# Patient Record
Sex: Male | Born: 1975 | Hispanic: Yes | Marital: Single | Smoking: Never smoker
Health system: Southern US, Community
[De-identification: ages and names within clinical notes are randomized; demographics above are authoritative.]

## PROBLEM LIST (undated history)

## (undated) DIAGNOSIS — K219 Gastro-esophageal reflux disease without esophagitis: Secondary | ICD-10-CM

## (undated) DIAGNOSIS — Z5189 Encounter for other specified aftercare: Secondary | ICD-10-CM

## (undated) DIAGNOSIS — C801 Malignant (primary) neoplasm, unspecified: Secondary | ICD-10-CM

## (undated) HISTORY — DX: Encounter for other specified aftercare: Z51.89

## (undated) HISTORY — DX: Malignant (primary) neoplasm, unspecified: C80.1

## (undated) NOTE — *Deleted (*Deleted)
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 Mid to lower esophageal mass along an 8 cm segment, large endoluminal component, strongly favoring esophageal malignancy. Borderline enlarged AP window lymph node. No findings of distant metastatic disease. 2. Trace bilateral pleural effusions. 3. Diffuse hepatic steatosis. 4. Cholelithiasis. 5. Fatty spermatic cords likely due to small bilateral indirect inguinal hernias.   08/11/2019 Procedure   EGD by Dr Leone Payor  IMPRESSION - Partially obstructing, likely malignant esophageal tumor was found in the upper third of the esophagus and in the middle third of the esophagus. Biopsied. Very large and long - difficult but able to advance scope by this lesion - await CT for better length estimate - Normal stomach. - Normal examined duodenum.   08/11/2019 Initial Biopsy   FINAL MICROSCOPIC DIAGNOSIS:   A. ESOPHAGUS, UPPER, BIOPSY:  - Squamous cell carcinoma.    08/18/2019 Cancer Staging   Staging form: Esophagus - Squamous Cell Carcinoma, AJCC 8th Edition - Clinical: Stage Unknown (cTX, cN1, cM0) - Signed by Malachy Mood, MD on 08/18/2019   08/29/2019 -   Chemotherapy   Concurrent chemoradiation with weekly CT for 6 weeks starting 08/29/19    08/29/2019 -  Radiation Therapy   Concurrent chemoradiation with Dr Mitzi Hansen starting 08/29/19      CURRENT THERAPY:  PENDING EGD on 11/28/19  INTERVAL HISTORY: *** Jimmy Hawkins is here for a follow up. He presents to the clinic alone.    REVIEW OF SYSTEMS:  *** Constitutional: Denies fevers, chills or abnormal weight loss Eyes: Denies blurriness of vision Ears, nose, mouth, throat, and face: Denies mucositis or sore throat Respiratory: Denies cough, dyspnea or wheezes Cardiovascular: Denies palpitation, chest discomfort or lower extremity swelling Gastrointestinal:  Denies nausea, heartburn or change in bowel habits Skin: Denies abnormal skin rashes Lymphatics: Denies new lymphadenopathy or easy bruising Neurological:Denies numbness, tingling or new weaknesses Behavioral/Psych: Mood is stable, no new changes  All other systems were reviewed with the patient and are negative.  MEDICAL HISTORY:  Past Medical History:  Diagnosis Date  . GERD (gastroesophageal reflux disease)   . Hematemesis with nausea 08/10/2019    SURGICAL HISTORY: Past Surgical History:  Procedure Laterality Date  . BIOPSY  08/11/2019   Procedure: BIOPSY;  Surgeon: Iva Boop, MD;  Location: [facility] ENDOSCOPY;  Service: Endoscopy;;  . ESOPHAGOGASTRODUODENOSCOPY  08/11/2019  . ESOPHAGOGASTRODUODENOSCOPY (EGD) WITH PROPOFOL N/A 08/11/2019   Procedure: ESOPHAGOGASTRODUODENOSCOPY (EGD) WITH PROPOFOL;  Surgeon: Iva Boop, MD;  Location: [facility] ENDOSCOPY;  Service: Endoscopy;  Laterality: N/A;    I have reviewed the social history and family history with the patient and they are unchanged from previous note.  ALLERGIES:  has No Known Allergies.  MEDICATIONS:  Current Outpatient Medications  Medication Sig Dispense Refill  . Ferrous Sulfate (IRON) 325 (65 Fe) MG TABS Take 1 tablet (325 mg total) by mouth 2  (two) times daily. 30 tablet 1  . HYDROcodone-acetaminophen (HYCET) 7.5-325 mg/15 ml solution Take 15 mLs by mouth every 6 (six) hours as needed for moderate pain. 473 mL 0  . omeprazole (PRILOSEC) 40 MG capsule Take 1 capsule (40 mg total) by mouth daily. 30 capsule 2  . ondansetron (ZOFRAN) 8 MG tablet Take 1 tablet (8 mg total) by mouth 2 (two) times daily as needed for refractory nausea / vomiting. Start on day 3 after chemo. 30 tablet 1  . pantoprazole (PROTONIX) 40 MG tablet Take 1 tablet (40 mg total) by mouth 2 (two) times daily. 60 tablet 1  . prochlorperazine (COMPAZINE) 10 MG tablet TAKE 1 TABLET BY MOUTH EVERY 6 HOURS AS NEEDED FOR NAUSEA AND/OR VOMITING 30 tablet 0  . sucralfate (CARAFATE) 1 g tablet Take 1 tablet (1 g total) by mouth 4 (four) times daily. Dissolve each tablet in 15 cc water before use. 120 tablet 2   No current facility-administered medications for this visit.    PHYSICAL EXAMINATION: ECOG PERFORMANCE STATUS: {CHL ONC ECOG PS:[id number]}  There were no vitals filed for this visit. There were no vitals filed for this visit. *** GENERAL:alert, no distress and comfortable SKIN: skin color, texture, turgor are normal, no rashes or significant lesions EYES: normal, Conjunctiva are pink and non-injected, sclera clear {OROPHARYNX:no exudate, no erythema and lips, buccal mucosa, and tongue normal}  NECK: supple, thyroid normal size, non-tender, without nodularity LYMPH:  no palpable lymphadenopathy in the cervical, axillary {or inguinal} LUNGS: clear to auscultation and percussion with normal breathing effort HEART: regular rate & rhythm and no murmurs and no lower extremity edema ABDOMEN:abdomen soft, non-tender and normal bowel sounds Musculoskeletal:no cyanosis of digits and no clubbing  NEURO: alert & oriented x 3 with fluent speech, no focal motor/sensory deficits  LABORATORY DATA:  I have reviewed the data as listed CBC Latest Ref Rng & Units 10/17/2019  10/04/2019 09/26/2019  WBC 4.0 - 10.5 K/uL 2.3(L) 2.4(L) 2.0(L)  Hemoglobin 13.0 - 17.0 g/dL 10.6(L) 10.6(L) 10.9(L)  Hematocrit 39 - 52 % 31.5(L) 31.4(L) 32.9(L)  Platelets 150 - 400 K/uL 187 183 152     CMP Latest Ref Rng & Units 10/17/2019 10/04/2019 09/26/2019  Glucose 70 - 99 mg/dL 161(W) 960(A) 540(J)  BUN 6 - 20 mg/dL <8(J) 10 5(L)  Creatinine 0.61 - 1.24 mg/dL 1.91 4.78 2.95  Sodium 135 - 145 mmol/L 135 134(L) 135  Potassium 3.5 - 5.1 mmol/L 3.9 3.6 3.7  Chloride 98 - 111 mmol/L 104 102 102  CO2 22 - 32 mmol/L 25 25 25   Calcium 8.9 - 10.3 mg/dL 8.9 9.1 9.7  Total Protein 6.5 - 8.1 g/dL 6.5 6.7 6.7  Total Bilirubin 0.3 - 1.2 mg/dL 0.4 0.6 0.5  Alkaline Phos 38 - 126 U/L 74 55 56  AST 15 - 41 U/L 18 22 12(L)  ALT 0 - 44 U/L 11 12 8       RADIOGRAPHIC STUDIES: I have personally reviewed the radiological images as listed and agreed with the findings in the report. NM PET Image Initial (PI) Skull Base To Thigh  Result Date: 11/11/2019 CLINICAL DATA:  Initial treatment strategy for esophageal cancer. EXAM: NUCLEAR MEDICINE PET SKULL BASE TO THIGH TECHNIQUE: 8.03 mCi F-18 FDG was injected intravenously. Full-ring PET imaging was performed from the skull base to thigh after the radiotracer. CT  data was obtained and used for attenuation correction and anatomic localization. Fasting blood glucose: 113 mg/dl COMPARISON:  CT CAP 16/10/9602 FINDINGS: Mediastinal blood pool activity: SUV max 2.23 Liver activity: SUV max NA NECK: No hypermetabolic lymph nodes in the neck. Incidental CT findings: none CHEST: No hypermetabolic mediastinal or hilar nodes. No suspicious pulmonary nodules on the CT scan. There is diffuse abnormal FDG uptake is identified corresponding to circumferential wall thickening of the distal half of the thoracic esophagus. This extends from the carina to just above the GE junction. SUV max is equal to 5.9. Incidental CT findings: none ABDOMEN/PELVIS: No abnormal tracer uptake  identified within the liver, pancreas, or spleen. Normal appearance of the adrenal glands. No FDG avid lymph nodes identified. Incidental CT findings: Gallstone. SKELETON: No focal hypermetabolic activity to suggest skeletal metastasis. Decreased radiotracer uptake is identified within the mid and lower thoracic spine likely reflecting marrow changes secondary to external beam radiation to the esophagus. Incidental CT findings: none IMPRESSION: 1. Again seen is a long segment of FDG avid wall thickening of the distal half of the thoracic esophagus compatible with known esophageal neoplasm. 2. No signs of FDG avid nodal or distant metastatic disease. Electronically Signed   By: Signa Kell M.D.   On: 11/11/2019 10:56     ASSESSMENT & PLAN:  Jimmy Hawkins is a 16 y.o. male with     1.[name], cTxN1M0 -Hewas diagnosed in 07/2019 withpresented withSquamous Cell Carcinoma as seen on EGD.08/11/19 CT CAP found him to havemidesophageal mass and enlarged AP window node, no distant metastasis.  -Ipreviouslydiscussed treatment options for locally advanced esophageal cancer, including neoadjuvant concurrent chemoradiation, followed by esophagectomy, and adjuvant immunotherapy.  -He completed 6 weeks of concurrent chemoRT withweekly Carboplatin and Taxol 08/29/19-10/06/19 *** -We discussed his PET from 11/11/19 which showed ***     2.Moderate anemia secondary to GI bleeding and probable iron deficiency -He has hadrecurrentNausea and vomiting withhematemesissince 01/2019.  -He required blood transfusion on 08/15/19 and IV Feraheme on 7/16/21and 08/22/19. -Continue oral iron. -anemia overall improved   3.Heavy alcohol user -He has stopped alcohol drinkingsince cancer diagnosis. Per his sister, he still has craving for alcohol, I will refer him to Child psychotherapist.  4. Dysphagia, Nutrition, Weight Loss, Abdominal pain, Chest discomfort  from RT -He reports progressive dysphagia with solid food for the past months, he is currently on soft diet.  -He is still tolerating oral soft dietbut with more dysphagia and odynophagia, he has lost 5 lbs since last week -Hecontinues to f/u with Dietician. -He had progressive chest discomfortweeks 3-5 of ccRT,worsens when supine. 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 -He has met withfinancial advocate Shauna and Tressia Miners he has grant assistance with medications.   Ipreviouslydiscussed for grant use he needs to use [facility].    PLAN: ***   No problem-specific Assessment & Plan notes found for this encounter.   No orders of the defined types were placed in this encounter.  All questions were answered. The patient knows to call the clinic with any problems, questions or concerns. No barriers to learning was detected. The total time spent in the appointment was {CHL ONC TIME VISIT - VWUJW:[id number]}.     Delphina Cahill 11/11/2019   Rogelia Rohrer, am acting as scribe for Malachy Mood, MD.   {Add scribe attestation statement}

---

## 2009-07-13 ENCOUNTER — Inpatient Hospital Stay: Payer: Self-pay | Admitting: Internal Medicine

## 2019-01-28 DIAGNOSIS — R252 Cramp and spasm: Secondary | ICD-10-CM

## 2019-01-28 HISTORY — DX: Cramp and spasm: R25.2

## 2019-02-17 ENCOUNTER — Encounter: Payer: Self-pay | Admitting: Emergency Medicine

## 2019-02-17 ENCOUNTER — Telehealth: Payer: Self-pay | Admitting: Emergency Medicine

## 2019-02-17 ENCOUNTER — Other Ambulatory Visit: Payer: Self-pay

## 2019-02-17 ENCOUNTER — Ambulatory Visit
Admission: EM | Admit: 2019-02-17 | Discharge: 2019-02-17 | Disposition: A | Discharge status: Home / Self Care | Payer: Self-pay

## 2019-02-17 DIAGNOSIS — F1010 Alcohol abuse, uncomplicated: Secondary | ICD-10-CM

## 2019-02-17 DIAGNOSIS — Z711 Person with feared health complaint in whom no diagnosis is made: Secondary | ICD-10-CM

## 2019-02-17 NOTE — ED Notes (Signed)
Patient able to ambulate independently  

## 2019-02-17 NOTE — ED Triage Notes (Signed)
Pt presents to [facility] for assessment after having beer with shrimp on Saturday.  States he felt suddenly nauseous, sweaty, and he vomited.  States it was full of blood.  States he threw up again after that and it was dark/coffe-ground.  Patient c/o abdominal pain x 3 days after, but resolved Tuesday evening.  Denies abdominal pain at this time.  Denies vomiting since episode on Saturday.  Patient states he drinks alcohol every day.

## 2019-02-17 NOTE — ED Provider Notes (Signed)
[facility]    CSN: [id number] Arrival date & time: 02/17/19  1125      History   Chief Complaint Chief Complaint  Patient presents with  . Abdominal Pain    HPI Jimmy Hawkins is a 44 y.o. male without significant medical history presenting for now resolved episode of bloody emesis on Sunday.  Translation provided via video [place].  States that he ate shrimp Saturday.  Had single episode of emesis without nausea with "a little bit of blood ".  States he had a few bowel movements with melena: Last bowel movement with melena was Monday afternoon; patient has been having "normal "bowel movement since.  Last bowel movement this morning without blood, blood in stool/on toilet paper, pain.  Patient did have generalized abdominal pain around time of emesis and melena, though has not had any since.  Patient states "everything has been normal since Monday ".  Of note patient does endorse alcohol use: 6 beers daily.  Denies history of seizures when maintaining abstinence.  Patient states last drink was yesterday: "I only had one ".  Patient denies headache, chest pain, tremors, difficulty breathing, abdominal pain.  No nausea or vomiting today.  Patient has been without fever since symptom onset.  Of note, patient does not have PCP.    History reviewed. No pertinent past medical history.  There are no problems to display for this patient.   History reviewed. No pertinent surgical history.     Home Medications    Prior to Admission medications   Not on File    Family History Family History  Problem Relation Age of Onset  . Stomach cancer Mother   . Healthy Father     Social History Social History   Tobacco Use  . Smoking status: Never Smoker  . Smokeless tobacco: Never Used  Substance Use Topics  . Alcohol use: Yes    Alcohol/week: 6.0 standard drinks    Types: 6 Cans of beer per week  . Drug use: Never     Allergies   Patient has no known  allergies.   Review of Systems As per HPI   Physical Exam Triage Vital Signs ED Triage Vitals  Enc Vitals Group     BP 02/17/19 1135 (!) 145/89     Pulse Rate 02/17/19 1135 99     Resp 02/17/19 1135 18     Temp 02/17/19 1135 98 F (36.7 C)     Temp Source 02/17/19 1135 Temporal     SpO2 02/17/19 1135 99 %     Weight --      Height --      Head Circumference --      Peak Flow --      Pain Score 02/17/19 1137 0     Pain Loc --      Pain Edu? --      Excl. in [place]? --    No data found.  Updated Vital Signs BP (!) 145/89 (BP Location: Left Arm)   Pulse 99   Temp 98 F (36.7 C) (Temporal)   Resp 18   SpO2 99%   Visual Acuity Right Eye Distance:   Left Eye Distance:   Bilateral Distance:    Right Eye Near:   Left Eye Near:    Bilateral Near:     Physical Exam Constitutional:      General: He is not in acute distress.    Appearance: He is well-developed. He is not ill-appearing.  HENT:     Head: Normocephalic and atraumatic.     Mouth/Throat:     Mouth: Mucous membranes are moist.     Pharynx: Oropharynx is clear. No pharyngeal swelling or oropharyngeal exudate.     Comments: Trachea midline, negative JVD Eyes:     General: No scleral icterus.    Pupils: Pupils are equal, round, and reactive to light.  Cardiovascular:     Rate and Rhythm: Normal rate and regular rhythm.     Heart sounds: No murmur. No gallop.   Pulmonary:     Effort: Pulmonary effort is normal. No respiratory distress.     Breath sounds: No wheezing or rales.  Abdominal:     General: Abdomen is flat. Bowel sounds are normal. There is no distension or abdominal bruit.     Palpations: Abdomen is soft. There is no splenomegaly.     Tenderness: There is no abdominal tenderness. There is no right CVA tenderness, left CVA tenderness, guarding or rebound. Negative signs include Murphy's sign, Rovsing's sign and McBurney's sign.  Skin:    Capillary Refill: Capillary refill takes less than 2  seconds.     Coloration: Skin is not cyanotic, jaundiced, mottled or pale.     Findings: No rash.  Neurological:     General: No focal deficit present.     Mental Status: He is alert and oriented to person, place, and time.      UC Treatments / Results  Labs (all labs ordered are listed, but only abnormal results are displayed) Labs Reviewed - No data to display  EKG   Radiology No results found.  Procedures Procedures (including critical care time)  Medications Ordered in UC Medications - No data to display  Initial Impression / Assessment and Plan / UC Course  I have reviewed the triage vital signs and the nursing notes.  Pertinent labs & imaging results that were available during my care of the patient were reviewed by me and considered in my medical decision making (see chart for details).    I have reviewed the triage vital signs and the nursing notes.  All pertinent labs & imaging results that were available during my care of the patient were reviewed by me and considered in my medical decision making (see chart for details).  Patient afebrile, nontoxic and without symptoms currently.  Melena appears to have resolved.  Discussed that single episode of blood in emesis given history of excessive alcohol use could be caused by esophageal varices and requires further evaluation and what can be provided in UC setting: Not emergent at this time.  Given primary care contact information to call and schedule appointment.  Return precautions discussed, patient verbalized understanding and is agreeable to plan. Final Clinical Impressions(s) / UC Diagnoses   Final diagnoses:  Worried well  Alcohol abuse     Discharge Instructions     Important to reduce alcohol use as this could be contributing to symptoms.    ED Prescriptions    None     PDMP not reviewed this encounter.   Hall[place], [place], [place] 02/17/19 1211

## 2019-02-17 NOTE — Discharge Instructions (Addendum)
Important to reduce alcohol use as this could be contributing to symptoms.

## 2019-02-17 NOTE — Telephone Encounter (Signed)
Patient's family member came to lobby requesting information about patient's visit.  This RN explained that we would need patient's verbal permission, as well as the APP who saw him to be involved, as this RN was not involved in his discharge.  Went to get aPP, and when we returned family was not in the lobby.  We walked to parking lot to look for patient/family, and could not find anyone.

## 2019-04-28 DIAGNOSIS — C159 Malignant neoplasm of esophagus, unspecified: Secondary | ICD-10-CM

## 2019-04-28 HISTORY — DX: Malignant neoplasm of esophagus, unspecified: C15.9

## 2019-08-10 ENCOUNTER — Other Ambulatory Visit: Payer: Self-pay

## 2019-08-10 ENCOUNTER — Inpatient Hospital Stay (HOSPITAL_COMMUNITY)
Admission: EM | Admit: 2019-08-10 | Discharge: 2019-08-12 | DRG: 375 | Disposition: A | Discharge status: Home / Self Care | Payer: Medicaid Other | Attending: Family Medicine | Admitting: Family Medicine

## 2019-08-10 ENCOUNTER — Encounter (HOSPITAL_COMMUNITY): Payer: Self-pay | Admitting: Emergency Medicine

## 2019-08-10 DIAGNOSIS — Z23 Encounter for immunization: Secondary | ICD-10-CM

## 2019-08-10 DIAGNOSIS — R569 Unspecified convulsions: Secondary | ICD-10-CM

## 2019-08-10 DIAGNOSIS — Z597 Insufficient social insurance and welfare support: Secondary | ICD-10-CM

## 2019-08-10 DIAGNOSIS — Z800 Family history of malignant neoplasm of digestive organs: Secondary | ICD-10-CM

## 2019-08-10 DIAGNOSIS — K8020 Calculus of gallbladder without cholecystitis without obstruction: Secondary | ICD-10-CM | POA: Diagnosis present

## 2019-08-10 DIAGNOSIS — K922 Gastrointestinal hemorrhage, unspecified: Secondary | ICD-10-CM

## 2019-08-10 DIAGNOSIS — D509 Iron deficiency anemia, unspecified: Secondary | ICD-10-CM | POA: Diagnosis present

## 2019-08-10 DIAGNOSIS — E871 Hypo-osmolality and hyponatremia: Secondary | ICD-10-CM | POA: Diagnosis present

## 2019-08-10 DIAGNOSIS — R1310 Dysphagia, unspecified: Secondary | ICD-10-CM | POA: Diagnosis present

## 2019-08-10 DIAGNOSIS — D62 Acute posthemorrhagic anemia: Secondary | ICD-10-CM | POA: Diagnosis present

## 2019-08-10 DIAGNOSIS — Z20822 Contact with and (suspected) exposure to covid-19: Secondary | ICD-10-CM | POA: Diagnosis present

## 2019-08-10 DIAGNOSIS — F1020 Alcohol dependence, uncomplicated: Secondary | ICD-10-CM | POA: Diagnosis present

## 2019-08-10 DIAGNOSIS — F1010 Alcohol abuse, uncomplicated: Secondary | ICD-10-CM

## 2019-08-10 DIAGNOSIS — C154 Malignant neoplasm of middle third of esophagus: Secondary | ICD-10-CM | POA: Diagnosis present

## 2019-08-10 DIAGNOSIS — C153 Malignant neoplasm of upper third of esophagus: Principal | ICD-10-CM | POA: Diagnosis present

## 2019-08-10 DIAGNOSIS — R55 Syncope and collapse: Secondary | ICD-10-CM

## 2019-08-10 DIAGNOSIS — C159 Malignant neoplasm of esophagus, unspecified: Secondary | ICD-10-CM | POA: Diagnosis present

## 2019-08-10 DIAGNOSIS — I959 Hypotension, unspecified: Secondary | ICD-10-CM | POA: Diagnosis present

## 2019-08-10 DIAGNOSIS — K920 Hematemesis: Secondary | ICD-10-CM

## 2019-08-10 HISTORY — DX: Gastro-esophageal reflux disease without esophagitis: K21.9

## 2019-08-10 HISTORY — DX: Hematemesis: K92.0

## 2019-08-10 LAB — CBC WITH DIFFERENTIAL/PLATELET
Abs Immature Granulocytes: 0.04 10*3/uL (ref 0.00–0.07)
Basophils Absolute: 0 10*3/uL (ref 0.0–0.1)
Basophils Relative: 0 %
Eosinophils Absolute: 0 10*3/uL (ref 0.0–0.5)
Eosinophils Relative: 0 %
HCT: 23 % — ABNORMAL LOW (ref 39.0–52.0)
Hemoglobin: 6.9 g/dL — CL (ref 13.0–17.0)
Immature Granulocytes: 0 %
Lymphocytes Relative: 7 %
Lymphs Abs: 0.7 10*3/uL (ref 0.7–4.0)
MCH: 25.2 pg — ABNORMAL LOW (ref 26.0–34.0)
MCHC: 30 g/dL (ref 30.0–36.0)
MCV: 83.9 fL (ref 80.0–100.0)
Monocytes Absolute: 1.1 10*3/uL — ABNORMAL HIGH (ref 0.1–1.0)
Monocytes Relative: 11 %
Neutro Abs: 7.7 10*3/uL (ref 1.7–7.7)
Neutrophils Relative %: 82 %
Platelets: 233 10*3/uL (ref 150–400)
RBC: 2.74 MIL/uL — ABNORMAL LOW (ref 4.22–5.81)
RDW: 15 % (ref 11.5–15.5)
WBC: 9.5 10*3/uL (ref 4.0–10.5)
nRBC: 0 % (ref 0.0–0.2)

## 2019-08-10 LAB — COMPREHENSIVE METABOLIC PANEL
ALT: 19 U/L (ref 0–44)
AST: 26 U/L (ref 15–41)
Albumin: 2.5 g/dL — ABNORMAL LOW (ref 3.5–5.0)
Alkaline Phosphatase: 53 U/L (ref 38–126)
Anion gap: 9 (ref 5–15)
BUN: 18 mg/dL (ref 6–20)
CO2: 23 mmol/L (ref 22–32)
Calcium: 8.5 mg/dL — ABNORMAL LOW (ref 8.9–10.3)
Chloride: 101 mmol/L (ref 98–111)
Creatinine, Ser: 0.58 mg/dL — ABNORMAL LOW (ref 0.61–1.24)
GFR calc Af Amer: >60 mL/min (ref >60)
GFR calc non Af Amer: >60 mL/min (ref >60)
Glucose, Bld: 138 mg/dL — ABNORMAL HIGH (ref 70–99)
Potassium: 4 mmol/L (ref 3.5–5.1)
Sodium: 133 mmol/L — ABNORMAL LOW (ref 135–145)
Total Bilirubin: 0.5 mg/dL (ref 0.3–1.2)
Total Protein: 6.9 g/dL (ref 6.5–8.1)

## 2019-08-10 LAB — SARS CORONAVIRUS 2 BY RT PCR (HOSPITAL ORDER, PERFORMED IN ~~LOC~~ HOSPITAL LAB): SARS Coronavirus 2: NEGATIVE

## 2019-08-10 LAB — LIPASE, BLOOD: Lipase: 26 U/L (ref 11–51)

## 2019-08-10 LAB — OCCULT BLOOD X 1 CARD TO LAB, STOOL: Fecal Occult Bld: POSITIVE — AB

## 2019-08-10 LAB — PREPARE RBC (CROSSMATCH)

## 2019-08-10 LAB — ABO/RH: ABO/RH(D): O POS

## 2019-08-10 LAB — PROTIME-INR
INR: 1.2 (ref 0.8–1.2)
Prothrombin Time: 14.5 seconds (ref 11.4–15.2)

## 2019-08-10 MED ORDER — ONDANSETRON HCL 4 MG/2ML IJ SOLN: 4.0000 mg | Freq: Four times a day (QID) | INTRAMUSCULAR | Status: DC | PRN

## 2019-08-10 MED ORDER — SODIUM CHLORIDE 0.9 % IV SOLN
50.0000 ug/h | INTRAVENOUS | Status: DC
  Administered 2019-08-10 – 2019-08-11 (×2): 50 ug/h via INTRAVENOUS
  Filled 2019-08-10 (×3): qty 1

## 2019-08-10 MED ORDER — THIAMINE HCL 100 MG/ML IJ SOLN
Freq: Once | INTRAVENOUS | Status: AC
  Filled 2019-08-10: qty 1000

## 2019-08-10 MED ORDER — SODIUM CHLORIDE 0.9% FLUSH
3.0000 mL | Freq: Two times a day (BID) | INTRAVENOUS | Status: DC
  Administered 2019-08-11 – 2019-08-12 (×3): 3 mL via INTRAVENOUS

## 2019-08-10 MED ORDER — ACETAMINOPHEN 650 MG RE SUPP: 650.0000 mg | Freq: Four times a day (QID) | RECTAL | Status: DC | PRN

## 2019-08-10 MED ORDER — LORAZEPAM 2 MG/ML IJ SOLN: 1.0000 mg | INTRAMUSCULAR | Status: DC | PRN

## 2019-08-10 MED ORDER — SODIUM CHLORIDE 0.9 % IV SOLN
80.0000 mg | Freq: Once | INTRAVENOUS | Status: AC
  Administered 2019-08-10: 80 mg via INTRAVENOUS
  Filled 2019-08-10: qty 80

## 2019-08-10 MED ORDER — ACETAMINOPHEN 325 MG PO TABS
650.0000 mg | ORAL_TABLET | Freq: Four times a day (QID) | ORAL | Status: DC | PRN
  Administered 2019-08-11: 650 mg via ORAL
  Filled 2019-08-10: qty 2

## 2019-08-10 MED ORDER — ONDANSETRON HCL 4 MG/2ML IJ SOLN
4.0000 mg | Freq: Once | INTRAMUSCULAR | Status: AC
  Administered 2019-08-10: 4 mg via INTRAVENOUS
  Filled 2019-08-10: qty 2

## 2019-08-10 MED ORDER — SODIUM CHLORIDE 0.9 % IV SOLN: 10.0000 mL/h | Freq: Once | INTRAVENOUS | Status: DC

## 2019-08-10 MED ORDER — SODIUM CHLORIDE 0.9 % IV BOLUS
1000.0000 mL | Freq: Once | INTRAVENOUS | Status: AC
  Administered 2019-08-10: 1000 mL via INTRAVENOUS

## 2019-08-10 MED ORDER — HYDROMORPHONE HCL 1 MG/ML IJ SOLN: 0.5000 mg | INTRAMUSCULAR | Status: DC | PRN

## 2019-08-10 MED ORDER — THIAMINE HCL 100 MG/ML IJ SOLN: 100.0000 mg | Freq: Every day | INTRAMUSCULAR | Status: DC

## 2019-08-10 MED ORDER — FOLIC ACID 1 MG PO TABS
1.0000 mg | ORAL_TABLET | Freq: Every day | ORAL | Status: DC
  Administered 2019-08-11 – 2019-08-12 (×2): 1 mg via ORAL
  Filled 2019-08-10 (×2): qty 1

## 2019-08-10 MED ORDER — SODIUM CHLORIDE 0.9 % IV SOLN
8.0000 mg/h | INTRAVENOUS | Status: DC
  Administered 2019-08-10 (×2): 8 mg/h via INTRAVENOUS
  Filled 2019-08-10 (×3): qty 80

## 2019-08-10 MED ORDER — LORAZEPAM 1 MG PO TABS: 1.0000 mg | ORAL_TABLET | ORAL | Status: DC | PRN

## 2019-08-10 MED ORDER — ADULT MULTIVITAMIN W/MINERALS CH
1.0000 | ORAL_TABLET | Freq: Every day | ORAL | Status: DC
  Administered 2019-08-11 – 2019-08-12 (×2): 1 via ORAL
  Filled 2019-08-10 (×2): qty 1

## 2019-08-10 MED ORDER — THIAMINE HCL 100 MG PO TABS
100.0000 mg | ORAL_TABLET | Freq: Every day | ORAL | Status: DC
  Administered 2019-08-11 – 2019-08-12 (×2): 100 mg via ORAL
  Filled 2019-08-10 (×2): qty 1

## 2019-08-10 MED ORDER — SODIUM CHLORIDE 0.9 % IV SOLN
1.0000 g | INTRAVENOUS | Status: DC
  Administered 2019-08-10: 1 g via INTRAVENOUS
  Filled 2019-08-10: qty 10
  Filled 2019-08-10: qty 1

## 2019-08-10 MED ORDER — ONDANSETRON HCL 4 MG PO TABS: 4.0000 mg | ORAL_TABLET | Freq: Four times a day (QID) | ORAL | Status: DC | PRN

## 2019-08-10 MED ORDER — OCTREOTIDE LOAD VIA INFUSION
25.0000 ug | Freq: Once | INTRAVENOUS | Status: AC
  Administered 2019-08-10: 25 ug via INTRAVENOUS
  Filled 2019-08-10: qty 13

## 2019-08-10 NOTE — Consult Note (Addendum)
[facility] Consult: 3:25 PM 08/10/2019  LOS: 0 days    Referring Provider: ED MD   DR Doristine Bosworth.   Primary Care Physician:  Patient, No Pcp Per Primary Gastroenterologist:  none  History and conversation with the patient was obtained using his niece as a Optometrist.  Reason for Consultation:  Hematemesis.  Hgb 6.9   HPI: Jimmy Hawkins is a 44 y.o. male.  Hx excessive ETOH.  Seizures, not on meds.   Patient drinks a lot of beer.  He drinks at least 8 tall beers (18 ounces?)  Daily and for couple of weeks taking 1200 mg ibuprofen to deal with pain in the epigastrium.  Several months of intermittent nausea and vomiting.  Has seen blood in his emesis as recently as 1 week ago when he had a large amount of blood, which resolved and he was vomiting up clear material in the next few days.  This morning he vomited blood in small quantity 6 or so times.  EMS noted 50 to 100 mL of bloody emesis while on route to the hospital.  Generally his appetite is good though he has had issues with dysphagia, food sticking in throat.    Stools are brown, last BM was yesterday. Patient had seizure-like activity this morning and was part of the reason EMS was contacted.  BP 129/70, HR 95 Hgb 6.9.  MCV 83.  Platelets  233.  INR 1.2.   Na 133.  BUN/Creat, LFTs normal.    Family history No history of liver disease, GI cancers, bleeding issues.  Social history Lives with his wife and 4 children ranging in age of late teens into their 66s.  He works Armed forces technical officer.  Alcohol intake as above.    History reviewed. No pertinent past medical history.  History reviewed. No pertinent surgical history.  Prior to Admission medications   Not on File    Scheduled Meds:  octreotide  25 mcg Intravenous Once     Infusions:  sodium chloride     octreotide  (SANDOSTATIN)    IV infusion     pantoprozole (PROTONIX) infusion 8 mg/hr (08/10/19 1521)   PRN Meds:    Allergies as of 08/10/2019   (No Known Allergies)    Family History  Problem Relation Age of Onset   Stomach cancer Mother    Healthy Father     Social History   Socioeconomic History   Marital status: Single    Spouse name: Not on file   Number of children: Not on file   Years of education: Not on file   Highest education level: Not on file  Occupational History   Not on file  Tobacco Use   Smoking status: Never Smoker   Smokeless tobacco: Never Used  Substance and Sexual Activity   Alcohol use: Yes    Alcohol/week: 6.0 standard drinks    Types: 6 Cans of beer per week    Comment:  6 drinks/day   Drug use: Never   Sexual activity: Not on file  Other Topics  Concern   Not on file  Social History Narrative   Not on file   Social Determinants of Health   Financial Resource Strain:    Difficulty of Paying Living Expenses:   Food Insecurity:    Worried About Charity fundraiser in the Last Year:    Arboriculturist in the Last Year:   Transportation Needs:    Film/video editor (Medical):    Lack of Transportation (Non-Medical):   Physical Activity:    Days of Exercise per Week:    Minutes of Exercise per Session:   Stress:    Feeling of Stress :   Social Connections:    Frequency of Communication with Friends and Family:    Frequency of Social Gatherings with Friends and Family:    Attends Religious Services:    Active Member of Clubs or Organizations:    Attends Music therapist:    Marital Status:   Intimate Partner Violence:    Fear of Current or Ex-Partner:    Emotionally Abused:    Physically Abused:    Sexually Abused:     REVIEW OF SYSTEMS: Constitutional: No weakness or fatigue ENT:  No nose bleeds Pulm: No shortness of breath or  cough CV:  No palpitations, no LE edema.  No angina GU:  No hematuria, no frequency GI: See HPI Heme: No significant bleeding or bruising other than the hematemesis. Transfusions: None ever Neuro: Possible seizure activity earlier today.  No headaches, no peripheral tingling or numbness Derm:  No itching, no rash or sores.  Endocrine:  No sweats or chills.  No polyuria or dysuria Immunization: Not queried. Travel:  None beyond local counties in last few months.    PHYSICAL EXAM: Vital signs in last 24 hours: Vitals:   08/10/19 1443 08/10/19 1502  BP: 134/72 129/70  Pulse: 93 94  Resp: 20 17  SpO2: 100% 100%   Wt Readings from Last 3 Encounters:  08/10/19 73.5 kg    General: Patient is alert, comfortable and slightly diaphoretic but looks overall well. Head: No facial asymmetry, swelling or signs of head trauma. Eyes: No scleral icterus, no conjunctival pallor.  EOMI. Ears: Not hard of hearing Nose: No congestion or discharge. Mouth: Oropharynx moist, pink, clear.  No blood in the mouth.  Tongue midline Neck: No JVD, no masses, no thyromegaly. Lungs: Clear bilaterally.  No labored breathing.  No cough Heart: RRR.  No MRG.  S1, S2 present Abdomen: Epigastric tenderness without guarding or rebound.  Active bowel sounds.  No HSM, masses, bruits, hernias.   Rectal: Deferred Musc/Skeltl: No joint redness, swelling or gross deformities Extremities: No CCE. Neurologic: Alert.  Oriented x3.  Fluid speech.  Moves all 4 limbs.  No involuntary movement, tremors, asterixis. Skin: No telangiectasia, sores, or bruising Tattoos: Professional quality tattoos on his torso and arm Nodes: No cervical adenopathy Psych: Calm, pleasant, cooperative, fluid speech.  Intake/Output from previous day: No intake/output data recorded. Intake/Output this shift: No intake/output data recorded.  LAB RESULTS: Recent Labs    08/10/19 1425  WBC 9.5  HGB 6.9*  HCT 23.0*  PLT 233   BMET Lab  Results  Component Value Date   NA 133 (L) 08/10/2019   K 4.0 08/10/2019   CL 101 08/10/2019   CO2 23 08/10/2019   GLUCOSE 138 (H) 08/10/2019   BUN 18 08/10/2019   CREATININE 0.58 (L) 08/10/2019   CALCIUM 8.5 (L) 08/10/2019   LFT Recent Labs  08/10/19 1425  PROT 6.9  ALBUMIN 2.5*  AST 26  ALT 19  ALKPHOS 53  BILITOT 0.5   PT/INR Lab Results  Component Value Date   INR 1.2 08/10/2019       IMPRESSION:   *    Hematemesis in patient with a few months of intermittent nausea vomiting and occasional hematemesis.  Epigastric pain. Using 6 Advil/day Rule out ulcer given the fact that he takes a lot of ibuprofen.  Rule out portal gastropathy or gastric/esophageal varices given the fact that he is a heavy drinker though status of alcoholic liver disease unknown. Encouraging is the fact that his platelets and INR are normal as is his BUN.  *    Normocytic anemia.  No prior labs for comparison.  *    Seizure.  Not due to alcohol withdrawal as he drank yesterday. History of seizures and not currently on any antiseizure meds.  *     Alcohol abuse.  *     Mild hyponatremia.    PLAN:     *    Plan EGD as soon as we can but impediments are currently the fact that he needs Covid testing prior to being able to pursue endoscopy Fortunately he is stable currently.  *   Agree with orders for Protonix drip as well as octreotide but these have yet to start.   Azucena Freed  08/10/2019, 3:25 PM Phone [phone]      [place] Attending   I have taken an interval history, reviewed the chart and examined the patient. I agree with the Advanced Practitioner's note, impression and recommendations.   My sense is that this is more likely a bleed from PUD or gastritis as opposed to varices - especially given the Advil use.  He needs blood, IVF and Covid test  Continue PPi and Octreotide Will add CTX prophylaxis due to suspected EtOH liver disease  [name], MD,  [facility] 08/10/2019 5:43 PM

## 2019-08-10 NOTE — Anesthesia Preprocedure Evaluation (Addendum)
Anesthesia Evaluation  Patient identified by MRN, date of birth, ID band Patient awake    Reviewed: Allergy & Precautions, NPO status , Patient's Chart, lab work & pertinent test results  Airway Mallampati: II  TM Distance: >3 FB Neck ROM: Full    Dental no notable dental hx. (+) Teeth Intact, Dental Advisory Given   Pulmonary neg pulmonary ROS,    Pulmonary exam normal breath sounds clear to auscultation       Cardiovascular Exercise Tolerance: Good negative cardio ROS Normal cardiovascular exam Rhythm:Regular Rate:Normal     Neuro/Psych PSYCHIATRIC DISORDERS negative neurological ROS     GI/Hepatic negative GI ROS, (+)       alcohol use,   Endo/Other  Morbid obesity  Renal/GU negative Renal ROS     Musculoskeletal negative musculoskeletal ROS (+)   Abdominal   Peds  Hematology  (+) Blood dyscrasia, anemia , Lab Results      Component                Value               Date                      WBC                      9.5                 08/10/2019                HGB                      6.9 (LL)            08/10/2019                HCT                      23.0 (L)            08/10/2019                MCV                      83.9                08/10/2019                PLT                      233                 08/10/2019              Anesthesia Other Findings   Reproductive/Obstetrics                            Anesthesia Physical Anesthesia Plan  ASA: II  Anesthesia Plan: MAC   Post-op Pain Management:    Induction:   PONV Risk Score and Plan: Treatment may vary due to age or medical condition  Airway Management Planned: Nasal Cannula and Natural Airway  Additional Equipment: None  Intra-op Plan:   Post-operative Plan:   Informed Consent: I have reviewed the patients History and Physical, chart, labs and discussed the procedure including the risks, benefits  and alternatives for the proposed anesthesia with the patient or  authorized representative who has indicated his/her understanding and acceptance.     Dental advisory given  Plan Discussed with:   Anesthesia Plan Comments:        Anesthesia Quick Evaluation

## 2019-08-10 NOTE — ED Provider Notes (Signed)
[facility] EMERGENCY DEPARTMENT Provider Note   CSN: [id number] Arrival date & time: 08/10/19  1330     History Chief Complaint  Patient presents with  . Hematemesis    Jimmy Hawkins is a 44 y.o. male.  He has no past medical history.  Drinks 8 beers a day.  Complaining of epigastric pain for a couple of days and today vomited bright red blood with clot.  After that had a syncopal event with some shaking concern for seizure.  Has vomited once more since then with EMS.  Was clammy and diaphoretic for them.  Currently complaining of 6 out of 10 subxiphoid abdominal pain.  He states he has vomited blood before.  Denies having had an endoscopy.  The history is provided by the patient. The history is limited by a language barrier. A language interpreter was used (niece).  GI Problem This is a recurrent problem. The current episode started 1 to 2 hours ago. The problem has not changed since onset.Associated symptoms include abdominal pain. Pertinent negatives include no chest pain, no headaches and no shortness of breath. Nothing aggravates the symptoms. Nothing relieves the symptoms. He has tried nothing for the symptoms. The treatment provided no relief.       No past medical history on file.  There are no problems to display for this patient.   No past surgical history on file.     Family History  Problem Relation Age of Onset  . Stomach cancer Mother   . Healthy Father     Social History   Tobacco Use  . Smoking status: Never Smoker  . Smokeless tobacco: Never Used  Substance Use Topics  . Alcohol use: Yes    Alcohol/week: 6.0 standard drinks    Types: 6 Cans of beer per week    Comment:  6 drinks/day  . Drug use: Never    Home Medications Prior to Admission medications   Not on File    Allergies    Patient has no known allergies.  Review of Systems   Review of Systems  Constitutional: Positive for diaphoresis. Negative for  fever.  HENT: Negative for sore throat.   Eyes: Negative for visual disturbance.  Respiratory: Negative for shortness of breath.   Cardiovascular: Negative for chest pain.  Gastrointestinal: Positive for abdominal pain, nausea and vomiting.  Genitourinary: Negative for dysuria.  Musculoskeletal: Negative for back pain.  Skin: Positive for pallor. Negative for rash.  Neurological: Positive for seizures and syncope. Negative for headaches.    Physical Exam Updated Vital Signs BP 118/69 (BP Location: Right Arm)   Pulse 88   Temp 99.5 F (37.5 C) (Oral)   Resp 18   Ht 5\' 6"  (1.676 m)   Wt 73.5 kg   SpO2 99%   BMI 26.15 kg/m   Physical Exam Vitals and nursing note reviewed.  Constitutional:      Appearance: Normal appearance. He is well-developed.  HENT:     Head: Normocephalic and atraumatic.     Mouth/Throat:     Mouth: Mucous membranes are moist.     Pharynx: Oropharynx is clear.  Eyes:     Conjunctiva/sclera: Conjunctivae normal.  Cardiovascular:     Rate and Rhythm: Normal rate and regular rhythm.     Heart sounds: No murmur heard.   Pulmonary:     Effort: Pulmonary effort is normal. No respiratory distress.     Breath sounds: Normal breath sounds.  Abdominal:  Palpations: Abdomen is soft.     Tenderness: There is abdominal tenderness (epigastric). There is no guarding or rebound.  Musculoskeletal:        General: Normal range of motion.     Cervical back: Neck supple.     Right lower leg: No edema.     Left lower leg: No edema.  Skin:    General: Skin is warm and dry.     Capillary Refill: Capillary refill takes less than 2 seconds.  Neurological:     General: No focal deficit present.     Mental Status: He is alert.     GCS: GCS eye subscore is 4. GCS verbal subscore is 5. GCS motor subscore is 6.     Sensory: No sensory deficit.     Motor: No weakness.     ED Results / Procedures / Treatments   Labs (all labs ordered are listed, but only abnormal  results are displayed) Labs Reviewed  COMPREHENSIVE METABOLIC PANEL - Abnormal; Notable for the following components:      Result Value   Sodium 133 (*)    Glucose, Bld 138 (*)    Creatinine, Ser 0.58 (*)    Calcium 8.5 (*)    Albumin 2.5 (*)    All other components within normal limits  CBC WITH DIFFERENTIAL/PLATELET - Abnormal; Notable for the following components:   RBC 2.74 (*)    Hemoglobin 6.9 (*)    HCT 23.0 (*)    MCH 25.2 (*)    Monocytes Absolute 1.1 (*)    All other components within normal limits  OCCULT BLOOD X 1 CARD TO LAB, STOOL - Abnormal; Notable for the following components:   Fecal Occult Bld POSITIVE (*)    All other components within normal limits  SARS CORONAVIRUS 2 BY RT PCR (HOSPITAL ORDER, [facility])  PROTIME-INR  LIPASE, BLOOD  HIV ANTIBODY (ROUTINE TESTING W REFLEX)  TSH  HEMOGLOBIN AND HEMATOCRIT, BLOOD  COMPREHENSIVE METABOLIC PANEL  MAGNESIUM  PHOSPHORUS  CBC  COMPREHENSIVE METABOLIC PANEL  CBC  POC OCCULT BLOOD, ED  TYPE AND SCREEN  PREPARE RBC (CROSSMATCH)  ABO/RH    EKG EKG Interpretation  Date/Time:  Wednesday August 10 2019 14:01:52 EDT Ventricular Rate:  88 PR Interval:    QRS Duration: 97 QT Interval:  356 QTC Calculation: 431 R Axis:     Text Interpretation: Confirmed by Aletta Edouard [phone]) on 08/10/2019 2:07:14 PM   Radiology No results found.  Procedures .Critical Care Performed by: Hayden Rasmussen, MD Authorized by: Hayden Rasmussen, MD   Critical care provider statement:    Critical care time (minutes):  45   Critical care time was exclusive of:  Separately billable procedures and treating other patients   Critical care was necessary to treat or prevent imminent or life-threatening deterioration of the following conditions:  Circulatory failure (gi bleed)   Critical care was time spent personally by me on the following activities:  Discussions with consultants, evaluation of  patient's response to treatment, examination of patient, ordering and performing treatments and interventions, ordering and review of laboratory studies, ordering and review of radiographic studies, pulse oximetry, re-evaluation of patient's condition, obtaining history from patient or surrogate, review of old charts and development of treatment plan with patient or surrogate   I assumed direction of critical care for this patient from another provider in my specialty: no     (including critical care time)  Medications Ordered in ED  Medications  pantoprazole (PROTONIX) 80 mg in sodium chloride 0.9 % 100 mL (0.8 mg/mL) infusion (8 mg/hr Intravenous New Bag/Given 08/10/19 1521)  0.9 %  sodium chloride infusion (0 mL/hr Intravenous Hold 08/10/19 1541)  octreotide (SANDOSTATIN) 2 mcg/mL load via infusion 25 mcg (25 mcg Intravenous Bolus from Bag 08/10/19 1540)    And  octreotide (SANDOSTATIN) 500 mcg in sodium chloride 0.9 % 250 mL (2 mcg/mL) infusion (50 mcg/hr Intravenous New Bag/Given 08/10/19 1540)  sodium chloride flush (NS) 0.9 % injection 3 mL (has no administration in time range)  sodium chloride 0.9 % 1,000 mL with thiamine 128 mg, folic acid 1 mg, multivitamins adult 10 mL infusion (has no administration in time range)  acetaminophen (TYLENOL) tablet 650 mg (has no administration in time range)    Or  acetaminophen (TYLENOL) suppository 650 mg (has no administration in time range)  HYDROmorphone (DILAUDID) injection 0.5-1 mg (has no administration in time range)  ondansetron (ZOFRAN) tablet 4 mg (has no administration in time range)    Or  ondansetron (ZOFRAN) injection 4 mg (has no administration in time range)  LORazepam (ATIVAN) tablet 1-4 mg (has no administration in time range)    Or  LORazepam (ATIVAN) injection 1-4 mg (has no administration in time range)  thiamine tablet 100 mg (has no administration in time range)    Or  thiamine (B-1) injection 100 mg (has no administration in  time range)  folic acid (FOLVITE) tablet 1 mg (has no administration in time range)  multivitamin with minerals tablet 1 tablet (has no administration in time range)  cefTRIAXone (ROCEPHIN) 1 g in sodium chloride 0.9 % 100 mL IVPB (has no administration in time range)  pantoprazole (PROTONIX) 80 mg in sodium chloride 0.9 % 100 mL IVPB (0 mg Intravenous Stopped 08/10/19 1458)  ondansetron (ZOFRAN) injection 4 mg (4 mg Intravenous Given 08/10/19 1439)  sodium chloride 0.9 % bolus 1,000 mL (0 mLs Intravenous Stopped 08/10/19 1533)    ED Course  I have reviewed the triage vital signs and the nursing notes.  Pertinent labs & imaging results that were available during my care of the patient were reviewed by me and considered in my medical decision making (see chart for details).  Clinical Course as of Aug 09 1821  Wed Aug 10, 2019  1345 EMS reported 50 to 100 cc of blood and clot.   [MB]  7867 Rectal exam done with nurse Whitney as chaperone. Normal tone no masses. Sample sent to lab for guaiac.   [MB]  6720 Patient's guaiac comes back as positive and hemoglobin low at 6.9.  Via the iPad interpreter consented the patient for blood transfusion and admission to the hospital.  Have ordered octreotide drip.  Awaiting callback from GI.   [MB]  1520 Discussed with Dr. Carlean Purl from low-power GI who will evaluate the patient for possible endoscopy.   [MB]  72 Discussed with Dr. Doristine Bosworth [facility] hospital[facility] who will evaluate the patient for admission.   [MB]  [place] Dr. Dominga Ferry, GI saw the patient and agrees with current management.  Will likely scope after transfusion.   [MB]    Clinical Course User Index [MB] Hayden Rasmussen, MD   MDM Rules/Calculators/A&P                         This patient complains of epigastric abdominal pain hematemesis; this involves an extensive number of treatment Options and is a complaint that carries with  it a high risk of complications and Morbidity. The  differential includes peptic ulcer disease, variceal bleed, Mallory-Weiss, anemia, shock, presyncope, coagulopathy  I ordered, reviewed and interpreted labs, which included CBC which shows a normal white count, low hemoglobin of 6.9-unclear baseline, normal platelets, chemistries with mildly low sodium and elevated glucose, normal BUN and creatinine, normal LFTs, INR slightly elevated at 1.2, stool occult positive I ordered medication IV fluids, IV Protonix, IV octreotide, blood transfusion  Additional history obtained from patient's niece Previous records obtained and reviewed in epic, none I consulted Dr. Carlean Purl GI and [facility] hospitalist Dr. Doristine Bosworth and discussed lab and imaging findings  Critical Interventions: Recognition and treatment of upper GI bleed  After the interventions stated above, I reevaluated the patient and found patient to minimally symptomatic.  Discussed and consented for blood transfusion.  Patient agreeable to admission.  All questions answered to the best my ability.  Final Clinical Impression(s) / ED Diagnoses Final diagnoses:  Hematemesis with nausea  Upper GI bleed  Syncope, unspecified syncope type  Seizure-like activity [facility])    Rx / DC Orders ED Discharge Orders    None       Hayden Rasmussen, MD 08/10/19 1830

## 2019-08-10 NOTE — ED Notes (Signed)
Patient's hgb 6.9. MD made aware.

## 2019-08-10 NOTE — H&P (Addendum)
History and Physical    Jimmy Hawkins YIF:[id number] DOB: 06/23/1975 DOA: 08/10/2019  PCP: Patient, No Pcp Per  Patient coming from: Home  I have personally briefly reviewed patient's old medical records in [place]  Chief Complaint: Bloody vomiting and abdominal pain  HPI: Jimmy Hawkins is a 44 y.o. male with medical history significant of alcohol abuse/dependence since about 24 years where he drinks about 8 beers per day presented to ED with complaint of abdominal pain and bloody vomiting.  Patient is Spanish-speaking.  His niece who is at the bedside is helping with interpretation.  According to patient, he started having upper abdominal pain since about 2 weeks ago.  This pain was initially intermittent and now has progressed in intensity and has been constant since last 2 to 3 days.  He started taking Advil about 6 tablets of 200 mg each on daily basis.  Early this morning at around 5 AM, he had one episode of bloody vomiting with blood clots.  EMS was called.  He had another episode in route with the EMS.  He was cold, clammy and diaphoretic and also had an episode of syncope after first episode.  According to him, he had 1 episode of hematemesis about 4 months ago and he sought medical attention in urgent care setting.  Currently his pain is 6 out of 10 and he does not have any other complaint.  ED Course: Upon arrival to ED, he was cold, clammy and diaphoretic and pale but his vitals were stable.  Hemoglobin was 6.9.  He was started on Protonix as well as octreotide drip. [facility] was consulted.  2 units of PRBC transfusion has been ordered.  Hospitalist service was consulted to admit the patient for further management.  Review of Systems: As per HPI otherwise negative.    History reviewed. No pertinent past medical history.  History reviewed. No pertinent surgical history.   reports that he has never smoked. He has never used smokeless tobacco. He reports  current alcohol use of about 6.0 standard drinks of alcohol per week. He reports that he does not use drugs.  No Known Allergies  Family History  Problem Relation Age of Onset  . Stomach cancer Mother   . Healthy Father     Prior to Admission medications   Medication Sig Start Date End Date Taking? Authorizing Provider  ibuprofen (ADVIL) 200 MG tablet Take 400 mg by mouth as needed for mild pain.   Yes [provider]    Physical Exam: Vitals:   08/10/19 1414 08/10/19 1443 08/10/19 1502  BP:  134/72 129/70  Pulse:  93 94  Resp:  20 17  SpO2:  100% 100%  Weight: 73.5 kg    Height: 5\' 6"  (1.676 m)      Constitutional: NAD, calm, comfortable Vitals:   08/10/19 1414 08/10/19 1443 08/10/19 1502  BP:  134/72 129/70  Pulse:  93 94  Resp:  20 17  SpO2:  100% 100%  Weight: 73.5 kg    Height: 5\' 6"  (1.676 m)     Eyes: PERRL, lids and conjunctivae normal ENMT: Mucous membranes are moist. Posterior pharynx clear of any exudate or lesions.Normal dentition.  Neck: normal, supple, no masses, no thyromegaly Respiratory: clear to auscultation bilaterally, no wheezing, no crackles. Normal respiratory effort. No accessory muscle use.  Cardiovascular: Regular rate and rhythm, no murmurs / rubs / gallops. No extremity edema. 2+ pedal pulses. No carotid bruits.  Abdomen: Epigastric tenderness,  no masses palpated. No hepatosplenomegaly. Bowel sounds positive.  Musculoskeletal: no clubbing / cyanosis. No joint deformity upper and lower extremities. Good ROM, no contractures. Normal muscle tone.  Skin: no rashes, lesions, ulcers. No induration Neurologic: CN 2-12 grossly intact. Sensation intact, DTR normal. Strength 5/5 in all 4.  Psychiatric: Normal judgment and insight. Alert and oriented x 3. Normal mood.    Labs on Admission: I have personally reviewed following labs and imaging studies  CBC: Recent Labs  Lab 08/10/19 1425  WBC 9.5  NEUTROABS 7.7  HGB 6.9*  HCT 23.0*    MCV 83.9  PLT 161   Basic Metabolic Panel: Recent Labs  Lab 08/10/19 1425  NA 133*  K 4.0  CL 101  CO2 23  GLUCOSE 138*  BUN 18  CREATININE 0.58*  CALCIUM 8.5*   GFR: Estimated Creatinine Clearance: 108.5 mL/min (A) (by C-G formula based on SCr of 0.58 mg/dL (L)). Liver Function Tests: Recent Labs  Lab 08/10/19 1425  AST 26  ALT 19  ALKPHOS 53  BILITOT 0.5  PROT 6.9  ALBUMIN 2.5*   Recent Labs  Lab 08/10/19 1425  LIPASE 26   No results for input(s): AMMONIA in the last 168 hours. Coagulation Profile: Recent Labs  Lab 08/10/19 1425  INR 1.2   Cardiac Enzymes: No results for input(s): CKTOTAL, CKMB, CKMBINDEX, TROPONINI in the last 168 hours. BNP (last 3 results) No results for input(s): PROBNP in the last 8760 hours. HbA1C: No results for input(s): HGBA1C in the last 72 hours. CBG: No results for input(s): GLUCAP in the last 168 hours. Lipid Profile: No results for input(s): CHOL, HDL, LDLCALC, TRIG, CHOLHDL, LDLDIRECT in the last 72 hours. Thyroid Function Tests: No results for input(s): TSH, T4TOTAL, FREET4, T3FREE, THYROIDAB in the last 72 hours. Anemia Panel: No results for input(s): VITAMINB12, FOLATE, FERRITIN, TIBC, IRON, RETICCTPCT in the last 72 hours. Urine analysis: No results found for: COLORURINE, APPEARANCEUR, LABSPEC, PHURINE, GLUCOSEU, HGBUR, BILIRUBINUR, KETONESUR, PROTEINUR, UROBILINOGEN, NITRITE, LEUKOCYTESUR  Radiological Exams on Admission: No results found.   Assessment/Plan Active Problems:   Upper GI bleeding   Syncope, vasovagal   Acute blood loss anemia   Hematemesis/upper GI bleed/acute blood loss anemia: Likely secondary to chronic alcoholism on top of taking Advil for last 2 weeks.  Continue Protonix and octreotide.  He will get 2 units of PRBC transfusion.  We will check his hemoglobin later today and transfuse if drops less than 7. [facility] to see.  They have been consulted already.  Will need EGD.  Syncope: Secondary  to hematemesis episode and likely hypotension.  Currently fine.  Chronic alcohol abuse: He tells me that he has been drinking since the age of 44.  He usually does not miss any day.  Drinks about 8 beers from morning to night.  Last drink was 9 PM last night.  No signs of withdrawal however I will start him on CIWA protocol with as needed Ativan and banana bag.  DVT prophylaxis: SCDs Start: 08/10/19 1545 Code Status: Full code Family Communication: Niece present at bedside.  Plan of care discussed with patient in length and she verbalized understanding and agreed with it. Disposition Plan: Likely home in next 1 to 2 days Consults called: GI Admission status: Observation   Status is: Observation  The patient remains OBS appropriate and will d/c before 2 midnights.  Dispo: The patient is from: Home              Anticipated d/c  is to: Home              Anticipated d/c date is: 1 day              Patient currently is not medically stable to d/c.   Darliss Cheney MD [facility]  08/10/2019, 4:06 PM  To contact the attending provider between 7A-7P or the covering provider during after hours 7P-7A, please log into the web site [email or web address]

## 2019-08-10 NOTE — ED Triage Notes (Signed)
Patient comes from home via [facility].EMS reports patient was vomiting large (50-100 cc of blood/clots). EMS also reports that family reported the patient had seizure activity after lying down on the floor. EMS reports the family reports he has a hx of seizures but is not treated for them. EMS reports that the pt reports he feels like he has something stuck  High in his throat.   Temp 98.8 on arrival

## 2019-08-10 NOTE — H&P (View-Only) (Signed)
[facility] Consult: 3:25 PM 08/10/2019  LOS: 0 days    Referring Provider: ED MD   DR Doristine Bosworth.   Primary Care Physician:  Patient, No Pcp Per Primary Gastroenterologist:  none  History and conversation with the patient was obtained using his niece as a Optometrist.  Reason for Consultation:  Hematemesis.  Hgb 6.9   HPI: Jimmy Hawkins is a 44 y.o. male.  Hx excessive ETOH.  Seizures, not on meds.   Patient drinks a Jimmy of beer.  He drinks at least 8 tall beers (18 ounces?)  Daily and for couple of weeks taking 1200 mg ibuprofen to deal with pain in the epigastrium.  Several months of intermittent nausea and vomiting.  Has seen blood in his emesis as recently as 1 week ago when he had a large amount of blood, which resolved and he was vomiting up clear material in the next few days.  This morning he vomited blood in small quantity 6 or so times.  EMS noted 50 to 100 mL of bloody emesis while on route to the hospital.  Generally his appetite is good though he has had issues with dysphagia, food sticking in throat.    Stools are brown, last BM was yesterday. Patient had seizure-like activity this morning and was part of the reason EMS was contacted.  BP 129/70, HR 95 Hgb 6.9.  MCV 83.  Platelets  233.  INR 1.2.   Na 133.  BUN/Creat, LFTs normal.    Family history No history of liver disease, GI cancers, bleeding issues.  Social history Lives with his wife and 4 children ranging in age of late teens into their 38s.  He works Armed forces technical officer.  Alcohol intake as above.    History reviewed. No pertinent past medical history.  History reviewed. No pertinent surgical history.  Prior to Admission medications   Not on File    Scheduled Meds: . octreotide  25 mcg Intravenous Once     Infusions: . sodium chloride    . octreotide  (SANDOSTATIN)    IV infusion    . pantoprozole (PROTONIX) infusion 8 mg/hr (08/10/19 1521)   PRN Meds:    Allergies as of 08/10/2019  . (No Known Allergies)    Family History  Problem Relation Age of Onset  . Stomach cancer Mother   . Healthy Father     Social History   Socioeconomic History  . Marital status: Single    Spouse name: Not on file  . Number of children: Not on file  . Years of education: Not on file  . Highest education level: Not on file  Occupational History  . Not on file  Tobacco Use  . Smoking status: Never Smoker  . Smokeless tobacco: Never Used  Substance and Sexual Activity  . Alcohol use: Yes    Alcohol/week: 6.0 standard drinks    Types: 6 Cans of beer per week    Comment:  6 drinks/day  . Drug use: Never  . Sexual activity: Not on file  Other Topics  Concern  . Not on file  Social History Narrative  . Not on file   Social Determinants of Health   Financial Resource Strain:   . Difficulty of Paying Living Expenses:   Food Insecurity:   . Worried About Charity fundraiser in the Last Year:   . Arboriculturist in the Last Year:   Transportation Needs:   . Film/video editor (Medical):   Marland Kitchen Lack of Transportation (Non-Medical):   Physical Activity:   . Days of Exercise per Week:   . Minutes of Exercise per Session:   Stress:   . Feeling of Stress :   Social Connections:   . Frequency of Communication with Friends and Family:   . Frequency of Social Gatherings with Friends and Family:   . Attends Religious Services:   . Active Member of Clubs or Organizations:   . Attends Archivist Meetings:   Marland Kitchen Marital Status:   Intimate Partner Violence:   . Fear of Current or Ex-Partner:   . Emotionally Abused:   Marland Kitchen Physically Abused:   . Sexually Abused:     REVIEW OF SYSTEMS: Constitutional: No weakness or fatigue ENT:  No nose bleeds Pulm: No shortness of breath or  cough CV:  No palpitations, no LE edema.  No angina GU:  No hematuria, no frequency GI: See HPI Heme: No significant bleeding or bruising other than the hematemesis. Transfusions: None ever Neuro: Possible seizure activity earlier today.  No headaches, no peripheral tingling or numbness Derm:  No itching, no rash or sores.  Endocrine:  No sweats or chills.  No polyuria or dysuria Immunization: Not queried. Travel:  None beyond local counties in last few months.    PHYSICAL EXAM: Vital signs in last 24 hours: Vitals:   08/10/19 1443 08/10/19 1502  BP: 134/72 129/70  Pulse: 93 94  Resp: 20 17  SpO2: 100% 100%   Wt Readings from Last 3 Encounters:  08/10/19 73.5 kg    General: Patient is alert, comfortable and slightly diaphoretic but looks overall well. Head: No facial asymmetry, swelling or signs of head trauma. Eyes: No scleral icterus, no conjunctival pallor.  EOMI. Ears: Not hard of hearing Nose: No congestion or discharge. Mouth: Oropharynx moist, pink, clear.  No blood in the mouth.  Tongue midline Neck: No JVD, no masses, no thyromegaly. Lungs: Clear bilaterally.  No labored breathing.  No cough Heart: RRR.  No MRG.  S1, S2 present Abdomen: Epigastric tenderness without guarding or rebound.  Active bowel sounds.  No HSM, masses, bruits, hernias.   Rectal: Deferred Musc/Skeltl: No joint redness, swelling or gross deformities Extremities: No CCE. Neurologic: Alert.  Oriented x3.  Fluid speech.  Moves all 4 limbs.  No involuntary movement, tremors, asterixis. Skin: No telangiectasia, sores, or bruising Tattoos: Professional quality tattoos on his torso and arm Nodes: No cervical adenopathy Psych: Calm, pleasant, cooperative, fluid speech.  Intake/Output from previous day: No intake/output data recorded. Intake/Output this shift: No intake/output data recorded.  LAB RESULTS: Recent Labs    08/10/19 1425  WBC 9.5  HGB 6.9*  HCT 23.0*  PLT 233   BMET Lab  Results  Component Value Date   NA 133 (L) 08/10/2019   K 4.0 08/10/2019   CL 101 08/10/2019   CO2 23 08/10/2019   GLUCOSE 138 (H) 08/10/2019   BUN 18 08/10/2019   CREATININE 0.58 (L) 08/10/2019   CALCIUM 8.5 (L) 08/10/2019   LFT Recent Labs  08/10/19 1425  PROT 6.9  ALBUMIN 2.5*  AST 26  ALT 19  ALKPHOS 53  BILITOT 0.5   PT/INR Lab Results  Component Value Date   INR 1.2 08/10/2019       IMPRESSION:   *    Hematemesis in patient with a few months of intermittent nausea vomiting and occasional hematemesis.  Epigastric pain. Using 6 Advil/day Rule out ulcer given the fact that he takes a Jimmy of ibuprofen.  Rule out portal gastropathy or gastric/esophageal varices given the fact that he is a heavy drinker though status of alcoholic liver disease unknown. Encouraging is the fact that his platelets and INR are normal as is his BUN.  *    Normocytic anemia.  No prior labs for comparison.  *    Seizure.  Not due to alcohol withdrawal as he drank yesterday. History of seizures and not currently on any antiseizure meds.  *     Alcohol abuse.  *     Mild hyponatremia.    PLAN:     *    Plan EGD as soon as we can but impediments are currently the fact that he needs Covid testing prior to being able to pursue endoscopy Fortunately he is stable currently.  *   Agree with orders for Protonix drip as well as octreotide but these have yet to start.   Azucena Freed  08/10/2019, 3:25 PM Phone [phone]      [place] Attending   I have taken an interval history, reviewed the chart and examined the patient. I agree with the Advanced Practitioner's note, impression and recommendations.   My sense is that this is more likely a bleed from PUD or gastritis as opposed to varices - especially given the Advil use.  He needs blood, IVF and Covid test  Continue PPi and Octreotide Will add CTX prophylaxis due to suspected EtOH liver disease  [name], MD,  [facility] 08/10/2019 5:43 PM

## 2019-08-11 ENCOUNTER — Encounter (HOSPITAL_COMMUNITY): Payer: Self-pay | Admitting: Family Medicine

## 2019-08-11 ENCOUNTER — Observation Stay (HOSPITAL_COMMUNITY): Payer: Medicaid Other | Admitting: Anesthesiology

## 2019-08-11 ENCOUNTER — Encounter (HOSPITAL_COMMUNITY)
Admission: EM | Disposition: A | Discharge status: Home / Self Care | Payer: Self-pay | Source: Home / Self Care | Attending: Family Medicine

## 2019-08-11 ENCOUNTER — Inpatient Hospital Stay (HOSPITAL_COMMUNITY): Payer: Medicaid Other

## 2019-08-11 DIAGNOSIS — R569 Unspecified convulsions: Secondary | ICD-10-CM | POA: Diagnosis present

## 2019-08-11 DIAGNOSIS — D509 Iron deficiency anemia, unspecified: Secondary | ICD-10-CM | POA: Diagnosis present

## 2019-08-11 DIAGNOSIS — R1310 Dysphagia, unspecified: Secondary | ICD-10-CM | POA: Diagnosis present

## 2019-08-11 DIAGNOSIS — C159 Malignant neoplasm of esophagus, unspecified: Secondary | ICD-10-CM | POA: Diagnosis present

## 2019-08-11 DIAGNOSIS — C153 Malignant neoplasm of upper third of esophagus: Secondary | ICD-10-CM | POA: Diagnosis present

## 2019-08-11 DIAGNOSIS — F1020 Alcohol dependence, uncomplicated: Secondary | ICD-10-CM | POA: Diagnosis present

## 2019-08-11 DIAGNOSIS — Z20822 Contact with and (suspected) exposure to covid-19: Secondary | ICD-10-CM | POA: Diagnosis present

## 2019-08-11 DIAGNOSIS — Z597 Insufficient social insurance and welfare support: Secondary | ICD-10-CM | POA: Diagnosis not present

## 2019-08-11 DIAGNOSIS — C154 Malignant neoplasm of middle third of esophagus: Secondary | ICD-10-CM | POA: Diagnosis present

## 2019-08-11 DIAGNOSIS — K922 Gastrointestinal hemorrhage, unspecified: Secondary | ICD-10-CM

## 2019-08-11 DIAGNOSIS — Z23 Encounter for immunization: Secondary | ICD-10-CM | POA: Diagnosis not present

## 2019-08-11 DIAGNOSIS — R55 Syncope and collapse: Secondary | ICD-10-CM | POA: Diagnosis present

## 2019-08-11 DIAGNOSIS — K8020 Calculus of gallbladder without cholecystitis without obstruction: Secondary | ICD-10-CM | POA: Diagnosis present

## 2019-08-11 DIAGNOSIS — I959 Hypotension, unspecified: Secondary | ICD-10-CM | POA: Diagnosis present

## 2019-08-11 DIAGNOSIS — E871 Hypo-osmolality and hyponatremia: Secondary | ICD-10-CM | POA: Diagnosis present

## 2019-08-11 DIAGNOSIS — Z800 Family history of malignant neoplasm of digestive organs: Secondary | ICD-10-CM | POA: Diagnosis not present

## 2019-08-11 DIAGNOSIS — D62 Acute posthemorrhagic anemia: Secondary | ICD-10-CM | POA: Diagnosis present

## 2019-08-11 HISTORY — PX: BIOPSY: SHX5522

## 2019-08-11 HISTORY — PX: ESOPHAGOGASTRODUODENOSCOPY: SHX1529

## 2019-08-11 HISTORY — PX: ESOPHAGOGASTRODUODENOSCOPY (EGD) WITH PROPOFOL: SHX5813

## 2019-08-11 LAB — MAGNESIUM: Magnesium: 1.7 mg/dL (ref 1.7–2.4)

## 2019-08-11 LAB — CBC
HCT: 25.6 % — ABNORMAL LOW (ref 39.0–52.0)
HCT: 27.5 % — ABNORMAL LOW (ref 39.0–52.0)
Hemoglobin: 7.7 g/dL — ABNORMAL LOW (ref 13.0–17.0)
Hemoglobin: 8.3 g/dL — ABNORMAL LOW (ref 13.0–17.0)
MCH: 25.1 pg — ABNORMAL LOW (ref 26.0–34.0)
MCH: 25.3 pg — ABNORMAL LOW (ref 26.0–34.0)
MCHC: 30.1 g/dL (ref 30.0–36.0)
MCHC: 30.2 g/dL (ref 30.0–36.0)
MCV: 83.4 fL (ref 80.0–100.0)
MCV: 83.8 fL (ref 80.0–100.0)
Platelets: 179 10*3/uL (ref 150–400)
Platelets: 207 10*3/uL (ref 150–400)
RBC: 3.07 MIL/uL — ABNORMAL LOW (ref 4.22–5.81)
RBC: 3.28 MIL/uL — ABNORMAL LOW (ref 4.22–5.81)
RDW: 15.1 % (ref 11.5–15.5)
RDW: 15 % (ref 11.5–15.5)
WBC: 5.4 10*3/uL (ref 4.0–10.5)
WBC: 5.7 10*3/uL (ref 4.0–10.5)
nRBC: 0 % (ref 0.0–0.2)
nRBC: 0 % (ref 0.0–0.2)

## 2019-08-11 LAB — CBC WITH DIFFERENTIAL/PLATELET
Abs Immature Granulocytes: 0.04 10*3/uL (ref 0.00–0.07)
Basophils Absolute: 0 10*3/uL (ref 0.0–0.1)
Basophils Relative: 0 %
Eosinophils Absolute: 0.1 10*3/uL (ref 0.0–0.5)
Eosinophils Relative: 2 %
HCT: 26.4 % — ABNORMAL LOW (ref 39.0–52.0)
Hemoglobin: 8.2 g/dL — ABNORMAL LOW (ref 13.0–17.0)
Immature Granulocytes: 1 %
Lymphocytes Relative: 19 %
Lymphs Abs: 1.2 10*3/uL (ref 0.7–4.0)
MCH: 25.6 pg — ABNORMAL LOW (ref 26.0–34.0)
MCHC: 31.1 g/dL (ref 30.0–36.0)
MCV: 82.5 fL (ref 80.0–100.0)
Monocytes Absolute: 0.8 10*3/uL (ref 0.1–1.0)
Monocytes Relative: 12 %
Neutro Abs: 4.2 10*3/uL (ref 1.7–7.7)
Neutrophils Relative %: 66 %
Platelets: 191 10*3/uL (ref 150–400)
RBC: 3.2 MIL/uL — ABNORMAL LOW (ref 4.22–5.81)
RDW: 14.8 % (ref 11.5–15.5)
WBC: 6.4 10*3/uL (ref 4.0–10.5)
nRBC: 0 % (ref 0.0–0.2)

## 2019-08-11 LAB — COMPREHENSIVE METABOLIC PANEL
ALT: 19 U/L (ref 0–44)
ALT: 20 U/L (ref 0–44)
AST: 29 U/L (ref 15–41)
AST: 31 U/L (ref 15–41)
Albumin: 2.2 g/dL — ABNORMAL LOW (ref 3.5–5.0)
Albumin: 2.2 g/dL — ABNORMAL LOW (ref 3.5–5.0)
Alkaline Phosphatase: 43 U/L (ref 38–126)
Alkaline Phosphatase: 43 U/L (ref 38–126)
Anion gap: 8 (ref 5–15)
Anion gap: 9 (ref 5–15)
BUN: 12 mg/dL (ref 6–20)
BUN: 12 mg/dL (ref 6–20)
CO2: 22 mmol/L (ref 22–32)
CO2: 22 mmol/L (ref 22–32)
Calcium: 7.7 mg/dL — ABNORMAL LOW (ref 8.9–10.3)
Calcium: 7.9 mg/dL — ABNORMAL LOW (ref 8.9–10.3)
Chloride: 106 mmol/L (ref 98–111)
Chloride: 106 mmol/L (ref 98–111)
Creatinine, Ser: 0.64 mg/dL (ref 0.61–1.24)
Creatinine, Ser: 0.64 mg/dL (ref 0.61–1.24)
GFR calc Af Amer: >60 mL/min (ref >60)
GFR calc Af Amer: >60 mL/min (ref >60)
GFR calc non Af Amer: >60 mL/min (ref >60)
GFR calc non Af Amer: >60 mL/min (ref >60)
Glucose, Bld: 127 mg/dL — ABNORMAL HIGH (ref 70–99)
Glucose, Bld: 127 mg/dL — ABNORMAL HIGH (ref 70–99)
Potassium: 4 mmol/L (ref 3.5–5.1)
Potassium: 4 mmol/L (ref 3.5–5.1)
Sodium: 136 mmol/L (ref 135–145)
Sodium: 137 mmol/L (ref 135–145)
Total Bilirubin: 0.8 mg/dL (ref 0.3–1.2)
Total Bilirubin: 0.9 mg/dL (ref 0.3–1.2)
Total Protein: 6.1 g/dL — ABNORMAL LOW (ref 6.5–8.1)
Total Protein: 6.1 g/dL — ABNORMAL LOW (ref 6.5–8.1)

## 2019-08-11 LAB — BPAM RBC
Blood Product Expiration Date: 202108162359
Blood Product Expiration Date: 202108162359
ISSUE DATE / TIME: 202107141707
ISSUE DATE / TIME: 202107142123
Unit Type and Rh: 5100
Unit Type and Rh: 5100

## 2019-08-11 LAB — PHOSPHORUS: Phosphorus: 3.3 mg/dL (ref 2.5–4.6)

## 2019-08-11 LAB — TYPE AND SCREEN
ABO/RH(D): O POS
Antibody Screen: NEGATIVE
Unit division: 0
Unit division: 0

## 2019-08-11 LAB — IRON AND TIBC
Iron: 18 ug/dL — ABNORMAL LOW (ref 45–182)
Saturation Ratios: 5 % — ABNORMAL LOW (ref 17.9–39.5)
TIBC: 389 ug/dL (ref 250–450)
UIBC: 371 ug/dL

## 2019-08-11 LAB — HIV ANTIBODY (ROUTINE TESTING W REFLEX): HIV Screen 4th Generation wRfx: NONREACTIVE

## 2019-08-11 LAB — TSH: TSH: 0.357 u[IU]/mL (ref 0.350–4.500)

## 2019-08-11 LAB — FERRITIN: Ferritin: 15 ng/mL — ABNORMAL LOW (ref 24–336)

## 2019-08-11 SURGERY — ESOPHAGOGASTRODUODENOSCOPY (EGD) WITH PROPOFOL
Anesthesia: Monitor Anesthesia Care

## 2019-08-11 MED ORDER — PROPOFOL 10 MG/ML IV BOLUS
INTRAVENOUS | Status: DC | PRN
  Administered 2019-08-11: 100 mg via INTRAVENOUS

## 2019-08-11 MED ORDER — IOHEXOL 300 MG/ML  SOLN
100.0000 mL | Freq: Once | INTRAMUSCULAR | Status: AC | PRN
  Administered 2019-08-11: 100 mL via INTRAVENOUS

## 2019-08-11 MED ORDER — PROPOFOL 500 MG/50ML IV EMUL
INTRAVENOUS | Status: DC | PRN
  Administered 2019-08-11: 100 ug/kg/min via INTRAVENOUS

## 2019-08-11 MED ORDER — PNEUMOCOCCAL VAC POLYVALENT 25 MCG/0.5ML IJ INJ
0.5000 mL | INJECTION | INTRAMUSCULAR | Status: AC
  Administered 2019-08-12: 0.5 mL via INTRAMUSCULAR
  Filled 2019-08-11: qty 0.5

## 2019-08-11 MED ORDER — LACTATED RINGERS IV SOLN
INTRAVENOUS | Status: AC | PRN
  Administered 2019-08-11: 1000 mL via INTRAVENOUS

## 2019-08-11 MED ORDER — LACTATED RINGERS IV SOLN: INTRAVENOUS | Status: DC | PRN

## 2019-08-11 SURGICAL SUPPLY — 15 items

## 2019-08-11 NOTE — Transfer of Care (Signed)
Immediate Anesthesia Transfer of Care Note  Patient: Jimmy Hawkins  Procedure(s) Performed: ESOPHAGOGASTRODUODENOSCOPY (EGD) WITH PROPOFOL (N/A ) BIOPSY  Patient Location: PACU  Anesthesia Type:MAC  Level of Consciousness: drowsy and patient cooperative  Airway & Oxygen Therapy: Patient Spontanous Breathing  Post-op Assessment: Report given to RN and Post -op Vital signs reviewed and stable  Post vital signs: Reviewed and stable  Last Vitals:  Vitals Value Taken Time  BP 128/82 08/11/19 0859  Temp    Pulse 85 08/11/19 0900  Resp 20 08/11/19 0900  SpO2 100 % 08/11/19 0900  Vitals shown include unvalidated device data.  Last Pain:  Vitals:   08/11/19 0859  TempSrc:   PainSc: 0-No pain      Patients Stated Pain Goal: 6 (37/09/64 3838)  Complications: No complications documented.

## 2019-08-11 NOTE — Progress Notes (Signed)
Dr.Feng will see the patient from Oncology stand point

## 2019-08-11 NOTE — Care Management (Signed)
Patient entered in [facility] And placed [facility] a on AVS .   Magdalen Spatz RN

## 2019-08-11 NOTE — Progress Notes (Signed)
PROGRESS NOTE    Jimmy Hawkins  ACZ:[id number] DOB: March 23, 1975 DOA: 08/10/2019 PCP: Patient, No Pcp Per   Brief Narrative:  HPI: Jimmy Hawkins is a 44 y.o. male with medical history significant of alcohol abuse/dependence since about 24 years where he drinks about 8 beers per day presented to ED with complaint of abdominal pain and bloody vomiting.  Patient is Spanish-speaking.  His niece who is at the bedside is helping with interpretation.  According to patient, he started having upper abdominal pain since about 2 weeks ago.  This pain was initially intermittent and now has progressed in intensity and has been constant since last 2 to 3 days.  He started taking Advil about 6 tablets of 200 mg each on daily basis.  Early this morning at around 5 AM, he had one episode of bloody vomiting with blood clots.  EMS was called.  He had another episode in route with the EMS.  He was cold, clammy and diaphoretic and also had an episode of syncope after first episode.  According to him, he had 1 episode of hematemesis about 4 months ago and he sought medical attention in urgent care setting.  Currently his pain is 6 out of 10 and he does not have any other complaint.  ED Course: Upon arrival to ED, he was cold, clammy and diaphoretic and pale but his vitals were stable.  Hemoglobin was 6.9.  He was started on Protonix as well as octreotide drip. [facility] was consulted.  2 units of PRBC transfusion has been ordered.  Hospitalist service was consulted to admit the patient for further management.  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  Hemoglobin over 8 now.  Status post EGD.  This shows large mass in upper two third of the esophagus.   Biopsies sent for [facility] pathology report.  Highly suspicious for cancer.  GI has ordered CT chest abdomen and pelvis.  I have paged out to on-call oncologist Dr. Lindi Adie for official consultation and waiting for callback.  Protonix and octreotide has been discontinued.  Monitor H&H every 12 hours.  Syncope: Secondary to hematemesis episode and likely hypotension.  Currently fine.  Chronic alcohol abuse: No signs of withdrawal.  Continue CIWA protocol with as needed Ativan.  DVT prophylaxis: SCDs Start: 08/10/19 1545   Code Status: Full Code  Family Communication: Wife and sister present at bedside.  Extensive discussion about potential diagnosis and plan of care.  Status is: Observation  The patient will require care spanning > 2 midnights and should be moved to inpatient because: Ongoing diagnostic testing needed not appropriate for outpatient work up  Dispo: The patient is from: Home              Anticipated d/c is to: Home              Anticipated d/c date is: 2 days              Patient currently is not medically stable to d/c.        Estimated body mass index is 24.84 kg/m as calculated from the following:   Height as of this encounter: 5' 7.72" (1.72 m).   Weight as of this encounter: 73.5 kg.  Nutritional status:               Consultants:   GI  Procedures:   EGD  Antimicrobials:  Anti-infectives (From admission, onward)   Start     Dose/Rate Route Frequency Ordered Stop   08/10/19 1800  cefTRIAXone (ROCEPHIN) 1 g in sodium chloride 0.9 % 100 mL IVPB  Status:  Discontinued        1 g 200 mL/hr over 30 Minutes Intravenous Every 24 hours 08/10/19 1744 08/11/19 0953         Subjective: Patient seen and examined after the EGD.  Sister and wife at the bedside.  In person Spanish interpreter was used.  Patient's abdominal pain is improved.  No other complaint.  Family asked several questions which were answered to the best of their satisfaction  however they were also informed that the work-up is still pending and some of the questions would likely be answered as picture unfolds.  Objective: Vitals:   08/11/19 0859 08/11/19 0910 08/11/19 0925 08/11/19 0951  BP: 128/82 123/68 131/76 121/74  Pulse:  74 72 67  Resp: 18 17 15 17   Temp: 98.8 F (37.1 C)   97.9 F (36.6 C)  TempSrc: Oral   Oral  SpO2: 93% 100% 100% 100%  Weight:      Height:        Intake/Output Summary (Last 24 hours) at 08/11/2019 1305 Last data filed at 08/11/2019 1053 Gross per 24 hour  Intake 2174.91 ml  Output --  Net 2174.91 ml   Filed Weights   08/10/19 1414 08/11/19 0456 08/11/19 0802  Weight: 73.5 kg 80.8 kg 73.5 kg    Examination:  General exam: Appears calm and comfortable  Respiratory system: Clear to auscultation. Respiratory effort normal. Cardiovascular system: S1 & S2 heard, RRR. No JVD, murmurs, rubs, gallops or clicks. No pedal edema. Gastrointestinal system: Abdomen is nondistended, soft and mild epigastric tenderness. No organomegaly or masses felt. Normal bowel sounds heard. Central nervous system: Alert and oriented. No focal neurological deficits. Extremities: Symmetric 5 x 5 power. Skin: No rashes, lesions or ulcers Psychiatry: Judgement and insight appear normal. Mood & affect appropriate.    Data Reviewed: I have personally reviewed following labs and imaging studies  CBC: Recent Labs  Lab 08/10/19 1425 08/11/19 0336 08/11/19 1149  WBC 9.5 5.4 5.7  NEUTROABS 7.7  --   --   HGB 6.9* 7.7* 8.3*  HCT 23.0* 25.6* 27.5*  MCV 83.9 83.4 83.8  PLT 233 179 191   Basic Metabolic Panel: Recent Labs  Lab 08/10/19 1425 08/11/19 0336  NA 133* 136  137  K 4.0 4.0  4.0  CL 101 106  106  CO2 23 22  22   GLUCOSE 138* 127*  127*  BUN 18 12  12   CREATININE 0.58* 0.64  0.64  CALCIUM 8.5* 7.7*  7.9*  MG  --  1.7  PHOS  --  3.3   GFR: Estimated Creatinine Clearance: 115.2 mL/min (by C-G formula based on SCr of 0.64  mg/dL). Liver Function Tests: Recent Labs  Lab 08/10/19 1425 08/11/19 0336  AST 26 31  29   ALT 19 20  19   ALKPHOS 53 43  43  BILITOT 0.5 0.8  0.9  PROT 6.9 6.1*  6.1*  ALBUMIN 2.5* 2.2*  2.2*   Recent Labs  Lab 08/10/19 1425  LIPASE 26   No results for input(s): AMMONIA in the last 168 hours. Coagulation Profile: Recent Labs  Lab 08/10/19  1425  INR 1.2   Cardiac Enzymes: No results for input(s): CKTOTAL, CKMB, CKMBINDEX, TROPONINI in the last 168 hours. BNP (last 3 results) No results for input(s): PROBNP in the last 8760 hours. HbA1C: No results for input(s): HGBA1C in the last 72 hours. CBG: No results for input(s): GLUCAP in the last 168 hours. Lipid Profile: No results for input(s): CHOL, HDL, LDLCALC, TRIG, CHOLHDL, LDLDIRECT in the last 72 hours. Thyroid Function Tests: Recent Labs    08/11/19 0336  TSH 0.357   Anemia Panel: No results for input(s): VITAMINB12, FOLATE, FERRITIN, TIBC, IRON, RETICCTPCT in the last 72 hours. Sepsis Labs: No results for input(s): PROCALCITON, LATICACIDVEN in the last 168 hours.  Recent Results (from the past 240 hour(s))  SARS Coronavirus 2 by RT PCR (hospital order, performed in [facility] hospital lab) Nasopharyngeal Nasopharyngeal Swab     Status: None   Collection Time: 08/10/19  3:25 PM   Specimen: Nasopharyngeal Swab  Result Value Ref Range Status   SARS Coronavirus 2 NEGATIVE NEGATIVE Final    Comment: (NOTE) SARS-CoV-2 target nucleic acids are NOT DETECTED.  The SARS-CoV-2 RNA is generally detectable in upper and lower respiratory specimens during the acute phase of infection. The lowest concentration of SARS-CoV-2 viral copies this assay can detect is 250 copies / mL. A negative result does not preclude SARS-CoV-2 infection and should not be used as the sole basis for treatment or other patient management decisions.  A negative result may occur with improper specimen collection / handling, submission of  specimen other than nasopharyngeal swab, presence of viral mutation(s) within the areas targeted by this assay, and inadequate number of viral copies (<250 copies / mL). A negative result must be combined with clinical observations, patient history, and epidemiological information.  Fact Sheet for Patients:   StrictlyIdeas.no  Fact Sheet for Healthcare Providers: BankingDealers.co.za  This test is not yet approved or  cleared by the [place] FDA and has been authorized for detection and/or diagnosis of SARS-CoV-2 by FDA under an Emergency Use Authorization (EUA).  This EUA will remain in effect (meaning this test can be used) for the duration of the COVID-19 declaration under Section 564(b)(1) of the Act, 21 U.S.C. section 360bbb-3(b)(1), unless the authorization is terminated or revoked sooner.  Performed at [facility], [place] [address], [place], [place] [address]       Radiology Studies: No results found.  Scheduled Meds: . folic acid  1 mg Oral Daily  . multivitamin with minerals  1 tablet Oral Daily  . [START ON 08/12/2019] pneumococcal 23 valent vaccine  0.5 mL Intramuscular Tomorrow-1000  . sodium chloride flush  3 mL Intravenous Q12H  . thiamine  100 mg Oral Daily   Or  . thiamine  100 mg Intravenous Daily   Continuous Infusions: . sodium chloride Stopped (08/10/19 1541)     LOS: 0 days   Time spent: 33 minutes   Darliss Cheney, MD [facility]  08/11/2019, 1:05 PM   To contact the attending provider between 7A-7P or the covering provider during after hours 7P-7A, please log into the web site [email or web address].

## 2019-08-11 NOTE — Consult Note (Addendum)
[place]  [phone]   HEMATOLOGY ONCOLOGY INPATIENT CONSULTATION   Jimmy Hawkins  DOB: May 31, 1975  MR#: [id number]  CSN#: [id number]    Requesting Physician: [facility]  Patient Care Team: Patient, No Pcp Per as PCP - General (General Practice)  Reason for consult: esophageal mass   History of present illness:  Pt is a 44 yo Spanish-speaking male, without significant past medical history except heavy alcohol drinker, presented with recurrent hematemesis. The first episode was in Jan. 2021, mild and he did not seek medical attention.  He subsequently had a 5-6 episodes of mild nausea, vomiting with hematemesis in the past 6 months.  He reports progressive dysphagia with solid food for the past months, he is currently on soft diet. He developed intermittent upper abdominal pain for the past 2 weeks, and he has been taking Advil 5 to 6 tablets daily.  Yesterday morning, he had 1 episode of nausea, vomiting with bloody emesis and blood clots.  EMS was called, he was brought to the emergency room.  Labs reviewed hemoglobin 6.9, he received 2 units of PRBC transfusion.  GI was consulted, he underwent upper endoscopy this morning, which showed a large fungating mass in the upper to middle esophagus, biopsy was obtained, pathology is still pending.  He underwent staging CT chest, abdomen pelvis with contrast today, which showed mid to lower esophageal mass, borderline enlarged AP window lymph nodes, no evidence of distant metastasis.  He has lost about 10-15lbs over the past 6 months, he states his energy has been normal, until lately.   Patient denies significant past medical history, but has not seen Dr. Malachi Paradise.  He is on treatment from [place], has been in the [name] for 20 years, works in the Paediatric nurse.  He is married, lives with his wife and they have a 70 year old daughter.  He denies smoking, or illicit drug, but does drink beer 7-8 bottles  a day for the past 20 years.  His wife and sister were in his room when I talked to him.  I used online interpreter service for the interview.  MEDICAL HISTORY:  Past Medical History:  Diagnosis Date  . GERD (gastroesophageal reflux disease)   . Hematemesis with nausea 08/10/2019    SURGICAL HISTORY: Past Surgical History:  Procedure Laterality Date  . ESOPHAGOGASTRODUODENOSCOPY  08/11/2019    SOCIAL HISTORY: Social History   Socioeconomic History  . Marital status: Single    Spouse name: Not on file  . Number of children: Not on file  . Years of education: Not on file  . Highest education level: Not on file  Occupational History  . Not on file  Tobacco Use  . Smoking status: Never Smoker  . Smokeless tobacco: Never Used  Vaping Use  . Vaping Use: Never used  Substance and Sexual Activity  . Alcohol use: Yes    Alcohol/week: 6.0 standard drinks    Types: 6 Cans of beer per week    Comment:  6 drinks/day  . Drug use: Never  . Sexual activity: Not on file  Other Topics Concern  . Not on file  Social History Narrative  . Not on file   Social Determinants of Health   Financial Resource Strain:   . Difficulty of Paying Living Expenses:   Food Insecurity:   . Worried About Charity fundraiser in the Last Year:   . Arboriculturist in the Last Year:   Transportation Needs:   .  Lack of Transportation (Medical):   Marland Kitchen Lack of Transportation (Non-Medical):   Physical Activity:   . Days of Exercise per Week:   . Minutes of Exercise per Session:   Stress:   . Feeling of Stress :   Social Connections:   . Frequency of Communication with Friends and Family:   . Frequency of Social Gatherings with Friends and Family:   . Attends Religious Services:   . Active Member of Clubs or Organizations:   . Attends Archivist Meetings:   Marland Kitchen Marital Status:   Intimate Partner Violence:   . Fear of Current or Ex-Partner:   . Emotionally Abused:   Marland Kitchen Physically Abused:     . Sexually Abused:     FAMILY HISTORY: Family History  Problem Relation Age of Onset  . Stomach cancer Mother   . Healthy Father     ALLERGIES:  has No Known Allergies.  MEDICATIONS:  Current Facility-Administered Medications  Medication Dose Route Frequency Provider Last Rate Last Admin  . 0.9 %  sodium chloride infusion  10 mL/hr Intravenous Once [name], MD   Held at 08/10/19 1541  . acetaminophen (TYLENOL) tablet 650 mg  650 mg Oral Q6H PRN [name], MD       Or  . acetaminophen (TYLENOL) suppository 650 mg  650 mg Rectal Q6H PRN [name], MD      . folic acid (FOLVITE) tablet 1 mg  1 mg Oral Daily [name], MD   1 mg at 08/11/19 1029  . HYDROmorphone (DILAUDID) injection 0.5-1 mg  0.5-1 mg Intravenous Q2H PRN [name], MD      . LORazepam (ATIVAN) tablet 1-4 mg  1-4 mg Oral Q1H PRN [name], MD       Or  . LORazepam (ATIVAN) injection 1-4 mg  1-4 mg Intravenous Q1H PRN [name], MD      . multivitamin with minerals tablet 1 tablet  1 tablet Oral Daily [name], MD   1 tablet at 08/11/19 1029  . ondansetron (ZOFRAN) tablet 4 mg  4 mg Oral Q6H PRN [name], MD       Or  . ondansetron [facility]) injection 4 mg  4 mg Intravenous Q6H PRN [name], MD      . Derrill Memo ON 08/12/2019] pneumococcal 23 valent vaccine (PNEUMOVAX-23) injection 0.5 mL  0.5 mL Intramuscular Tomorrow-1000 Pahwani, Ravi, MD      . sodium chloride flush (NS) 0.9 % injection 3 mL  3 mL Intravenous Q12H [name], MD   3 mL at 08/11/19 1029  . thiamine tablet 100 mg  100 mg Oral Daily [name], MD   100 mg at 08/11/19 1029   Or  . thiamine (B-1) injection 100 mg  100 mg Intravenous Daily [name], MD        REVIEW OF SYSTEMS:   Constitutional: Denies fevers, chills or abnormal night sweats, (+) weight loss  Eyes: Denies blurriness of vision, double vision or watery eyes Ears, nose, mouth, throat, and face: Denies mucositis  or sore throat Respiratory: Denies cough, dyspnea or wheezes Cardiovascular: Denies palpitation, chest discomfort or lower extremity swelling Gastrointestinal:  See HPI  Skin: Denies abnormal skin rashes Lymphatics: Denies new lymphadenopathy or easy bruising Neurological:Denies numbness, tingling or new weaknesses Behavioral/Psych: Mood is stable, no new changes  All other systems were reviewed with the patient and are negative.  PHYSICAL EXAMINATION:  ECOG PERFORMANCE STATUS: 1 - Symptomatic but completely ambulatory  Vitals:   08/11/19 1431 08/11/19 1835  BP: 125/78 130/78  Pulse: 78 80  Resp: 18 18  Temp: 99 F (37.2 C) 99.1 F (37.3 C)  SpO2: 100% 100%   Filed Weights   08/10/19 1414 08/11/19 0456 08/11/19 0802  Weight: 162 lb (73.5 kg) 178 lb 2.1 oz (80.8 kg) 162 lb (73.5 kg)    GENERAL:alert, no distress and comfortable SKIN: skin color, texture, turgor are normal, no rashes or significant lesions EYES: normal, conjunctiva are pink and non-injected, sclera clear OROPHARYNX:no exudate, no erythema and lips, buccal mucosa, and tongue normal  NECK: supple, thyroid normal size, non-tender, without nodularity LYMPH:  no palpable lymphadenopathy in the cervical, axillary or inguinal LUNGS: clear to auscultation and percussion with normal breathing effort HEART: regular rate & rhythm and no murmurs and no lower extremity edema ABDOMEN:abdomen soft, non-tender and normal bowel sounds Musculoskeletal:no cyanosis of digits and no clubbing  PSYCH: alert & oriented x 3 with fluent speech NEURO: no focal motor/sensory deficits  LABORATORY DATA:  I have reviewed the data as listed Lab Results  Component Value Date   WBC 6.4 08/11/2019   HGB 8.2 (L) 08/11/2019   HCT 26.4 (L) 08/11/2019   MCV 82.5 08/11/2019   PLT 191 08/11/2019   Recent Labs    08/10/19 1425 08/11/19 0336  NA 133* 136  137  K 4.0 4.0  4.0  CL 101 106  106  CO2 23 22  22   GLUCOSE 138* 127*  127*    BUN 18 12  12   CREATININE 0.58* 0.64  0.64  CALCIUM 8.5* 7.7*  7.9*  GFRNONAA >60 >60  >60  GFRAA >60 >60  >60  PROT 6.9 6.1*  6.1*  ALBUMIN 2.5* 2.2*  2.2*  AST 26 31  29   ALT 19 20  19   ALKPHOS 53 43  43  BILITOT 0.5 0.8  0.9    RADIOGRAPHIC STUDIES: I have personally reviewed the radiological images as listed and agreed with the findings in the report. CT CHEST W CONTRAST  Result Date: 08/11/2019 CLINICAL DATA:  Large thoracic esophageal mass on upper endoscopy today, high suspicion for esophageal malignancy, staging workup. EXAM: CT CHEST, ABDOMEN, AND PELVIS WITH CONTRAST TECHNIQUE: Multidetector CT imaging of the chest, abdomen and pelvis was performed following the standard protocol during bolus administration of intravenous contrast. CONTRAST:  137mL OMNIPAQUE IOHEXOL 300 MG/ML  SOLN COMPARISON:  Chest radiograph 07/13/2009 FINDINGS: CT CHEST FINDINGS Cardiovascular: Unremarkable Mediastinum/Nodes: Fusiform masslike appearance primarily along the lumen of the mid to distal esophagus along an 8 cm segment, the esophagus including this central masslike component measures 6.5 by 3.4 cm in cross-section on image 35/4, strongly favoring esophageal malignancy. There is wall thickening of the esophagus just distal to this mass with an air-fluid level. Indistinctness of esophageal wall margins particularly along the left side in the vicinity of the mass. AP window lymph node 1.0 cm in short axis, image 24/4. Right eccentric subcarinal node 0.6 cm in short axis, image 32/4. Right upper paratracheal node 0.6 cm in short axis, image 14/4. Aside from the borderline enlarged AP window lymph node, no overtly pathologically enlarged thoracic lymph nodes. Lungs/Pleura: Trace bilateral pleural effusions. Musculoskeletal: Unremarkable CT ABDOMEN PELVIS FINDINGS Hepatobiliary: Diffuse hepatic steatosis. Small gallstones are present in the gallbladder. No appreciable hepatic metastatic lesions  identified. No biliary dilatation. Pancreas: Unremarkable Spleen: Unremarkable.  Two small accessory spleens are present. Adrenals/Urinary Tract:  Tiny hypodense lesion in the right kidney lower pole on image 81/4 is likely a cyst but technically too small to characterize. The kidneys appear otherwise normal. The adrenal glands in urinary bladder likewise appear normal. Stomach/Bowel: Unremarkable Vascular/Lymphatic: Right gastric node axis on image 0.7 cm in short 59/4. No overtly pathologic adenopathy. No significant atherosclerotic calcification. Reproductive: Unremarkable Other: No supplemental non-categorized findings. Musculoskeletal: Fatty spermatic cords likely due to small bilateral indirect inguinal hernias. IMPRESSION: 1. Mid to lower esophageal mass along an 8 cm segment, large endoluminal component, strongly favoring esophageal malignancy. Borderline enlarged AP window lymph node. No findings of distant metastatic disease. 2. Trace bilateral pleural effusions. 3. Diffuse hepatic steatosis. 4. Cholelithiasis. 5. Fatty spermatic cords likely due to small bilateral indirect inguinal hernias. Electronically Signed   By: Van Clines M.D.   On: 08/11/2019 16:07   CT ABDOMEN PELVIS W CONTRAST  Result Date: 08/11/2019 CLINICAL DATA:  Large thoracic esophageal mass on upper endoscopy today, high suspicion for esophageal malignancy, staging workup. EXAM: CT CHEST, ABDOMEN, AND PELVIS WITH CONTRAST TECHNIQUE: Multidetector CT imaging of the chest, abdomen and pelvis was performed following the standard protocol during bolus administration of intravenous contrast. CONTRAST:  16mL OMNIPAQUE IOHEXOL 300 MG/ML  SOLN COMPARISON:  Chest radiograph 07/13/2009 FINDINGS: CT CHEST FINDINGS Cardiovascular: Unremarkable Mediastinum/Nodes: Fusiform masslike appearance primarily along the lumen of the mid to distal esophagus along an 8 cm segment, the esophagus including this central masslike component measures 6.5  by 3.4 cm in cross-section on image 35/4, strongly favoring esophageal malignancy. There is wall thickening of the esophagus just distal to this mass with an air-fluid level. Indistinctness of esophageal wall margins particularly along the left side in the vicinity of the mass. AP window lymph node 1.0 cm in short axis, image 24/4. Right eccentric subcarinal node 0.6 cm in short axis, image 32/4. Right upper paratracheal node 0.6 cm in short axis, image 14/4. Aside from the borderline enlarged AP window lymph node, no overtly pathologically enlarged thoracic lymph nodes. Lungs/Pleura: Trace bilateral pleural effusions. Musculoskeletal: Unremarkable CT ABDOMEN PELVIS FINDINGS Hepatobiliary: Diffuse hepatic steatosis. Small gallstones are present in the gallbladder. No appreciable hepatic metastatic lesions identified. No biliary dilatation. Pancreas: Unremarkable Spleen: Unremarkable.  Two small accessory spleens are present. Adrenals/Urinary Tract: Tiny hypodense lesion in the right kidney lower pole on image 81/4 is likely a cyst but technically too small to characterize. The kidneys appear otherwise normal. The adrenal glands in urinary bladder likewise appear normal. Stomach/Bowel: Unremarkable Vascular/Lymphatic: Right gastric node axis on image 0.7 cm in short 59/4. No overtly pathologic adenopathy. No significant atherosclerotic calcification. Reproductive: Unremarkable Other: No supplemental non-categorized findings. Musculoskeletal: Fatty spermatic cords likely due to small bilateral indirect inguinal hernias. IMPRESSION: 1. Mid to lower esophageal mass along an 8 cm segment, large endoluminal component, strongly favoring esophageal malignancy. Borderline enlarged AP window lymph node. No findings of distant metastatic disease. 2. Trace bilateral pleural effusions. 3. Diffuse hepatic steatosis. 4. Cholelithiasis. 5. Fatty spermatic cords likely due to small bilateral indirect inguinal hernias. Electronically  Signed   By: Van Clines M.D.   On: 08/11/2019 16:07    ASSESSMENT & PLAN:  44 year old Spanish-speaking male, without a significant past medical history except heavy alcohol drinker, presented with dysphagia, hematemesis, and weight loss.  1. Mid-esophageal mass, likely malignant, locally advanced  2.  Moderate anemia secondary to GI bleeding and probable iron deficiency 3.  Heavy alcohol user  Recommendations: -I reviewed his endoscopy and CT scan findings.  This is most consistent with esophageal cancer, biopsy pathology is still pending. -CT scan showed borderline AP window lymph node, this is likely locally advanced disease.  I recommend PET scan after discharge. May not need EUS -I discussed treatment options for locally advanced esophageal cancer, including neoadjuvant concurrent chemoradiation, followed by esophagectomy, and adjuvant immunotherapy.  The logistics, side effects and treatment course were discussed with patient and his family in details. -Patient does not have insurance, please consult SW to see if he qualifies Licensed conveyancer, or if he can get financial assistance from [place]  -I will place a dietician consult for nutrition support, we discussed the possibility of feeding tube placement during his chemoradiation.  He is still tolerating oral soft diet well, I do not think he needs a feeding tube at this point. -I will set up his outpatient radiation oncology appointment with Dr. Lisbeth Renshaw next week, and I will see him for follow-up on same day. -I ordered ferritin and iron level, please give 1 dose of Feraheme tomorrow if iron level is low -Discussed alcohol cessation, he agrees to try.   All questions were answered. The patient knows to call the clinic with any problems, questions or concerns.      [name], MD 08/11/2019 7:08 PM

## 2019-08-11 NOTE — Op Note (Addendum)
[facility] Patient Name: Jimmy Hawkins Procedure Date : 08/11/2019 MRN: [id number] Attending MD: [name] , MD Date of Birth: Oct 11, 1975 CSN: [id number] Age: 44 Admit Type: Inpatient Procedure:                Upper GI endoscopy Indications:              Hematemesis Providers:                [name], MD, Grace Isaac, RN, Theodora Blow,                            Technician Referring MD:              Medicines:                Propofol per Anesthesia, Monitored Anesthesia Care Complications:            No immediate complications. Estimated Blood Loss:     Estimated blood loss was minimal. Procedure:                Pre-Anesthesia Assessment:                           - Prior to the procedure, a History and Physical                            was performed, and patient medications and                            allergies were reviewed. The patient's tolerance of                            previous anesthesia was also reviewed. The risks                            and benefits of the procedure and the sedation                            options and risks were discussed with the patient.                            All questions were answered, and informed consent                            was obtained. Prior Anticoagulants: The patient has                            taken no previous anticoagulant or antiplatelet                            agents. ASA Grade Assessment: II - A patient with                            mild systemic disease. After reviewing the risks  and benefits, the patient was deemed in                            satisfactory condition to undergo the procedure.                           After obtaining informed consent, the endoscope was                            passed under direct vision. Throughout the                            procedure, the patient's blood pressure, pulse, and                             oxygen saturations were monitored continuously. The                            [id number]-H190 ([id number] Olympus gastroscope was                            introduced through the mouth, and advanced to the                            second part of duodenum. The upper GI endoscopy was                            somewhat difficult due to a partially obstructing                            mass. The patient tolerated the procedure well. Scope In: Scope Out: Findings:      A large, friable, firm mass with bleeding was found in the upper third       of the esophagus and in the middle third of the esophagus, 30 cm from       the incisors. The mass was partially obstructing. Biopsies were taken       with a cold forceps for histology. Verification of patient       identification for the specimen was done. Estimated blood loss was       minimal.      The entire examined stomach was normal.      The examined duodenum was normal.      The cardia and gastric fundus were normal on retroflexion. Impression:               - Partially obstructing, likely malignant                            esophageal tumor was found in the upper third of                            the esophagus and in the middle third of the                            esophagus. Biopsied. Very large  and long -                            difficult but able to advance scope by this lesion                            - await CT for better length estimate                           - Normal stomach.                           - Normal examined duodenum. Recommendation:           - Return patient to hospital ward for ongoing care.                           - Clear liquid diet. Probably will tolerate fulls -                            pureed going forward - once scans done can see what                            is possible - wait on a gastrostomy until we know                            more as if he is a surgical candidate gastrostomy                             can be a problem                           - Continue present medications.                           - Await pathology results.                           - Staging CT chest, abd and pelvis ordered                           Will explain as best I can through interpreter -                            done - he understands current working dx and                           stop IV octreotide and pantoprazole infusions and                            ceftriaxone                           Path sent rush - I think reasonable to consult  oncology before path but defer to [facility], also                            consider CT surgery consult once scans and path in.                            Given social situation I think inpatient consults                            might be better. Procedure Code(s):        --- Professional ---                           [phone], Esophagogastroduodenoscopy, flexible,                            transoral; with biopsy, single or multiple Diagnosis Code(s):        --- Professional ---                           C15.3, Malignant neoplasm of upper third of                            esophagus                           C15.4, Malignant neoplasm of middle third of                            esophagus                           K92.0, Hematemesis CPT copyright 2019 American Medical Association. All rights reserved. The codes documented in this report are preliminary and upon coder review may  be revised to meet current compliance requirements. [name], MD 08/11/2019 9:07:31 AM This report has been signed electronically. Number of Addenda: 0

## 2019-08-11 NOTE — Anesthesia Procedure Notes (Signed)
Procedure Name: MAC Date/Time: 08/11/2019 8:45 AM Performed by: Lowella Dell, CRNA Pre-anesthesia Checklist: Patient identified, Emergency Drugs available, Suction available, Patient being monitored and Timeout performed Oxygen Delivery Method: Nasal cannula Induction Type: IV induction Placement Confirmation: positive ETCO2 Dental Injury: Teeth and Oropharynx as per pre-operative assessment

## 2019-08-11 NOTE — Anesthesia Postprocedure Evaluation (Signed)
Anesthesia Post Note  Patient: [name]  Procedure(s) Performed: ESOPHAGOGASTRODUODENOSCOPY (EGD) WITH PROPOFOL (N/A ) BIOPSY     Patient location during evaluation: Endoscopy Anesthesia Type: MAC Level of consciousness: awake and alert Pain management: pain level controlled Vital Signs Assessment: post-procedure vital signs reviewed and stable Respiratory status: spontaneous breathing, nonlabored ventilation, respiratory function stable and patient connected to nasal cannula oxygen Cardiovascular status: blood pressure returned to baseline and stable Postop Assessment: no apparent nausea or vomiting Anesthetic complications: no   No complications documented.  Last Vitals:  Vitals:   08/11/19 0925 08/11/19 0951  BP: 131/76 121/74  Pulse: 72 67  Resp: 15 17  Temp:  36.6 C  SpO2: 100% 100%    Last Pain:  Vitals:   08/11/19 0951  TempSrc: Oral  PainSc:                  Barnet Glasgow

## 2019-08-11 NOTE — Interval H&P Note (Signed)
History and Physical Interval Note:  08/11/2019 8:31 AM  Jimmy Hawkins  has presented today for surgery, with the diagnosis of hematemesis   epig pain   anemia.  The various methods of treatment have been discussed with the patient and family. After consideration of risks, benefits and other options for treatment, the patient has consented to  Procedure(s): ESOPHAGOGASTRODUODENOSCOPY (EGD) WITH PROPOFOL (N/A) as a surgical intervention.  The patient's history has been reviewed, patient examined, no change in status, stable for surgery.  I have reviewed the patient's chart and labs.  Questions were answered to the patient's satisfaction.     Silvano Rusk

## 2019-08-12 ENCOUNTER — Telehealth (INDEPENDENT_AMBULATORY_CARE_PROVIDER_SITE_OTHER): Payer: Self-pay | Admitting: General Practice

## 2019-08-12 DIAGNOSIS — D508 Other iron deficiency anemias: Secondary | ICD-10-CM

## 2019-08-12 DIAGNOSIS — D509 Iron deficiency anemia, unspecified: Secondary | ICD-10-CM | POA: Diagnosis present

## 2019-08-12 LAB — BASIC METABOLIC PANEL
Anion gap: 8 (ref 5–15)
BUN: 6 mg/dL (ref 6–20)
CO2: 25 mmol/L (ref 22–32)
Calcium: 8.3 mg/dL — ABNORMAL LOW (ref 8.9–10.3)
Chloride: 103 mmol/L (ref 98–111)
Creatinine, Ser: 0.54 mg/dL — ABNORMAL LOW (ref 0.61–1.24)
GFR calc Af Amer: >60 mL/min (ref >60)
GFR calc non Af Amer: >60 mL/min (ref >60)
Glucose, Bld: 117 mg/dL — ABNORMAL HIGH (ref 70–99)
Potassium: 3.6 mmol/L (ref 3.5–5.1)
Sodium: 136 mmol/L (ref 135–145)

## 2019-08-12 LAB — CBC WITH DIFFERENTIAL/PLATELET
Abs Immature Granulocytes: 0.02 10*3/uL (ref 0.00–0.07)
Basophils Absolute: 0 10*3/uL (ref 0.0–0.1)
Basophils Relative: 0 %
Eosinophils Absolute: 0.1 10*3/uL (ref 0.0–0.5)
Eosinophils Relative: 2 %
HCT: 25 % — ABNORMAL LOW (ref 39.0–52.0)
Hemoglobin: 7.6 g/dL — ABNORMAL LOW (ref 13.0–17.0)
Immature Granulocytes: 0 %
Lymphocytes Relative: 14 %
Lymphs Abs: 0.8 10*3/uL (ref 0.7–4.0)
MCH: 25.1 pg — ABNORMAL LOW (ref 26.0–34.0)
MCHC: 30.4 g/dL (ref 30.0–36.0)
MCV: 82.5 fL (ref 80.0–100.0)
Monocytes Absolute: 0.6 10*3/uL (ref 0.1–1.0)
Monocytes Relative: 11 %
Neutro Abs: 4.1 10*3/uL (ref 1.7–7.7)
Neutrophils Relative %: 73 %
Platelets: 195 10*3/uL (ref 150–400)
RBC: 3.03 MIL/uL — ABNORMAL LOW (ref 4.22–5.81)
RDW: 15 % (ref 11.5–15.5)
WBC: 5.7 10*3/uL (ref 4.0–10.5)
nRBC: 0 % (ref 0.0–0.2)

## 2019-08-12 LAB — HEMOGLOBIN AND HEMATOCRIT, BLOOD
HCT: 27.1 % — ABNORMAL LOW (ref 39.0–52.0)
Hemoglobin: 8.5 g/dL — ABNORMAL LOW (ref 13.0–17.0)

## 2019-08-12 LAB — SURGICAL PATHOLOGY

## 2019-08-12 MED ORDER — SODIUM CHLORIDE 0.9 % IV SOLN
510.0000 mg | Freq: Once | INTRAVENOUS | Status: AC
  Administered 2019-08-12: 510 mg via INTRAVENOUS
  Filled 2019-08-12: qty 17

## 2019-08-12 MED ORDER — IRON 325 (65 FE) MG PO TABS: 1.0000 | ORAL_TABLET | Freq: Two times a day (BID) | ORAL | 0 refills | Status: DC

## 2019-08-12 MED ORDER — OMEPRAZOLE 40 MG PO CPDR: 40.0000 mg | DELAYED_RELEASE_CAPSULE | Freq: Every day | ORAL | 0 refills | Status: DC

## 2019-08-12 MED FILL — OMEPRAZOLE 40 MG CPDR: 40 | 30 days supply | Qty: 30 | Fill #0

## 2019-08-12 MED FILL — FERROUS SULFATE 325 MG TAB: 325 (65 FE) | 30 days supply | Qty: 60 | Fill #0

## 2019-08-12 NOTE — Progress Notes (Signed)
Patient discharged to home. Instructions reviewed with Spanish interpreter. Patient's wife and spouse present for review. Verbalizes understanding of all discharge instructions including discharge medications and follow up MD visits. All questions answered at this time.

## 2019-08-12 NOTE — Discharge Summary (Addendum)
Physician Discharge Summary  Jimmy Hawkins FKC:[id number] DOB: 1975/06/25 DOA: 08/10/2019  PCP: Patient, No Pcp Per  Admit date: 08/10/2019 Discharge date: 08/12/2019  Admitted From: Home Disposition: Home  Recommendations for Outpatient Follow-up:  1. Follow up with PCP in 1-2 weeks 2. Follow with oncology/Dr. Burr Medico next week. 3. Please obtain BMP/CBC in one week 4. Please follow up with your PCP on the following pending results: Unresulted Labs (From admission, onward) Comment          Start     Ordered   08/12/19 1100  Hemoglobin and hematocrit, blood  Once,   R        08/12/19 0830   08/11/19 1800  CBC with Differential/Platelet  Now then every 12 hours,   R (with TIMED occurrences)      08/11/19 1310           Home Health: None Equipment/Devices: None  Discharge Condition: Stable CODE STATUS: Full code Diet recommendation: Soft diet  Subjective: Seen and examined.  Sister at the bedside.  In person interpreter used for conversation.  Patient feels better except some upper abdominal intermittent pain.  No other complaint.  Brief/Interim Summary: Jimmy Longest Hernandezis a 44 y.o.malewith medical history significant ofalcohol abuse/dependence since about 24 years where he drinks about 8 beers per day presented to ED with complaint of abdominal pain for 2-week duration and bloody vomiting on the day of admission. For his abdominal pain, he started taking Advil about 6 tablets of 200 mg each on daily basis. Early this morning at around 5 AM, he had one episode of bloody vomiting with blood clots. EMS was called. He was cold, clammy and diaphoretic and also had an episode of syncope after first episode. According to him, he had 1 episode of hematemesis about 4 months ago and he sought medical attention in urgent care setting.  He had another episode in route to the ED.  Upon arrival to ED, he was cold, clammy and diaphoretic and pale but his vitals were  stable. Hemoglobin was 6.9. He was started on Protonix as well as octreotide drip. [facility] was consulted. 2 units of PRBC transfusion were given.[facility] Hospitalist service was consulted to admit the patient for further management.  His syncope was likely vasovagal.  After admission, he underwent EGD on 08/11/2019 which showed fungating mass in the upper two third of the esophagus highly suspicious for cancer.  For further staging, he underwent CT chest, abdomen and pelvis which showed highly advanced cancer however confined to the esophagus only and thankfully there was no metastasis.  Oncology was consulted.  Patient was seen by Dr. Burr Medico who had a long discussion with patient's family using interpreter where she went over all the possible and potential treatment modalities including chemoradiation and esophagectomy.  She has cleared the patient for discharge.  He is tolerating soft diet.  He is being discharged in stable condition and he is advised to continue with a soft diet.  Per her recommendation, I have ordered 1 dose of IV fareheme and dietitian consultation.  Social worker has been consulted to help patient obtaining insurance or charity as he does not have any insurance.  Once all of these things are done, patient will be discharged today.  He will be discharged on a month supply of omeprazole and oral Feosol.  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   omeprazole 40 MG  capsule Commonly known as: PRILOSEC Take 1 capsule (40 mg total) by mouth daily.       Follow-up Information    [place]. Schedule an appointment as soon as possible for a visit.   Contact information: [place] [id number] [phone]             No Known Allergies  Consultations: GI and oncology   Procedures/Studies: CT CHEST W CONTRAST  Result Date: 08/11/2019 CLINICAL DATA:  Large thoracic esophageal mass on upper endoscopy today, high suspicion for esophageal malignancy, staging workup. EXAM: CT CHEST, ABDOMEN, AND PELVIS WITH CONTRAST TECHNIQUE: Multidetector CT imaging of the chest, abdomen and pelvis was performed following the standard protocol during bolus administration of intravenous contrast. CONTRAST:  130mL OMNIPAQUE IOHEXOL 300 MG/ML  SOLN COMPARISON:  Chest radiograph 07/13/2009 FINDINGS: CT CHEST FINDINGS Cardiovascular: Unremarkable Mediastinum/Nodes: Fusiform masslike appearance primarily along the lumen of the mid to distal esophagus along an 8 cm segment, the esophagus including this central masslike component measures 6.5 by 3.4 cm in cross-section on image 35/4, strongly favoring esophageal malignancy. There is wall thickening of the esophagus just distal to this mass with an air-fluid level. Indistinctness of esophageal wall margins particularly along the left side in the vicinity of the mass. AP window lymph node 1.0 cm in short axis, image 24/4. Right eccentric subcarinal node 0.6 cm in short axis, image 32/4. Right upper paratracheal node 0.6 cm in short axis, image 14/4. Aside from the borderline enlarged AP window lymph node, no overtly pathologically enlarged thoracic lymph nodes. Lungs/Pleura: Trace bilateral pleural effusions. Musculoskeletal: Unremarkable CT ABDOMEN PELVIS FINDINGS Hepatobiliary: Diffuse hepatic steatosis. Small gallstones are present in the gallbladder. No appreciable  hepatic metastatic lesions identified. No biliary dilatation. Pancreas: Unremarkable Spleen: Unremarkable.  Two small accessory spleens are present. Adrenals/Urinary Tract: Tiny hypodense lesion in the right kidney lower pole on image 81/4 is likely a cyst but technically too small to characterize. The kidneys appear otherwise normal. The adrenal glands in urinary bladder likewise appear normal. Stomach/Bowel: Unremarkable Vascular/Lymphatic: Right gastric node axis on image 0.7 cm in short 59/4. No overtly pathologic adenopathy. No significant atherosclerotic calcification. Reproductive: Unremarkable Other: No supplemental non-categorized findings. Musculoskeletal: Fatty spermatic cords likely due to small bilateral indirect inguinal hernias. IMPRESSION: 1. Mid to lower esophageal mass along an 8 cm segment, large endoluminal component, strongly favoring esophageal malignancy. Borderline enlarged AP window lymph node. No findings of distant metastatic disease. 2. Trace bilateral pleural effusions. 3. Diffuse hepatic steatosis. 4. Cholelithiasis. 5. Fatty spermatic cords likely due to small bilateral indirect inguinal hernias. Electronically Signed   By: Van Clines M.D.   On: 08/11/2019 16:07   CT ABDOMEN PELVIS W CONTRAST  Result Date: 08/11/2019 CLINICAL DATA:  Large thoracic esophageal mass on upper endoscopy today, high suspicion for esophageal malignancy, staging workup. EXAM: CT CHEST, ABDOMEN, AND PELVIS WITH CONTRAST TECHNIQUE: Multidetector CT imaging  of the chest, abdomen and pelvis was performed following the standard protocol during bolus administration of intravenous contrast. CONTRAST:  155mL OMNIPAQUE IOHEXOL 300 MG/ML  SOLN COMPARISON:  Chest radiograph 07/13/2009 FINDINGS: CT CHEST FINDINGS Cardiovascular: Unremarkable Mediastinum/Nodes: Fusiform masslike appearance primarily along the lumen of the mid to distal esophagus along an 8 cm segment, the esophagus including this central  masslike component measures 6.5 by 3.4 cm in cross-section on image 35/4, strongly favoring esophageal malignancy. There is wall thickening of the esophagus just distal to this mass with an air-fluid level. Indistinctness of esophageal wall margins particularly along the left side in the vicinity of the mass. AP window lymph node 1.0 cm in short axis, image 24/4. Right eccentric subcarinal node 0.6 cm in short axis, image 32/4. Right upper paratracheal node 0.6 cm in short axis, image 14/4. Aside from the borderline enlarged AP window lymph node, no overtly pathologically enlarged thoracic lymph nodes. Lungs/Pleura: Trace bilateral pleural effusions. Musculoskeletal: Unremarkable CT ABDOMEN PELVIS FINDINGS Hepatobiliary: Diffuse hepatic steatosis. Small gallstones are present in the gallbladder. No appreciable hepatic metastatic lesions identified. No biliary dilatation. Pancreas: Unremarkable Spleen: Unremarkable.  Two small accessory spleens are present. Adrenals/Urinary Tract: Tiny hypodense lesion in the right kidney lower pole on image 81/4 is likely a cyst but technically too small to characterize. The kidneys appear otherwise normal. The adrenal glands in urinary bladder likewise appear normal. Stomach/Bowel: Unremarkable Vascular/Lymphatic: Right gastric node axis on image 0.7 cm in short 59/4. No overtly pathologic adenopathy. No significant atherosclerotic calcification. Reproductive: Unremarkable Other: No supplemental non-categorized findings. Musculoskeletal: Fatty spermatic cords likely due to small bilateral indirect inguinal hernias. IMPRESSION: 1. Mid to lower esophageal mass along an 8 cm segment, large endoluminal component, strongly favoring esophageal malignancy. Borderline enlarged AP window lymph node. No findings of distant metastatic disease. 2. Trace bilateral pleural effusions. 3. Diffuse hepatic steatosis. 4. Cholelithiasis. 5. Fatty spermatic cords likely due to small bilateral indirect  inguinal hernias. Electronically Signed   By: Van Clines M.D.   On: 08/11/2019 16:07      Discharge Exam: Vitals:   08/11/19 2210 08/12/19 0517  BP: 132/69 127/73  Pulse: 76 74  Resp: 19 16  Temp: 98.7 F (37.1 C) 98.9 F (37.2 C)  SpO2: 100% 99%   Vitals:   08/11/19 2100 08/11/19 2210 08/12/19 0500 08/12/19 0517  BP: 133/79 132/69  127/73  Pulse: 75 76  74  Resp: 18 19  16   Temp: (!) 100.4 F (38 C) 98.7 F (37.1 C)  98.9 F (37.2 C)  TempSrc: Oral Oral  Oral  SpO2: 100% 100%  99%  Weight:   72.9 kg   Height:        General: Pt is alert, awake, not in acute distress Cardiovascular: RRR, S1/S2 +, no rubs, no gallops Respiratory: CTA bilaterally, no wheezing, no rhonchi Abdominal: Soft, NT, ND, bowel sounds + Extremities: no edema, no cyanosis    The results of significant diagnostics from this hospitalization (including imaging, microbiology, ancillary and laboratory) are listed below for reference.     Microbiology: Recent Results (from the past 240 hour(s))  SARS Coronavirus 2 by RT PCR (hospital order, performed in [facility] hospital lab) Nasopharyngeal Nasopharyngeal Swab     Status: None   Collection Time: 08/10/19  3:25 PM   Specimen: Nasopharyngeal Swab  Result Value Ref Range Status   SARS Coronavirus 2 NEGATIVE NEGATIVE Final    Comment: (NOTE) SARS-CoV-2 target nucleic acids are NOT DETECTED.  The  SARS-CoV-2 RNA is generally detectable in upper and lower respiratory specimens during the acute phase of infection. The lowest concentration of SARS-CoV-2 viral copies this assay can detect is 250 copies / mL. A negative result does not preclude SARS-CoV-2 infection and should not be used as the sole basis for treatment or other patient management decisions.  A negative result may occur with improper specimen collection / handling, submission of specimen other than nasopharyngeal swab, presence of viral mutation(s) within the areas targeted by  this assay, and inadequate number of viral copies (<250 copies / mL). A negative result must be combined with clinical observations, patient history, and epidemiological information.  Fact Sheet for Patients:   StrictlyIdeas.no  Fact Sheet for Healthcare Providers: BankingDealers.co.za  This test is not yet approved or  cleared by the [place] FDA and has been authorized for detection and/or diagnosis of SARS-CoV-2 by FDA under an Emergency Use Authorization (EUA).  This EUA will remain in effect (meaning this test can be used) for the duration of the COVID-19 declaration under Section 564(b)(1) of the Act, 21 U.S.C. section 360bbb-3(b)(1), unless the authorization is terminated or revoked sooner.  Performed at [facility], [place] [address], [place], [place] [address]      Labs: BNP (last 3 results) No results for input(s): BNP in the last 8760 hours. Basic Metabolic Panel: Recent Labs  Lab 08/10/19 1425 08/11/19 0336 08/12/19 0619  NA 133* 136  137 136  K 4.0 4.0  4.0 3.6  CL 101 106  106 103  CO2 23 22  22 25   GLUCOSE 138* 127*  127* 117*  BUN 18 12  12 6   CREATININE 0.58* 0.64  0.64 0.54*  CALCIUM 8.5* 7.7*  7.9* 8.3*  MG  --  1.7  --   PHOS  --  3.3  --    Liver Function Tests: Recent Labs  Lab 08/10/19 1425 08/11/19 0336  AST 26 31  29   ALT 19 20  19   ALKPHOS 53 43  43  BILITOT 0.5 0.8  0.9  PROT 6.9 6.1*  6.1*  ALBUMIN 2.5* 2.2*  2.2*   Recent Labs  Lab 08/10/19 1425  LIPASE 26   No results for input(s): AMMONIA in the last 168 hours. CBC: Recent Labs  Lab 08/10/19 1425 08/11/19 0336 08/11/19 1149 08/11/19 1824 08/12/19 0619  WBC 9.5 5.4 5.7 6.4 5.7  NEUTROABS 7.7  --   --  4.2 4.1  HGB 6.9* 7.7* 8.3* 8.2* 7.6*  HCT 23.0* 25.6* 27.5* 26.4* 25.0*  MCV 83.9 83.4 83.8 82.5 82.5  PLT 233 179 207 191 195   Cardiac Enzymes: No results for input(s): CKTOTAL, CKMB,  CKMBINDEX, TROPONINI in the last 168 hours. BNP: Invalid input(s): POCBNP CBG: No results for input(s): GLUCAP in the last 168 hours. D-Dimer No results for input(s): DDIMER in the last 72 hours. Hgb A1c No results for input(s): HGBA1C in the last 72 hours. Lipid Profile No results for input(s): CHOL, HDL, LDLCALC, TRIG, CHOLHDL, LDLDIRECT in the last 72 hours. Thyroid function studies Recent Labs    08/11/19 0336  TSH 0.357   Anemia work up Recent Labs    08/11/19 1910  FERRITIN 15*  TIBC 389  IRON 18*   Urinalysis No results found for: COLORURINE, APPEARANCEUR, LABSPEC, [place], GLUCOSEU, HGBUR, BILIRUBINUR, KETONESUR, PROTEINUR, UROBILINOGEN, NITRITE, LEUKOCYTESUR Sepsis Labs Invalid input(s): PROCALCITONIN,  WBC,  LACTICIDVEN Microbiology Recent Results (from the past 240 hour(s))  SARS Coronavirus 2 by  RT PCR (hospital order, performed in [facility] hospital lab) Nasopharyngeal Nasopharyngeal Swab     Status: None   Collection Time: 08/10/19  3:25 PM   Specimen: Nasopharyngeal Swab  Result Value Ref Range Status   SARS Coronavirus 2 NEGATIVE NEGATIVE Final    Comment: (NOTE) SARS-CoV-2 target nucleic acids are NOT DETECTED.  The SARS-CoV-2 RNA is generally detectable in upper and lower respiratory specimens during the acute phase of infection. The lowest concentration of SARS-CoV-2 viral copies this assay can detect is 250 copies / mL. A negative result does not preclude SARS-CoV-2 infection and should not be used as the sole basis for treatment or other patient management decisions.  A negative result may occur with improper specimen collection / handling, submission of specimen other than nasopharyngeal swab, presence of viral mutation(s) within the areas targeted by this assay, and inadequate number of viral copies (<250 copies / mL). A negative result must be combined with clinical observations, patient history, and epidemiological information.  Fact Sheet  for Patients:   StrictlyIdeas.no  Fact Sheet for Healthcare Providers: BankingDealers.co.za  This test is not yet approved or  cleared by the [place] FDA and has been authorized for detection and/or diagnosis of SARS-CoV-2 by FDA under an Emergency Use Authorization (EUA).  This EUA will remain in effect (meaning this test can be used) for the duration of the COVID-19 declaration under Section 564(b)(1) of the Act, 21 U.S.C. section 360bbb-3(b)(1), unless the authorization is terminated or revoked sooner.  Performed at [facility], [place] [address] [place], [place] [address]      Time coordinating discharge: Over 30 minutes  SIGNED:   Darliss Cheney, MD  [facility] 08/12/2019, 10:07 AM  If 7PM-7AM, please contact night-coverage [email or web address]

## 2019-08-12 NOTE — TOC Initial Note (Signed)
Transition of Care [facility]) - Initial/Assessment Note    Patient Details  Name: Jimmy Hawkins MRN: [id number] Date of Birth: Apr 12, 1975  Transition of Care [facility]) CM/SW Contact:    Jimmy Favre, RN Phone Number: 08/12/2019, 10:55 AM  Clinical Narrative:                  Patient from home with family.   Entered patient in [facility] program. TOC filled prescriptions at cost of $1.00. Patient paid $1.00.  Scheduled follow up appointment at [facility] August 4 at 2:50 placed on AVS and patient and sister aware. [facility] will assist with orange card application. TOC does not assist patient's with medicaid application or enrolling with insurance. For medicaid patient will need to go to Department of Social Services. Patient and sister aware.  Expected Discharge Plan: Home/Self Care Barriers to Discharge: No Barriers Identified   Patient Goals and CMS Choice Patient states their goals for this hospitalization and ongoing recovery are:: to return to home CMS Medicare.gov Compare Post Acute Care list provided to:: Patient    Expected Discharge Plan and Services Expected Discharge Plan: Home/Self Care       Living arrangements for the past 2 months: Single Family Home Expected Discharge Date: 08/12/19               DME Arranged: N/A         HH Arranged: NA          Prior Living Arrangements/Services Living arrangements for the past 2 months: Single Family Home Lives with:: Siblings Patient language and need for interpreter reviewed:: Yes Jimmy Hawkins) Do you feel safe going back to the place where you live?: Yes      Need for Family Participation in Patient Care: Yes (Comment) Care giver support system in place?: Yes (comment)   Criminal Activity/Legal Involvement Pertinent to Current Situation/Hospitalization: No - Comment as needed  Activities of Daily Living Home Assistive Devices/Equipment: None ADL Screening  (condition at time of admission) Patient's cognitive ability adequate to safely complete daily activities?: Yes Is the patient deaf or have difficulty hearing?: No Does the patient have difficulty seeing, even when wearing glasses/contacts?: No Does the patient have difficulty concentrating, remembering, or making decisions?: No Patient able to express need for assistance with ADLs?: Yes Does the patient have difficulty dressing or bathing?: No Independently performs ADLs?: Yes (appropriate for developmental age) Does the patient have difficulty walking or climbing stairs?: No Weakness of Legs: None Weakness of Arms/Hands: None  Permission Sought/Granted   Permission granted to share information with : Yes, Verbal Permission Granted  Share Information with NAME: Jimmy Hawkins sister           Emotional Assessment Appearance:: Appears older than stated age Attitude/Demeanor/Rapport: Engaged Affect (typically observed): Accepting Orientation: : Oriented to Self, Oriented to Place, Oriented to  Time, Oriented to Situation Alcohol / Substance Use: Not Applicable Psych Involvement: No (comment)  Admission diagnosis:  Upper GI bleeding [K92.2] Patient Active Problem List   Diagnosis Date Noted   Iron deficiency anemia 08/12/2019   Upper GI bleed 08/11/2019   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 [place] [address] Phone: [phone] Fax: [phone]     Social  Determinants of Health (SDOH) Interventions    Readmission Risk Interventions No flowsheet data found.

## 2019-08-12 NOTE — Progress Notes (Signed)
   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  Chart reviewed - see plans for oncology Tx.  Call me if any ?'s  [name], MD, [facility] 08/12/2019 10:34 AM

## 2019-08-12 NOTE — Progress Notes (Signed)
Initial Nutrition Assessment  DOCUMENTATION CODES:   Not applicable  INTERVENTION:    Diet education provided.  Recommend Ensure Plus or Boost Plus supplements 1-2 times daily at home.  NUTRITION DIAGNOSIS:   Increased nutrient needs related to cancer and cancer related treatments as evidenced by estimated needs.  GOAL:   Patient will meet greater than or equal to 90% of their needs  MONITOR:   PO intake  REASON FOR ASSESSMENT:   Consult Assessment of nutrition requirement/status  ASSESSMENT:   44 yo male admitted with hematemesis, new esophageal mass, squamous cell carcinoma. PMH includes GERD.   Spoke with patient and two male family members at bedside using video [place] interpreter. Patient states that he has lost 10 lbs recently. 6% weight loss within the past month is significant for the time frame. NFPE completed and no muscle or fat depletion noted.   Provided "Head and Neck Cancer Nutrition Therapy" Spanish handout from the Academy of Nutrition and Dietetics. Reviewed the importance of adequate protein and calorie intake. Discussed suggestions for increasing intake when he is not feeling like eating. Also discussed having softer foods and including Ensure or Boost supplements 1-2 times daily.   Labs reviewed.  Medications reviewed and include MVI, folic acid, thiamine.    NUTRITION - FOCUSED PHYSICAL EXAM:    Most Recent Value  Orbital Region No depletion  Upper Arm Region No depletion  Thoracic and Lumbar Region No depletion  Buccal Region No depletion  Temple Region No depletion  Clavicle Bone Region No depletion  Clavicle and Acromion Bone Region No depletion  Scapular Bone Region No depletion  Dorsal Hand No depletion  Patellar Region No depletion  Anterior Thigh Region No depletion  Posterior Calf Region No depletion  Edema (RD Assessment) None  Hair Reviewed  Eyes Reviewed  Mouth Reviewed  Skin Reviewed  Nails Reviewed       Diet  Order:   Diet Order            DIET SOFT Room service appropriate? Yes; Fluid consistency: Thin  Diet effective now                 EDUCATION NEEDS:   Education needs have been addressed  Skin:  Skin Assessment: Reviewed RN Assessment  Last BM:  7/15  Height:   Ht Readings from Last 1 Encounters:  08/11/19 5' 7.72" (1.72 m)    Weight:   Wt Readings from Last 1 Encounters:  08/12/19 72.9 kg    BMI:  Body mass index is 24.64 kg/m.  Estimated Nutritional Needs:   Kcal:  2200-2400  Protein:  110-140 gm  Fluid:  >/= 2.2 L    Lucas Mallow, RD, LDN, CNSC Please refer to Amion for contact information.

## 2019-08-12 NOTE — Discharge Instructions (Signed)

## 2019-08-15 ENCOUNTER — Encounter (HOSPITAL_COMMUNITY): Payer: Self-pay | Admitting: Internal Medicine

## 2019-08-15 ENCOUNTER — Other Ambulatory Visit: Payer: Self-pay | Admitting: Hematology

## 2019-08-15 ENCOUNTER — Telehealth: Payer: Self-pay | Admitting: Hematology

## 2019-08-15 MED ORDER — HYDROCODONE-ACETAMINOPHEN 7.5-325 MG/15ML PO SOLN: 5.0000 mL | Freq: Four times a day (QID) | ORAL | 0 refills | Status: DC | PRN

## 2019-08-15 NOTE — Progress Notes (Signed)
Per Dr. Burr Medico I spoke with patient's niece Geralynn Rile concerning pain medication.  Dr. Burr Medico [facility]  Cautioned her that this is a narcotic medication.  Also Dr. Burr Medico will see the patient after his appointment on Thursday 7/22 with medical oncology.  His appointment with Dr. Burr Medico is 12:10.  She verbalized an understanding.

## 2019-08-15 NOTE — Telephone Encounter (Signed)
Scheduled per 7/19 staff message. Spoke with interpreter Almyra Free to give pt a call. appts added on 7/27.

## 2019-08-15 NOTE — Progress Notes (Signed)
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)   Initial Diagnosis   Malignant neoplasm of middle third of esophagus ([place])   08/11/2019 Imaging   CT CAP W contrast 08/11/19 IMPRESSION: 1. Mid to lower esophageal mass along an 8 cm segment, large endoluminal component, strongly favoring esophageal malignancy. Borderline enlarged AP window lymph node. No findings of distant metastatic disease. 2. Trace bilateral pleural effusions. 3. Diffuse hepatic steatosis. 4. Cholelithiasis. 5. Fatty spermatic cords likely due to small bilateral indirect inguinal hernias.   08/11/2019 Procedure   EGD by Dr Carlean Purl  IMPRESSION - Partially obstructing, likely malignant esophageal tumor was found in the upper third of the esophagus and in the middle third of the esophagus. Biopsied. Very large and long - difficult but able to advance scope by this lesion - await CT for better length estimate - Normal stomach. - Normal examined duodenum.   08/11/2019 Initial Biopsy   FINAL MICROSCOPIC DIAGNOSIS:   A. ESOPHAGUS, UPPER, BIOPSY:  - Squamous cell carcinoma.    08/18/2019 Cancer Staging   Staging form: Esophagus - Squamous Cell Carcinoma, AJCC 8th Edition - Clinical: Stage Unknown (cTX, cN1, cM0) - Signed by [name], MD on 08/18/2019   08/29/2019  -  Chemotherapy   Concurrent chemoradiation with weekly CT for 6 weeks starting 08/29/19    08/29/2019 -  Radiation Therapy   Concurrent chemoradiation with Dr Lisbeth Renshaw starting 08/29/19      CURRENT THERAPY:  PENDING Concurrent chemoradiation with weekly CT for 6 weeks starting 08/29/19.   INTERVAL HISTORY:  Jimmy Hawkins is here for a follow up as outpatient. He presents to the clinic with his sister, wife and Spanish interpretor. Since hospital discharge he has been fair. He has pain across his upper abdomen. His pain medication, Hycet helps moderately. He has been taking it every 8 hours. Pain will go away for 5 hours before returning. He note she is taking oral iron. He notes he was able to gain a few pounds hospital discharge.    REVIEW OF SYSTEMS:   Constitutional: Denies fevers, chills or abnormal weight loss Eyes: Denies blurriness of vision Ears, nose, mouth, throat, and face: Denies mucositis or sore throat Respiratory: Denies cough, dyspnea or wheezes Cardiovascular: Denies palpitation, chest discomfort or lower extremity swelling Gastrointestinal:  Denies nausea, heartburn or change in bowel habits Skin: Denies abnormal skin rashes Lymphatics: Denies new lymphadenopathy or easy bruising Neurological:Denies numbness, tingling or new weaknesses Behavioral/Psych: Mood is stable, no new changes  All other systems were reviewed with the patient and are negative.  MEDICAL HISTORY:  Past Medical History:  Diagnosis Date   GERD (gastroesophageal reflux disease)    Hematemesis with nausea 08/10/2019    SURGICAL HISTORY: Past Surgical History:  Procedure Laterality Date   BIOPSY  08/11/2019   Procedure: BIOPSY;  Surgeon: [name], MD;  Location:  [facility];  Service: Endoscopy;;   ESOPHAGOGASTRODUODENOSCOPY  08/11/2019   ESOPHAGOGASTRODUODENOSCOPY (EGD) WITH PROPOFOL N/A 08/11/2019   Procedure: ESOPHAGOGASTRODUODENOSCOPY (EGD) WITH PROPOFOL;  Surgeon:  [name], MD;  Location: [facility];  Service: Endoscopy;  Laterality: N/A;    I have reviewed the social history and family history with the patient and they are unchanged from previous note.  ALLERGIES:  has No Known Allergies.  MEDICATIONS:  Current Outpatient Medications  Medication Sig Dispense Refill   Ferrous Sulfate (IRON) 325 (65 Fe) MG TABS Take 1 tablet (325 mg total) by mouth 2 (two) times daily. 60 tablet 0   HYDROcodone-acetaminophen (HYCET) 7.5-325 mg/15 ml solution Take 5-10 mLs by mouth every 6 (six) hours as needed for moderate pain. 120 mL 0   omeprazole (PRILOSEC) 40 MG capsule Take 1 capsule (40 mg total) by mouth daily. 30 capsule 0   No current facility-administered medications for this visit.    PHYSICAL EXAMINATION: ECOG PERFORMANCE STATUS: 1 - Symptomatic but completely ambulatory  Vitals:   08/18/19 1230  BP: 124/77  Pulse: 91  Resp: 18  Temp: 98.2 F (36.8 C)  SpO2: 100%   Filed Weights   08/18/19 1230  Weight: 165 lb 4.8 oz (75 kg)    GENERAL:alert, no distress and comfortable SKIN: skin color, texture, turgor are normal, no rashes or significant lesions EYES: normal, Conjunctiva are pink and non-injected, sclera clear NECK: supple, thyroid normal size, non-tender, without nodularity LYMPH:  no palpable lymphadenopathy in the cervical, axillary  LUNGS: clear to auscultation and percussion with normal breathing effort HEART: regular rate & rhythm and no murmurs and no lower extremity edema ABDOMEN:abdomen soft, non-tender and normal bowel sounds Musculoskeletal:no cyanosis of digits and no clubbing  NEURO: alert & oriented x 3 with fluent speech, no focal motor/sensory deficits  LABORATORY DATA:  I have reviewed the data as listed CBC Latest Ref Rng & Units 08/12/2019 08/12/2019 08/11/2019  WBC 4.0 - 10.5 K/uL - 5.7 6.4  Hemoglobin 13.0 - 17.0 g/dL 8.5(L) 7.6(L) 8.2(L)  Hematocrit 39 - 52 % 27.1(L) 25.0(L) 26.4(L)  Platelets 150  - 400 K/uL - 195 191     CMP Latest Ref Rng & Units 08/12/2019 08/11/2019 08/11/2019  Glucose 70 - 99 mg/dL 117(H) 127(H) 127(H)  BUN 6 - 20 mg/dL 6 12 12   Creatinine 0.61 - 1.24 mg/dL 0.54(L) 0.64 0.64  Sodium 135 - 145 mmol/L 136 136 137  Potassium 3.5 - 5.1 mmol/L 3.6 4.0 4.0  Chloride 98 - 111 mmol/L 103 106 106  CO2 22 - 32 mmol/L 25 22 22   Calcium 8.9 - 10.3 mg/dL 8.3(L) 7.7(L) 7.9(L)  Total Protein 6.5 - 8.1 g/dL - 6.1(L) 6.1(L)  Total Bilirubin 0.3 - 1.2 mg/dL - 0.8 0.9  Alkaline Phos 38 - 126 U/L - 43 43  AST 15 - 41 U/L - 31 29  ALT 0 - 44 U/L - 20 19      RADIOGRAPHIC STUDIES: I have personally reviewed the radiological images as listed and agreed with the findings in the report. No results found.   ASSESSMENT & PLAN:  Jimmy Hawkins is a 44 y.o. male with    1. Middle Esophageal Squamous Cell Carcinoma, cTxN1M0 -he presented with 6 months of intermittent hematemesis and progressive Dysphagia and Upper abdominal pain. 08/11/19 CT CAP found him to have mid esophageal mass and enlarged AP window node, no distant metastasis. EGD on 08/11/19 biopsy showed Squamous Cell Carcinoma. I reviewed this with patient  and his family.  -I discussed treatment options for locally advanced esophageal cancer, including neoadjuvant concurrent chemoradiation, followed by esophagectomy, and adjuvant immunotherapy. The logistics, side effects and treatment course were discussed with patient and his family in details. He agreed to proceed.  -I discussed his concurrent chemoRT will include weekly IV Carboplatin and Taxol on days of Radiation for 6 weeks.   --Chemotherapy consent: Side effects including but does not limited to, fatigue, nausea, vomiting, diarrhea, hair loss, neuropathy, fluid retention, renal and kidney dysfunction, neutropenic fever, needed for blood transfusion, bleeding, were discussed with patient in great detail. He agrees to proceed. -the goal of chemo is curative    -He will proceed with chemo education class before start of treatment. I discussed with him and his wife to use contraception with sexual intercourse while on chemo and to avoid pregnancy. They do not plan to have more children, they voiced good understanding.  -He consulted with Dr Lisbeth Renshaw today, plans to start in the next 2 weeks after PET scan.  -I will refer him to surgeon before he completed chemoRT.  F/u in 2 weeks with start of treatment.   2.  Moderate anemia secondary to GI bleeding and probable iron deficiency -He has had recurrent Nausea and vomiting with hematemesis since 01/2019.  -He required blood transfusion on 08/15/19 and IV Feraheme on 08/12/19.  -Given he does not have insurance, will start paperwork for free Feraheme drug coverage.   -Continue oral iron.   3.  Heavy alcohol user -we discussed alcohol cessation, he agrees to try   4. Dysphagia, Nutrition, Weight Loss, Abdominal pain  -He reports progressive dysphagia with solid food for the past months, he is currently on soft diet. He developed intermittent upper abdominal pain for the past few weeks.  -He is still tolerating oral soft diet well, I do not think he needs a feeding tube at this point. Will monitor during chemoRT.  -His upper abdominal pain is currently controlled on Hycet q6hours. He takes 2-3 times a day. I discussed this is a controlled substance and he should keep track of it. When his pain improves, he can stop this.  -I discussed pain medication can lead to constipation. I suggest he use Miralax as needed.  -Since hospital discharge he has been able to gain a few pounds. He will proceed with dietician consult next week.    5. Social and Financial Support  -Patient does not have insurance. He is also undocumented. -I encouraged him to meet with his financial advocate Shauna and SW to see if he qualifies insurance application, or if he can get financial assistance from [place].    PLAN:  -Chemo education  class next week  -Lab, F/u and Chemo CT weekly X6 starting 08/29/19.  -Proceed with Radiation starting on 8/2 -I discussed with rad/onc, due to his lack of insurance coverage, we will not obtain PET scan   -iv feraheme next week    No problem-specific Assessment & Plan notes found for this encounter.   No orders of the defined types were placed in this encounter.  All questions were answered. The patient knows to call the clinic with any problems, questions or concerns. No barriers to learning was detected. The total time spent in the appointment was 40 minutes.     [name], MD 08/18/2019   I, Joslyn Devon, am acting as scribe for [name], MD.   I have reviewed the above documentation for accuracy and completeness, and I agree  with the above.

## 2019-08-17 ENCOUNTER — Other Ambulatory Visit: Payer: Self-pay

## 2019-08-17 NOTE — Progress Notes (Signed)
GI Location of Tumor / Histology: Esophageal Mass- Upper/Middle  Jimmy Hawkins presented with recurrent hematemesis.  First episode was in January 2021, mild and he did not seek medical attention.  He subsequently had a 5-6 episodes of mild nausea, vomiting with hematemesis in the past 6 months.  He reports progressive dysphagia with solid food for the past months, he is currently on soft diet.  PET   CT CAP 08/11/2019: Mid to lower esophageal mass, borderline enlarged AP window lymph nodes, no evidence of distant metastasis.  Upper Endoscopy 08/11/2019: Large fungating mass in the upper to middle esophagus.  Biopsies of Esophagus 08/11/2019    Past/Anticipated interventions by surgeon, if any:   Past/Anticipated interventions by medical oncology, if any:  Dr. Burr Medico 08/11/2019 -I reviewed his endoscopy and CT scan findings.  This is most consistent with esophageal cancer, biopsy pathology is still pending. -CT scan showed borderline AP window lymph node, this is likely locally advanced disease.  I recommend PET scan after discharge. May not need EUS -I discussed treatment options for locally advanced esophageal cancer, including neoadjuvant concurrent chemoradiation, followed by esophagectomy, and adjuvant immunotherapy. -I will place a dietician consult for nutrition support, we discussed the possibility of feeding tube placement during his chemoradiation.  He is still tolerating oral soft diet well, I do not think he needs a feeding tube at this point. -I will set up his outpatient radiation oncology appointment with Dr. Lisbeth Renshaw next week, and I will see him for follow-up on same day. -Follow-up 08/18/2019  Weight changes, if any: Lost about 10-15 pounds over the past 6 months.  Bowel/Bladder complaints, if any: Increased urination, having to strain to go.  Has some constipation, he is not taking any medication.  Nausea / Vomiting, if any: No vomiting, has occasional  nausea.  Pain issues, if any: He has pain after he eats, especially if he eats fast.  He has the pain across his upper abdomen.   Diet: Appetite is good.  He has changed up the types of foods he eats.  Eating more fruits and veggies, chicken.  SAFETY ISSUES:  Prior radiation? No  Pacemaker/ICD? No  Possible current pregnancy? n/a  Is the patient on methotrexate? No  Current Complaints/Details:

## 2019-08-18 ENCOUNTER — Inpatient Hospital Stay: Payer: Self-pay | Attending: Hematology | Admitting: Hematology

## 2019-08-18 ENCOUNTER — Ambulatory Visit
Admission: RE | Admit: 2019-08-18 | Discharge: 2019-08-18 | Disposition: A | Discharge status: Home / Self Care | Payer: Self-pay | Source: Ambulatory Visit | Attending: Hematology | Admitting: Hematology

## 2019-08-18 ENCOUNTER — Encounter: Payer: Self-pay | Admitting: Radiation Oncology

## 2019-08-18 ENCOUNTER — Encounter: Payer: Self-pay | Admitting: Hematology

## 2019-08-18 ENCOUNTER — Other Ambulatory Visit: Payer: Self-pay | Admitting: Hematology

## 2019-08-18 ENCOUNTER — Other Ambulatory Visit: Payer: Self-pay

## 2019-08-18 ENCOUNTER — Ambulatory Visit
Admission: RE | Admit: 2019-08-18 | Discharge: 2019-08-18 | Disposition: A | Discharge status: Home / Self Care | Payer: Self-pay | Source: Ambulatory Visit | Attending: Radiation Oncology | Admitting: Radiation Oncology

## 2019-08-18 ENCOUNTER — Ambulatory Visit: Payer: Self-pay | Admitting: Hematology

## 2019-08-18 VITALS — BP 135/78 | HR 100 | Temp 98.8°F | Resp 18 | Wt 165.4 lb

## 2019-08-18 VITALS — BP 124/77 | HR 91 | Temp 98.2°F | Resp 18 | Ht 67.0 in | Wt 165.3 lb

## 2019-08-18 DIAGNOSIS — R1310 Dysphagia, unspecified: Secondary | ICD-10-CM | POA: Insufficient documentation

## 2019-08-18 DIAGNOSIS — C154 Malignant neoplasm of middle third of esophagus: Secondary | ICD-10-CM | POA: Insufficient documentation

## 2019-08-18 DIAGNOSIS — K8020 Calculus of gallbladder without cholecystitis without obstruction: Secondary | ICD-10-CM | POA: Insufficient documentation

## 2019-08-18 DIAGNOSIS — C158 Malignant neoplasm of overlapping sites of esophagus: Secondary | ICD-10-CM

## 2019-08-18 DIAGNOSIS — Z923 Personal history of irradiation: Secondary | ICD-10-CM | POA: Insufficient documentation

## 2019-08-18 DIAGNOSIS — Z79899 Other long term (current) drug therapy: Secondary | ICD-10-CM | POA: Insufficient documentation

## 2019-08-18 DIAGNOSIS — R599 Enlarged lymph nodes, unspecified: Secondary | ICD-10-CM | POA: Insufficient documentation

## 2019-08-18 DIAGNOSIS — G893 Neoplasm related pain (acute) (chronic): Secondary | ICD-10-CM | POA: Insufficient documentation

## 2019-08-18 DIAGNOSIS — J90 Pleural effusion, not elsewhere classified: Secondary | ICD-10-CM | POA: Insufficient documentation

## 2019-08-18 DIAGNOSIS — C153 Malignant neoplasm of upper third of esophagus: Secondary | ICD-10-CM

## 2019-08-18 DIAGNOSIS — K922 Gastrointestinal hemorrhage, unspecified: Secondary | ICD-10-CM | POA: Insufficient documentation

## 2019-08-18 DIAGNOSIS — Z9221 Personal history of antineoplastic chemotherapy: Secondary | ICD-10-CM | POA: Insufficient documentation

## 2019-08-18 DIAGNOSIS — K219 Gastro-esophageal reflux disease without esophagitis: Secondary | ICD-10-CM | POA: Insufficient documentation

## 2019-08-18 DIAGNOSIS — K760 Fatty (change of) liver, not elsewhere classified: Secondary | ICD-10-CM | POA: Insufficient documentation

## 2019-08-18 DIAGNOSIS — D500 Iron deficiency anemia secondary to blood loss (chronic): Secondary | ICD-10-CM | POA: Insufficient documentation

## 2019-08-18 MED ORDER — PROCHLORPERAZINE MALEATE 10 MG PO TABS: 10.0000 mg | ORAL_TABLET | Freq: Four times a day (QID) | ORAL | 1 refills | Status: DC | PRN

## 2019-08-18 MED ORDER — ONDANSETRON HCL 8 MG PO TABS: 8.0000 mg | ORAL_TABLET | Freq: Two times a day (BID) | ORAL | 1 refills | Status: DC | PRN

## 2019-08-18 NOTE — Progress Notes (Signed)
Met with uninsured patient/interpreter referred by physician for assistance obtaining oral medication.  Discussed what is needed to apply for grant.  Approved based on emergency for medication only. Gave a copy of the approval letter along with the Outpatient pharmacy information.  Gave my card for contact information on whom to return information to.

## 2019-08-18 NOTE — Progress Notes (Signed)
START ON PATHWAY REGIMEN - Gastroesophageal     Administer weekly during RT:     Paclitaxel      Carboplatin   **Always confirm dose/schedule in your pharmacy ordering system**  Patient Characteristics: Esophageal & GE Junction, Squamous Cell, Preoperative or Nonsurgical Candidate (Clinical Staging), cT2 or Higher or cN+, Surgical Candidate (Up to cT4a) - Preoperative Therapy Histology: Squamous Cell Disease Classification: Esophageal Therapeutic Status: Preoperative or Nonsurgical Candidate (Clinical Staging) AJCC M Category: cM0 AJCC 8 Stage Grouping: Unknown AJCC Grade: G2 AJCC Location: Middle AJCC T Category: cTX AJCC N Category: cN1 Intent of Therapy: Curative Intent, Discussed with Patient

## 2019-08-18 NOTE — Addendum Note (Signed)
Encounter addended by: Cori Razor, RN on: 08/18/2019 1:54 PM  Actions taken: Charge Capture section accepted

## 2019-08-18 NOTE — Progress Notes (Signed)
Radiation Oncology         [phone] ________________________________  Name: Jimmy Hawkins        MRN: [id number]  Date of Service: 08/18/2019 DOB: 11/05/1975  WJ:[id number], No Pcp Per  [name], MD     REFERRING PHYSICIAN: [name], MD   DIAGNOSIS: The primary encounter diagnosis was Malignant neoplasm of middle third of esophagus ([place]). 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   HISTORY OF PRESENT ILLNESS: Jimmy Hawkins is a 44 y.o. male seen at the request of Dr. Burr Medico for a newly diagnosed esophagus cancer. The patient reports he's had progressive dysphagia over the last few months. He presented recently to the hospital with upper abdominal pain and an episode of nausea with hemetemesis and clots. He came in with an Hgb of 6.9 and received 2 units of PRBCs. Dr. Carlean Purl performed EGD on 08/11/19 while hospitalized and noted a partially obstructing mass in the upper and mid esophagus. A biopsy revealed squamous cell carcinoma. CT CAP on 08/11/19 revealed an 8 cm segment of thickening in the mid to lower esophagus with borderline AP window node and no evidence of distant metastatic disease. He had trace bilateral pleural effusions, cholelithiasis, and steatosis of the liver. Small bilateral indirect inguinal hernias were suspected as well. He is seen today to discuss next steps in getting ready to proceed with treatment.    PREVIOUS RADIATION THERAPY: No   PAST MEDICAL HISTORY:  Past Medical History:  Diagnosis Date  . GERD (gastroesophageal reflux disease)   . Hematemesis with nausea 08/10/2019       PAST SURGICAL HISTORY: Past Surgical History:  Procedure Laterality Date  . BIOPSY  08/11/2019   Procedure: BIOPSY;  Surgeon: [name], MD;  Location: [facility] ENDOSCOPY;  Service: Endoscopy;;  . ESOPHAGOGASTRODUODENOSCOPY  08/11/2019  . ESOPHAGOGASTRODUODENOSCOPY (EGD) WITH PROPOFOL N/A 08/11/2019    Procedure: ESOPHAGOGASTRODUODENOSCOPY (EGD) WITH PROPOFOL;  Surgeon: [name], MD;  Location: [facility];  Service: Endoscopy;  Laterality: N/A;     FAMILY HISTORY:  Family History  Problem Relation Age of Onset  . Stomach cancer Mother   . Healthy Father      SOCIAL HISTORY:  reports that he has never smoked. He has never used smokeless tobacco. He reports previous alcohol use of about 6.0 standard drinks of alcohol per week. He reports that he does not use drugs. The patient is originally from [place] and is spanish speaking. He is accompanied by his sister and his wife. He has 3 children, the youngest is at home and is 42 years old. He works in [employer]. His wife cleans homes.    ALLERGIES: Patient has no known allergies.   MEDICATIONS:  Current Outpatient Medications  Medication Sig Dispense Refill  . Ferrous Sulfate (IRON) 325 (65 Fe) MG TABS Take 1 tablet (325 mg total) by mouth 2 (two) times daily. 60 tablet 0  . HYDROcodone-acetaminophen (HYCET) 7.5-325 mg/15 ml solution Take 5-10 mLs by mouth every 6 (six) hours as needed for moderate pain. 120 mL 0  . omeprazole (PRILOSEC) 40 MG capsule Take 1 capsule (40 mg total) by mouth daily. 30 capsule 0   No current facility-administered medications for this encounter.     REVIEW OF SYSTEMS: On review of systems, the patient reports that he is doing well overall since his hospitalization. He continues to eat most soft foods and supplements with protein shakes.  He denies any sternal chest pain, shortness of breath, cough, fevers, chills, night sweats. He's had about 10-15 pounds of unintended weight changes in the 6 months. He denies any bowel or bladder disturbances, and denies abdominal pain, nausea or vomiting currently. He denies any new musculoskeletal or joint aches or pains. A complete review of systems is obtained and is otherwise negative.     PHYSICAL EXAM:  Wt Readings from Last 3 Encounters:   08/18/19 165 lb 6.4 oz (75 kg)  08/12/19 160 lb 11.5 oz (72.9 kg)   Temp Readings from Last 3 Encounters:  08/18/19 98.8 F (37.1 C)  08/12/19 98.9 F (37.2 C) (Oral)  02/17/19 98 F (36.7 C) (Temporal)   BP Readings from Last 3 Encounters:  08/18/19 (!) 135/78  08/12/19 127/73  02/17/19 (!) 145/89   Pulse Readings from Last 3 Encounters:  08/18/19 100  08/12/19 74  02/17/19 99   Pain Assessment Pain Score: 5  Pain Loc: Throat (Throat and across upper abdomen.)/10  In general this is a well appearing Hispanic male in no acute distress. He's alert and oriented x4 and appropriate throughout the examination. Cardiopulmonary assessment is negative for acute distress and he exhibits normal effort.    ECOG = 1  0 - Asymptomatic (Fully active, able to carry on all predisease activities without restriction)  1 - Symptomatic but completely ambulatory (Restricted in physically strenuous activity but ambulatory and able to carry out work of a light or sedentary nature. For example, light housework, office work)  2 - Symptomatic, <50% in bed during the day (Ambulatory and capable of all self care but unable to carry out any work activities. Up and about more than 50% of waking hours)  3 - Symptomatic, >50% in bed, but not bedbound (Capable of only limited self-care, confined to bed or chair 50% or more of waking hours)  4 - Bedbound (Completely disabled. Cannot carry on any self-care. Totally confined to bed or chair)  5 - Death   Eustace Pen MM, Creech RH, Tormey DC, et al. [phone]). "Toxicity and response criteria of the [facility]". [place] Oncol. 5 (6): 649-55    LABORATORY DATA:  Lab Results  Component Value Date   WBC 5.7 08/12/2019   HGB 8.5 (L) 08/12/2019   HCT 27.1 (L) 08/12/2019   MCV 82.5 08/12/2019   PLT 195 08/12/2019   Lab Results  Component Value Date   NA 136 08/12/2019   K 3.6 08/12/2019   CL 103 08/12/2019   CO2 25 08/12/2019    Lab Results  Component Value Date   ALT 19 08/11/2019   ALT 20 08/11/2019   AST 29 08/11/2019   AST 31 08/11/2019   ALKPHOS 43 08/11/2019   ALKPHOS 43 08/11/2019   BILITOT 0.9 08/11/2019   BILITOT 0.8 08/11/2019      RADIOGRAPHY: CT CHEST W CONTRAST  Result Date: 08/11/2019 CLINICAL DATA:  Large thoracic esophageal mass on upper endoscopy today, high suspicion for esophageal malignancy, staging workup. EXAM: CT CHEST, ABDOMEN, AND PELVIS WITH CONTRAST TECHNIQUE: Multidetector CT imaging of the chest, abdomen and pelvis was performed following the standard protocol during bolus administration of intravenous contrast. CONTRAST:  117mL OMNIPAQUE IOHEXOL 300 MG/ML  SOLN COMPARISON:  Chest radiograph 07/13/2009 FINDINGS: CT CHEST FINDINGS Cardiovascular: Unremarkable Mediastinum/Nodes: Fusiform masslike appearance primarily along the lumen of the mid to distal esophagus along an 8 cm segment, the esophagus including this central masslike component measures 6.5 by 3.4 cm  in cross-section on image 35/4, strongly favoring esophageal malignancy. There is wall thickening of the esophagus just distal to this mass with an air-fluid level. Indistinctness of esophageal wall margins particularly along the left side in the vicinity of the mass. AP window lymph node 1.0 cm in short axis, image 24/4. Right eccentric subcarinal node 0.6 cm in short axis, image 32/4. Right upper paratracheal node 0.6 cm in short axis, image 14/4. Aside from the borderline enlarged AP window lymph node, no overtly pathologically enlarged thoracic lymph nodes. Lungs/Pleura: Trace bilateral pleural effusions. Musculoskeletal: Unremarkable CT ABDOMEN PELVIS FINDINGS Hepatobiliary: Diffuse hepatic steatosis. Small gallstones are present in the gallbladder. No appreciable hepatic metastatic lesions identified. No biliary dilatation. Pancreas: Unremarkable Spleen: Unremarkable.  Two small accessory spleens are present. Adrenals/Urinary  Tract: Tiny hypodense lesion in the right kidney lower pole on image 81/4 is likely a cyst but technically too small to characterize. The kidneys appear otherwise normal. The adrenal glands in urinary bladder likewise appear normal. Stomach/Bowel: Unremarkable Vascular/Lymphatic: Right gastric node axis on image 0.7 cm in short 59/4. No overtly pathologic adenopathy. No significant atherosclerotic calcification. Reproductive: Unremarkable Other: No supplemental non-categorized findings. Musculoskeletal: Fatty spermatic cords likely due to small bilateral indirect inguinal hernias. IMPRESSION: 1. Mid to lower esophageal mass along an 8 cm segment, large endoluminal component, strongly favoring esophageal malignancy. Borderline enlarged AP window lymph node. No findings of distant metastatic disease. 2. Trace bilateral pleural effusions. 3. Diffuse hepatic steatosis. 4. Cholelithiasis. 5. Fatty spermatic cords likely due to small bilateral indirect inguinal hernias. Electronically Signed   By: Van Clines M.D.   On: 08/11/2019 16:07   CT ABDOMEN PELVIS W CONTRAST  Result Date: 08/11/2019 CLINICAL DATA:  Large thoracic esophageal mass on upper endoscopy today, high suspicion for esophageal malignancy, staging workup. EXAM: CT CHEST, ABDOMEN, AND PELVIS WITH CONTRAST TECHNIQUE: Multidetector CT imaging of the chest, abdomen and pelvis was performed following the standard protocol during bolus administration of intravenous contrast. CONTRAST:  187mL OMNIPAQUE IOHEXOL 300 MG/ML  SOLN COMPARISON:  Chest radiograph 07/13/2009 FINDINGS: CT CHEST FINDINGS Cardiovascular: Unremarkable Mediastinum/Nodes: Fusiform masslike appearance primarily along the lumen of the mid to distal esophagus along an 8 cm segment, the esophagus including this central masslike component measures 6.5 by 3.4 cm in cross-section on image 35/4, strongly favoring esophageal malignancy. There is wall thickening of the esophagus just distal to  this mass with an air-fluid level. Indistinctness of esophageal wall margins particularly along the left side in the vicinity of the mass. AP window lymph node 1.0 cm in short axis, image 24/4. Right eccentric subcarinal node 0.6 cm in short axis, image 32/4. Right upper paratracheal node 0.6 cm in short axis, image 14/4. Aside from the borderline enlarged AP window lymph node, no overtly pathologically enlarged thoracic lymph nodes. Lungs/Pleura: Trace bilateral pleural effusions. Musculoskeletal: Unremarkable CT ABDOMEN PELVIS FINDINGS Hepatobiliary: Diffuse hepatic steatosis. Small gallstones are present in the gallbladder. No appreciable hepatic metastatic lesions identified. No biliary dilatation. Pancreas: Unremarkable Spleen: Unremarkable.  Two small accessory spleens are present. Adrenals/Urinary Tract: Tiny hypodense lesion in the right kidney lower pole on image 81/4 is likely a cyst but technically too small to characterize. The kidneys appear otherwise normal. The adrenal glands in urinary bladder likewise appear normal. Stomach/Bowel: Unremarkable Vascular/Lymphatic: Right gastric node axis on image 0.7 cm in short 59/4. No overtly pathologic adenopathy. No significant atherosclerotic calcification. Reproductive: Unremarkable Other: No supplemental non-categorized findings. Musculoskeletal: Fatty spermatic cords likely due to small bilateral indirect  inguinal hernias. IMPRESSION: 1. Mid to lower esophageal mass along an 8 cm segment, large endoluminal component, strongly favoring esophageal malignancy. Borderline enlarged AP window lymph node. No findings of distant metastatic disease. 2. Trace bilateral pleural effusions. 3. Diffuse hepatic steatosis. 4. Cholelithiasis. 5. Fatty spermatic cords likely due to small bilateral indirect inguinal hernias. Electronically Signed   By: Van Clines M.D.   On: 08/11/2019 16:07       IMPRESSION/PLAN: 1. 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 Dr. Lisbeth Renshaw discusses the pathology findings and reviews the nature of esophageal cancer. He discusses the rationale to proceed with PET scan and that this will document staging. Based on his CT imaging, he likely would benefit from chemoRT and possibly surgical resection following this. We discussed the risks, benefits, short, and long term effects of radiotherapy, and the patient is interested in proceeding. Dr. Lisbeth Renshaw discusses the delivery and logistics of radiotherapy and anticipates a course of 5 1/2 weeks of radiotherapy to the esophagus with curative intent. We will follow up with the results of his PET scan and once we know the date, also plan for simulation. Written consent is obtained and placed in the chart, a copy was provided to the patient. 2. Painful swallowing. We discussed the use of his hydrocodone and trying to continue soft diet or liquids. He will also meet with the nutritionist at the cancer center.  In a visit lasting 90 minutes, greater than 50% of the time was spent face to face discussing the patient's condition, in preparation for the discussion, and coordinating the patient's care. Communication was assisted by a spanish speaking medical interpretor.    The above documentation reflects my direct findings during this shared patient visit. Please see the separate note by Dr. Lisbeth Renshaw on this date for the remainder of the patient's plan of care.    Carola Rhine, PAC

## 2019-08-19 ENCOUNTER — Other Ambulatory Visit: Payer: Self-pay

## 2019-08-19 ENCOUNTER — Telehealth: Payer: Self-pay | Admitting: Hematology

## 2019-08-19 DIAGNOSIS — C154 Malignant neoplasm of middle third of esophagus: Secondary | ICD-10-CM

## 2019-08-19 NOTE — Telephone Encounter (Signed)
Scheduled per 7/22 los. Noted to give pt appt calendar on next visit.

## 2019-08-22 ENCOUNTER — Inpatient Hospital Stay: Payer: Self-pay

## 2019-08-22 ENCOUNTER — Other Ambulatory Visit: Payer: Self-pay

## 2019-08-22 ENCOUNTER — Other Ambulatory Visit: Payer: Self-pay | Admitting: Internal Medicine

## 2019-08-22 ENCOUNTER — Telehealth: Payer: Self-pay

## 2019-08-22 VITALS — BP 121/72 | HR 87 | Temp 98.4°F | Resp 18

## 2019-08-22 DIAGNOSIS — C154 Malignant neoplasm of middle third of esophagus: Secondary | ICD-10-CM

## 2019-08-22 DIAGNOSIS — D500 Iron deficiency anemia secondary to blood loss (chronic): Secondary | ICD-10-CM

## 2019-08-22 LAB — CBC WITH DIFFERENTIAL (CANCER CENTER ONLY)
Abs Immature Granulocytes: 0.03 10*3/uL (ref 0.00–0.07)
Basophils Absolute: 0.1 10*3/uL (ref 0.0–0.1)
Basophils Relative: 1 %
Eosinophils Absolute: 0.2 10*3/uL (ref 0.0–0.5)
Eosinophils Relative: 3 %
HCT: 32.8 % — ABNORMAL LOW (ref 39.0–52.0)
Hemoglobin: 10.2 g/dL — ABNORMAL LOW (ref 13.0–17.0)
Immature Granulocytes: 0 %
Lymphocytes Relative: 12 %
Lymphs Abs: 1 10*3/uL (ref 0.7–4.0)
MCH: 26.8 pg (ref 26.0–34.0)
MCHC: 31.1 g/dL (ref 30.0–36.0)
MCV: 86.1 fL (ref 80.0–100.0)
Monocytes Absolute: 0.7 10*3/uL (ref 0.1–1.0)
Monocytes Relative: 9 %
Neutro Abs: 6 10*3/uL (ref 1.7–7.7)
Neutrophils Relative %: 75 %
Platelet Count: 358 10*3/uL (ref 150–400)
RBC: 3.81 MIL/uL — ABNORMAL LOW (ref 4.22–5.81)
RDW: 16.8 % — ABNORMAL HIGH (ref 11.5–15.5)
WBC Count: 8 10*3/uL (ref 4.0–10.5)
nRBC: 0 % (ref 0.0–0.2)

## 2019-08-22 LAB — CMP (CANCER CENTER ONLY)
ALT: 24 U/L (ref 0–44)
AST: 22 U/L (ref 15–41)
Albumin: 3 g/dL — ABNORMAL LOW (ref 3.5–5.0)
Alkaline Phosphatase: 68 U/L (ref 38–126)
Anion gap: 11 (ref 5–15)
BUN: 7 mg/dL (ref 6–20)
CO2: 24 mmol/L (ref 22–32)
Calcium: 9.6 mg/dL (ref 8.9–10.3)
Chloride: 102 mmol/L (ref 98–111)
Creatinine: 0.71 mg/dL (ref 0.61–1.24)
GFR, Est AFR Am: >60 mL/min (ref >60)
GFR, Estimated: >60 mL/min (ref >60)
Glucose, Bld: 102 mg/dL — ABNORMAL HIGH (ref 70–99)
Potassium: 3.6 mmol/L (ref 3.5–5.1)
Sodium: 137 mmol/L (ref 135–145)
Total Bilirubin: 0.3 mg/dL (ref 0.3–1.2)
Total Protein: 8.3 g/dL — ABNORMAL HIGH (ref 6.5–8.1)

## 2019-08-22 MED ORDER — SODIUM CHLORIDE 0.9 % IV SOLN
Freq: Once | INTRAVENOUS | Status: AC
  Filled 2019-08-22: qty 250

## 2019-08-22 MED ORDER — HYDROCODONE-ACETAMINOPHEN 7.5-325 MG/15ML PO SOLN: 5.0000 mL | Freq: Four times a day (QID) | ORAL | 0 refills | Status: AC | PRN

## 2019-08-22 MED ORDER — ACETAMINOPHEN 325 MG PO TABS
ORAL_TABLET | ORAL | Status: AC
  Filled 2019-08-22: qty 2

## 2019-08-22 MED ORDER — DIPHENHYDRAMINE HCL 25 MG PO CAPS
ORAL_CAPSULE | ORAL | Status: AC
  Filled 2019-08-22: qty 1

## 2019-08-22 MED ORDER — FAMOTIDINE IN NACL 20-0.9 MG/50ML-% IV SOLN
INTRAVENOUS | Status: AC
  Filled 2019-08-22: qty 50

## 2019-08-22 MED ORDER — FAMOTIDINE IN NACL 20-0.9 MG/50ML-% IV SOLN
20.0000 mg | Freq: Once | INTRAVENOUS | Status: AC
  Administered 2019-08-22: 20 mg via INTRAVENOUS

## 2019-08-22 MED ORDER — ACETAMINOPHEN 325 MG PO TABS
650.0000 mg | ORAL_TABLET | Freq: Once | ORAL | Status: AC
  Administered 2019-08-22: 650 mg via ORAL

## 2019-08-22 MED ORDER — SODIUM CHLORIDE 0.9 % IV SOLN
510.0000 mg | Freq: Once | INTRAVENOUS | Status: AC
  Administered 2019-08-22: 510 mg via INTRAVENOUS
  Filled 2019-08-22: qty 510

## 2019-08-22 MED ORDER — DIPHENHYDRAMINE HCL 25 MG PO CAPS
25.0000 mg | ORAL_CAPSULE | Freq: Once | ORAL | Status: AC
  Administered 2019-08-22: 25 mg via ORAL

## 2019-08-22 NOTE — Telephone Encounter (Signed)
Jimmy Hawkins sister, Jimmy Hawkins called me to the lobby to talk about Jimmy Hawkins's medicine, however there was not a spanish interpeter.  I attempted to contact Jimmy Hawkins at [phone] but she did not answer.  I utilized the [employer] interpreter machine. Interpreter [phone] called Jimmy Hawkins at [phone], the number she provided.  There was no answer.  I left a message that I had called.

## 2019-08-22 NOTE — Patient Instructions (Signed)

## 2019-08-23 ENCOUNTER — Other Ambulatory Visit: Payer: Self-pay

## 2019-08-23 ENCOUNTER — Inpatient Hospital Stay: Payer: Self-pay

## 2019-08-23 NOTE — Progress Notes (Signed)
Nutrition Assessment   Reason for Assessment:  New esophageal cancer   ASSESSMENT:  44 year old male with new esophageal cancer.  Past medical history of etoh use, GERD.  Planning concurrent chemotherapy and radiation starteding 08/29/19.    Met with patient, wife and interpreter Nicole Kindred in clinic this am.  Patient reports that his appetite has improved over the last 15 days and he has been trying to eat well.  Breakfast yesterday ate hot cake with berries and honey and oatmeal mixed with water and apple juice. Lunch was crab legs, herbal life shake and evening was tomato sauce with beans and tortilla and juice.  Eats yogurt, fruit or jello am and pm snack.  Started drinking ensure today.  Does not drink water.  Reports hot/spicy chilies and tortillas are difficult for him to swallow but can tolerate all other foods.     Medications: fe sulfate, prilosec, zofran, compazine   Labs: reviewed   Anthropometrics:   Height: 67 inches Weight: 165 lb UBW: +/-5 pounds from current weight BMI: 25   Estimated Energy Needs  Kcals: 2250-2600 Protein: 112-130 g Fluid: > 2.2 L   NUTRITION DIAGNOSIS: Increased nutrient needs related to cancer diagnosis and starting treatment as evidenced by estimated needs    INTERVENTION:  Discussed importance of nutrition during treatment Discussed importance of protein rich and high calorie foods.  Provided spanish food list of high protein foods.  Discussed may need to chop foods, add moisture, gravies, sauces to foods to make them moist and easy to swallow.   Discussed oral nutrition supplements.  Samples given of ensure enlive, boost plus and orgain.  1st complimentary case of ensure enlive given to patient today.  Contact information provided.   MONITORING, EVALUATION, GOAL: weight trends, intake   Next Visit: Monday, August 16 during infusion  Shanterica Biehler B. Zenia Resides, [place], [place] Registered Dietitian [phone] (mobile)

## 2019-08-24 ENCOUNTER — Telehealth: Payer: Self-pay | Admitting: General Practice

## 2019-08-24 ENCOUNTER — Other Ambulatory Visit: Payer: Self-pay

## 2019-08-24 ENCOUNTER — Ambulatory Visit
Admission: RE | Admit: 2019-08-24 | Discharge: 2019-08-24 | Disposition: A | Discharge status: Home / Self Care | Payer: Self-pay | Source: Ambulatory Visit | Attending: Radiation Oncology | Admitting: Radiation Oncology

## 2019-08-24 DIAGNOSIS — C154 Malignant neoplasm of middle third of esophagus: Secondary | ICD-10-CM | POA: Insufficient documentation

## 2019-08-24 NOTE — Telephone Encounter (Signed)
[facility] CSW Progress Notes  Call to [employer] for status update.  Burnetta Sabin, Estate manager/land agent, referred patient to [employer] for help in applying for Medicaid.  They have tried to reach patient and have been unable to do so.  CSW will attempt to contact using spanish interpreter to ensure he is aware of what he needs to do.  Called w help of [employer] [phone]).  Explained that [employer]/[place] [phone]) has been trying to reach him, asked him to return her call.  He does not have SSN and does not wish to proceed w referral to [facility] for help w disability application.  He states sister is coming to [facility] w documentation for the [employer] - this will allow him to access medications at the [facility] and meet some small living expenses.  Encouraged patient to request help from SW if needed during the course of his treatment.      Edwyna Shell, LCSW Clinical Social Worker Phone:  [phone] Cell:  [phone]

## 2019-08-25 NOTE — Telephone Encounter (Signed)
Error

## 2019-08-27 ENCOUNTER — Other Ambulatory Visit: Payer: Self-pay

## 2019-08-27 ENCOUNTER — Encounter (HOSPITAL_COMMUNITY): Payer: Self-pay | Admitting: Emergency Medicine

## 2019-08-27 ENCOUNTER — Emergency Department (HOSPITAL_COMMUNITY)
Admission: EM | Admit: 2019-08-27 | Discharge: 2019-08-27 | Disposition: A | Discharge status: Home / Self Care | Payer: Self-pay | Attending: Emergency Medicine | Admitting: Emergency Medicine

## 2019-08-27 DIAGNOSIS — G893 Neoplasm related pain (acute) (chronic): Secondary | ICD-10-CM | POA: Insufficient documentation

## 2019-08-27 DIAGNOSIS — R1310 Dysphagia, unspecified: Secondary | ICD-10-CM | POA: Insufficient documentation

## 2019-08-27 DIAGNOSIS — K219 Gastro-esophageal reflux disease without esophagitis: Secondary | ICD-10-CM | POA: Insufficient documentation

## 2019-08-27 MED ORDER — HYDROCODONE-ACETAMINOPHEN 7.5-325 MG/15ML PO SOLN
10.0000 mL | Freq: Once | ORAL | Status: AC
  Administered 2019-08-27: 10 mL via ORAL
  Filled 2019-08-27: qty 15

## 2019-08-27 MED ORDER — HYDROCODONE-ACETAMINOPHEN 10-325 MG/15ML PO SOLN: 7.5000 mL | Freq: Three times a day (TID) | ORAL | 0 refills | Status: DC | PRN

## 2019-08-27 NOTE — Discharge Instructions (Signed)
Please have your cancer doctor manage your pain. If they are uncomfortable, request palliative care consult for pain control.

## 2019-08-27 NOTE — ED Triage Notes (Addendum)
Patient wants a refill of liquid hydrocodone with acetaminophen. Patient states that helps his pain. He states it has ran out and he needs it to be able to eat. Patient has esophageal cancer. Patient was given the medication to help with his abdominal pain.

## 2019-08-28 ENCOUNTER — Telehealth (HOSPITAL_COMMUNITY): Payer: Self-pay | Admitting: Emergency Medicine

## 2019-08-28 MED ORDER — HYDROCODONE-ACETAMINOPHEN 10-325 MG/15ML PO SOLN: 7.5000 mL | Freq: Three times a day (TID) | ORAL | 0 refills | Status: DC | PRN

## 2019-08-28 NOTE — Telephone Encounter (Signed)
Prescription changed to different pharmacy.

## 2019-08-28 NOTE — ED Provider Notes (Signed)
[facility] DEPT Provider Note   CSN: [id number] Arrival date & time: 08/27/19  1957     History Chief Complaint  Patient presents with  . Abdominal Pain    Jimmy Hawkins is a 44 y.o. male.  HPI     44 year old male comes in a chief complaint of abdominal pain.  Patient has history of alcoholism and was diagnosed with esophageal cancer recently.  He was discharged with oral hydrocodone.  Patient has run out of his medication and is complaining of pain.  He is going to get chemotherapy starting next week.  He expects to discuss pain management with his oncologist. Past Medical History:  Diagnosis Date  . GERD (gastroesophageal reflux disease)   . Hematemesis with nausea 08/10/2019    Patient Active Problem List   Diagnosis Date Noted  . Primary malignant neoplasm of overlapping sites of esophagus ([place]) 08/18/2019  . Iron deficiency anemia 08/12/2019  . Upper GI bleed 08/11/2019  . Malignant neoplasm of middle third of esophagus ([place])   . Hematemesis with nausea 08/10/2019  . Syncope, vasovagal 08/10/2019  . Acute blood loss anemia 08/10/2019  . Alcohol abuse     Past Surgical History:  Procedure Laterality Date  . BIOPSY  08/11/2019   Procedure: BIOPSY;  Surgeon: [name], MD;  Location: [facility] ENDOSCOPY;  Service: Endoscopy;;  . ESOPHAGOGASTRODUODENOSCOPY  08/11/2019  . ESOPHAGOGASTRODUODENOSCOPY (EGD) WITH PROPOFOL N/A 08/11/2019   Procedure: ESOPHAGOGASTRODUODENOSCOPY (EGD) WITH PROPOFOL;  Surgeon: [name], MD;  Location: [facility];  Service: Endoscopy;  Laterality: N/A;       Family History  Problem Relation Age of Onset  . Stomach cancer Mother   . Healthy Father     Social History   Tobacco Use  . Smoking status: Never Smoker  . Smokeless tobacco: Never Used  Vaping Use  . Vaping Use: Never used  Substance Use Topics  . Alcohol use: Not Currently    Alcohol/week: 6.0 standard drinks    Types:  6 Cans of beer per week    Comment:  6 drinks/day  . Drug use: Never    Home Medications Prior to Admission medications   Medication Sig Start Date End Date Taking? Authorizing Provider  Ferrous Sulfate (IRON) 325 (65 Fe) MG TABS Take 1 tablet (325 mg total) by mouth 2 (two) times daily. 08/12/19 09/11/19  Darliss Cheney, MD  HYDROcodone-Acetaminophen 10-325 MG/15ML SOLN Take 7.5 mLs by mouth 3 (three) times daily as needed for up to 3 days. 08/28/19 08/31/19  Maudie Flakes, MD  omeprazole (PRILOSEC) 40 MG capsule Take 1 capsule (40 mg total) by mouth daily. 08/12/19 09/11/19  Darliss Cheney, MD  ondansetron (ZOFRAN) 8 MG tablet Take 1 tablet (8 mg total) by mouth 2 (two) times daily as needed for refractory nausea / vomiting. Start on day 3 after chemo. 08/18/19   [name], MD  prochlorperazine (COMPAZINE) 10 MG tablet Take 1 tablet (10 mg total) by mouth every 6 (six) hours as needed (Nausea or vomiting). 08/18/19   [name], MD    Allergies    Patient has no known allergies.  Review of Systems   Review of Systems  Constitutional: Negative for activity change.  HENT: Positive for trouble swallowing.     Physical Exam Updated Vital Signs BP 126/82 (BP Location: Right Arm)   Pulse 91   Temp 99.3 F (37.4 C) (Oral)   Resp 16   Ht 5\' 6"  (1.676 m)  Wt 73.9 kg   SpO2 100%   BMI 26.31 kg/m   Physical Exam Vitals and nursing note reviewed.  Constitutional:      Appearance: He is well-developed.  HENT:     Head: Atraumatic.  Cardiovascular:     Rate and Rhythm: Normal rate.  Pulmonary:     Effort: Pulmonary effort is normal.  Musculoskeletal:     Cervical back: Neck supple.  Skin:    General: Skin is warm.  Neurological:     Mental Status: He is alert and oriented to person, place, and time.     ED Results / Procedures / Treatments   Labs (all labs ordered are listed, but only abnormal results are displayed) Labs Reviewed - No data to display  EKG None  Radiology No  results found.  Procedures Procedures (including critical care time)  Medications Ordered in ED Medications  HYDROcodone-acetaminophen (HYCET) 7.5-325 mg/15 ml solution 10 mL (10 mLs Oral Given 08/27/19 2254)    ED Course  I have reviewed the triage vital signs and the nursing notes.  Pertinent labs & imaging results that were available during my care of the patient were reviewed by me and considered in my medical decision making (see chart for details).    MDM Rules/Calculators/A&P                          44 year old with recent diagnosis of esophageal cancer comes in a chief complaint of pain.  Patient is out of his pain meds that were prescribed by his oncologist.  [facility] controlled substance registry does not reveal any other narcotic usage.  We will give him 3 days of supply and expect him to see his oncologist for refill and outpatient pain control.  Final Clinical Impression(s) / ED Diagnoses Final diagnoses:  Cancer associated pain    Rx / DC Orders ED Discharge Orders         Ordered    HYDROcodone-Acetaminophen 10-325 MG/15ML SOLN  3 times daily PRN,   Status:  Discontinued     Reprint     08/27/19 Palmer, Deiondre Harrower, MD 08/28/19 1755

## 2019-08-29 ENCOUNTER — Other Ambulatory Visit: Payer: Self-pay

## 2019-08-29 ENCOUNTER — Inpatient Hospital Stay (HOSPITAL_BASED_OUTPATIENT_CLINIC_OR_DEPARTMENT_OTHER): Payer: Self-pay | Admitting: Nurse Practitioner

## 2019-08-29 ENCOUNTER — Encounter: Payer: Self-pay | Admitting: Nurse Practitioner

## 2019-08-29 ENCOUNTER — Ambulatory Visit
Admission: RE | Admit: 2019-08-29 | Discharge: 2019-08-29 | Disposition: A | Discharge status: Home / Self Care | Payer: Self-pay | Source: Ambulatory Visit | Attending: Radiation Oncology | Admitting: Radiation Oncology

## 2019-08-29 ENCOUNTER — Inpatient Hospital Stay: Payer: Self-pay | Attending: Hematology

## 2019-08-29 ENCOUNTER — Other Ambulatory Visit: Payer: Self-pay | Admitting: Oncology

## 2019-08-29 ENCOUNTER — Inpatient Hospital Stay: Payer: Self-pay

## 2019-08-29 VITALS — BP 116/77 | HR 94 | Temp 99.1°F | Resp 16 | Ht 66.0 in | Wt 165.4 lb

## 2019-08-29 VITALS — BP 110/70 | HR 81 | Temp 99.8°F | Resp 17

## 2019-08-29 DIAGNOSIS — R634 Abnormal weight loss: Secondary | ICD-10-CM | POA: Insufficient documentation

## 2019-08-29 DIAGNOSIS — J90 Pleural effusion, not elsewhere classified: Secondary | ICD-10-CM | POA: Insufficient documentation

## 2019-08-29 DIAGNOSIS — C154 Malignant neoplasm of middle third of esophagus: Secondary | ICD-10-CM

## 2019-08-29 DIAGNOSIS — Z5111 Encounter for antineoplastic chemotherapy: Secondary | ICD-10-CM | POA: Insufficient documentation

## 2019-08-29 DIAGNOSIS — D500 Iron deficiency anemia secondary to blood loss (chronic): Secondary | ICD-10-CM | POA: Insufficient documentation

## 2019-08-29 DIAGNOSIS — Z79899 Other long term (current) drug therapy: Secondary | ICD-10-CM | POA: Insufficient documentation

## 2019-08-29 DIAGNOSIS — C158 Malignant neoplasm of overlapping sites of esophagus: Secondary | ICD-10-CM | POA: Insufficient documentation

## 2019-08-29 DIAGNOSIS — R6884 Jaw pain: Secondary | ICD-10-CM | POA: Insufficient documentation

## 2019-08-29 DIAGNOSIS — R1310 Dysphagia, unspecified: Secondary | ICD-10-CM | POA: Insufficient documentation

## 2019-08-29 DIAGNOSIS — K8020 Calculus of gallbladder without cholecystitis without obstruction: Secondary | ICD-10-CM | POA: Insufficient documentation

## 2019-08-29 DIAGNOSIS — K760 Fatty (change of) liver, not elsewhere classified: Secondary | ICD-10-CM | POA: Insufficient documentation

## 2019-08-29 DIAGNOSIS — R1010 Upper abdominal pain, unspecified: Secondary | ICD-10-CM | POA: Insufficient documentation

## 2019-08-29 DIAGNOSIS — G629 Polyneuropathy, unspecified: Secondary | ICD-10-CM | POA: Insufficient documentation

## 2019-08-29 LAB — CMP (CANCER CENTER ONLY)
ALT: 12 U/L (ref 0–44)
AST: 14 U/L — ABNORMAL LOW (ref 15–41)
Albumin: 3 g/dL — ABNORMAL LOW (ref 3.5–5.0)
Alkaline Phosphatase: 74 U/L (ref 38–126)
Anion gap: 9 (ref 5–15)
BUN: 8 mg/dL (ref 6–20)
CO2: 25 mmol/L (ref 22–32)
Calcium: 9.6 mg/dL (ref 8.9–10.3)
Chloride: 102 mmol/L (ref 98–111)
Creatinine: 0.7 mg/dL (ref 0.61–1.24)
GFR, Est AFR Am: >60 mL/min (ref >60)
GFR, Estimated: >60 mL/min (ref >60)
Glucose, Bld: 91 mg/dL (ref 70–99)
Potassium: 3.7 mmol/L (ref 3.5–5.1)
Sodium: 136 mmol/L (ref 135–145)
Total Bilirubin: 0.3 mg/dL (ref 0.3–1.2)
Total Protein: 8 g/dL (ref 6.5–8.1)

## 2019-08-29 LAB — CBC WITH DIFFERENTIAL (CANCER CENTER ONLY)
Abs Immature Granulocytes: 0.03 10*3/uL (ref 0.00–0.07)
Basophils Absolute: 0 10*3/uL (ref 0.0–0.1)
Basophils Relative: 0 %
Eosinophils Absolute: 0.2 10*3/uL (ref 0.0–0.5)
Eosinophils Relative: 3 %
HCT: 33.1 % — ABNORMAL LOW (ref 39.0–52.0)
Hemoglobin: 10.4 g/dL — ABNORMAL LOW (ref 13.0–17.0)
Immature Granulocytes: 0 %
Lymphocytes Relative: 16 %
Lymphs Abs: 1.2 10*3/uL (ref 0.7–4.0)
MCH: 27.1 pg (ref 26.0–34.0)
MCHC: 31.4 g/dL (ref 30.0–36.0)
MCV: 86.2 fL (ref 80.0–100.0)
Monocytes Absolute: 1 10*3/uL (ref 0.1–1.0)
Monocytes Relative: 14 %
Neutro Abs: 5 10*3/uL (ref 1.7–7.7)
Neutrophils Relative %: 67 %
Platelet Count: 333 10*3/uL (ref 150–400)
RBC: 3.84 MIL/uL — ABNORMAL LOW (ref 4.22–5.81)
RDW: 17.2 % — ABNORMAL HIGH (ref 11.5–15.5)
WBC Count: 7.4 10*3/uL (ref 4.0–10.5)
nRBC: 0 % (ref 0.0–0.2)

## 2019-08-29 MED ORDER — DIPHENHYDRAMINE HCL 50 MG/ML IJ SOLN
50.0000 mg | Freq: Once | INTRAMUSCULAR | Status: AC
  Administered 2019-08-29: 50 mg via INTRAVENOUS

## 2019-08-29 MED ORDER — HYDROCODONE-ACETAMINOPHEN 5-325 MG PO TABS
2.0000 | ORAL_TABLET | Freq: Once | ORAL | Status: DC
  Administered 2019-08-29: 2 via ORAL

## 2019-08-29 MED ORDER — PALONOSETRON HCL INJECTION 0.25 MG/5ML
0.2500 mg | Freq: Once | INTRAVENOUS | Status: AC
  Administered 2019-08-29: 0.25 mg via INTRAVENOUS

## 2019-08-29 MED ORDER — OXYCODONE-ACETAMINOPHEN 5-325 MG PO TABS: 1.0000 | ORAL_TABLET | ORAL | 0 refills | Status: DC | PRN

## 2019-08-29 MED ORDER — HYDROCODONE-ACETAMINOPHEN 5-325 MG PO TABS
ORAL_TABLET | ORAL | Status: AC
  Filled 2019-08-29: qty 2

## 2019-08-29 MED ORDER — PALONOSETRON HCL INJECTION 0.25 MG/5ML
INTRAVENOUS | Status: AC
  Filled 2019-08-29: qty 5

## 2019-08-29 MED ORDER — HYDROCODONE-ACETAMINOPHEN 10-325 MG/15ML PO SOLN: 15.0000 mL | Freq: Three times a day (TID) | ORAL | 0 refills | Status: DC | PRN

## 2019-08-29 MED ORDER — FAMOTIDINE IN NACL 20-0.9 MG/50ML-% IV SOLN
INTRAVENOUS | Status: AC
  Filled 2019-08-29: qty 50

## 2019-08-29 MED ORDER — SODIUM CHLORIDE 0.9 % IV SOLN
Freq: Once | INTRAVENOUS | Status: AC
  Filled 2019-08-29: qty 250

## 2019-08-29 MED ORDER — SODIUM CHLORIDE 0.9 % IV SOLN
50.0000 mg/m2 | Freq: Once | INTRAVENOUS | Status: AC
  Administered 2019-08-29: 96 mg via INTRAVENOUS
  Filled 2019-08-29: qty 16

## 2019-08-29 MED ORDER — SODIUM CHLORIDE 0.9 % IV SOLN
300.0000 mg | Freq: Once | INTRAVENOUS | Status: AC
  Administered 2019-08-29: 300 mg via INTRAVENOUS
  Filled 2019-08-29: qty 30

## 2019-08-29 MED ORDER — SODIUM CHLORIDE 0.9 % IV SOLN
20.0000 mg | Freq: Once | INTRAVENOUS | Status: AC
  Administered 2019-08-29: 20 mg via INTRAVENOUS
  Filled 2019-08-29: qty 20

## 2019-08-29 MED ORDER — FAMOTIDINE IN NACL 20-0.9 MG/50ML-% IV SOLN
20.0000 mg | Freq: Once | INTRAVENOUS | Status: AC
  Administered 2019-08-29: 20 mg via INTRAVENOUS

## 2019-08-29 MED ORDER — DIPHENHYDRAMINE HCL 50 MG/ML IJ SOLN
INTRAMUSCULAR | Status: AC
  Filled 2019-08-29: qty 1

## 2019-08-29 NOTE — Progress Notes (Unsigned)
Iwas called by coverage nurse to let me know the patient's pharmacy was out of the liquid narcotic he receives for pain; I wrote for percocet 30 tabs to tide the patient over until they can obtain that medication.

## 2019-08-29 NOTE — Progress Notes (Signed)
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 Mid to lower esophageal mass along an 8 cm segment, large endoluminal component, strongly favoring esophageal malignancy. Borderline enlarged AP window lymph node. No findings of distant metastatic disease. 2. Trace bilateral pleural effusions. 3. Diffuse hepatic steatosis. 4. Cholelithiasis. 5. Fatty spermatic cords likely due to small bilateral indirect inguinal hernias.   08/11/2019 Procedure   EGD by Dr Carlean Purl  IMPRESSION - Partially obstructing, likely malignant esophageal tumor was found in the upper third of the esophagus and in the middle third of the esophagus. Biopsied. Very large and long - difficult but able to advance scope by this lesion - await CT for better length estimate - Normal stomach. - Normal examined duodenum.   08/11/2019 Initial Biopsy   FINAL MICROSCOPIC DIAGNOSIS:   A. ESOPHAGUS, UPPER, BIOPSY:  - Squamous cell carcinoma.    08/18/2019 Cancer Staging   Staging form: Esophagus - Squamous Cell Carcinoma, AJCC 8th Edition - Clinical: Stage Unknown (cTX, cN1, cM0) - Signed by Jimmy Merle, MD on 08/18/2019   08/29/2019 -  Chemotherapy     Concurrent chemoradiation with weekly CT for 6 weeks starting 08/29/19    08/29/2019 -  Radiation Therapy   Concurrent chemoradiation with Dr Jimmy Hawkins starting 08/29/19   08/29/2019 -  Chemotherapy   The patient had dexamethasone (DECADRON) 4 MG tablet, 8 mg, Oral, Daily, 0 of 1 cycle, Start date: --, End date: -- palonosetron (ALOXI) injection 0.25 mg, 0.25 mg, Intravenous,  Once, 0 of 1 cycle CARBOplatin (PARAPLATIN) in sodium chloride 0.9 % 100 mL chemo infusion, , Intravenous,  Once, 0 of 1 cycle PACLitaxel (TAXOL) 96 mg in sodium chloride 0.9 % 250 mL chemo infusion (</= 98m/m2), 50 mg/m2, Intravenous,  Once, 0 of 1 cycle  for chemotherapy treatment.      CURRENT THERAPY: Pending start of ccRT with Taxol and carbo starting 08/29/2019  INTERVAL HISTORY: Jimmy Hawkins for follow-up to start treatment as scheduled.  He was last seen 08/18/2019.  He received 2 infusions of IV Feraheme in the interim.  He was seen in the ED on 7/31 for pain after he ran out of prescribed Norco.  The ED note indicates a prescription was sent but he could not fill it.  Last dose of pain medicine other than Tylenol was in the ED on 7/31.  Pain in the lower chest and upper abdomen is a 7/10 currently.  Hycet has helped but he runs out.  Pain is worse at night and after meals, stable overall.  Food is still getting stuck, eats small portions of any food he wants in small quantities, able to eat solids after heavy chewing.  Able to swallow medication.  Denies other pain, nausea, vomiting,  constipation, diarrhea, fever, chills, cough, shortness of breath, or other issues.    MEDICAL HISTORY:  Past Medical History:  Diagnosis Date  . GERD (gastroesophageal reflux disease)   . Hematemesis with nausea 08/10/2019    SURGICAL HISTORY: Past Surgical History:  Procedure Laterality Date  . BIOPSY  08/11/2019   Procedure: BIOPSY;  Surgeon: [name], MD;  Location: [facility] ENDOSCOPY;  Service: Endoscopy;;  .  ESOPHAGOGASTRODUODENOSCOPY  08/11/2019  . ESOPHAGOGASTRODUODENOSCOPY (EGD) WITH PROPOFOL N/A 08/11/2019   Procedure: ESOPHAGOGASTRODUODENOSCOPY (EGD) WITH PROPOFOL;  Surgeon: [name], MD;  Location: [place];  Service: Endoscopy;  Laterality: N/A;    I have reviewed the social history and family history with the patient and they are unchanged from previous note.  ALLERGIES:  has No Known Allergies.  MEDICATIONS:  Current Outpatient Medications  Medication Sig Dispense Refill  . Ferrous Sulfate (IRON) 325 (65 Fe) MG TABS Take 1 tablet (325 mg total) by mouth 2 (two) times daily. 60 tablet 0  . HYDROcodone-Acetaminophen 10-325 MG/15ML SOLN Take 15 mLs by mouth every 8 (eight) hours as needed. 473 mL 0  . omeprazole (PRILOSEC) 40 MG capsule Take 1 capsule (40 mg total) by mouth daily. 30 capsule 0  . ondansetron (ZOFRAN) 8 MG tablet Take 1 tablet (8 mg total) by mouth 2 (two) times daily as needed for refractory nausea / vomiting. Start on day 3 after chemo. 30 tablet 1  . prochlorperazine (COMPAZINE) 10 MG tablet Take 1 tablet (10 mg total) by mouth every 6 (six) hours as needed (Nausea or vomiting). 30 tablet 1   Current Facility-Administered Medications  Medication Dose Route Frequency Provider Last Rate Last Admin  . HYDROcodone-acetaminophen (NORCO/VICODIN) 5-325 MG per tablet 2 tablet  2 tablet Oral Once Alla Feeling, NP        PHYSICAL EXAMINATION: ECOG PERFORMANCE STATUS: 1 - Symptomatic but completely ambulatory  Vitals:   08/29/19 1050  BP: 116/77  Pulse: 94  Resp: 16  Temp: 99.1 F (37.3 C)  SpO2: 100%   Filed Weights   08/29/19 1050  Weight: 165 lb 6.4 oz (75 kg)    GENERAL:alert, no distress and comfortable SKIN: No rash to exposed skin EYES: sclera clear LUNGS: clear with normal breathing effort HEART: regular rate & rhythm, no lower extremity edema ABDOMEN:abdomen soft, non-tender and normal bowel sounds NEURO: alert & oriented x 3 with fluent  speech, normal gait  LABORATORY DATA:  I have reviewed the data as listed CBC Latest Ref Rng & Units 08/29/2019 08/22/2019 08/12/2019  WBC 4.0 - 10.5 K/uL 7.4 8.0 -  Hemoglobin 13.0 - 17.0 g/dL 10.4(L) 10.2(L) 8.5(L)  Hematocrit 39 - 52 % 33.1(L) 32.8(L) 27.1(L)  Platelets 150 - 400 K/uL 333 358 -     CMP Latest Ref Rng & Units 08/29/2019 08/22/2019 08/12/2019  Glucose 70 - 99 mg/dL 91 102(H) 117(H)  BUN 6 - 20 mg/dL '8 7 6  ' Creatinine 0.61 - 1.24 mg/dL 0.70 0.71 0.54(L)  Sodium 135 - 145 mmol/L 136 137 136  Potassium 3.5 - 5.1 mmol/L 3.7 3.6 3.6  Chloride 98 - 111 mmol/L 102 102 103  CO2 22 - 32 mmol/L '25 24 25  ' Calcium 8.9 - 10.3 mg/dL 9.6 9.6 8.3(L)  Total Protein 6.5 - 8.1 g/dL 8.0 8.3(H) -  Total Bilirubin 0.3 - 1.2 mg/dL 0.3 0.3 -  Alkaline Phos 38 - 126 U/L 74 68 -  AST 15 - 41 U/L 14(L) 22 -  ALT 0 -  44 U/L 12 24 -      RADIOGRAPHIC STUDIES: I have personally reviewed the radiological images as listed and agreed with the findings in the report. No results found.   ASSESSMENT & PLAN: Jimmy Hawkins is a 44 y.o. male with    1. Middle Esophageal Squamous Cell Carcinoma, cTxN1M0 -he presented with 6 months of intermittent hematemesis and progressive Dysphagia and Upper abdominal pain. 08/11/19 CT CAP found him to have mid esophageal mass and enlarged AP window node, no distant metastasis. EGD on 08/11/19 biopsy showed Squamous Cell Carcinoma.   PET scan not done due to self-pay -Beginning neoadjuvant ccRT per Dr Jimmy Hawkins and carbo/Taxol on 08/29/2019.  Goal is curative -Treatment plan includes esophagectomy and adjuvant immunotherapy to follow; will refer to cardiothoracic surgery before he completes radiation  2.Moderate anemia secondary to GI bleeding and probable iron deficiency -He has had recurrent Nausea and vomiting with hematemesis since 01/2019.  -He required blood transfusion on 08/15/19 and IV Feraheme on 08/12/19.    -Continue oral iron.  -Hgb stable  10.4  3.Heavy alcohol user -Previously counseled on alcohol cessation  4. Dysphagia, Nutrition, Weight Loss, Abdominal pain  -He reports progressive dysphagia with solid food for the past months, he is currently on soft diet. He developedintermittent upper abdominal pain for the past few weeks.  -He is still tolerating oral soft diet well, no significant weight loss.  No need for feeding tube at this point.  Will monitor closely during chemo RT  -Pain managed on Hycet every 6-8 hours as needed.  Reviewed narcotics policy  5. Social and Acupuncturist  -Patient does not have insurance. He is also undocumented. -Met with financial advocate Shauna and SW  to discuss grant/assistance  Disposition: Mr. Jerilee Hawkins appears stable.  His dysphagia and lower chest/upper abdominal pain are stable.  Pain is managed on Hycet q6-8 hours PRN, he is out.  We will give Norco x1 in clinic and refill his prescription.  We reviewed the opioid policy. He tolerates a soft diet, no significant weight loss.    We reviewed expected side effects and symptom management on chemoradiation.  CBC and CMP are stable.  Proceed with day 1 Taxol and carboplatin today.  Follow-up weekly on treatment.  All questions were answered. The patient knows to call the clinic with any problems, questions or concerns. No barriers to learning were detected with Spanish interpreter present.     Alla Feeling, NP 08/29/19

## 2019-08-29 NOTE — Addendum Note (Signed)
Addended by: [name] on: 08/29/2019 12:49 PM   Modules accepted: Orders

## 2019-08-29 NOTE — Patient Instructions (Addendum)
[facility] Discharge Instructions for Patients Receiving Chemotherapy  Today you received the following chemotherapy agents: Taxol/Carboplatin  To help prevent nausea and vomiting after your treatment, we encourage you to take your nausea medication as directed.   If you develop nausea and vomiting that is not controlled by your nausea medication, call the clinic.   BELOW ARE SYMPTOMS THAT SHOULD BE REPORTED IMMEDIATELY:  *FEVER GREATER THAN 100.5 F  *CHILLS WITH OR WITHOUT FEVER  NAUSEA AND VOMITING THAT IS NOT CONTROLLED WITH YOUR NAUSEA MEDICATION  *UNUSUAL SHORTNESS OF BREATH  *UNUSUAL BRUISING OR BLEEDING  TENDERNESS IN MOUTH AND THROAT WITH OR WITHOUT PRESENCE OF ULCERS  *URINARY PROBLEMS  *BOWEL PROBLEMS  UNUSUAL RASH Items with * indicate a potential emergency and should be followed up as soon as possible.  Feel free to call the clinic should you have any questions or concerns. The clinic phone number is [phone].  Please show the CHEMO ALERT CARD at check-in to the Emergency Department and triage nurse.  Paclitaxel injection Qu es este medicamento? El PACLITAXEL es un agente quimioteraputico. Este medicamento acta sobre las clulas que se dividen rpidamente, como las clulas cancerosas, y finalmente provoca la muerte de estas clulas. Se utiliza en el tratamiento del cncer de ovario, mama, pulmn, sarcoma de [place] y otros tipos de cncer. Este medicamento puede ser utilizado para otros usos; si tiene alguna pregunta consulte con su proveedor de atencin mdica o con su farmacutico. MARCAS COMUNES: Onxol, Taxol Qu le debo informar a mi profesional de la salud antes de tomar este medicamento? Necesitan saber si usted presenta alguno de los siguientes problemas o situaciones: antecedentes de ritmo cardiaco irregular enfermedad heptica recuentos sanguneos bajos, como baja cantidad de glbulos blancos, plaquetas o glbulos rojos enfermedad  pulmonar o respiratoria, como asma hormigueo en las manos o los pies, u otro trastorno del sistema nervioso una reaccin alrgica o inusual al paclitaxel, al alcohol, al aceite de ricino polioxietilado, a otros medicamentos quimioteraputicos, a otros medicamentos, alimentos, colorantes o conservantes si est embarazada o buscando quedar embarazada si est amamantando a un beb Cmo debo utilizar este medicamento? Este medicamento se administra como infusin en una vena. Un profesional de la salud especialmente capacitado lo administra en un hospital o clnica. Hable con su pediatra para informarse acerca del uso de este medicamento en nios. Puede requerir atencin especial. Sobredosis: Pngase en contacto inmediatamente con un centro toxicolgico o una sala de urgencia si usted cree que haya tomado demasiado medicamento. ATENCIN: Este medicamento es solo para usted. No comparta este medicamento con nadie. Qu sucede si me olvido de una dosis? Es importante no olvidar ninguna dosis. Informe a su mdico o a su profesional de la salud si no puede asistir a una cita. Qu puede interactuar con este medicamento? No tome este medicamento con ninguno de los siguientes frmacos: disulfiram metronidazol Esta medicina tambin puede interactuar con los siguientes medicamentos: medicamentos antivirales para la hepatitis, VIH o SIDA ciertos antibiticos, tales como eritromicina y claritromicina ciertos medicamentos para infecciones micticas, tales como itraconazol y ketoconazol ciertos medicamentos para convulsiones, tales como carbamazepina, fenobarbital y fenitona gemfibrozil nefazodona rifampicina hierba de [place] Puede ser que esta lista no menciona todas las posibles interacciones. Informe a su profesional de la salud de todos los productos a base de hierbas, medicamentos de venta libre o suplementos nutritivos que est tomando. Si usted fuma, consume bebidas alcohlicas o si utiliza drogas ilegales,  indqueselo tambin a su profesional de la salud.   Algunas sustancias pueden interactuar con su medicamento. A qu debo estar atento al usar este medicamento? Se supervisar su estado de salud atentamente mientras reciba este medicamento. Tendr que hacerse anlisis de sangre importantes mientras est tomando este medicamento. Este medicamento puede causar reacciones alrgicas graves. Para reducir su riesgo, necesitar tomar otro(s) medicamento(s) antes del tratamiento con este medicamento. Si tiene reacciones alrgicas como erupcin cutnea, comezn/picazn o urticaria, hinchazn del rostro, los labios, o la lengua, informe de inmediato a su mdico o profesional de la salud. En algunos casos, podra recibir medicamentos adicionales para ayudarlo con los efectos secundarios. Siga todas las instrucciones para usarlos. Este medicamento podra hacerle sentir un malestar general. Esto es normal ya que la quimioterapia afecta tanto a las clulas sanas como a las clulas cancerosas. Si presenta algn efecto secundario, infrmelo. Sin embargo, contine con el tratamiento aun si se siente enfermo, a menos que su mdico le indique que lo suspenda. Consulte a su mdico o a su profesional de la salud si tiene fiebre, escalofros o dolor de garganta, o cualquier otro sntoma de resfro o gripe. No se trate usted mismo. Este medicamento reduce la capacidad del cuerpo para combatir infecciones. Trate de no acercarse a personas que estn enfermas. Este medicamento podra aumentar el riesgo de moretones o sangrado. Consulte a su mdico o a su profesional de la salud si observa sangrados inusuales. Proceda con cuidado al cepillar sus dientes, usar hilo dental o utilizar palillos para los dientes, ya que podra contraer una infeccin o sangrar con mayor facilidad. Si se somete a algn tratamiento dental, informe a su dentista que est usando este medicamento. Evite tomar productos que contienen aspirina, acetaminofeno,  ibuprofeno, naproxeno o ketoprofeno, a menos que as lo indique su mdico. Estos productos pueden ocultar la fiebre. No debe quedar embarazada mientras reciba este medicamento. Las mujeres deben informar a su mdico si estn buscando quedar embarazadas o si creen que estn embarazadas. Existe la posibilidad de efectos secundarios graves en un beb sin nacer. Para obtener ms informacin, hable con su profesional de la salud o su farmacutico. No debe amamantar a un beb mientras est tomando este medicamento. Para los hombres, se desaconseja tener nios mientras reciben este medicamento. Este producto podra contener alcohol. Pregunte a su farmacutico o a su proveedor de atencin de la salud si este medicamento contiene alcohol. Asegrese de decirles a todos los proveedores de atencin de la salud que usted est tomando este medicamento. Ciertos medicamentos, como metronidazol y disulfiram, pueden causar una reaccin desagradable cuando se toman con alcohol. Esta reaccin incluye enrojecimiento, dolor de cabeza, nuseas, vmitos, sudoracin y aumento de la sed. La reaccin puede durar de 30 minutos a varias horas. Qu efectos secundarios puedo tener al utilizar este medicamento? Efectos secundarios que debe informar a su mdico o a su profesional de la salud tan pronto como sea posible: reacciones alrgicas, como erupcin cutnea, comezn/picazn o urticaria, e hinchazn de la cara, los labios o la lengua problemas respiratorios cambios en la visin ritmo cardiaco rpido, irregular presin sangunea alta o baja llagas en la boca dolor, hormigueo o entumecimiento de las manos o los pies signos de disminucin en la cantidad de plaquetas o sangrado: moretones, puntos rojos en la piel, heces de color negro y aspecto alquitranado, sangre en la orina signos de disminucin en la cantidad de glbulos rojos: cansancio o debilidad inusual, sensacin de desmayo o aturdimiento, cadas signos de infeccin: fiebre o  escalofros, tos, dolor de garganta, dolor o dificultad para orinar   signos y sntomas de lesin al hgado, como orina amarilla oscura o marrn; sensacin general de estar enfermo o sntomas gripales; heces claras; prdida del apetito; nuseas; dolor en la regin abdominal superior derecha; cansancio o debilidad inusual; color amarillento de los ojos o la piel hinchazn de tobillos, pies, manos ritmo cardiaco inusualmente lento Efectos secundarios que generalmente no requieren atencin mdica (infrmelos a su mdico o a su profesional de la salud si persisten o si son molestos): diarrea cada del cabello prdida del apetito dolores musculares o articulares nuseas, vmito dolor, enrojecimiento o irritacin en el lugar de la inyeccin cansancio Puede ser que esta lista no menciona todos los posibles efectos secundarios. Comunquese a su mdico por asesoramiento mdico sobre los efectos secundarios. Usted puede informar los efectos secundarios a la FDA por telfono al 1-800-FDA-1088. Dnde debo guardar mi medicina? Este medicamento se administra en hospitales o clnicas y no necesitar guardarlo en su domicilio. ATENCIN: Este folleto es un resumen. Puede ser que no cubra toda la posible informacin. Si usted tiene preguntas acerca de esta medicina, consulte con su mdico, su farmacutico o su profesional de la salud.  2020 Elsevier/Gold Standard (2016-11-06 00:00:00)  Carboplatin injection Qu es este medicamento? El CARBOPLATINO es un agente quimioteraputico. Este medicamento acta sobre las clulas que se dividen rpidamente, como las clulas cancergenas, y finalmente provoca la muerte de estas clulas. Se utiliza en el tratamiento del cncer de ovario y muchos otros tipos de cncer. Este medicamento puede ser utilizado para otros usos; si tiene alguna pregunta consulte con su proveedor de atencin mdica o con su farmacutico. MARCAS COMUNES: Paraplatin Qu le debo informar a mi profesional de  la salud antes de tomar este medicamento? Necesita saber si usted presenta alguno de los siguientes problemas o situaciones:  trastornos sanguneos  problemas auditivos  enfermedad renal  radioterapia reciente o continuada  una reaccin alrgica o inusual al carboplatino, al cisplatino, a otros agentes quimioteraputicos, a otros medicamentos, alimentos, colorantes o conservantes  si est embarazada o buscando quedar embarazada  si est amamantando a un beb Cmo debo utilizar este medicamento? Este medicamento se administra normalmente mediante infusin por va intravenosa. Lo administra un profesional de la salud calificado en un hospital o en un entorno clnico. Hable con su pediatra para informarse acerca del uso de este medicamento en nios. Puede requerir atencin especial. Sobredosis: Pngase en contacto inmediatamente con un centro toxicolgico o una sala de urgencia si usted cree que haya tomado demasiado medicamento. ATENCIN: Este medicamento es solo para usted. No comparta este medicamento con nadie. Qu sucede si me olvido de una dosis? Es importante no olvidar ninguna dosis. Informe a su mdico o a su profesional de la salud si no puede asistir a una cita. Qu puede interactuar con este medicamento?  medicamentos para convulsiones  medicamentos para incrementar los conteos sanguneos, tales como filgrastim, pegfilgrastim, sargramostim  ciertos antibiticos, tales como amicacina, gentamicina, neomicina, estreptomicina, tobramicina  vacunas Consulte a su mdico o a su profesional de la salud antes de tomar cualquiera de los siguientes medicamentos:  acetaminofeno  aspirina  ibuprofeno  quetoprofeno  naproxeno Puede ser que esta lista no menciona todas las posibles interacciones. Informe a su profesional de la salud de todos los productos a base de hierbas, medicamentos de venta libre o suplementos nutritivos que est tomando. Si usted fuma, consume bebidas  alcohlicas o si utiliza drogas ilegales, indqueselo tambin a su profesional de la salud. Algunas sustancias pueden interactuar con su medicamento. A qu debo   estar atento al usar este medicamento? Se supervisar su estado de salud atentamente mientras reciba este medicamento. Tendr que hacerse anlisis de sangre peridicos mientras est tomando este medicamento. Este medicamento puede hacerle sentir un malestar general. Esto es normal ya que la quimioterapia afecta tanto a las clulas sanas como a las clulas cancerosas. Si presenta alguno de los efectos secundarios, infrmelos. Sin embargo, contine con el tratamiento aun si se siente enfermo, a menos que su mdico le indique que lo suspenda. En algunos casos, podr recibir medicamentos adicionales para ayudarle con los efectos secundarios. Siga las instrucciones para usarlos. Consulte a su mdico o a su profesional de la salud por asesoramiento si tiene fiebre, escalofros, dolor de garganta o cualquier otro sntoma de resfro o gripe. No se trate usted mismo. Este medicamento puede reducir la capacidad del cuerpo para combatir infecciones. Trate de no acercarse a personas que estn enfermas. Este medicamento puede aumentar el riesgo de magulladuras o sangrado. Consulte a su mdico o a su profesional de la salud si observa sangrados inusuales. Proceda con cuidado al cepillar sus dientes, usar hilo dental o utilizar palillos para los dientes, ya que puede contraer una infeccin o sangrar con mayor facilidad. Si se somete a algn tratamiento dental, informe a su dentista que est usando este medicamento. Evite tomar productos que contienen aspirina, acetaminofeno, ibuprofeno, naproxeno o quetoprofeno a menos que as lo indique su mdico. Estos productos pueden disimular la fiebre. No se debe quedar embarazada mientras recibe este medicamento. Las mujeres deben informar a su mdico si estn buscando quedar embarazadas o si creen que estn embarazadas.  Existe la posibilidad de efectos secundarios graves a un beb sin nacer. Para ms informacin hable con su profesional de la salud o su farmacutico. No debe amamantar a un beb mientras est usando este medicamento. Qu efectos secundarios puedo tener al utilizar este medicamento? Efectos secundarios que debe informar a su mdico o a su profesional de la salud tan pronto como sea posible:  reacciones alrgicas como erupcin cutnea, picazn o urticarias, hinchazn de la cara, labios o lengua  signos de infeccin - fiebre o escalofros, tos, dolor de garganta, dolor o dificultad para orinar  signos de reduccin de plaquetas o sangrado - magulladuras, puntos rojos en la piel, heces de color oscuro o con aspecto alquitranado, sangrando por la nariz  signos de reduccin de glbulos rojos - cansancio o debilidad inusual, desmayos, sensacin de mareo  problemas respiratorios  cambios de audicin  cambios en la visin  dolor en el pecho  alta presin sangunea  conteos sanguneos bajos - Este medicamento puede reducir la cantidad de glbulos blancos, glbulos rojos y plaquetas. Su riesgo de infeccin y sangrado puede ser mayor.  nuseas, vmito  dolor, enrojecimiento, hinchazn o irritacin en el lugar de la inyeccin  dolor, hormigueo, entumecimiento de manos o pies  problemas de coordinacin, del habla, al caminar  dificultad para orinar o cambios en el volumen de orina Efectos secundarios que, por lo general, no requieren atencin mdica (debe informarlos a su mdico o a su profesional de la salud si persisten o si son molestos):  cada del cabello  prdida del apetito  sabor metlico o cambios en el sentido del gusto Puede ser que esta lista no menciona todos los posibles efectos secundarios. Comunquese a su mdico por asesoramiento mdico sobre los efectos secundarios. Usted puede informar los efectos secundarios a la FDA por telfono al 1-800-FDA-1088. Dnde debo guardar  mi medicina? Este medicamento se administra en   hospitales o clnicas y no necesitar guardarlo en su domicilio. ATENCIN: Este folleto es un resumen. Puede ser que no cubra toda la posible informacin. Si usted tiene preguntas acerca de esta medicina, consulte con su mdico, su farmacutico o su profesional de la salud.  2020 Elsevier/Gold Standard (2014-03-07 00:00:00)    

## 2019-08-30 ENCOUNTER — Other Ambulatory Visit: Payer: Self-pay

## 2019-08-30 ENCOUNTER — Ambulatory Visit
Admission: RE | Admit: 2019-08-30 | Discharge: 2019-08-30 | Disposition: A | Discharge status: Home / Self Care | Payer: Self-pay | Source: Ambulatory Visit | Attending: Radiation Oncology | Admitting: Radiation Oncology

## 2019-08-30 ENCOUNTER — Telehealth: Payer: Self-pay | Admitting: Nurse Practitioner

## 2019-08-30 NOTE — Telephone Encounter (Signed)
No 8/2 los

## 2019-08-31 ENCOUNTER — Ambulatory Visit
Admission: RE | Admit: 2019-08-31 | Discharge: 2019-08-31 | Disposition: A | Discharge status: Home / Self Care | Payer: Self-pay | Source: Ambulatory Visit | Attending: Radiation Oncology | Admitting: Radiation Oncology

## 2019-08-31 ENCOUNTER — Ambulatory Visit: Payer: Self-pay | Attending: Physician Assistant | Admitting: Physician Assistant

## 2019-08-31 ENCOUNTER — Other Ambulatory Visit: Payer: Self-pay

## 2019-08-31 NOTE — Progress Notes (Signed)
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 Mid to lower esophageal mass along an 8 cm segment, large endoluminal component, strongly favoring esophageal malignancy. Borderline enlarged AP window lymph node. No findings of distant metastatic disease. 2. Trace bilateral pleural effusions. 3. Diffuse hepatic steatosis. 4. Cholelithiasis. 5. Fatty spermatic cords likely due to small bilateral indirect inguinal hernias.   08/11/2019 Procedure   EGD by Dr Carlean Purl  IMPRESSION - Partially obstructing, likely malignant esophageal tumor was found in the upper third of the esophagus and in the middle third of the esophagus. Biopsied. Very large and long - difficult but able to advance scope by this lesion - await CT for better length estimate - Normal stomach. - Normal examined duodenum.   08/11/2019 Initial Biopsy   FINAL MICROSCOPIC DIAGNOSIS:   A. ESOPHAGUS, UPPER, BIOPSY:  - Squamous cell carcinoma.    08/18/2019 Cancer Staging   Staging form: Esophagus - Squamous Cell Carcinoma, AJCC 8th Edition - Clinical: Stage Unknown (cTX, cN1, cM0) - Signed by [name], MD on 08/18/2019   08/29/2019 -   Chemotherapy   Concurrent chemoradiation with weekly CT for 6 weeks starting 08/29/19    08/29/2019 -  Radiation Therapy   Concurrent chemoradiation with Dr Lisbeth Renshaw starting 08/29/19      CURRENT THERAPY:  Concurrent chemoradiation with weekly CT for 6 weeks starting 08/29/19.   INTERVAL HISTORY:  Jimmy Hawkins is here for a follow up and treatment. He presents to the clinic with family member and Jimmy Hawkins. He notes he is doing well. He has tolerated last cycle chemo well overall.  He is currently on soft food diet. He eats more often but smaller meals. He denies nausea and notes adequate BM. He has darker stool, but he is on oral iron. He notes mid upper abdomen pain and pain form right jaw radiating to upper head. This jaw and head pain started 2 weeks ago. He notes pain with masturbation and b/l gum intermittently. He notes occasional chills at home, but no fever at home. He notes he is able to remain active enough.  For pain he is on half tablet Percocet. He is currently not on Liquid hydrocodone, but this worked better. He notes he has issues getting his pain medications when refilled.    REVIEW OF SYSTEMS:   Constitutional: Denies fevers, chills or abnormal weight loss Eyes: Denies blurriness of vision Ears, nose, mouth, throat, and face: Denies mucositis or sore throat (+) Soft food diet. (+) Intermittent lower gum pain and pain with chewing.  Respiratory: Denies cough, dyspnea or wheezes Cardiovascular: Denies palpitation, chest discomfort or lower extremity swelling Gastrointestinal:  Denies nausea, heartburn or change in bowel habits Skin: Denies abnormal  skin rashes MSK: (+) right jaw pain radiating to top of head  Lymphatics: Denies new lymphadenopathy or easy bruising Neurological:Denies numbness, tingling or new weaknesses Behavioral/Psych: Mood is stable, no new changes  All other systems were reviewed with the patient and are negative.  MEDICAL HISTORY:    Past Medical History:  Diagnosis Date  . GERD (gastroesophageal reflux disease)   . Hematemesis with nausea 08/10/2019    SURGICAL HISTORY: Past Surgical History:  Procedure Laterality Date  . BIOPSY  08/11/2019   Procedure: BIOPSY;  Surgeon: [name], MD;  Location: [facility] ENDOSCOPY;  Service: Endoscopy;;  . ESOPHAGOGASTRODUODENOSCOPY  08/11/2019  . ESOPHAGOGASTRODUODENOSCOPY (EGD) WITH PROPOFOL N/A 08/11/2019   Procedure: ESOPHAGOGASTRODUODENOSCOPY (EGD) WITH PROPOFOL;  Surgeon: [name], MD;  Location: [place];  Service: Endoscopy;  Laterality: N/A;    I have reviewed the social history and family history with the patient and they are unchanged from previous note.  ALLERGIES:  has No Known Allergies.  MEDICATIONS:  Current Outpatient Medications  Medication Sig Dispense Refill  . amoxicillin-clavulanate (AUGMENTIN) 875-125 MG tablet Take 1 tablet by mouth 2 (two) times daily. 14 tablet 0  . Ferrous Sulfate (IRON) 325 (65 Fe) MG TABS Take 1 tablet (325 mg total) by mouth 2 (two) times daily. 60 tablet 0  . HYDROcodone-Acetaminophen 10-325 MG/15ML SOLN Take 15 mLs by mouth every 8 (eight) hours as needed. 473 mL 0  . omeprazole (PRILOSEC) 40 MG capsule Take 1 capsule (40 mg total) by mouth daily. 30 capsule 0  . ondansetron (ZOFRAN) 8 MG tablet Take 1 tablet (8 mg total) by mouth 2 (two) times daily as needed for refractory nausea / vomiting. Start on day 3 after chemo. 30 tablet 1  . prochlorperazine (COMPAZINE) 10 MG tablet Take 1 tablet (10 mg total) by mouth every 6 (six) hours as needed (Nausea or vomiting). 30 tablet 1   No current facility-administered medications for this visit.   Facility-Administered Medications Ordered in Other Visits  Medication Dose Route Frequency Provider Last Rate Last Admin  . CARBOplatin (PARAPLATIN) 300 mg in sodium chloride 0.9 % 250 mL chemo infusion  300 mg Intravenous Once [name], MD      . dexamethasone (DECADRON) 10 mg in  sodium chloride 0.9 % 50 mL IVPB  10 mg Intravenous Once [name], MD 204 mL/hr at 09/05/19 1344 10 mg at 09/05/19 1344  . PACLitaxel (TAXOL) 96 mg in sodium chloride 0.9 % 250 mL chemo infusion (</= 5m/m2)  50 mg/m2 (Treatment Plan Recorded) Intravenous Once FTruitt Merle MD        PHYSICAL EXAMINATION: ECOG PERFORMANCE STATUS: 1 - Symptomatic but completely ambulatory  Vitals:   09/05/19 1151 09/05/19 1152  BP: 115/78   Pulse: 99   Resp: 17   Temp: (!) 100.5 F (38.1 C) 99.7 F (37.6 C)  SpO2: 100%    Filed Weights   09/05/19 1151  Weight: 164 lb 11.2 oz (74.7 kg)    GENERAL:alert, no distress and comfortable SKIN: skin color, texture, turgor are normal, no rashes or significant lesions EYES: normal, Conjunctiva are pink and non-injected, sclera clear OROPHARYNX:no exudate, no erythema and lips, buccal mucosa, and tongue normal (+) Right ear appears clogged, no discharge NECK: supple, thyroid normal size, non-tender, without nodularity LYMPH:  no palpable lymphadenopathy in the axillary (+) Palpable right submandibular cervical LN LUNGS: clear to auscultation and percussion with normal breathing effort HEART: regular rate & rhythm and no murmurs and no lower extremity  edema ABDOMEN:abdomen soft, non-tender and normal bowel sounds Musculoskeletal:no cyanosis of digits and no clubbing  NEURO: alert & oriented x 3 with fluent speech, no focal motor/sensory deficits  LABORATORY DATA:  I have reviewed the data as listed CBC Latest Ref Rng & Units 09/05/2019 08/29/2019 08/22/2019  WBC 4.0 - 10.5 K/uL 5.0 7.4 8.0  Hemoglobin 13.0 - 17.0 g/dL 10.2(L) 10.4(L) 10.2(L)  Hematocrit 39 - 52 % 31.7(L) 33.1(L) 32.8(L)  Platelets 150 - 400 K/uL 271 333 358     CMP Latest Ref Rng & Units 09/05/2019 08/29/2019 08/22/2019  Glucose 70 - 99 mg/dL 92 91 102(H)  BUN 6 - 20 mg/dL '7 8 7  ' Creatinine 0.61 - 1.24 mg/dL 0.64 0.70 0.71  Sodium 135 - 145 mmol/L 134(L) 136 137  Potassium 3.5 - 5.1 mmol/L  3.9 3.7 3.6  Chloride 98 - 111 mmol/L 100 102 102  CO2 22 - 32 mmol/L '26 25 24  ' Calcium 8.9 - 10.3 mg/dL 9.2 9.6 9.6  Total Protein 6.5 - 8.1 g/dL 7.4 8.0 8.3(H)  Total Bilirubin 0.3 - 1.2 mg/dL <0.2(L) 0.3 0.3  Alkaline Phos 38 - 126 U/L 77 74 68  AST 15 - 41 U/L 15 14(L) 22  ALT 0 - 44 U/L '16 12 24      ' RADIOGRAPHIC STUDIES: I have personally reviewed the radiological images as listed and agreed with the findings in the report. No results found.   ASSESSMENT & PLAN:  Jimmy Hawkins is a 44 y.o. male with    1. Middle Esophageal Squamous Cell Carcinoma, cTxN1M0 -He was diagnosed in 07/2019 with presented with  Squamous Cell Carcinoma as seen on EGD. 08/11/19 CT CAP found him to have mid esophageal mass and enlarged AP window node, no distant metastasis.  -I previously discussed treatment options for locally advanced esophageal cancer, including neoadjuvant concurrent chemoradiation, followed by esophagectomy, and adjuvant immunotherapy.   -I started him on concurrent chemoRT with  weekly IV Carboplatin and Taxol on days of Radiation for 6 weeks beginning 08/29/19.  -S/p week 1 he tolerated treatment well with no major side effects.  -Labs reviewed and adequate to proceed with week 2 CT. Continue Radiation.  -F/u next week   2.Moderate anemia secondary to GI bleeding and probable iron deficiency -He has had recurrent Nausea and vomiting with hematemesis since 01/2019.  -He required blood transfusion on 08/15/19 and IV Feraheme on 08/12/19 and 08/22/19.  -Given he does not have insurance, will start paperwork for free Feraheme drug coverage.   -Continue oral iron.   3.Heavy alcohol user -We discussed alcohol cessation, he agrees to try   4. Dysphagia, Nutrition, Weight Loss, Abdominal pain  -He reports progressive dysphagia with solid food for the past months, he is currently on soft diet. He developedintermittent upper abdominal pain for the past few weeks.  -He is  still tolerating oral soft diet well, I do not think he needs a feeding tube at this point. Will monitor during chemoRT.  -His upper abdominal pain is currently controlled on Hycet q6hours, but had issues refilling. He is currently on Percocet which he has been taking 1 tab daily. He notes Hycet worked better for him. He can use liquid Tylenol with Percocet as needed.  -Given this is a controlled substance I will refill with Hycet next time and to pick up at [facility] for now on. -I discussed pain medication can lead to constipation. I suggest he use Miralax as needed.  -Weight is stable.  He continues to f/u with Dietician.   5. Social and Financial Support  -Patient does not have insurance. He is also undocumented. -He has met with financial advocate Shauna and SW and he qualifies for grant assistance with medications. He notes having issues getting his medications, specifically his pain medications. I discussed for grant use he needs to use [facility].  6. Right Jaw pain and low grade fever  -He notes for the past 2 weeks (1 week before start of chemo) he is having right jaw pain that radiates to top of head along with lower gum pain and pain with chewing. He has not been seen by dentist recently.  -On exam today he has palpable right submandibular LN along with tenderness of right TMJ. H has clogged ear. Given exam and Temp of 34F today (09/05/19), I will call in Augmentin for probable infection. He is agreeable.    PLAN:  -I called in Augmentin today  -Labs reviewed and adequate to proceed with week 2 CT today  -Continue Radiation  -Lab, F/u and Chemo CT weekly   No problem-specific Assessment & Plan notes found for this encounter.   No orders of the defined types were placed in this encounter.  All questions were answered. The patient knows to call the clinic with any problems, questions or concerns. No barriers to learning was detected. The total time spent in the appointment was  30 minutes.     [name], MD 09/05/2019   I, Joslyn Devon, am acting as scribe for [name], MD.   I have reviewed the above documentation for accuracy and completeness, and I agree with the above.

## 2019-09-01 ENCOUNTER — Ambulatory Visit
Admission: RE | Admit: 2019-09-01 | Discharge: 2019-09-01 | Disposition: A | Discharge status: Home / Self Care | Payer: Self-pay | Source: Ambulatory Visit | Attending: Radiation Oncology | Admitting: Radiation Oncology

## 2019-09-01 ENCOUNTER — Other Ambulatory Visit: Payer: Self-pay

## 2019-09-02 ENCOUNTER — Ambulatory Visit
Admission: RE | Admit: 2019-09-02 | Discharge: 2019-09-02 | Disposition: A | Discharge status: Home / Self Care | Payer: Self-pay | Source: Ambulatory Visit | Attending: Radiation Oncology | Admitting: Radiation Oncology

## 2019-09-02 ENCOUNTER — Other Ambulatory Visit: Payer: Self-pay

## 2019-09-02 DIAGNOSIS — C158 Malignant neoplasm of overlapping sites of esophagus: Secondary | ICD-10-CM

## 2019-09-02 MED ORDER — SONAFINE EX EMUL
1.0000 "application " | Freq: Once | CUTANEOUS | Status: AC
  Administered 2019-09-02: 1 via TOPICAL

## 2019-09-02 MED FILL — Dexamethasone Sodium Phosphate Inj 100 MG/10ML: INTRAMUSCULAR | Qty: 2 | Status: AC

## 2019-09-02 NOTE — Progress Notes (Signed)
Pt here for patient teaching.  Pt given Radiation and You booklet, skin care instructions and Sonafine.  Reviewed areas of pertinence such as fatigue, hair loss, skin changes and throat changes . Pt able to give teach back of to pat skin and use unscented/gentle soap,apply Sonafine bid and avoid applying anything to skin within 4 hours of treatment. Pt verbalizes understanding of information given and will contact nursing with any questions or concerns.  Interpreter present for education.     Gloriajean Dell. Leonie Green, BSN

## 2019-09-05 ENCOUNTER — Inpatient Hospital Stay: Payer: Self-pay

## 2019-09-05 ENCOUNTER — Ambulatory Visit: Payer: Self-pay

## 2019-09-05 ENCOUNTER — Encounter: Payer: Self-pay | Admitting: Hematology

## 2019-09-05 ENCOUNTER — Inpatient Hospital Stay (HOSPITAL_BASED_OUTPATIENT_CLINIC_OR_DEPARTMENT_OTHER): Payer: Self-pay | Admitting: Hematology

## 2019-09-05 ENCOUNTER — Other Ambulatory Visit: Payer: Self-pay

## 2019-09-05 ENCOUNTER — Encounter: Payer: Self-pay | Admitting: Pharmacy Technician

## 2019-09-05 ENCOUNTER — Ambulatory Visit
Admission: RE | Admit: 2019-09-05 | Discharge: 2019-09-05 | Disposition: A | Discharge status: Home / Self Care | Payer: Self-pay | Source: Ambulatory Visit | Attending: Radiation Oncology | Admitting: Radiation Oncology

## 2019-09-05 VITALS — BP 115/78 | HR 99 | Temp 99.7°F | Resp 17 | Ht 66.0 in | Wt 164.7 lb

## 2019-09-05 VITALS — Temp 99.8°F

## 2019-09-05 DIAGNOSIS — C154 Malignant neoplasm of middle third of esophagus: Secondary | ICD-10-CM

## 2019-09-05 LAB — CMP (CANCER CENTER ONLY)
ALT: 16 U/L (ref 0–44)
AST: 15 U/L (ref 15–41)
Albumin: 2.9 g/dL — ABNORMAL LOW (ref 3.5–5.0)
Alkaline Phosphatase: 77 U/L (ref 38–126)
Anion gap: 8 (ref 5–15)
BUN: 7 mg/dL (ref 6–20)
CO2: 26 mmol/L (ref 22–32)
Calcium: 9.2 mg/dL (ref 8.9–10.3)
Chloride: 100 mmol/L (ref 98–111)
Creatinine: 0.64 mg/dL (ref 0.61–1.24)
GFR, Est AFR Am: >60 mL/min (ref >60)
GFR, Estimated: >60 mL/min (ref >60)
Glucose, Bld: 92 mg/dL (ref 70–99)
Potassium: 3.9 mmol/L (ref 3.5–5.1)
Sodium: 134 mmol/L — ABNORMAL LOW (ref 135–145)
Total Bilirubin: <0.2 mg/dL — ABNORMAL LOW (ref 0.3–1.2)
Total Protein: 7.4 g/dL (ref 6.5–8.1)

## 2019-09-05 LAB — CBC WITH DIFFERENTIAL (CANCER CENTER ONLY)
Abs Immature Granulocytes: 0.06 10*3/uL (ref 0.00–0.07)
Basophils Absolute: 0 10*3/uL (ref 0.0–0.1)
Basophils Relative: 0 %
Eosinophils Absolute: 0.1 10*3/uL (ref 0.0–0.5)
Eosinophils Relative: 2 %
HCT: 31.7 % — ABNORMAL LOW (ref 39.0–52.0)
Hemoglobin: 10.2 g/dL — ABNORMAL LOW (ref 13.0–17.0)
Immature Granulocytes: 1 %
Lymphocytes Relative: 10 %
Lymphs Abs: 0.5 10*3/uL — ABNORMAL LOW (ref 0.7–4.0)
MCH: 27.1 pg (ref 26.0–34.0)
MCHC: 32.2 g/dL (ref 30.0–36.0)
MCV: 84.1 fL (ref 80.0–100.0)
Monocytes Absolute: 0.6 10*3/uL (ref 0.1–1.0)
Monocytes Relative: 13 %
Neutro Abs: 3.7 10*3/uL (ref 1.7–7.7)
Neutrophils Relative %: 74 %
Platelet Count: 271 10*3/uL (ref 150–400)
RBC: 3.77 MIL/uL — ABNORMAL LOW (ref 4.22–5.81)
RDW: 16.6 % — ABNORMAL HIGH (ref 11.5–15.5)
WBC Count: 5 10*3/uL (ref 4.0–10.5)
nRBC: 0 % (ref 0.0–0.2)

## 2019-09-05 MED ORDER — SODIUM CHLORIDE 0.9 % IV SOLN
Freq: Once | INTRAVENOUS | Status: AC
  Filled 2019-09-05: qty 250

## 2019-09-05 MED ORDER — PALONOSETRON HCL INJECTION 0.25 MG/5ML
INTRAVENOUS | Status: AC
  Filled 2019-09-05: qty 5

## 2019-09-05 MED ORDER — FAMOTIDINE IN NACL 20-0.9 MG/50ML-% IV SOLN
20.0000 mg | Freq: Once | INTRAVENOUS | Status: AC
  Administered 2019-09-05: 20 mg via INTRAVENOUS

## 2019-09-05 MED ORDER — SODIUM CHLORIDE 0.9 % IV SOLN
10.0000 mg | Freq: Once | INTRAVENOUS | Status: AC
  Administered 2019-09-05: 10 mg via INTRAVENOUS
  Filled 2019-09-05: qty 10

## 2019-09-05 MED ORDER — AMOXICILLIN-POT CLAVULANATE 875-125 MG PO TABS
1.0000 | ORAL_TABLET | Freq: Two times a day (BID) | ORAL | 0 refills | Status: DC
Start: 2019-09-05 — End: 2019-09-12

## 2019-09-05 MED ORDER — DIPHENHYDRAMINE HCL 50 MG/ML IJ SOLN
50.0000 mg | Freq: Once | INTRAMUSCULAR | Status: AC
  Administered 2019-09-05: 50 mg via INTRAVENOUS

## 2019-09-05 MED ORDER — PALONOSETRON HCL INJECTION 0.25 MG/5ML
0.2500 mg | Freq: Once | INTRAVENOUS | Status: AC
  Administered 2019-09-05: 0.25 mg via INTRAVENOUS

## 2019-09-05 MED ORDER — SODIUM CHLORIDE 0.9 % IV SOLN
300.0000 mg | Freq: Once | INTRAVENOUS | Status: AC
  Administered 2019-09-05: 300 mg via INTRAVENOUS
  Filled 2019-09-05: qty 30

## 2019-09-05 MED ORDER — FAMOTIDINE IN NACL 20-0.9 MG/50ML-% IV SOLN
INTRAVENOUS | Status: AC
  Filled 2019-09-05: qty 50

## 2019-09-05 MED ORDER — DIPHENHYDRAMINE HCL 50 MG/ML IJ SOLN
INTRAMUSCULAR | Status: AC
  Filled 2019-09-05: qty 1

## 2019-09-05 MED ORDER — SODIUM CHLORIDE 0.9 % IV SOLN
50.0000 mg/m2 | Freq: Once | INTRAVENOUS | Status: AC
  Administered 2019-09-05: 96 mg via INTRAVENOUS
  Filled 2019-09-05: qty 16

## 2019-09-05 MED FILL — AMOX-CLAV 875-125 MG TABLET: 875-125 | 7 days supply | Qty: 14 | Fill #0

## 2019-09-05 NOTE — Progress Notes (Signed)
Patient has been approved for drug assistance by [employer] for Feraheme. The enrollment period is from 08/22/19-08/20/20 based on self pay. First DOS covered is 08/15/19.

## 2019-09-05 NOTE — Patient Instructions (Signed)
[facility] Discharge Instructions for Patients Receiving Chemotherapy  Today you received the following chemotherapy agents: paclitaxel and carboplatin.  To help prevent nausea and vomiting after your treatment, we encourage you to take your nausea medication as directed.   If you develop nausea and vomiting that is not controlled by your nausea medication, call the clinic.   BELOW ARE SYMPTOMS THAT SHOULD BE REPORTED IMMEDIATELY:  *FEVER GREATER THAN 100.5 F  *CHILLS WITH OR WITHOUT FEVER  NAUSEA AND VOMITING THAT IS NOT CONTROLLED WITH YOUR NAUSEA MEDICATION  *UNUSUAL SHORTNESS OF BREATH  *UNUSUAL BRUISING OR BLEEDING  TENDERNESS IN MOUTH AND THROAT WITH OR WITHOUT PRESENCE OF ULCERS  *URINARY PROBLEMS  *BOWEL PROBLEMS  UNUSUAL RASH Items with * indicate a potential emergency and should be followed up as soon as possible.  Feel free to call the clinic should you have any questions or concerns. The clinic phone number is [phone].  Please show the CHEMO ALERT CARD at check-in to the Emergency Department and triage nurse.   

## 2019-09-06 ENCOUNTER — Ambulatory Visit
Admission: RE | Admit: 2019-09-06 | Discharge: 2019-09-06 | Disposition: A | Discharge status: Home / Self Care | Payer: Self-pay | Source: Ambulatory Visit | Attending: Radiation Oncology | Admitting: Radiation Oncology

## 2019-09-06 ENCOUNTER — Other Ambulatory Visit: Payer: Self-pay

## 2019-09-07 ENCOUNTER — Telehealth: Payer: Self-pay | Admitting: Hematology

## 2019-09-07 ENCOUNTER — Ambulatory Visit
Admission: RE | Admit: 2019-09-07 | Discharge: 2019-09-07 | Disposition: A | Discharge status: Home / Self Care | Payer: Self-pay | Source: Ambulatory Visit | Attending: Radiation Oncology | Admitting: Radiation Oncology

## 2019-09-07 ENCOUNTER — Other Ambulatory Visit: Payer: Self-pay

## 2019-09-07 NOTE — Telephone Encounter (Signed)
No 8/9 los 

## 2019-09-08 ENCOUNTER — Ambulatory Visit
Admission: RE | Admit: 2019-09-08 | Discharge: 2019-09-08 | Disposition: A | Discharge status: Home / Self Care | Payer: Self-pay | Source: Ambulatory Visit | Attending: Radiation Oncology | Admitting: Radiation Oncology

## 2019-09-08 ENCOUNTER — Other Ambulatory Visit: Payer: Self-pay

## 2019-09-09 ENCOUNTER — Ambulatory Visit
Admission: RE | Admit: 2019-09-09 | Discharge: 2019-09-09 | Disposition: A | Discharge status: Home / Self Care | Payer: Self-pay | Source: Ambulatory Visit | Attending: Radiation Oncology | Admitting: Radiation Oncology

## 2019-09-09 NOTE — Progress Notes (Signed)
[place]   Telephone:(336) [phone] Fax:(336) [phone]   Clinic Follow up Note   Patient Care Team: [name], MD as PCP - General (Hematology) Jonnie Finner, RN as Oncology Nurse Navigator [name], MD as Consulting Physician (Oncology)  Date of Service:  09/12/2019  CHIEF COMPLAINT: F/u of [name]  SUMMARY OF ONCOLOGIC HISTORY: Oncology History Overview Note  Cancer Staging Malignant neoplasm of middle third of esophagus ([place]) Staging form: Esophagus - Squamous Cell Carcinoma, AJCC 8th Edition - Clinical: Stage Unknown (cTX, cN1, cM0) - Signed by [name], MD on 08/18/2019    Malignant neoplasm of middle third of esophagus [facility])   Initial Diagnosis   Malignant neoplasm of middle third of esophagus ([place])   08/11/2019 Imaging   CT CAP W contrast 08/11/19 IMPRESSION: 1. Mid to lower esophageal mass along an 8 cm segment, large endoluminal component, strongly favoring esophageal malignancy. Borderline enlarged AP window lymph node. No findings of distant metastatic disease. 2. Trace bilateral pleural effusions. 3. Diffuse hepatic steatosis. 4. Cholelithiasis. 5. Fatty spermatic cords likely due to small bilateral indirect inguinal hernias.   08/11/2019 Procedure   EGD by Dr Carlean Purl  IMPRESSION - Partially obstructing, likely malignant esophageal tumor was found in the upper third of the esophagus and in the middle third of the esophagus. Biopsied. Very large and long - difficult but able to advance scope by this lesion - await CT for better length estimate - Normal stomach. - Normal examined duodenum.   08/11/2019 Initial Biopsy   FINAL MICROSCOPIC DIAGNOSIS:   A. ESOPHAGUS, UPPER, BIOPSY:  - Squamous cell carcinoma.    08/18/2019 Cancer Staging   Staging form: Esophagus - Squamous Cell Carcinoma, AJCC 8th Edition - Clinical: Stage Unknown (cTX, cN1, cM0) - Signed by [name], MD on 08/18/2019   08/29/2019 -   Chemotherapy   Concurrent chemoradiation with weekly CT for 6 weeks starting 08/29/19    08/29/2019 -  Radiation Therapy   Concurrent chemoradiation with Dr Lisbeth Renshaw starting 08/29/19      CURRENT THERAPY:  Concurrent chemoradiation with weekly CT for 6 weeks starting 08/29/19.  INTERVAL HISTORY:  Jimmy Hawkins is here for a follow up and treatment. He presents to the clinic with his Spanish Interpretor and his sister.  He is tolerating chemo and radiation well overall.  He has some mild throat discomfort, tolerance of diet, and able to swallow medicines well.  His epigastric pain is controlled by oxycodone half tablet 1-2 times a day.  Energy level is decent, he is currently out of work, spends most of time at home, watching TV, feels bored. Weight is stable.  His right jaw pain has resolved, he is afebrile.  Review of system otherwise negative.  MEDICAL HISTORY:  Past Medical History:  Diagnosis Date   GERD (gastroesophageal reflux disease)    Hematemesis with nausea 08/10/2019    SURGICAL HISTORY: Past Surgical History:  Procedure Laterality Date   BIOPSY  08/11/2019   Procedure: BIOPSY;  Surgeon: [name], MD;  Location: [facility] ENDOSCOPY;  Service: Endoscopy;;   ESOPHAGOGASTRODUODENOSCOPY  08/11/2019   ESOPHAGOGASTRODUODENOSCOPY (EGD) WITH PROPOFOL N/A 08/11/2019   Procedure: ESOPHAGOGASTRODUODENOSCOPY (EGD) WITH PROPOFOL;  Surgeon: [name], MD;  Location: [place];  Service: Endoscopy;  Laterality: N/A;    I have reviewed the social history and family history with the patient and they are unchanged from previous note.  ALLERGIES:  has No Known Allergies.  MEDICATIONS:  Current Outpatient  Medication Sig Dispense Refill  °• Ferrous Sulfate (IRON) 325 (65 Fe) MG TABS Take 1 tablet (325 mg total) by mouth 2 (two) times daily. 30 tablet 1  °• HYDROcodone-Acetaminophen 10-325 MG/15ML SOLN Take 15 mLs by mouth every 8 (eight) hours as needed. 473  mL 0  °• omeprazole (PRILOSEC) 40 MG capsule Take 1 capsule (40 mg total) by mouth daily. 30 capsule 2  °• ondansetron (ZOFRAN) 8 MG tablet Take 1 tablet (8 mg total) by mouth 2 (two) times daily as needed for refractory nausea / vomiting. Start on day 3 after chemo. 30 tablet 1  °• prochlorperazine (COMPAZINE) 10 MG tablet Take 1 tablet (10 mg total) by mouth every 6 (six) hours as needed (Nausea or vomiting). 30 tablet 1  ° °No current facility-administered medications for this visit.  ° ° °PHYSICAL EXAMINATION: °ECOG PERFORMANCE STATUS: 1 - Symptomatic but completely ambulatory ° °Vitals:  ° 09/12/19 0757  °BP: 119/80  °Pulse: 95  °Resp: 18  °Temp: 98.6 °F (37 °C)  °SpO2: 100%  ° °Filed Weights  ° 09/12/19 0757  °Weight: 166 lb 1.6 oz (75.3 kg)  ° ° °GENERAL:alert, no distress and comfortable °SKIN: skin color, texture, turgor are normal, no rashes or significant lesions °EYES: normal, Conjunctiva are pink and non-injected, sclera clear °NECK: supple, thyroid normal size, non-tender, without nodularity °LYMPH:  no palpable lymphadenopathy in the cervical, axillary  °LUNGS: clear to auscultation and percussion with normal breathing effort °HEART: regular rate & rhythm and no murmurs and no lower extremity edema °ABDOMEN:abdomen soft, non-tender and normal bowel sounds °Musculoskeletal:no cyanosis of digits and no clubbing  °NEURO: alert & oriented x 3 with fluent speech, no focal motor/sensory deficits ° °LABORATORY DATA:  °I have reviewed the data as listed °CBC Latest Ref Rng & Units 09/12/2019 09/05/2019 08/29/2019  °WBC 4.0 - 10.5 K/uL 2.2(L) 5.0 7.4  °Hemoglobin 13.0 - 17.0 g/dL 10.8(L) 10.2(L) 10.4(L)  °Hematocrit 39 - 52 % 33.7(L) 31.7(L) 33.1(L)  °Platelets 150 - 400 K/uL 224 271 333  ° ° ° °CMP Latest Ref Rng & Units 09/05/2019 08/29/2019 08/22/2019  °Glucose 70 - 99 mg/dL 92 91 102(H)  °BUN 6 - 20 mg/dL 7 8 7  °Creatinine 0.61 - 1.24 mg/dL 0.64 0.70 0.71  °Sodium 135 - 145 mmol/L 134(L) 136 137  °Potassium 3.5 -  5.1 mmol/L 3.9 3.7 3.6  °Chloride 98 - 111 mmol/L 100 102 102  °CO2 22 - 32 mmol/L 26 25 24  °Calcium 8.9 - 10.3 mg/dL 9.2 9.6 9.6  °Total Protein 6.5 - 8.1 g/dL 7.4 8.0 8.3(H)  °Total Bilirubin 0.3 - 1.2 mg/dL <0.2(L) 0.3 0.3  °Alkaline Phos 38 - 126 U/L 77 74 68  °AST 15 - 41 U/L 15 14(L) 22  °ALT 0 - 44 U/L 16 12 24  ° ° ° ° °RADIOGRAPHIC STUDIES: °I have personally reviewed the radiological images as listed and agreed with the findings in the report. °No results found.  ° °ASSESSMENT & PLAN:  °Jimmy Hawkins is a 44 y.o. male with  ° ° °1. Middle Esophageal Squamous Cell Carcinoma, cTxN1M0 °-He was diagnosed in 07/2019 with presented with  Squamous Cell Carcinoma as seen on EGD. 08/11/19 CT CAP found him to have mid esophageal mass and enlarged AP window node, no distant metastasis.  °-I previously discussed treatment options for locally advanced esophageal cancer, including neoadjuvant concurrent chemoradiation, followed by esophagectomy, and adjuvant immunotherapy.   °-I started him on concurrent chemoRT with  weekly IV Carboplatin and Taxol on days   of Radiation for 6 weeks beginning 08/29/19.  °-He is tolerating chemoradiation well, with mild throat discomfort and controlled epigastric pain.  Weight is stable.  Lab reviewed, he is mildly neutropenic, adequate for treatment, I will decrease carboplatin dose to AUC 1.5 °-Continue weekly chemo °-Follow-up next week ° °  °2.  Moderate anemia secondary to GI bleeding and probable iron deficiency °-He has had recurrent Nausea and vomiting with hematemesis since 01/2019.  °-He required blood transfusion on 08/15/19 and IV Feraheme on 08/12/19 and 08/22/19.  °-Continue oral iron.  °  °3.  Heavy alcohol user °-He has stopped alcohol drinking since cancer diagnosis.  Per his sister, he still has craving for alcohol, I will refer him to social worker. °  °4. Dysphagia, Nutrition, Weight Loss, Abdominal pain  °-He initially reported progressive dysphagia with solid  food for several months, he is currently on soft diet. He developed intermittent upper abdominal pain in 07/2019.  °-He is still tolerating oral soft diet well, I do not think he needs a feeding tube at this point. Will monitor during chemoRT.  °-His upper abdominal pain is currently controlled on Hycet q6hours, but had issues refilling. He is currently on Percocet which he has been taking 1 tab daily. He notes Hycet worked better for him. He can use liquid Tylenol with Percocet as needed.  °-Given this is a controlled substance I will refill with Hycet today at [facility]. °-I discussed pain medication can lead to constipation. I suggest he use Miralax as needed.  °-Weight is stable. He continues to f/u with Dietician.  °  °5. Social and Financial Support  °-Patient does not have insurance. He is also undocumented. °-He has met with financial advocate Shauna and SW and he qualifies for grant assistance with medications. He notes having issues getting his medications, specifically his pain medications. I previously  discussed for grant use he needs to use [facility]. °-I sent a message to Shauna today for assistance  °  °  °PLAN:  °-Labs reviewed, ANC 1.5, adequate for treatment, will proceed cycle 3 chemotherapy twice daily dose reduction to AUC 1.5 °-f/u next week °-I refilled Hycocet, iron pill, and omeprazole to [facility] today ° °No problem-specific Assessment & Plan notes found for this encounter. ° ° °No orders of the defined types were placed in this encounter. ° °All questions were answered. The patient knows to call the clinic with any problems, questions or concerns. No barriers to learning was detected. °The total time spent in the appointment was 30 minutes. ° °  °  , MD °09/12/2019  ° °I, Amoya Bennett, am acting as scribe for  , MD.  ° °I have reviewed the above documentation for accuracy and completeness, and I agree with the above. °  ° ° ° ° °

## 2019-09-12 ENCOUNTER — Inpatient Hospital Stay: Payer: Self-pay

## 2019-09-12 ENCOUNTER — Encounter: Payer: Self-pay | Admitting: Hematology

## 2019-09-12 ENCOUNTER — Ambulatory Visit
Admission: RE | Admit: 2019-09-12 | Discharge: 2019-09-12 | Disposition: A | Discharge status: Home / Self Care | Payer: Self-pay | Source: Ambulatory Visit | Attending: Radiation Oncology | Admitting: Radiation Oncology

## 2019-09-12 ENCOUNTER — Inpatient Hospital Stay (HOSPITAL_BASED_OUTPATIENT_CLINIC_OR_DEPARTMENT_OTHER): Payer: Self-pay | Admitting: Hematology

## 2019-09-12 ENCOUNTER — Other Ambulatory Visit: Payer: Self-pay | Admitting: Hematology

## 2019-09-12 ENCOUNTER — Inpatient Hospital Stay: Payer: Self-pay | Admitting: Nutrition

## 2019-09-12 ENCOUNTER — Other Ambulatory Visit: Payer: Self-pay

## 2019-09-12 VITALS — BP 119/80 | HR 95 | Temp 98.6°F | Resp 18 | Ht 66.0 in | Wt 166.1 lb

## 2019-09-12 DIAGNOSIS — C154 Malignant neoplasm of middle third of esophagus: Secondary | ICD-10-CM

## 2019-09-12 LAB — CBC WITH DIFFERENTIAL (CANCER CENTER ONLY)
Abs Immature Granulocytes: 0.07 10*3/uL (ref 0.00–0.07)
Basophils Absolute: 0 10*3/uL (ref 0.0–0.1)
Basophils Relative: 0 %
Eosinophils Absolute: 0.1 10*3/uL (ref 0.0–0.5)
Eosinophils Relative: 2 %
HCT: 33.7 % — ABNORMAL LOW (ref 39.0–52.0)
Hemoglobin: 10.8 g/dL — ABNORMAL LOW (ref 13.0–17.0)
Immature Granulocytes: 3 %
Lymphocytes Relative: 12 %
Lymphs Abs: 0.3 10*3/uL — ABNORMAL LOW (ref 0.7–4.0)
MCH: 27.6 pg (ref 26.0–34.0)
MCHC: 32 g/dL (ref 30.0–36.0)
MCV: 86.2 fL (ref 80.0–100.0)
Monocytes Absolute: 0.3 10*3/uL (ref 0.1–1.0)
Monocytes Relative: 15 %
Neutro Abs: 1.5 10*3/uL — ABNORMAL LOW (ref 1.7–7.7)
Neutrophils Relative %: 68 %
Platelet Count: 224 10*3/uL (ref 150–400)
RBC: 3.91 MIL/uL — ABNORMAL LOW (ref 4.22–5.81)
RDW: 17.1 % — ABNORMAL HIGH (ref 11.5–15.5)
WBC Count: 2.2 10*3/uL — ABNORMAL LOW (ref 4.0–10.5)
nRBC: 0 % (ref 0.0–0.2)

## 2019-09-12 LAB — CMP (CANCER CENTER ONLY)
ALT: 16 U/L (ref 0–44)
AST: 16 U/L (ref 15–41)
Albumin: 3.1 g/dL — ABNORMAL LOW (ref 3.5–5.0)
Alkaline Phosphatase: 76 U/L (ref 38–126)
Anion gap: 11 (ref 5–15)
BUN: 6 mg/dL (ref 6–20)
CO2: 22 mmol/L (ref 22–32)
Calcium: 9.4 mg/dL (ref 8.9–10.3)
Chloride: 106 mmol/L (ref 98–111)
Creatinine: 0.64 mg/dL (ref 0.61–1.24)
GFR, Est AFR Am: >60 mL/min (ref >60)
GFR, Estimated: >60 mL/min (ref >60)
Glucose, Bld: 108 mg/dL — ABNORMAL HIGH (ref 70–99)
Potassium: 3.8 mmol/L (ref 3.5–5.1)
Sodium: 139 mmol/L (ref 135–145)
Total Bilirubin: 0.3 mg/dL (ref 0.3–1.2)
Total Protein: 7.1 g/dL (ref 6.5–8.1)

## 2019-09-12 MED ORDER — PALONOSETRON HCL INJECTION 0.25 MG/5ML
INTRAVENOUS | Status: AC
  Filled 2019-09-12: qty 5

## 2019-09-12 MED ORDER — IRON 325 (65 FE) MG PO TABS: 1.0000 | ORAL_TABLET | Freq: Two times a day (BID) | ORAL | 1 refills | Status: DC

## 2019-09-12 MED ORDER — DIPHENHYDRAMINE HCL 50 MG/ML IJ SOLN
INTRAMUSCULAR | Status: AC
  Filled 2019-09-12: qty 1

## 2019-09-12 MED ORDER — HYDROCODONE-ACETAMINOPHEN 10-325 MG/15ML PO SOLN: 15.0000 mL | Freq: Three times a day (TID) | ORAL | 0 refills | Status: DC | PRN

## 2019-09-12 MED ORDER — SODIUM CHLORIDE 0.9 % IV SOLN
10.0000 mg | Freq: Once | INTRAVENOUS | Status: AC
  Administered 2019-09-12: 10 mg via INTRAVENOUS
  Filled 2019-09-12: qty 10

## 2019-09-12 MED ORDER — FAMOTIDINE IN NACL 20-0.9 MG/50ML-% IV SOLN
20.0000 mg | Freq: Once | INTRAVENOUS | Status: AC
  Administered 2019-09-12: 20 mg via INTRAVENOUS

## 2019-09-12 MED ORDER — SODIUM CHLORIDE 0.9 % IV SOLN
50.0000 mg/m2 | Freq: Once | INTRAVENOUS | Status: AC
  Administered 2019-09-12: 96 mg via INTRAVENOUS
  Filled 2019-09-12: qty 16

## 2019-09-12 MED ORDER — SODIUM CHLORIDE 0.9 % IV SOLN
Freq: Once | INTRAVENOUS | Status: AC
  Filled 2019-09-12: qty 250

## 2019-09-12 MED ORDER — HYDROCODONE-ACETAMINOPHEN 7.5-325 MG/15ML PO SOLN: 10.0000 mL | Freq: Four times a day (QID) | ORAL | 0 refills | Status: DC | PRN

## 2019-09-12 MED ORDER — PALONOSETRON HCL INJECTION 0.25 MG/5ML
0.2500 mg | Freq: Once | INTRAVENOUS | Status: AC
  Administered 2019-09-12: 0.25 mg via INTRAVENOUS

## 2019-09-12 MED ORDER — DIPHENHYDRAMINE HCL 50 MG/ML IJ SOLN
50.0000 mg | Freq: Once | INTRAMUSCULAR | Status: AC
  Administered 2019-09-12: 50 mg via INTRAVENOUS

## 2019-09-12 MED ORDER — FAMOTIDINE IN NACL 20-0.9 MG/50ML-% IV SOLN
INTRAVENOUS | Status: AC
  Filled 2019-09-12: qty 50

## 2019-09-12 MED ORDER — OMEPRAZOLE 40 MG PO CPDR: 40.0000 mg | DELAYED_RELEASE_CAPSULE | Freq: Every day | ORAL | 2 refills | Status: DC

## 2019-09-12 MED ORDER — SODIUM CHLORIDE 0.9 % IV SOLN
225.0000 mg | Freq: Once | INTRAVENOUS | Status: AC
  Administered 2019-09-12: 230 mg via INTRAVENOUS
  Filled 2019-09-12: qty 23

## 2019-09-12 MED FILL — HYDROCOD-APAP 7.5-325/15ML: 7.5-325 | 11 days supply | Qty: 473 | Fill #0

## 2019-09-12 MED FILL — FERROUS SULFATE 325 MG TAB: 325 (65 FE) | 15 days supply | Qty: 30 | Fill #0

## 2019-09-12 MED FILL — OMEPRAZOLE 40 MG CPDR: 40 | 30 days supply | Qty: 30 | Fill #0

## 2019-09-12 NOTE — Patient Instructions (Signed)
[facility] Discharge Instructions for Patients Receiving Chemotherapy  Today you received the following chemotherapy agents: paclitaxel and carboplatin.  To help prevent nausea and vomiting after your treatment, we encourage you to take your nausea medication as directed.   If you develop nausea and vomiting that is not controlled by your nausea medication, call the clinic.   BELOW ARE SYMPTOMS THAT SHOULD BE REPORTED IMMEDIATELY:  *FEVER GREATER THAN 100.5 F  *CHILLS WITH OR WITHOUT FEVER  NAUSEA AND VOMITING THAT IS NOT CONTROLLED WITH YOUR NAUSEA MEDICATION  *UNUSUAL SHORTNESS OF BREATH  *UNUSUAL BRUISING OR BLEEDING  TENDERNESS IN MOUTH AND THROAT WITH OR WITHOUT PRESENCE OF ULCERS  *URINARY PROBLEMS  *BOWEL PROBLEMS  UNUSUAL RASH Items with * indicate a potential emergency and should be followed up as soon as possible.  Feel free to call the clinic should you have any questions or concerns. The clinic phone number is [phone].  Please show the CHEMO ALERT CARD at check-in to the Emergency Department and triage nurse.   

## 2019-09-12 NOTE — Progress Notes (Signed)
Nutrition follow-up completed with patient and his interpreter during infusion for esophageal cancer. Weight was documented as 166.1 pounds August 16 which is stable overall. Noted labs of glucose 108. Patient denies nausea, vomiting, constipation, and diarrhea. He continues to eat normally.  Dietary recall reveals he eats a waffle and oatmeal or granola and yogurt at breakfast, chicken with rice at lunch, and beans and eggs for dinner.  He is drinking Ensure twice daily. He has no nutrition concerns.  Nutrition diagnosis: Increased nutrient needs continue.  Intervention: Enforced importance of continuing high-calorie, high-protein foods with oral nutrition supplements twice daily between meals. Encouraged weight maintenance. Provided coupons for Ensure.  Monitoring, evaluation, goals: Patient will tolerate adequate calories and protein for weight maintenance.  Next visit: Monday, August 23 during infusion.  **Disclaimer: This note was dictated with voice recognition software. Similar sounding words can inadvertently be transcribed and this note may contain transcription errors which may not have been corrected upon publication of note.**

## 2019-09-13 ENCOUNTER — Ambulatory Visit
Admission: RE | Admit: 2019-09-13 | Discharge: 2019-09-13 | Disposition: A | Discharge status: Home / Self Care | Payer: Self-pay | Source: Ambulatory Visit | Attending: Radiation Oncology | Admitting: Radiation Oncology

## 2019-09-13 ENCOUNTER — Other Ambulatory Visit: Payer: Self-pay

## 2019-09-14 ENCOUNTER — Other Ambulatory Visit: Payer: Self-pay

## 2019-09-14 ENCOUNTER — Telehealth: Payer: Self-pay | Admitting: Hematology

## 2019-09-14 ENCOUNTER — Ambulatory Visit
Admission: RE | Admit: 2019-09-14 | Discharge: 2019-09-14 | Disposition: A | Discharge status: Home / Self Care | Payer: Self-pay | Source: Ambulatory Visit | Attending: Radiation Oncology | Admitting: Radiation Oncology

## 2019-09-14 NOTE — Telephone Encounter (Signed)
No 8/16 los

## 2019-09-15 ENCOUNTER — Other Ambulatory Visit: Payer: Self-pay

## 2019-09-15 ENCOUNTER — Ambulatory Visit
Admission: RE | Admit: 2019-09-15 | Discharge: 2019-09-15 | Disposition: A | Discharge status: Home / Self Care | Payer: Self-pay | Source: Ambulatory Visit | Attending: Radiation Oncology | Admitting: Radiation Oncology

## 2019-09-16 ENCOUNTER — Ambulatory Visit
Admission: RE | Admit: 2019-09-16 | Discharge: 2019-09-16 | Disposition: A | Discharge status: Home / Self Care | Payer: Self-pay | Source: Ambulatory Visit | Attending: Radiation Oncology | Admitting: Radiation Oncology

## 2019-09-16 ENCOUNTER — Other Ambulatory Visit: Payer: Self-pay | Admitting: Radiation Oncology

## 2019-09-16 ENCOUNTER — Other Ambulatory Visit: Payer: Self-pay

## 2019-09-16 MED ORDER — SUCRALFATE 1 G PO TABS
1.0000 g | ORAL_TABLET | Freq: Four times a day (QID) | ORAL | 2 refills | Status: DC
Start: 2019-09-16 — End: 2020-09-06

## 2019-09-16 MED FILL — SUCRALFATE 1 GM TAB: 1 | 30 days supply | Qty: 120 | Fill #0

## 2019-09-16 NOTE — Progress Notes (Signed)
[place]   [phone] [phone]   Clinic Follow up Note   Patient Care Team: [name], MD as PCP - General (Hematology) Jonnie Finner, RN as Oncology Nurse Navigator [name], MD as Consulting Physician (Oncology)  Date of Service: 09/19/2019  CHIEF COMPLAINT: F/u ofMiddleEsophageal Squamous CellCarcinoma  SUMMARY OF ONCOLOGIC HISTORY: Oncology History Overview Note  Cancer Staging Malignant neoplasm of middle third of esophagus ([place]) Staging form: Esophagus - Squamous Cell Carcinoma, AJCC 8th Edition - Clinical: Stage Unknown (cTX, cN1, cM0) - Signed by [name], MD on 08/18/2019    Malignant neoplasm of middle third of esophagus [facility])   Initial Diagnosis   Malignant neoplasm of middle third of esophagus ([place])   08/11/2019 Imaging   CT CAP W contrast 08/11/19 IMPRESSION: 1. Mid to lower esophageal mass along an 8 cm segment, large endoluminal component, strongly favoring esophageal malignancy. Borderline enlarged AP window lymph node. No findings of distant metastatic disease. 2. Trace bilateral pleural effusions. 3. Diffuse hepatic steatosis. 4. Cholelithiasis. 5. Fatty spermatic cords likely due to small bilateral indirect inguinal hernias.   08/11/2019 Procedure   EGD by Dr Carlean Purl  IMPRESSION - Partially obstructing, likely malignant esophageal tumor was found in the upper third of the esophagus and in the middle third of the esophagus. Biopsied. Very large and long - difficult but able to advance scope by this lesion - await CT for better length estimate - Normal stomach. - Normal examined duodenum.   08/11/2019 Initial Biopsy   FINAL MICROSCOPIC DIAGNOSIS:   A. ESOPHAGUS, UPPER, BIOPSY:  - Squamous cell carcinoma.    08/18/2019 Cancer Staging   Staging form: Esophagus - Squamous Cell Carcinoma, AJCC 8th Edition - Clinical: Stage Unknown (cTX, cN1, cM0) - Signed by [name], MD on 08/18/2019   08/29/2019 -   Chemotherapy   Concurrent chemoradiation with weekly CT for 6 weeks starting 08/29/19    08/29/2019 -  Radiation Therapy   Concurrent chemoradiation with Dr Lisbeth Renshaw starting 08/29/19      CURRENT THERAPY:  Concurrent chemoradiation with weekly CT for 6 weeks starting 08/29/19.  INTERVAL HISTORY:  Jimmy Hawkins is here for a follow up and treatment. He presents to the clinic with family member and Spanish interpretor. He notes he has numbness of b/l forearms hands. He notes this also gets cold and will go away when he starts to move. He notes last week he had throbbing left flank pain. This lasted for 40 seconds and has not recurred. He is using Hycet only at night mostly but will use for q6hours if needed.     REVIEW OF SYSTEMS:   Constitutional: Denies fevers, chills or abnormal weight loss Eyes: Denies blurriness of vision Ears, nose, mouth, throat, and face: Denies mucositis or sore throat (+) Dysphagia  Respiratory: Denies cough, dyspnea or wheezes Cardiovascular: Denies palpitation, chest discomfort or lower extremity swelling Gastrointestinal:  Denies nausea, heartburn or change in bowel habits Skin: Denies abnormal skin rashes Lymphatics: Denies new lymphadenopathy or easy bruising Neurological:Denies numbness, tingling or new weaknesses (+) Cold sensitivity in b/l hands  Behavioral/Psych: Mood is stable, no new changes  All other systems were reviewed with the patient and are negative.  MEDICAL HISTORY:  Past Medical History:  Diagnosis Date  . GERD (gastroesophageal reflux disease)   . Hematemesis with nausea 08/10/2019    SURGICAL HISTORY: Past Surgical History:  Procedure Laterality Date  . BIOPSY  08/11/2019   Procedure: BIOPSY;  Surgeon: Carlean Purl,  Ofilia Neas, MD;  Location: [facility] ENDOSCOPY;  Service: Endoscopy;;  . ESOPHAGOGASTRODUODENOSCOPY  08/11/2019  . ESOPHAGOGASTRODUODENOSCOPY (EGD) WITH PROPOFOL N/A 08/11/2019   Procedure: ESOPHAGOGASTRODUODENOSCOPY (EGD)  WITH PROPOFOL;  Surgeon: [name], MD;  Location: [place];  Service: Endoscopy;  Laterality: N/A;    I have reviewed the social history and family history with the patient and they are unchanged from previous note.  ALLERGIES:  has No Known Allergies.  MEDICATIONS:  Current Outpatient Medications  Medication Sig Dispense Refill  . Ferrous Sulfate (IRON) 325 (65 Fe) MG TABS Take 1 tablet (325 mg total) by mouth 2 (two) times daily. 30 tablet 1  . HYDROcodone-acetaminophen (HYCET) 7.5-325 mg/15 ml solution Take 10 mLs by mouth every 6 (six) hours as needed for moderate pain. 473 mL 0  . omeprazole (PRILOSEC) 40 MG capsule Take 1 capsule (40 mg total) by mouth daily. 30 capsule 2  . ondansetron (ZOFRAN) 8 MG tablet Take 1 tablet (8 mg total) by mouth 2 (two) times daily as needed for refractory nausea / vomiting. Start on day 3 after chemo. 30 tablet 1  . prochlorperazine (COMPAZINE) 10 MG tablet Take 1 tablet (10 mg total) by mouth every 6 (six) hours as needed (Nausea or vomiting). 30 tablet 1  . sucralfate (CARAFATE) 1 g tablet Take 1 tablet (1 g total) by mouth 4 (four) times daily. Dissolve each tablet in 15 cc water before use. 120 tablet 2   No current facility-administered medications for this visit.    PHYSICAL EXAMINATION: ECOG PERFORMANCE STATUS: 2 - Symptomatic, <50% confined to bed  Vitals:   09/19/19 1020  BP: 118/80  Pulse: (!) 102  Resp: 20  Temp: 98 F (36.7 C)  SpO2: 100%   Filed Weights   09/19/19 1020  Weight: 167 lb 3.2 oz (75.8 kg)    Due to COVID19 we will limit examination to appearance. Patient had no complaints.  GENERAL:alert, no distress and comfortable SKIN: skin color normal, no rashes or significant lesions EYES: normal, Conjunctiva are pink and non-injected, sclera clear  NEURO: alert & oriented x 3 with fluent speech   LABORATORY DATA:  I have reviewed the data as listed CBC Latest Ref Rng & Units 09/19/2019 09/12/2019 09/05/2019    WBC 4.0 - 10.5 K/uL 1.9(L) 2.2(L) 5.0  Hemoglobin 13.0 - 17.0 g/dL 10.7(L) 10.8(L) 10.2(L)  Hematocrit 39 - 52 % 32.7(L) 33.7(L) 31.7(L)  Platelets 150 - 400 K/uL 146(L) 224 271     CMP Latest Ref Rng & Units 09/19/2019 09/12/2019 09/05/2019  Glucose 70 - 99 mg/dL 118(H) 108(H) 92  BUN 6 - 20 mg/dL '6 6 7  ' Creatinine 0.61 - 1.24 mg/dL 0.63 0.64 0.64  Sodium 135 - 145 mmol/L 135 139 134(L)  Potassium 3.5 - 5.1 mmol/L 3.8 3.8 3.9  Chloride 98 - 111 mmol/L 101 106 100  CO2 22 - 32 mmol/L '25 22 26  ' Calcium 8.9 - 10.3 mg/dL 9.1 9.4 9.2  Total Protein 6.5 - 8.1 g/dL 6.7 7.1 7.4  Total Bilirubin 0.3 - 1.2 mg/dL 0.3 0.3 <0.2(L)  Alkaline Phos 38 - 126 U/L 64 76 77  AST 15 - 41 U/L 12(L) 16 15  ALT 0 - 44 U/L '10 16 16      ' RADIOGRAPHIC STUDIES: I have personally reviewed the radiological images as listed and agreed with the findings in the report. No results found.   ASSESSMENT & PLAN:  Jimmy Hawkins is a 44 y.o. male with  1.[name], cTxN1M0 -Hewas diagnosed in 07/2019 withpresented withSquamous Cell Carcinoma as seen on EGD.08/11/19 CT CAP found him to havemidesophageal mass and enlarged AP window node, no distant metastasis.  -Ipreviouslydiscussed treatment options for locally advanced esophageal cancer, including neoadjuvant concurrent chemoradiation, followed by esophagectomy, and adjuvant immunotherapy.  -Istarted him onconcurrent chemoRT withweekly IV Carboplatin and Taxol on days of Radiation for 6 weeks beginning 08/29/19.  -S/p week 3 he has more cold sensitivity with numbness in hands. This does resolve with movement. Last week he had 1 episode of throbbing left flank pain with infusion. He will notify nurse if this occurs again.  -Labs reviewed, WBC 1.9, Hg 10.7, plt 146K, ANC 1. Will proceed with week 4 CT with dose reduce Taxol due to neutropenia and neuropathy. Will continue weekly.  -Continue Radiation.   -Follow-up next week   2.Moderate anemia secondary to GI bleeding and probable iron deficiency -He has hadrecurrentNausea and vomiting withhematemesissince 01/2019.  -He required blood transfusion on 08/15/19 and IV Feraheme on 7/16/21and 08/22/19. -Continue oral iron. -anemia overall improved   3.Heavy alcohol user -He has stopped alcohol drinking since cancer diagnosis.  Per his sister, he still has craving for alcohol, I will refer him to Education officer, museum.  4. Dysphagia, Nutrition, Weight Loss, Abdominal pain  -He initially reported progressive dysphagia with solid food for several months, he is currently on soft diet. He developedintermittent upper abdominal pain in 07/2019.  -He is still tolerating oral soft diet well, I do not think he needs a feeding tube at this point.Will monitor during chemoRT.  -His upper abdominal pain is currently controlled on Hycet once nightly up to q6hours, last refilled 09/12/19.  -I discussed his pain will likely increase from treatment, but once this improves we will start to wean him off. He can use Miralax as needed as pain medication can lead to constipation. -Weight is stable.Hecontinues to f/u with Dietician.  5. Social and Financial Support -Patient does not have insurance.He is also undocumented. -He has met withfinancial advocate Shauna and Loreli Dollar he has grant assistance with medications.   I previously  discussed for grant use he needs to use [facility].  6. Cold Sensitivity/Neuropathy  -S/p week 3 he has intermittent numbness of b/l hands with cold sensation that resolves with movement.  -I will slightly dose reduce Taxol starting with week 4. Will monitor.    PLAN: -Labs reviewed and adequate to proceed with week 4 CT with dose reduced Taxol due to neutropenia and cold sensitivity  -Continue RT  -f/u next week   No problem-specific Assessment & Plan notes found for this encounter.   No orders of the defined  types were placed in this encounter.  All questions were answered. The patient knows to call the clinic with any problems, questions or concerns. No barriers to learning was detected. The total time spent in the appointment was 30 minutes.     [name], MD 09/20/2019   I, Joslyn Devon, am acting as scribe for [name], MD.   I have reviewed the above documentation for accuracy and completeness, and I agree with the above.

## 2019-09-19 ENCOUNTER — Inpatient Hospital Stay (HOSPITAL_BASED_OUTPATIENT_CLINIC_OR_DEPARTMENT_OTHER): Payer: Self-pay | Admitting: Hematology

## 2019-09-19 ENCOUNTER — Ambulatory Visit
Admission: RE | Admit: 2019-09-19 | Discharge: 2019-09-19 | Disposition: A | Discharge status: Home / Self Care | Payer: Self-pay | Source: Ambulatory Visit | Attending: Radiation Oncology | Admitting: Radiation Oncology

## 2019-09-19 ENCOUNTER — Other Ambulatory Visit: Payer: Self-pay

## 2019-09-19 ENCOUNTER — Inpatient Hospital Stay: Payer: Self-pay | Admitting: Nutrition

## 2019-09-19 ENCOUNTER — Inpatient Hospital Stay: Payer: Self-pay

## 2019-09-19 VITALS — HR 98

## 2019-09-19 VITALS — BP 118/80 | HR 102 | Temp 98.0°F | Resp 20 | Ht 66.0 in | Wt 167.2 lb

## 2019-09-19 DIAGNOSIS — C154 Malignant neoplasm of middle third of esophagus: Secondary | ICD-10-CM

## 2019-09-19 DIAGNOSIS — D500 Iron deficiency anemia secondary to blood loss (chronic): Secondary | ICD-10-CM

## 2019-09-19 LAB — CMP (CANCER CENTER ONLY)
ALT: 10 U/L (ref 0–44)
AST: 12 U/L — ABNORMAL LOW (ref 15–41)
Albumin: 3 g/dL — ABNORMAL LOW (ref 3.5–5.0)
Alkaline Phosphatase: 64 U/L (ref 38–126)
Anion gap: 9 (ref 5–15)
BUN: 6 mg/dL (ref 6–20)
CO2: 25 mmol/L (ref 22–32)
Calcium: 9.1 mg/dL (ref 8.9–10.3)
Chloride: 101 mmol/L (ref 98–111)
Creatinine: 0.63 mg/dL (ref 0.61–1.24)
GFR, Est AFR Am: >60 mL/min (ref >60)
GFR, Estimated: >60 mL/min (ref >60)
Glucose, Bld: 118 mg/dL — ABNORMAL HIGH (ref 70–99)
Potassium: 3.8 mmol/L (ref 3.5–5.1)
Sodium: 135 mmol/L (ref 135–145)
Total Bilirubin: 0.3 mg/dL (ref 0.3–1.2)
Total Protein: 6.7 g/dL (ref 6.5–8.1)

## 2019-09-19 LAB — CBC WITH DIFFERENTIAL (CANCER CENTER ONLY)
Abs Immature Granulocytes: 0.05 10*3/uL (ref 0.00–0.07)
Basophils Absolute: 0 10*3/uL (ref 0.0–0.1)
Basophils Relative: 1 %
Eosinophils Absolute: 0 10*3/uL (ref 0.0–0.5)
Eosinophils Relative: 2 %
HCT: 32.7 % — ABNORMAL LOW (ref 39.0–52.0)
Hemoglobin: 10.7 g/dL — ABNORMAL LOW (ref 13.0–17.0)
Immature Granulocytes: 3 %
Lymphocytes Relative: 10 %
Lymphs Abs: 0.2 10*3/uL — ABNORMAL LOW (ref 0.7–4.0)
MCH: 27.3 pg (ref 26.0–34.0)
MCHC: 32.7 g/dL (ref 30.0–36.0)
MCV: 83.4 fL (ref 80.0–100.0)
Monocytes Absolute: 0.5 10*3/uL (ref 0.1–1.0)
Monocytes Relative: 26 %
Neutro Abs: 1.1 10*3/uL — ABNORMAL LOW (ref 1.7–7.7)
Neutrophils Relative %: 58 %
Platelet Count: 146 10*3/uL — ABNORMAL LOW (ref 150–400)
RBC: 3.92 MIL/uL — ABNORMAL LOW (ref 4.22–5.81)
RDW: 17.2 % — ABNORMAL HIGH (ref 11.5–15.5)
WBC Count: 1.9 10*3/uL — ABNORMAL LOW (ref 4.0–10.5)
nRBC: 0 % (ref 0.0–0.2)

## 2019-09-19 MED ORDER — FAMOTIDINE IN NACL 20-0.9 MG/50ML-% IV SOLN
20.0000 mg | Freq: Once | INTRAVENOUS | Status: AC
  Administered 2019-09-19: 20 mg via INTRAVENOUS

## 2019-09-19 MED ORDER — SODIUM CHLORIDE 0.9 % IV SOLN
225.0000 mg | Freq: Once | INTRAVENOUS | Status: AC
  Administered 2019-09-19: 230 mg via INTRAVENOUS
  Filled 2019-09-19: qty 23

## 2019-09-19 MED ORDER — SODIUM CHLORIDE 0.9 % IV SOLN
40.0000 mg/m2 | Freq: Once | INTRAVENOUS | Status: AC
  Administered 2019-09-19: 78 mg via INTRAVENOUS
  Filled 2019-09-19: qty 13

## 2019-09-19 MED ORDER — DIPHENHYDRAMINE HCL 50 MG/ML IJ SOLN
INTRAMUSCULAR | Status: AC
  Filled 2019-09-19: qty 1

## 2019-09-19 MED ORDER — SODIUM CHLORIDE 0.9 % IV SOLN: 40.0000 mg/m2 | Freq: Once | INTRAVENOUS | Status: DC

## 2019-09-19 MED ORDER — PALONOSETRON HCL INJECTION 0.25 MG/5ML
INTRAVENOUS | Status: AC
  Filled 2019-09-19: qty 5

## 2019-09-19 MED ORDER — PALONOSETRON HCL INJECTION 0.25 MG/5ML
0.2500 mg | Freq: Once | INTRAVENOUS | Status: AC
  Administered 2019-09-19: 0.25 mg via INTRAVENOUS

## 2019-09-19 MED ORDER — DIPHENHYDRAMINE HCL 50 MG/ML IJ SOLN
50.0000 mg | Freq: Once | INTRAMUSCULAR | Status: AC
  Administered 2019-09-19: 50 mg via INTRAVENOUS

## 2019-09-19 MED ORDER — FAMOTIDINE IN NACL 20-0.9 MG/50ML-% IV SOLN
INTRAVENOUS | Status: AC
  Filled 2019-09-19: qty 50

## 2019-09-19 MED ORDER — SODIUM CHLORIDE 0.9 % IV SOLN
Freq: Once | INTRAVENOUS | Status: AC
  Filled 2019-09-19: qty 250

## 2019-09-19 MED ORDER — SODIUM CHLORIDE 0.9 % IV SOLN
10.0000 mg | Freq: Once | INTRAVENOUS | Status: AC
  Administered 2019-09-19: 10 mg via INTRAVENOUS
  Filled 2019-09-19: qty 10

## 2019-09-19 NOTE — Patient Instructions (Signed)
[facility] Discharge Instructions for Patients Receiving Chemotherapy  Today you received the following chemotherapy agents: Taxol/Carbo  To help prevent nausea and vomiting after your treatment, we encourage you to take your nausea medication as directed.   If you develop nausea and vomiting that is not controlled by your nausea medication, call the clinic.   BELOW ARE SYMPTOMS THAT SHOULD BE REPORTED IMMEDIATELY:  *FEVER GREATER THAN 100.5 F  *CHILLS WITH OR WITHOUT FEVER  NAUSEA AND VOMITING THAT IS NOT CONTROLLED WITH YOUR NAUSEA MEDICATION  *UNUSUAL SHORTNESS OF BREATH  *UNUSUAL BRUISING OR BLEEDING  TENDERNESS IN MOUTH AND THROAT WITH OR WITHOUT PRESENCE OF ULCERS  *URINARY PROBLEMS  *BOWEL PROBLEMS  UNUSUAL RASH Items with * indicate a potential emergency and should be followed up as soon as possible.  Feel free to call the clinic should you have any questions or concerns. The clinic phone number is [phone].  Please show the CHEMO ALERT CARD at check-in to the Emergency Department and triage nurse.   

## 2019-09-19 NOTE — Progress Notes (Signed)
Brief nutrition follow-up completed with patient during infusion for esophageal cancer.  I met with patient and interpreter. Weight improved and documented as 167.2 pounds on August 23. Patient denies nutrition impact symptoms and reports he is eating normally. He has started to drink Ensure twice daily. He has no questions or concerns.  Nutrition diagnosis: Increased nutrient needs continue.  Intervention: Provided support and encouragement for patient to continue same strategies for increased calories and protein. Continue oral nutrition supplements twice daily.    Monitoring, evaluation, goals: Patient will tolerate increased calories and protein to minimize weight loss throughout treatment. I will monitor weight trends.  Next visit: Will be scheduled as needed.  Contact RD for any questions in the future.  **Disclaimer: This note was dictated with voice recognition software. Similar sounding words can inadvertently be transcribed and this note may contain transcription errors which may not have been corrected upon publication of note.**

## 2019-09-20 ENCOUNTER — Encounter: Payer: Self-pay | Admitting: Hematology

## 2019-09-20 ENCOUNTER — Ambulatory Visit
Admission: RE | Admit: 2019-09-20 | Discharge: 2019-09-20 | Disposition: A | Discharge status: Home / Self Care | Payer: Self-pay | Source: Ambulatory Visit | Attending: Radiation Oncology | Admitting: Radiation Oncology

## 2019-09-21 ENCOUNTER — Ambulatory Visit
Admission: RE | Admit: 2019-09-21 | Discharge: 2019-09-21 | Disposition: A | Discharge status: Home / Self Care | Payer: Self-pay | Source: Ambulatory Visit | Attending: Radiation Oncology | Admitting: Radiation Oncology

## 2019-09-21 ENCOUNTER — Telehealth: Payer: Self-pay | Admitting: Hematology

## 2019-09-21 ENCOUNTER — Other Ambulatory Visit: Payer: Self-pay

## 2019-09-21 NOTE — Telephone Encounter (Signed)
No 8/23 los

## 2019-09-22 ENCOUNTER — Ambulatory Visit
Admission: RE | Admit: 2019-09-22 | Discharge: 2019-09-22 | Disposition: A | Discharge status: Home / Self Care | Payer: Self-pay | Source: Ambulatory Visit | Attending: Radiation Oncology | Admitting: Radiation Oncology

## 2019-09-22 ENCOUNTER — Other Ambulatory Visit: Payer: Self-pay

## 2019-09-23 ENCOUNTER — Other Ambulatory Visit: Payer: Self-pay

## 2019-09-23 ENCOUNTER — Ambulatory Visit
Admission: RE | Admit: 2019-09-23 | Discharge: 2019-09-23 | Disposition: A | Discharge status: Home / Self Care | Payer: Self-pay | Source: Ambulatory Visit | Attending: Radiation Oncology | Admitting: Radiation Oncology

## 2019-09-24 NOTE — Progress Notes (Signed)
[place]   [phone] [phone]   Clinic Follow up Note   Patient Care Team: [name], MD as PCP - General (Hematology) Jonnie Finner, RN as Oncology Nurse Navigator [name], MD as Consulting Physician (Oncology) 09/26/2019  CHIEF COMPLAINT: F/u esophagus cancer   SUMMARY OF ONCOLOGIC HISTORY: Oncology History Overview Note  Cancer Staging Malignant neoplasm of middle third of esophagus ([place]) Staging form: Esophagus - Squamous Cell Carcinoma, AJCC 8th Edition - Clinical: Stage Unknown (cTX, cN1, cM0) - Signed by [name], MD on 08/18/2019    Malignant neoplasm of middle third of esophagus [facility])   Initial Diagnosis   Malignant neoplasm of middle third of esophagus ([place])   08/11/2019 Imaging   CT CAP W contrast 08/11/19 IMPRESSION: 1. Mid to lower esophageal mass along an 8 cm segment, large endoluminal component, strongly favoring esophageal malignancy. Borderline enlarged AP window lymph node. No findings of distant metastatic disease. 2. Trace bilateral pleural effusions. 3. Diffuse hepatic steatosis. 4. Cholelithiasis. 5. Fatty spermatic cords likely due to small bilateral indirect inguinal hernias.   08/11/2019 Procedure   EGD by Dr Carlean Purl  IMPRESSION - Partially obstructing, likely malignant esophageal tumor was found in the upper third of the esophagus and in the middle third of the esophagus. Biopsied. Very large and long - difficult but able to advance scope by this lesion - await CT for better length estimate - Normal stomach. - Normal examined duodenum.   08/11/2019 Initial Biopsy   FINAL MICROSCOPIC DIAGNOSIS:   A. ESOPHAGUS, UPPER, BIOPSY:  - Squamous cell carcinoma.    08/18/2019 Cancer Staging   Staging form: Esophagus - Squamous Cell Carcinoma, AJCC 8th Edition - Clinical: Stage Unknown (cTX, cN1, cM0) - Signed by [name], MD on 08/18/2019   08/29/2019 -  Chemotherapy   Concurrent chemoradiation with  weekly CT for 6 weeks starting 08/29/19    08/29/2019 -  Radiation Therapy   Concurrent chemoradiation with Dr Lisbeth Renshaw starting 08/29/19     CURRENT THERAPY: Concurrent chemoradiation with weekly CT for 6 weeks starting 08/29/19.  INTERVAL HISTORY: [name] returns for f/u and treatment as scheduled. He continues ccRT with taxol/carboplatin.  Treatment is going well.  He feels hungry, he begins to eat and feels pain and will stop.  He is eating light and soft diet.  On Monday through Wednesday he feels "fine," then pain worsens during the week of treatments.  Pain is 9 out of 10 today, takes Hycet every 6 hours which helps.  He has chest discomfort, all the time but worse when he lies down, and exertional shortness of breath both worse in past 2 weeks.  He is taking Prilosec and Carafate.  He denies fever but has chills and sweats periodically since starting treatment.  No cough, nausea, vomiting, constipation, diarrhea.  Denies mucositis.  His hands feel numb when he is asleep then stiff when he wakes up then resolves, able to function normally without difficulty.   MEDICAL HISTORY:  Past Medical History:  Diagnosis Date  . GERD (gastroesophageal reflux disease)   . Hematemesis with nausea 08/10/2019    SURGICAL HISTORY: Past Surgical History:  Procedure Laterality Date  . BIOPSY  08/11/2019   Procedure: BIOPSY;  Surgeon: [name], MD;  Location: [facility] ENDOSCOPY;  Service: Endoscopy;;  . ESOPHAGOGASTRODUODENOSCOPY  08/11/2019  . ESOPHAGOGASTRODUODENOSCOPY (EGD) WITH PROPOFOL N/A 08/11/2019   Procedure: ESOPHAGOGASTRODUODENOSCOPY (EGD) WITH PROPOFOL;  Surgeon: [name], MD;  Location: [name];  Service: Endoscopy;  Laterality: N/A;    I have reviewed the social history and family history with the patient and they are unchanged from previous note.  ALLERGIES:  has No Known Allergies.  MEDICATIONS:  Current Outpatient Medications  Medication Sig Dispense Refill  . Ferrous  Sulfate (IRON) 325 (65 Fe) MG TABS Take 1 tablet (325 mg total) by mouth 2 (two) times daily. 30 tablet 1  . HYDROcodone-acetaminophen (HYCET) 7.5-325 mg/15 ml solution Take 15 mLs by mouth every 6 (six) hours as needed for moderate pain. 473 mL 0  . omeprazole (PRILOSEC) 40 MG capsule Take 1 capsule (40 mg total) by mouth daily. 30 capsule 2  . ondansetron (ZOFRAN) 8 MG tablet Take 1 tablet (8 mg total) by mouth 2 (two) times daily as needed for refractory nausea / vomiting. Start on day 3 after chemo. 30 tablet 1  . prochlorperazine (COMPAZINE) 10 MG tablet Take 1 tablet (10 mg total) by mouth every 6 (six) hours as needed (Nausea or vomiting). 30 tablet 1  . sucralfate (CARAFATE) 1 g tablet Take 1 tablet (1 g total) by mouth 4 (four) times daily. Dissolve each tablet in 15 cc water before use. 120 tablet 2  . pantoprazole (PROTONIX) 40 MG tablet Take 1 tablet (40 mg total) by mouth 2 (two) times daily. 60 tablet 1   No current facility-administered medications for this visit.    PHYSICAL EXAMINATION: ECOG PERFORMANCE STATUS: 1 - Symptomatic but completely ambulatory  Vitals:   09/26/19 0823  BP: 121/83  Pulse: (!) 115  Resp: (!) 21  Temp: 98.6 F (37 C)  SpO2: 100%   Filed Weights   09/26/19 0823  Weight: 162 lb 14.4 oz (73.9 kg)    GENERAL:alert, no distress and comfortable SKIN: No rash to exposed skin EYES:  sclera clear LUNGS: clear with normal breathing effort HEART: Tachycardic, regular rhythm, no murmurs and no lower extremity edema ABDOMEN:abdomen soft, non-tender and normal bowel sounds NEURO: alert & oriented x 3 with fluent speech, no focal motor/sensory deficits  LABORATORY DATA:  I have reviewed the data as listed CBC Latest Ref Rng & Units 09/26/2019 09/19/2019 09/12/2019  WBC 4.0 - 10.5 K/uL 2.0(L) 1.9(L) 2.2(L)  Hemoglobin 13.0 - 17.0 g/dL 10.9(L) 10.7(L) 10.8(L)  Hematocrit 39 - 52 % 32.9(L) 32.7(L) 33.7(L)  Platelets 150 - 400 K/uL 152 146(L) 224      CMP Latest Ref Rng & Units 09/26/2019 09/19/2019 09/12/2019  Glucose 70 - 99 mg/dL 131(H) 118(H) 108(H)  BUN 6 - 20 mg/dL 5(L) 6 6  Creatinine 0.61 - 1.24 mg/dL 0.65 0.63 0.64  Sodium 135 - 145 mmol/L 135 135 139  Potassium 3.5 - 5.1 mmol/L 3.7 3.8 3.8  Chloride 98 - 111 mmol/L 102 101 106  CO2 22 - 32 mmol/L _0 Calcium 8.9 - 10.3 mg/dL 9.7 9.1 9.4  Total Protein 6.5 - 8.1 g/dL 6.7 6.7 7.1  Total Bilirubin 0.3 - 1.2 mg/dL 0.5 0.3 0.3  Alkaline Phos 38 - 126 U/L 56 64 76  AST 15 - 41 U/L 12(L) 12(L) 16  ALT 0 - 44 U/L _1 RADIOGRAPHIC STUDIES: I have personally reviewed the radiological images as listed and agreed with the findings in the report. DG Chest 2 View  Result Date: 09/26/2019 CLINICAL DATA:  Esophageal cancer, neutropenia EXAM: CHEST - 2 VIEW COMPARISON:  Radiograph 07/13/2009 FINDINGS: No consolidation, features of edema, pneumothorax, or effusion. The cardiomediastinal contours are  unremarkable. No acute osseous or soft tissue abnormality. Degenerative changes are present in the imaged spine and shoulders. IMPRESSION: No acute cardiopulmonary abnormality. Electronically Signed   By: Lovena Le M.D.   On: 09/26/2019 15:23     ASSESSMENT & PLAN: Athony Coppa Hernandezis a 44 y.o.malewith   1. 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 Chest discomfort worsens when supine.  -pain is non-exertional. EKG shows sinus tachy otherwise unremarkable.  -this is likely related to radiation esophagitis, I recommend to continue carafate. I changed omeprazole to pantoprazole BID. Will increase hycet with plans to decrease quickly with adequate pain control  2. Exertional dyspnea, chills, tachycardia  -he has chills intermittently but frequently, dyspnea worsened in past 2 weeks -appears dehydrated today, will give IVF with chemo and again later this week.  -he is afebrile. Given his neutropenia will obtain ID work  up -UA and chest xray today are normal. This is likely related to chemoRT and dehydration -increase po liquids  3.[name], cTxN1M0 -he presented with6 months of intermittenthematemesisand progressive Dysphagia and Upper abdominal pain.08/11/19 CT CAP found him to havemidesophageal mass and enlarged AP window node, no distant metastasis. EGD on 7/15/21biopsyshowed Squamous Cell Carcinoma.  PET scan not done due to self-pay -Began neoadjuvant ccRT per Dr Lisbeth Renshaw and carbo/Taxol on 08/29/2019.  Goal is curative -Treatment plan includes esophagectomy and adjuvant immunotherapy to follow; will refer to cardiothoracic surgery before he completes radiation -s/p week 4, tolerating well overall with increased dysphagia/odynophagia which is likely related to RT. Tolerating chemo well.   4.Moderate anemia secondary to GI bleeding and probable iron deficiency -He has hadrecurrentNausea and vomiting withhematemesissince 01/2019.  -He required blood transfusion on 08/15/19 and IV Feraheme on 08/12/19.  -Continue oral iron. -Hgb stable on chemo  5.Heavy alcohol user -Previously counseled on alcohol cessation  6. Dysphagia, Nutrition, Weight Loss, Abdominal pain  -He reports progressive dysphagia with solid food for the past months, he is currently on soft diet.  -He is still tolerating oral soft diet but with more dysphagia and odynophagia, he has lost 5 lbs since last week   -Pain managed on Hycet every 6 hours as needed.  I have increased the dose for now, plan to ween as quickly as possible as appropriate  7. Social and Acupuncturist -Patient does not have insurance.He is also undocumented. -Met with financial advocate Shauna and SW to discuss grant/assistance  PLAN: -Labs reviewed, ANC 1.2 - proceed with week 5 carbo/taxol  -IVF with chemo today then again Thursday or Friday -UA and chest xray -negative -EKG reviewed -Increase  hycet -Change omeprazole to pantoprazole BID -F/u next week with final chemo if doing well, otherwise will hold last dose  -Reviewed with Dr. Burr Medico   Orders Placed This Encounter  Procedures  . Urine Culture    Standing Status:   Future    Number of Occurrences:   1    Standing Expiration Date:   09/25/2020  . DG Chest 2 View    Standing Status:   Future    Number of Occurrences:   1    Standing Expiration Date:   09/25/2020    Order Specific Question:   Reason for Exam (SYMPTOM  OR DIAGNOSIS REQUIRED)    Answer:   esophagus cancer, chest pain, dyspnea, neutropenia r/o infection    Order Specific Question:   Preferred imaging location?    Answer:   [facility]    Order Specific Question:  Radiology Contrast Protocol - do NOT remove file path    Answer:   [email or web address]  . Urinalysis, Complete w Microscopic    Standing Status:   Future    Number of Occurrences:   1    Standing Expiration Date:   09/25/2020  . EKG 12-Lead   All questions were answered. The patient knows to call the clinic with any problems, questions or concerns. No barriers to learning was detected. Total encounter time was 40 minutes.    Alla Feeling, NP 09/26/19

## 2019-09-26 ENCOUNTER — Encounter: Payer: Self-pay | Admitting: Nurse Practitioner

## 2019-09-26 ENCOUNTER — Inpatient Hospital Stay: Payer: Self-pay

## 2019-09-26 ENCOUNTER — Other Ambulatory Visit: Payer: Self-pay

## 2019-09-26 ENCOUNTER — Telehealth: Payer: Self-pay | Admitting: Nurse Practitioner

## 2019-09-26 ENCOUNTER — Inpatient Hospital Stay (HOSPITAL_BASED_OUTPATIENT_CLINIC_OR_DEPARTMENT_OTHER): Payer: Self-pay | Admitting: Nurse Practitioner

## 2019-09-26 ENCOUNTER — Ambulatory Visit
Admission: RE | Admit: 2019-09-26 | Discharge: 2019-09-26 | Disposition: A | Discharge status: Home / Self Care | Payer: Self-pay | Source: Ambulatory Visit | Attending: Radiation Oncology | Admitting: Radiation Oncology

## 2019-09-26 ENCOUNTER — Telehealth: Payer: Self-pay

## 2019-09-26 ENCOUNTER — Ambulatory Visit (HOSPITAL_COMMUNITY)
Admission: RE | Admit: 2019-09-26 | Discharge: 2019-09-26 | Disposition: A | Discharge status: Home / Self Care | Payer: Self-pay | Source: Ambulatory Visit | Attending: Nurse Practitioner | Admitting: Nurse Practitioner

## 2019-09-26 ENCOUNTER — Other Ambulatory Visit: Payer: Self-pay | Admitting: Hematology

## 2019-09-26 VITALS — HR 100 | Resp 18

## 2019-09-26 VITALS — BP 121/83 | HR 115 | Temp 98.6°F | Resp 21 | Ht 66.0 in | Wt 162.9 lb

## 2019-09-26 DIAGNOSIS — C154 Malignant neoplasm of middle third of esophagus: Secondary | ICD-10-CM

## 2019-09-26 LAB — CBC WITH DIFFERENTIAL (CANCER CENTER ONLY)
Abs Immature Granulocytes: 0.02 10*3/uL (ref 0.00–0.07)
Basophils Absolute: 0 10*3/uL (ref 0.0–0.1)
Basophils Relative: 1 %
Eosinophils Absolute: 0 10*3/uL (ref 0.0–0.5)
Eosinophils Relative: 2 %
HCT: 32.9 % — ABNORMAL LOW (ref 39.0–52.0)
Hemoglobin: 10.9 g/dL — ABNORMAL LOW (ref 13.0–17.0)
Immature Granulocytes: 1 %
Lymphocytes Relative: 11 %
Lymphs Abs: 0.2 10*3/uL — ABNORMAL LOW (ref 0.7–4.0)
MCH: 27.9 pg (ref 26.0–34.0)
MCHC: 33.1 g/dL (ref 30.0–36.0)
MCV: 84.1 fL (ref 80.0–100.0)
Monocytes Absolute: 0.5 10*3/uL (ref 0.1–1.0)
Monocytes Relative: 24 %
Neutro Abs: 1.2 10*3/uL — ABNORMAL LOW (ref 1.7–7.7)
Neutrophils Relative %: 61 %
Platelet Count: 152 10*3/uL (ref 150–400)
RBC: 3.91 MIL/uL — ABNORMAL LOW (ref 4.22–5.81)
RDW: 17.6 % — ABNORMAL HIGH (ref 11.5–15.5)
WBC Count: 2 10*3/uL — ABNORMAL LOW (ref 4.0–10.5)
nRBC: 0 % (ref 0.0–0.2)

## 2019-09-26 LAB — URINALYSIS, COMPLETE (UACMP) WITH MICROSCOPIC
Bilirubin Urine: NEGATIVE
Glucose, UA: NEGATIVE mg/dL
Hgb urine dipstick: NEGATIVE
Ketones, ur: NEGATIVE mg/dL
Leukocytes,Ua: NEGATIVE
Nitrite: NEGATIVE
Protein, ur: NEGATIVE mg/dL
Specific Gravity, Urine: 1.015 (ref 1.005–1.030)
pH: 6 (ref 5.0–8.0)

## 2019-09-26 LAB — CMP (CANCER CENTER ONLY)
ALT: 8 U/L (ref 0–44)
AST: 12 U/L — ABNORMAL LOW (ref 15–41)
Albumin: 3 g/dL — ABNORMAL LOW (ref 3.5–5.0)
Alkaline Phosphatase: 56 U/L (ref 38–126)
Anion gap: 8 (ref 5–15)
BUN: 5 mg/dL — ABNORMAL LOW (ref 6–20)
CO2: 25 mmol/L (ref 22–32)
Calcium: 9.7 mg/dL (ref 8.9–10.3)
Chloride: 102 mmol/L (ref 98–111)
Creatinine: 0.65 mg/dL (ref 0.61–1.24)
GFR, Est AFR Am: >60 mL/min (ref >60)
GFR, Estimated: >60 mL/min (ref >60)
Glucose, Bld: 131 mg/dL — ABNORMAL HIGH (ref 70–99)
Potassium: 3.7 mmol/L (ref 3.5–5.1)
Sodium: 135 mmol/L (ref 135–145)
Total Bilirubin: 0.5 mg/dL (ref 0.3–1.2)
Total Protein: 6.7 g/dL (ref 6.5–8.1)

## 2019-09-26 MED ORDER — SODIUM CHLORIDE 0.9 % IV SOLN
40.0000 mg/m2 | Freq: Once | INTRAVENOUS | Status: AC
  Administered 2019-09-26: 78 mg via INTRAVENOUS
  Filled 2019-09-26: qty 13

## 2019-09-26 MED ORDER — PALONOSETRON HCL INJECTION 0.25 MG/5ML
0.2500 mg | Freq: Once | INTRAVENOUS | Status: AC
  Administered 2019-09-26: 0.25 mg via INTRAVENOUS

## 2019-09-26 MED ORDER — FAMOTIDINE IN NACL 20-0.9 MG/50ML-% IV SOLN
20.0000 mg | Freq: Once | INTRAVENOUS | Status: AC
  Administered 2019-09-26: 20 mg via INTRAVENOUS

## 2019-09-26 MED ORDER — DIPHENHYDRAMINE HCL 50 MG/ML IJ SOLN
50.0000 mg | Freq: Once | INTRAMUSCULAR | Status: AC
  Administered 2019-09-26: 50 mg via INTRAVENOUS

## 2019-09-26 MED ORDER — PALONOSETRON HCL INJECTION 0.25 MG/5ML
INTRAVENOUS | Status: AC
  Filled 2019-09-26: qty 5

## 2019-09-26 MED ORDER — SODIUM CHLORIDE 0.9 % IV SOLN
225.0000 mg | Freq: Once | INTRAVENOUS | Status: AC
  Administered 2019-09-26: 230 mg via INTRAVENOUS
  Filled 2019-09-26: qty 23

## 2019-09-26 MED ORDER — FAMOTIDINE IN NACL 20-0.9 MG/50ML-% IV SOLN
INTRAVENOUS | Status: AC
  Filled 2019-09-26: qty 50

## 2019-09-26 MED ORDER — DIPHENHYDRAMINE HCL 50 MG/ML IJ SOLN
INTRAMUSCULAR | Status: AC
  Filled 2019-09-26: qty 1

## 2019-09-26 MED ORDER — PANTOPRAZOLE SODIUM 40 MG PO TBEC: 40.0000 mg | DELAYED_RELEASE_TABLET | Freq: Two times a day (BID) | ORAL | 1 refills | Status: DC

## 2019-09-26 MED ORDER — SODIUM CHLORIDE 0.9 % IV SOLN
Freq: Once | INTRAVENOUS | Status: AC
  Filled 2019-09-26: qty 250

## 2019-09-26 MED ORDER — HYDROCODONE-ACETAMINOPHEN 7.5-325 MG/15ML PO SOLN
15.0000 mL | Freq: Four times a day (QID) | ORAL | 0 refills | Status: DC | PRN
Start: 2019-09-26 — End: 2019-09-26

## 2019-09-26 MED ORDER — IRON 325 (65 FE) MG PO TABS
1.0000 | ORAL_TABLET | Freq: Two times a day (BID) | ORAL | 1 refills | Status: DC
Start: 2019-09-26 — End: 2019-12-01

## 2019-09-26 MED ORDER — HYDROCODONE-ACETAMINOPHEN 7.5-325 MG/15ML PO SOLN
15.0000 mL | Freq: Four times a day (QID) | ORAL | 0 refills | Status: DC | PRN
Start: 2019-09-26 — End: 2019-10-06

## 2019-09-26 MED ORDER — SODIUM CHLORIDE 0.9 % IV SOLN
10.0000 mg | Freq: Once | INTRAVENOUS | Status: AC
  Administered 2019-09-26: 10 mg via INTRAVENOUS
  Filled 2019-09-26: qty 10

## 2019-09-26 MED FILL — HYDROCOD-APAP 7.5-325/15ML: 7.5-325 | 7 days supply | Qty: 473 | Fill #0

## 2019-09-26 MED FILL — FERROUS SULFATE 325 MG TAB: 325 (65 FE) | 15 days supply | Qty: 30 | Fill #1

## 2019-09-26 NOTE — Progress Notes (Signed)
Per Lacie Burton, NP, ok to treat with ANC 1.2. 

## 2019-09-26 NOTE — Telephone Encounter (Signed)
Patient and family came to [facility] stating the pharmacy did not have his pain medication prescription.  Rx sent to [facility] pharmacy.  They request prescription be sent to [facility] long op pharmacy.  Forwarded to Cira Rue, NP

## 2019-09-26 NOTE — Telephone Encounter (Signed)
Scheduled per 8/30 los. Messaged RN Denny Peon to give pt appt calendar during treatment.

## 2019-09-26 NOTE — Patient Instructions (Signed)
[facility] Discharge Instructions for Patients Receiving Chemotherapy  Today you received the following chemotherapy agents: paclitaxel and carboplatin.  To help prevent nausea and vomiting after your treatment, we encourage you to take your nausea medication as directed.   If you develop nausea and vomiting that is not controlled by your nausea medication, call the clinic.   BELOW ARE SYMPTOMS THAT SHOULD BE REPORTED IMMEDIATELY:  *FEVER GREATER THAN 100.5 F  *CHILLS WITH OR WITHOUT FEVER  NAUSEA AND VOMITING THAT IS NOT CONTROLLED WITH YOUR NAUSEA MEDICATION  *UNUSUAL SHORTNESS OF BREATH  *UNUSUAL BRUISING OR BLEEDING  TENDERNESS IN MOUTH AND THROAT WITH OR WITHOUT PRESENCE OF ULCERS  *URINARY PROBLEMS  *BOWEL PROBLEMS  UNUSUAL RASH Items with * indicate a potential emergency and should be followed up as soon as possible.  Feel free to call the clinic should you have any questions or concerns. The clinic phone number is [phone].  Please show the CHEMO ALERT CARD at check-in to the Emergency Department and triage nurse.   

## 2019-09-26 NOTE — Progress Notes (Signed)
Pt inquired about iron refill 1 noted on script

## 2019-09-27 ENCOUNTER — Ambulatory Visit
Admission: RE | Admit: 2019-09-27 | Discharge: 2019-09-27 | Disposition: A | Discharge status: Home / Self Care | Payer: Self-pay | Source: Ambulatory Visit | Attending: Radiation Oncology | Admitting: Radiation Oncology

## 2019-09-27 LAB — URINE CULTURE: Culture: <10000 — AB

## 2019-09-28 ENCOUNTER — Ambulatory Visit
Admission: RE | Admit: 2019-09-28 | Discharge: 2019-09-28 | Disposition: A | Discharge status: Home / Self Care | Payer: Self-pay | Source: Ambulatory Visit | Attending: Radiation Oncology | Admitting: Radiation Oncology

## 2019-09-28 ENCOUNTER — Telehealth: Payer: Self-pay

## 2019-09-28 ENCOUNTER — Other Ambulatory Visit: Payer: Self-pay

## 2019-09-28 DIAGNOSIS — C158 Malignant neoplasm of overlapping sites of esophagus: Secondary | ICD-10-CM | POA: Insufficient documentation

## 2019-09-28 DIAGNOSIS — C154 Malignant neoplasm of middle third of esophagus: Secondary | ICD-10-CM | POA: Insufficient documentation

## 2019-09-28 NOTE — Telephone Encounter (Signed)
Called pt no answer was able to leave a message explaining reason for call no emergency to go over lab results asked pt for a return call to cancer center and also advised to call for any questions concerns or changes.

## 2019-09-28 NOTE — Telephone Encounter (Signed)
-----   Message from Alla Feeling, NP sent at 09/26/2019  4:45 PM EDT ----- Please let the patient know his chest x-ray and urine from today are negative/normal, no acute infection. His symptoms are likely from chemo/radiation and from the tumor itself.  Please encourage him to drink more water to stay hydrated and call us if his symptoms worsen this week. Thanks, Regan Rakers

## 2019-09-29 ENCOUNTER — Ambulatory Visit
Admission: RE | Admit: 2019-09-29 | Discharge: 2019-09-29 | Disposition: A | Discharge status: Home / Self Care | Payer: Self-pay | Source: Ambulatory Visit | Attending: Radiation Oncology | Admitting: Radiation Oncology

## 2019-09-29 ENCOUNTER — Other Ambulatory Visit: Payer: Self-pay

## 2019-09-29 NOTE — Telephone Encounter (Signed)
Disregard you already filled but I cant get it off my list unless I send it to you

## 2019-09-30 ENCOUNTER — Inpatient Hospital Stay: Payer: Self-pay

## 2019-09-30 ENCOUNTER — Ambulatory Visit
Admission: RE | Admit: 2019-09-30 | Discharge: 2019-09-30 | Disposition: A | Discharge status: Home / Self Care | Payer: Self-pay | Source: Ambulatory Visit | Attending: Radiation Oncology | Admitting: Radiation Oncology

## 2019-09-30 ENCOUNTER — Telehealth: Payer: Self-pay | Admitting: Hematology

## 2019-09-30 ENCOUNTER — Other Ambulatory Visit: Payer: Self-pay

## 2019-09-30 NOTE — Telephone Encounter (Signed)
Spoke with patient and interpreter regarding rescheduled 09/03 appointment to 09/04. Patient is notified.

## 2019-10-01 ENCOUNTER — Other Ambulatory Visit: Payer: Self-pay

## 2019-10-01 ENCOUNTER — Inpatient Hospital Stay: Payer: Self-pay | Attending: Hematology

## 2019-10-01 VITALS — BP 116/79 | HR 97 | Temp 99.1°F | Resp 20

## 2019-10-01 DIAGNOSIS — R1310 Dysphagia, unspecified: Secondary | ICD-10-CM | POA: Insufficient documentation

## 2019-10-01 DIAGNOSIS — Z5111 Encounter for antineoplastic chemotherapy: Secondary | ICD-10-CM | POA: Insufficient documentation

## 2019-10-01 DIAGNOSIS — F1099 Alcohol use, unspecified with unspecified alcohol-induced disorder: Secondary | ICD-10-CM | POA: Insufficient documentation

## 2019-10-01 DIAGNOSIS — D500 Iron deficiency anemia secondary to blood loss (chronic): Secondary | ICD-10-CM | POA: Insufficient documentation

## 2019-10-01 DIAGNOSIS — C154 Malignant neoplasm of middle third of esophagus: Secondary | ICD-10-CM | POA: Insufficient documentation

## 2019-10-01 MED ORDER — SODIUM CHLORIDE 0.9 % IV SOLN
Freq: Once | INTRAVENOUS | Status: AC
  Filled 2019-10-01: qty 250

## 2019-10-01 NOTE — Patient Instructions (Signed)
Deshidratacin en los adultos Dehydration, Adult La deshidratacin es una afeccin que se caracteriza por una cantidad insuficiente de agua u otros lquidos en el organismo. Esto sucede cuando una persona pierde ms lquidos de los que consume. Los [employer], como los riones, el cerebro y el corazn, no pueden funcionar sin la cantidad [place] de lquidos. Cualquier prdida de lquidos del organismo puede causar deshidratacin. La deshidratacin puede ser leve, moderada o grave. Debe tratarse de inmediato para evitar que se agrave. Cules son las causas? Las causas de la deshidratacin pueden ser las siguientes:  Afecciones que [place] que se pierda agua u otros lquidos, [place], vmitos, o sudar u Education administrator.  No beber suficientes lquidos, especialmente cuando est enfermo o realiza actividades que requieren Automatic Data.  Otras enfermedades y afecciones, como fiebre o infeccin.  Determinados medicamentos, como aquellos que eliminan el exceso de lquido del cuerpo (diurticos).  Falta de agua potable segura.  No poder obtener suficiente agua y alimentos. Qu incrementa el riesgo? Los siguientes factores pueden hacer que sea ms propenso a Armed forces training and education officer afeccin:  Tener una enfermedad prolongada (crnica) que no se ha tratado Product manager, como diabetes, enfermedad cardaca o enfermedad renal.  Ser mayor de 58aos de edad.  Tener una discapacidad.  Vivir en un lugar de gran altitud, donde el aire menos denso y ms seco causa ms prdida de lquidos.  Hacer ejercicios que sobrecargan el cuerpo durante mucho tiempo (deportes de resistencia). Cules son los signos o sntomas? Los sntomas de deshidratacin dependen de su gravedad. Deshidratacin leve o moderada  Sed.  Sequedad en los labios o la boca.  Mareos o sensacin de desvanecimiento, especialmente al ponerse de pie despus de estar sentado.  Calambres musculares.  Orina de color oscuro. La  orina puede ser color t.  Menor produccin de [place] o lgrimas que lo normal.  Dolor de [place]. Deshidratacin grave  Cambios en la piel. La piel puede estar fra y pegajosa, con manchas o plida. Tambin es posible que la piel no vuelva a la normalidad despus de Nutritional therapist y [place].  Escasa produccin o ausencia de lgrimas, orina o sudor.  Cambios en los signos vitales, como respiracin rpida y presin arterial baja. El pulso puede estar dbil o puede tener ms de 100 latidos por minuto cuando est sentado quieto.  Otros cambios, por ejemplo: ? Catering manager sed. ? Ojos hundidos. ? Manos y pies fros. ? Confusin. ? Estar muy cansado (aletargado) o tener problemas para despertarse. ? Prdida de peso a Control and instrumentation engineer. ? Prdida de la conciencia. Cmo se diagnostica? Esta afeccin se diagnostica en funcin de los sntomas y de un examen fsico. Se le pueden hacer anlisis de sangre y [place] para ayudar a confirmar el diagnstico. Cmo se trata? El tratamiento de esta afeccin depende de su gravedad. El tratamiento se debe comenzar de inmediato. No espere hasta que la deshidratacin sea grave. La deshidratacin grave es Engineer, maintenance (IT) y debe tratarse en un hospital.  La deshidratacin leve o moderada puede tratarse en la casa. Le pedirn que: ? Beba ms lquidos. ? Beba una solucin de rehidratacin oral (SRO). Esta bebida ayuda a restablecer las cantidades [place] de lquidos y de sales y minerales en la sangre (electrolitos).  La deshidratacin grave puede tratarse de la siguiente manera: ? Con lquidos intravenosos (i.v.). ? Corrigiendo los niveles anormales de Brewing technologist. A menudo, esto se realiza administrando electrolitos a travs de un tubo que se pasa por la Scientist, water quality llegar al [employer] (sonda  nasogstrica o sonda NG). ? Tratando la causa subyacente de la deshidratacin. Siga estas instrucciones en su casa: Solucin de rehidratacin oral Si se lo indic  el mdico, tome una SRO:  Para preparar una SRO, siga las instrucciones del envase.  Comience por beber pequeas cantidades, aproximadamente taza (174ml) cada 5 a 32minutos.  Aumente lentamente la cantidad que bebe hasta que haya ingerido la cantidad recomendada por el mdico. Comida y bebida         Beba suficiente lquido transparente como para Theatre manager la orina de color amarillo plido. Si le indicaron que beba una SRO, termine primero la solucin y luego empiece a beber lentamente otros lquidos transparentes. Beba lquidos, por ejemplo: ? [place]. No beba solamente agua. Esto puede provocar hiponatremia, que es tener escasez de sal (sodio) en el organismo. ? Agua de trocitos de hielo que usted succiona. ? Jugo de frutas con agregado de agua (jugo de frutas diluido). ? Bebidas deportivas de bajas caloras.  Consuma los alimentos que contienen un equilibrio saludable de Brewing technologist, como las bananas, las [place], las papas, los tomates y Nurse, mental health.  No beba alcohol.  Evite lo siguiente: ? Bebidas que contengan gran cantidad de azcar. Entre estas se incluyen las bebidas deportivas ricas en caloras, el jugo de frutas sin diluir y los refrescos o gaseosas. ? Cafena. ? Los alimentos con alto contenido de grasa o Location manager. Instrucciones generales  Use los medicamentos de venta libre y los recetados solamente como se lo haya indicado el mdico.  No tome comprimidos de sodio. Esto puede causar la acumulacin excesiva de sodio en el organismo (hipernatremia).  Retome sus actividades normales como se lo haya indicado el mdico. Pregntele al mdico qu actividades son seguras para usted.  Concurra a todas las visitas de seguimiento como se lo haya indicado el mdico. Esto es importante. Comunquese con un mdico si:  Tiene calambres musculares, dolor o molestias, por ejemplo: ? Dolor en el abdomen y el dolor empeora o se mantiene en un rea (localizado). ? Rigidez en el  cuello.  Tiene una erupcin cutnea.  Se siente ms irritable de lo habitual.  Est ms somnoliento o tiene ms dificultad para despertarse de lo habitual.  Se siente dbil o mareado.  Tiene mucha sed. Solicite ayuda inmediatamente si tiene:  Algn sntoma de deshidratacin grave.  Sntomas de vmitos, por ejemplo: ? No puede comer ni beber sin vomitar. ? Los vmitos empeoran o no desaparecen. ? El vmito tiene sangre o una sustancia verde (bilis).  Sntomas que empeoran con [employer].  Cristy Hilts.  Dolor de cabeza intenso.  Problemas con la miccin o las deposiciones, por ejemplo: ? Diarrea que empeora o que no desaparece. ? Sangre en la materia fecal (heces). Esto puede hacer que la materia fecal sea negra y de aspecto alquitranado. ? No orina u orina solamente una pequea cantidad de color muy oscuro en el trmino de 6 a 8horas.  Dificultad para respirar. Estos sntomas pueden representar un problema grave que constituye Engineer, maintenance (IT). No espere a ver si los sntomas desaparecen. Solicite atencin mdica de inmediato. Comunquese con el servicio de emergencias de su localidad (911 en los [place]). No conduzca por sus propios medios [employer] hospital. Resumen  La deshidratacin es una afeccin que se caracteriza por una cantidad insuficiente de agua u otros lquidos en el organismo. Esto sucede cuando una persona pierde ms lquidos de los que consume.  El tratamiento de esta afeccin depende de su gravedad. [place] se  debe comenzar de inmediato. No espere hasta que la deshidratacin sea grave.  Beba suficiente lquido transparente como para mantener la orina de color amarillo plido. Si le indicaron que beba una solucin de rehidratacin oral (SRO), termine primero la solucin y luego empiece a beber lentamente otros lquidos transparentes.  Use los medicamentos de venta libre y los recetados solamente como se lo haya indicado el mdico.  Obtenga ayuda  de inmediato si tiene algn sntoma de deshidratacin grave. Esta informacin no tiene Marine scientist el consejo del mdico. Asegrese de hacerle al mdico cualquier pregunta que tenga. Document Revised: 09/30/2018 Document Reviewed: 09/30/2018 Elsevier Patient Education  [place].

## 2019-10-03 NOTE — Progress Notes (Signed)
[place]   [phone] [phone]) [phone]   Clinic Follow up Note   Patient Care Team: [name], MD as PCP - General (Hematology) Jonnie Finner, RN as Oncology Nurse Navigator [name], MD as Consulting Physician (Oncology) 10/04/2019  CHIEF COMPLAINT: F/u esophagus cancer   SUMMARY OF ONCOLOGIC HISTORY: Oncology History Overview Note  Cancer Staging Malignant neoplasm of middle third of esophagus ([place]) Staging form: Esophagus - Squamous Cell Carcinoma, AJCC 8th Edition - Clinical: Stage Unknown (cTX, cN1, cM0) - Signed by [name], MD on 08/18/2019    Malignant neoplasm of middle third of esophagus [facility])   Initial Diagnosis   Malignant neoplasm of middle third of esophagus ([place])   08/11/2019 Imaging   CT CAP W contrast 08/11/19 IMPRESSION: 1. Mid to lower esophageal mass along an 8 cm segment, large endoluminal component, strongly favoring esophageal malignancy. Borderline enlarged AP window lymph node. No findings of distant metastatic disease. 2. Trace bilateral pleural effusions. 3. Diffuse hepatic steatosis. 4. Cholelithiasis. 5. Fatty spermatic cords likely due to small bilateral indirect inguinal hernias.   08/11/2019 Procedure   EGD by Dr Carlean Purl  IMPRESSION - Partially obstructing, likely malignant esophageal tumor was found in the upper third of the esophagus and in the middle third of the esophagus. Biopsied. Very large and long - difficult but able to advance scope by this lesion - await CT for better length estimate - Normal stomach. - Normal examined duodenum.   08/11/2019 Initial Biopsy   FINAL MICROSCOPIC DIAGNOSIS:   A. ESOPHAGUS, UPPER, BIOPSY:  - Squamous cell carcinoma.    08/18/2019 Cancer Staging   Staging form: Esophagus - Squamous Cell Carcinoma, AJCC 8th Edition - Clinical: Stage Unknown (cTX, cN1, cM0) - Signed by [name], MD on 08/18/2019   08/29/2019 -  Chemotherapy   Concurrent chemoradiation with  weekly CT for 6 weeks starting 08/29/19    08/29/2019 -  Radiation Therapy   Concurrent chemoradiation with Dr Lisbeth Renshaw starting 08/29/19     CURRENT THERAPY: Concurrent chemoradiation with weekly CT for 6 weeks starting 08/29/19.  INTERVAL HISTORY: [name] returns for f/u and treatment as scheduled. He continues ccRT with dose-reduced taxol/carboplatin. He is doing well today. He received IVF on 9/4 and he thinks that made him vomit especially after drinking too much water at once. He threw up maybe 10 times over Sunday and Monday. Anti-emetics helped. He feels better today. He always feels better and can eat more after the chemo infusions. He has dysphagia but not much odynophagia. He is eating small amounts soft diet. He can drink well. He takes hycet and carafate 2-3 times per day which is effective. BMs normal. He has mild intermittent numbness to fingertips, can function without difficulty. Stable on chemo. None in toes. He clears his throat but no significant cough, chest pain, dyspnea. No fever, chills. He desires to have last chemo today. He has not seen a Psychologist, sport and exercise yet. He notes he can stop drinking alcohol on his own.    MEDICAL HISTORY:  Past Medical History:  Diagnosis Date  . GERD (gastroesophageal reflux disease)   . Hematemesis with nausea 08/10/2019    SURGICAL HISTORY: Past Surgical History:  Procedure Laterality Date  . BIOPSY  08/11/2019   Procedure: BIOPSY;  Surgeon: [name], MD;  Location: [facility] ENDOSCOPY;  Service: Endoscopy;;  . ESOPHAGOGASTRODUODENOSCOPY  08/11/2019  . ESOPHAGOGASTRODUODENOSCOPY (EGD) WITH PROPOFOL N/A 08/11/2019   Procedure: ESOPHAGOGASTRODUODENOSCOPY (EGD) WITH PROPOFOL;  Surgeon: [name],  MD;  Location: [place] ENDOSCOPY;  Service: Endoscopy;  Laterality: N/A;    I have reviewed the social history and family history with the patient and they are unchanged from previous note.  ALLERGIES:  has No Known Allergies.  MEDICATIONS:  Current  Outpatient Medications  Medication Sig Dispense Refill  . Ferrous Sulfate (IRON) 325 (65 Fe) MG TABS Take 1 tablet (325 mg total) by mouth 2 (two) times daily. 30 tablet 1  . HYDROcodone-acetaminophen (HYCET) 7.5-325 mg/15 ml solution Take 15 mLs by mouth every 6 (six) hours as needed for moderate pain. 473 mL 0  . omeprazole (PRILOSEC) 40 MG capsule Take 1 capsule (40 mg total) by mouth daily. 30 capsule 2  . ondansetron (ZOFRAN) 8 MG tablet Take 1 tablet (8 mg total) by mouth 2 (two) times daily as needed for refractory nausea / vomiting. Start on day 3 after chemo. 30 tablet 1  . pantoprazole (PROTONIX) 40 MG tablet Take 1 tablet (40 mg total) by mouth 2 (two) times daily. 60 tablet 1  . prochlorperazine (COMPAZINE) 10 MG tablet Take 1 tablet (10 mg total) by mouth every 6 (six) hours as needed (Nausea or vomiting). 30 tablet 1  . sucralfate (CARAFATE) 1 g tablet Take 1 tablet (1 g total) by mouth 4 (four) times daily. Dissolve each tablet in 15 cc water before use. 120 tablet 2   No current facility-administered medications for this visit.    PHYSICAL EXAMINATION: ECOG PERFORMANCE STATUS: 1 - Symptomatic but completely ambulatory  Vitals:   10/04/19 0833  BP: 115/73  Pulse: 92  Resp: 20  Temp: (!) 96.6 F (35.9 C)  SpO2: 98%   Filed Weights   10/04/19 0833  Weight: 160 lb 9.6 oz (72.8 kg)    GENERAL:alert, no distress and comfortable SKIN: Mild erythema at the lower chest and upper back in the radiation treatment field EYES: sclera clear LUNGS: clear with normal breathing effort HEART: regular rate & rhythm, no lower extremity edema NEURO: alert & oriented x 3 with fluent speech, intact peripheral vibratory sense over the fingertips per tuning fork exam  LABORATORY DATA:  I have reviewed the data as listed CBC Latest Ref Rng & Units 10/04/2019 09/26/2019 09/19/2019  WBC 4.0 - 10.5 K/uL 2.4(L) 2.0(L) 1.9(L)  Hemoglobin 13.0 - 17.0 g/dL 10.6(L) 10.9(L) 10.7(L)  Hematocrit 39 -  52 % 31.4(L) 32.9(L) 32.7(L)  Platelets 150 - 400 K/uL 183 152 146(L)     CMP Latest Ref Rng & Units 10/04/2019 09/26/2019 09/19/2019  Glucose 70 - 99 mg/dL 107(H) 131(H) 118(H)  BUN 6 - 20 mg/dL 10 5(L) 6  Creatinine 0.61 - 1.24 mg/dL 0.62 0.65 0.63  Sodium 135 - 145 mmol/L 134(L) 135 135  Potassium 3.5 - 5.1 mmol/L 3.6 3.7 3.8  Chloride 98 - 111 mmol/L 102 102 101  CO2 22 - 32 mmol/L _0 Calcium 8.9 - 10.3 mg/dL 9.1 9.7 9.1  Total Protein 6.5 - 8.1 g/dL 6.7 6.7 6.7  Total Bilirubin 0.3 - 1.2 mg/dL 0.6 0.5 0.3  Alkaline Phos 38 - 126 U/L 55 56 64  AST 15 - 41 U/L 22 12(L) 12(L)  ALT 0 - 44 U/L _1 RADIOGRAPHIC STUDIES: I have personally reviewed the radiological images as listed and agreed with the findings in the report. No results found.   ASSESSMENT & PLAN: Coren Sagan Hernandezis a 44 y.o.malewith   1.[name], cTxN1M0 -he presented with6 months of  intermittenthematemesisand progressive Dysphagia and Upper abdominal pain.08/11/19 CT CAP found him to havemidesophageal mass and enlarged AP window node, no distant metastasis. EGD on 7/15/21biopsyshowed Squamous Cell Carcinoma.PET scan not done due to self-pay -Began neoadjuvantccRTper Dr Lisbeth Renshaw andcarbo/Taxol on 08/29/2019. Goal is curative -Treatment plan includes esophagectomy and adjuvant immunotherapy to follow;will refer to cardiothoracic surgery before he completes radiation -s/p week 5, tolerating well overall with increased dysphagia/odynophagia which is likely related to RT but stable from last week.  2. 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  -pain is non-exertional. EKG on 8/30 shows sinus tachy otherwise unremarkable.  -this is likely related to radiation esophagitis, continue carafate and pantoprazole BID. Hycet increased on week 5 with plans to decrease quickly with adequate pain  control -stable, denies chest pain today. Dysphagia without significant odynophagia is stable  3. Exertional dyspnea, chills, tachycardia with week 5 chemo  -ID work up negative  -resolved  4.Moderate anemia secondary to GI bleeding and probable iron deficiency -He has hadrecurrentNausea and vomiting withhematemesissince 01/2019.  -He required blood transfusion on 08/15/19 and IV Feraheme on 08/12/19. -Continue oral iron. -Hgb stable on chemo  5.Heavy alcohol user -Previously counseled on alcohol cessation  6. Dysphagia, Nutrition, Weight Loss, Abdominal pain  -He reports progressive dysphagia with solid food for the past months, he is currently on soft diet.  -He is still tolerating oral soft diet but with more dysphagia and odynophagia, he has lost 5 lbs since last week   -Pain managed on Hycet every 6 hours as needed. I have increased the dose for now, plan to ween as quickly as possible as appropriate  7. Social and Acupuncturist -Patient does not have insurance.He is also undocumented. -Met withfinancial advocate Shauna and SWto discuss grant/assistance  Disposition: [name] appears stable.  He has completed 5 weeks of ccRT with dose-reduced Taxol and carboplatin.  He tolerates treatment moderately well.  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  He developed n/v after IVF and ingesting too much liquid volume which has resolved. We reviewed symptom management. He has stable dysphagia without significant odynophagia, managed with Carafate and Hycet.  Weight fluctuates on chemo, down only 3 pounds from pretreatment weight.  He is otherwise able to recover and function well.  We reviewed the CBC and CMP from today, ANC 1.6; otherwise stable.  We discussed the option to stop chemo due to mild neuropathy and recent N/V versus proceed with final cycle 6, patient strongly desires to proceed with last chemo.  He understands the risks of recurrent N/V  and progressive neuropathy.  Will follow closely.  He plans to complete RT on 9/9.  I have referred him to cardiothoracic surgery.  We will see him back in 2 weeks to monitor labs and his recovery from treatment.  Anticipate repeating scans in 4-6 weeks from today, will order at next visit.  The patient's sister is concerned that he will not continue to abstain from alcohol once his chemoradiation is complete.  I reviewed the risk of continuing to drink alcohol and the benefit of cessation and encouraged him to quit completely.  The patient does not think he needs help to quit, his sister disagrees. She is interested in support resources for her/the family and therefore I referred to social work today.   Orders Placed This Encounter  Procedures  . Ambulatory referral to Cardiothoracic Surgery    Referral Priority:   Routine    Referral Type:  Surgical    Referral Reason:   Specialty Services Required    Requested Specialty:   Cardiothoracic Surgery    Number of Visits Requested:   1  . Ambulatory referral to Social Work    Referral Priority:   Routine    Referral Type:   Consultation    Referral Reason:   Specialty Services Required    Number of Visits Requested:   1   All questions were answered. The patient knows to call the clinic with any problems, questions or concerns. No barriers to learning were detected with Spanish speaking interpreter presents. Total encounter time was 25 minutes.     Alla Feeling, NP 10/04/19

## 2019-10-04 ENCOUNTER — Ambulatory Visit
Admission: RE | Admit: 2019-10-04 | Discharge: 2019-10-04 | Disposition: A | Discharge status: Home / Self Care | Payer: Self-pay | Source: Ambulatory Visit | Attending: Radiation Oncology | Admitting: Radiation Oncology

## 2019-10-04 ENCOUNTER — Inpatient Hospital Stay (HOSPITAL_BASED_OUTPATIENT_CLINIC_OR_DEPARTMENT_OTHER): Payer: Self-pay | Admitting: Nurse Practitioner

## 2019-10-04 ENCOUNTER — Other Ambulatory Visit: Payer: Self-pay

## 2019-10-04 ENCOUNTER — Inpatient Hospital Stay: Payer: Self-pay

## 2019-10-04 ENCOUNTER — Encounter: Payer: Self-pay | Admitting: Nurse Practitioner

## 2019-10-04 VITALS — BP 115/73 | HR 92 | Temp 96.6°F | Resp 20 | Ht 66.0 in | Wt 160.6 lb

## 2019-10-04 DIAGNOSIS — C154 Malignant neoplasm of middle third of esophagus: Secondary | ICD-10-CM

## 2019-10-04 LAB — CMP (CANCER CENTER ONLY)
ALT: 12 U/L (ref 0–44)
AST: 22 U/L (ref 15–41)
Albumin: 3.1 g/dL — ABNORMAL LOW (ref 3.5–5.0)
Alkaline Phosphatase: 55 U/L (ref 38–126)
Anion gap: 7 (ref 5–15)
BUN: 10 mg/dL (ref 6–20)
CO2: 25 mmol/L (ref 22–32)
Calcium: 9.1 mg/dL (ref 8.9–10.3)
Chloride: 102 mmol/L (ref 98–111)
Creatinine: 0.62 mg/dL (ref 0.61–1.24)
GFR, Est AFR Am: >60 mL/min (ref >60)
GFR, Estimated: >60 mL/min (ref >60)
Glucose, Bld: 107 mg/dL — ABNORMAL HIGH (ref 70–99)
Potassium: 3.6 mmol/L (ref 3.5–5.1)
Sodium: 134 mmol/L — ABNORMAL LOW (ref 135–145)
Total Bilirubin: 0.6 mg/dL (ref 0.3–1.2)
Total Protein: 6.7 g/dL (ref 6.5–8.1)

## 2019-10-04 LAB — CBC WITH DIFFERENTIAL (CANCER CENTER ONLY)
Abs Immature Granulocytes: 0.03 10*3/uL (ref 0.00–0.07)
Basophils Absolute: 0 10*3/uL (ref 0.0–0.1)
Basophils Relative: 0 %
Eosinophils Absolute: 0 10*3/uL (ref 0.0–0.5)
Eosinophils Relative: 1 %
HCT: 31.4 % — ABNORMAL LOW (ref 39.0–52.0)
Hemoglobin: 10.6 g/dL — ABNORMAL LOW (ref 13.0–17.0)
Immature Granulocytes: 1 %
Lymphocytes Relative: 4 %
Lymphs Abs: 0.1 10*3/uL — ABNORMAL LOW (ref 0.7–4.0)
MCH: 28.3 pg (ref 26.0–34.0)
MCHC: 33.8 g/dL (ref 30.0–36.0)
MCV: 83.7 fL (ref 80.0–100.0)
Monocytes Absolute: 0.6 10*3/uL (ref 0.1–1.0)
Monocytes Relative: 25 %
Neutro Abs: 1.6 10*3/uL — ABNORMAL LOW (ref 1.7–7.7)
Neutrophils Relative %: 69 %
Platelet Count: 183 10*3/uL (ref 150–400)
RBC: 3.75 MIL/uL — ABNORMAL LOW (ref 4.22–5.81)
RDW: 18.6 % — ABNORMAL HIGH (ref 11.5–15.5)
WBC Count: 2.4 10*3/uL — ABNORMAL LOW (ref 4.0–10.5)
nRBC: 0 % (ref 0.0–0.2)

## 2019-10-04 MED ORDER — FAMOTIDINE IN NACL 20-0.9 MG/50ML-% IV SOLN
INTRAVENOUS | Status: AC
  Filled 2019-10-04: qty 50

## 2019-10-04 MED ORDER — FAMOTIDINE IN NACL 20-0.9 MG/50ML-% IV SOLN
20.0000 mg | Freq: Once | INTRAVENOUS | Status: AC
  Administered 2019-10-04: 20 mg via INTRAVENOUS

## 2019-10-04 MED ORDER — DIPHENHYDRAMINE HCL 50 MG/ML IJ SOLN
INTRAMUSCULAR | Status: AC
  Filled 2019-10-04: qty 1

## 2019-10-04 MED ORDER — SODIUM CHLORIDE 0.9 % IV SOLN
Freq: Once | INTRAVENOUS | Status: AC
  Filled 2019-10-04: qty 250

## 2019-10-04 MED ORDER — PALONOSETRON HCL INJECTION 0.25 MG/5ML
0.2500 mg | Freq: Once | INTRAVENOUS | Status: AC
  Administered 2019-10-04: 0.25 mg via INTRAVENOUS

## 2019-10-04 MED ORDER — SODIUM CHLORIDE 0.9 % IV SOLN
225.0000 mg | Freq: Once | INTRAVENOUS | Status: AC
  Administered 2019-10-04: 230 mg via INTRAVENOUS
  Filled 2019-10-04: qty 23

## 2019-10-04 MED ORDER — SODIUM CHLORIDE 0.9 % IV SOLN
40.0000 mg/m2 | Freq: Once | INTRAVENOUS | Status: AC
  Administered 2019-10-04: 78 mg via INTRAVENOUS
  Filled 2019-10-04: qty 13

## 2019-10-04 MED ORDER — SODIUM CHLORIDE 0.9 % IV SOLN
10.0000 mg | Freq: Once | INTRAVENOUS | Status: AC
  Administered 2019-10-04: 10 mg via INTRAVENOUS
  Filled 2019-10-04: qty 10

## 2019-10-04 MED ORDER — PALONOSETRON HCL INJECTION 0.25 MG/5ML
INTRAVENOUS | Status: AC
  Filled 2019-10-04: qty 5

## 2019-10-04 MED ORDER — DIPHENHYDRAMINE HCL 50 MG/ML IJ SOLN
50.0000 mg | Freq: Once | INTRAMUSCULAR | Status: AC
  Administered 2019-10-04: 50 mg via INTRAVENOUS

## 2019-10-04 NOTE — Progress Notes (Signed)
Nutrition Follow-up:  Patient with esophageal cancer and receiving concurrent chemotherapy and radiation therapy.    Met with patient and interpreter, Madilyn Hook in infusion.  Patient reports that appetite is good but having trouble swallowing (radiation esophagitis).  Eating soft foods like soups, rice, chicken.  Drinking 2 ensure per day.      Medications: hycet, carafate, prilosec  Labs: reviewed  Anthropometrics:   Weight 160 lb 9/6 oz today decreased from 167 lb on 8/23   NUTRITION DIAGNOSIS: Increased nutrient needs continue   INTERVENTION:  Encouraged patient to increase ensure to TID for added calories and protein. Coupons given. Encouraged patient to chop foods well and add moisture (liquid, gravy, sauce to foods) to make them easier to swallow and provide calories.    MONITORING, EVALUATION, GOAL: weight trends, intake  NEXT VISIT: as needed, last treatment is on 9/9  Panayiotis [place] B. Zenia Resides, [place], [place] Registered Dietitian [phone] (mobile)

## 2019-10-04 NOTE — Patient Instructions (Signed)
[facility] Discharge Instructions for Patients Receiving Chemotherapy  Today you received the following chemotherapy agents: paclitaxel and carboplatin.  To help prevent nausea and vomiting after your treatment, we encourage you to take your nausea medication as directed.   If you develop nausea and vomiting that is not controlled by your nausea medication, call the clinic.   BELOW ARE SYMPTOMS THAT SHOULD BE REPORTED IMMEDIATELY:  *FEVER GREATER THAN 100.5 F  *CHILLS WITH OR WITHOUT FEVER  NAUSEA AND VOMITING THAT IS NOT CONTROLLED WITH YOUR NAUSEA MEDICATION  *UNUSUAL SHORTNESS OF BREATH  *UNUSUAL BRUISING OR BLEEDING  TENDERNESS IN MOUTH AND THROAT WITH OR WITHOUT PRESENCE OF ULCERS  *URINARY PROBLEMS  *BOWEL PROBLEMS  UNUSUAL RASH Items with * indicate a potential emergency and should be followed up as soon as possible.  Feel free to call the clinic should you have any questions or concerns. The clinic phone number is [phone].  Please show the CHEMO ALERT CARD at check-in to the Emergency Department and triage nurse.   

## 2019-10-05 ENCOUNTER — Encounter: Payer: Self-pay | Admitting: *Deleted

## 2019-10-05 ENCOUNTER — Ambulatory Visit
Admission: RE | Admit: 2019-10-05 | Discharge: 2019-10-05 | Disposition: A | Discharge status: Home / Self Care | Payer: Self-pay | Source: Ambulatory Visit | Attending: Radiation Oncology | Admitting: Radiation Oncology

## 2019-10-05 ENCOUNTER — Telehealth: Payer: Self-pay | Admitting: Nurse Practitioner

## 2019-10-05 NOTE — Telephone Encounter (Signed)
Scheduled per 9/7 los. Unable to reach phone interpreter. Line keeps cutting off. Reached out to [employer] and will give pt a call with appt times and date.

## 2019-10-05 NOTE — Progress Notes (Signed)
[place] Work  Clinical Social Work received referral for alcohol cessation and family support resources.  CSW worked with [facility] interpreter to contact patient.  Patient reported he was drinking and realized it was becoming a problem, and has now stopped.  Patient has had this conversation with his family.  Patient stated he was very appreciative of the contact and would contact CSW/interpreter if he needed additional resources or support.   Johnnye Lana, MSW, LCSW, OSW-C Clinical Social Worker [facility] [phone]

## 2019-10-06 ENCOUNTER — Ambulatory Visit
Admission: RE | Admit: 2019-10-06 | Discharge: 2019-10-06 | Disposition: A | Discharge status: Home / Self Care | Payer: Self-pay | Source: Ambulatory Visit | Attending: Radiation Oncology | Admitting: Radiation Oncology

## 2019-10-06 ENCOUNTER — Other Ambulatory Visit: Payer: Self-pay | Admitting: Radiation Oncology

## 2019-10-06 ENCOUNTER — Encounter: Payer: Self-pay | Admitting: Radiation Oncology

## 2019-10-06 MED ORDER — HYDROCODONE-ACETAMINOPHEN 7.5-325 MG/15ML PO SOLN
15.0000 mL | Freq: Four times a day (QID) | ORAL | 0 refills | Status: DC | PRN
Start: 2019-10-06 — End: 2019-10-20

## 2019-10-06 MED FILL — HYDROCOD-APAP 7.5-325/15ML: 7.5-325 | 7 days supply | Qty: 473 | Fill #0

## 2019-10-14 ENCOUNTER — Encounter: Payer: Self-pay | Admitting: Thoracic Surgery (Cardiothoracic Vascular Surgery)

## 2019-10-14 ENCOUNTER — Institutional Professional Consult (permissible substitution) (INDEPENDENT_AMBULATORY_CARE_PROVIDER_SITE_OTHER): Payer: Self-pay | Admitting: Thoracic Surgery (Cardiothoracic Vascular Surgery)

## 2019-10-14 ENCOUNTER — Other Ambulatory Visit: Payer: Self-pay

## 2019-10-14 ENCOUNTER — Other Ambulatory Visit: Payer: Self-pay | Admitting: *Deleted

## 2019-10-14 VITALS — BP 108/72 | HR 96 | Temp 97.7°F | Resp 20 | Ht 66.0 in | Wt 160.0 lb

## 2019-10-14 DIAGNOSIS — C159 Malignant neoplasm of esophagus, unspecified: Secondary | ICD-10-CM

## 2019-10-14 DIAGNOSIS — C154 Malignant neoplasm of middle third of esophagus: Secondary | ICD-10-CM

## 2019-10-14 NOTE — Progress Notes (Signed)
[address]       Hawkins,Jimmy Hawkins             [phone]                    Jimmy Hawkins [place] Medical Record #[id number] Date of Birth: [birth year]  Referring: Alla Feeling, NP Primary Care: [name], MD Primary Cardiologist: No primary care provider on file.  Chief Complaint:    Chief Complaint  Patient presents with  . Esophageal Cancer    Surgical consult, Chest CT 08/11/19, EGD 08/11/19    History of Present Illness:    Jimmy Hawkins 44 y.o. male presents for surgical evaluation of a squamous cell cancer of the upper esophagus.  He was having significant dysphagia and odynophagia earlier this summer, and underwent an EGD which identified a large partially obstructing esophageal mass at approximately 30 cm from the incisors.  Biopsies were consistent with squamous cell cancer.  He subsequently underwent a CT scan which showed this large tumor that is very close to the carina and partially encircling of the right and left mainstem.  He denies any respiratory symptoms except for some episodes of coughing when drinking water.    He recently completed his neoadjuvant chemotherapy and radiation on September 9, and comes in to discuss surgical options.  He lost about 5 pounds during the course of his neoadjuvant therapy.       Zubrod Score: At the time of surgery this patient's most appropriate activity status/level should be described as: []     0    Normal activity, no symptoms []     1    Restricted in physical strenuous activity but ambulatory, able to do out light work []     2    Ambulatory and capable of self care, unable to do work activities, up and about               >50 % of waking hours                              []     3    Only limited self care, in bed greater than 50% of waking hours []     4    Completely disabled, no self care, confined to bed or chair []     5    Moribund   Past Medical History:    Diagnosis Date  . GERD (gastroesophageal reflux disease)   . Hematemesis with nausea 08/10/2019    Past Surgical History:  Procedure Laterality Date  . BIOPSY  08/11/2019   Procedure: BIOPSY;  Surgeon: [name], MD;  Location: [facility] ENDOSCOPY;  Service: Endoscopy;;  . ESOPHAGOGASTRODUODENOSCOPY  08/11/2019  . ESOPHAGOGASTRODUODENOSCOPY (EGD) WITH PROPOFOL N/A 08/11/2019   Procedure: ESOPHAGOGASTRODUODENOSCOPY (EGD) WITH PROPOFOL;  Surgeon: [name], MD;  Location: [place];  Service: Endoscopy;  Laterality: N/A;    Family History  Problem Relation Age of Onset  . Stomach cancer Mother   . Healthy Father      Social History   Tobacco Use  Smoking Status Never Smoker  Smokeless Tobacco Never Used    Social History   Substance and Sexual Activity  Alcohol Use Not Currently  . Alcohol/week: 6.0 standard drinks  . Types: 6 Cans of beer per week   Comment:  6 drinks/day     No Known Allergies  Current Outpatient Medications  Medication Sig Dispense Refill  . Ferrous Sulfate (IRON) 325 (65 Fe) MG TABS Take 1 tablet (325 mg total) by mouth 2 (two) times daily. 30 tablet 1  . HYDROcodone-acetaminophen (HYCET) 7.5-325 mg/15 ml solution Take 15 mLs by mouth every 6 (six) hours as needed for moderate pain. 473 mL 0  . omeprazole (PRILOSEC) 40 MG capsule Take 1 capsule (40 mg total) by mouth daily. 30 capsule 2  . ondansetron (ZOFRAN) 8 MG tablet Take 1 tablet (8 mg total) by mouth 2 (two) times daily as needed for refractory nausea / vomiting. Start on day 3 after chemo. 30 tablet 1  . pantoprazole (PROTONIX) 40 MG tablet Take 1 tablet (40 mg total) by mouth 2 (two) times daily. 60 tablet 1  . prochlorperazine (COMPAZINE) 10 MG tablet Take 1 tablet (10 mg total) by mouth every 6 (six) hours as needed (Nausea or vomiting). 30 tablet 1  . sucralfate (CARAFATE) 1 g tablet Take 1 tablet (1 g total) by mouth 4 (four) times daily. Dissolve each tablet in 15 cc water before  use. 120 tablet 2   No current facility-administered medications for this visit.    Review of Systems  Constitutional: Positive for malaise/fatigue and weight loss.  Respiratory: Positive for cough. Negative for shortness of breath.   Cardiovascular: Positive for chest pain.     PHYSICAL EXAMINATION: Ht 5\' 6"  (1.676 m)   BMI 25.92 kg/m  Physical Exam Constitutional:      General: He is not in acute distress.    Appearance: Normal appearance. He is normal weight. He is not ill-appearing.  HENT:     Head: Normocephalic and atraumatic.  Eyes:     Extraocular Movements: Extraocular movements intact.     Conjunctiva/sclera: Conjunctivae normal.  Cardiovascular:     Rate and Rhythm: Normal rate.  Pulmonary:     Effort: Pulmonary effort is normal. No respiratory distress.  Abdominal:     General: Abdomen is flat.     Palpations: Abdomen is soft.  Musculoskeletal:        General: Normal range of motion.     Cervical back: Normal range of motion.  Skin:    General: Skin is warm and dry.  Neurological:     General: No focal deficit present.     Mental Status: He is alert and oriented to person, place, and time.     Diagnostic Studies & Laboratory data:    CT Scan: 08/11/2019 Fusiform masslike appearance primarily along the lumen of the mid to distal esophagus along an 8 cm segment, the esophagus including this central masslike component measures 6.5 by 3.4 cm in cross-section on image 35/4, strongly favoring esophageal malignancy. There is wall thickening of the esophagus just distal to this mass with an air-fluid level. Indistinctness of esophageal wall margins particularly along the left side in the vicinity of the mass.  AP window lymph node 1.0 cm in short axis, image 24/4. Right eccentric subcarinal node 0.6 cm in short axis, image 32/4. Right upper paratracheal node 0.6 cm in short axis, image 14/4. Aside from the borderline enlarged AP window lymph node, no  overtly pathologically enlarged thoracic lymph nodes.  This is his pulmonary intake thanks  Yes yes and then you now if everything looks good we can start advancing his diet he has is mostly for comfort all of his nutrition should be coming from the nutritionist decrease his tube feeds and I need to talk all irrigate  all of his calories tube feeds and thin liquids like is not there is not any protein is not nearly enough protein calorie clear liquid diet so that consult comfort care yes-65 is probably be 24/7 with the pain medication because needing more refills  PET/CT: no pre-op PET EGD/EUS: 08/11/19 A large, friable, firm mass with bleeding was found in the upper third of the esophagus and in the middle third of the esophagus, 30 cm from the incisors. The mass was partially obstructing.    Upper 1/3  Path: squamous cell cancer Radiation Hx: start 8/2 completed on 9/9.       I have independently reviewed the above radiology studies  and reviewed the findings with the patient.   Recent Lab Findings: Lab Results  Component Value Date   WBC 2.4 (L) 10/04/2019   HGB 10.6 (L) 10/04/2019   HCT 31.4 (L) 10/04/2019   PLT 183 10/04/2019   GLUCOSE 107 (H) 10/04/2019   ALT 12 10/04/2019   AST 22 10/04/2019   NA 134 (L) 10/04/2019   K 3.6 10/04/2019   CL 102 10/04/2019   CREATININE 0.62 10/04/2019   BUN 10 10/04/2019   CO2 25 10/04/2019   TSH 0.357 08/11/2019   INR 1.2 08/10/2019       Problem List: Upper 1/3 esophageal squamous cell cancer.  Concern for tracheal/carina invasion No pre-op PET CT   Assessment / Plan:   44 yo male with hx of EtOH abuse, now with a proximal Squamous cell Carcinoma of the esophagus.  TxN1Mx.  No pre-op PET/CT was performed prior to neoadjuvant therapy.  He completed his radiation on 9/9.  Cross-sectional imaging, the mass is very close to the carina right mainstem bronchus.  He will need a formal bronchoscopy for evaluation and potentially biopsies  of the carina and bilateral mainstem to ensure that there is no tumor invasion.  This will be difficult to determine given the fact that he completed his neoadjuvant chemotherapy and radiation.  It is possible that he had a good response in these biopsies are potentially negative will be unclear to determine whether or not there is actually tumor of his airway.  He will also need PET/CT at 4 weeks from today for further evaluation.    I am not optimistic that he will be a good surgical candidate, given that there does not appear to be a defined plane between the right and left main bronchus and the mass.  He did not have an EUS to determine if there was tumor invasion into the bronchus and this would have been very beneficial for surgical planning.  The other option would have been to obtain a bronchoscopy prior to his neoadjuvant therapy.  I explained all this to them and I will touch base with gastroenterology to determine if obtaining an EUS would be helpful at this point.  I will schedule him for formal bronchoscopy as well.  I will see him back in 4 weeks with his PET/CT.     I  spent 40 minutes with  the patient face to face and greater then 50% of the time was spent in counseling and coordination of care.    [name] 10/14/2019 10:06 AM

## 2019-10-16 NOTE — Progress Notes (Signed)
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 Mid to lower esophageal mass along an 8 cm segment, large endoluminal component, strongly favoring esophageal malignancy. Borderline enlarged AP window lymph node. No findings of distant metastatic disease. 2. Trace bilateral pleural effusions. 3. Diffuse hepatic steatosis. 4. Cholelithiasis. 5. Fatty spermatic cords likely due to small bilateral indirect inguinal hernias.   08/11/2019 Procedure   EGD by Dr Carlean Purl  IMPRESSION - Partially obstructing, likely malignant esophageal tumor was found in the upper third of the esophagus and in the middle third of the esophagus. Biopsied. Very large and long - difficult but able to advance scope by this lesion - await CT for better length estimate - Normal stomach. - Normal examined duodenum.   08/11/2019 Initial Biopsy   FINAL MICROSCOPIC DIAGNOSIS:   A. ESOPHAGUS, UPPER, BIOPSY:  - Squamous cell carcinoma.    08/18/2019 Cancer Staging   Staging form: Esophagus - Squamous Cell Carcinoma, AJCC 8th Edition - Clinical: Stage Unknown (cTX, cN1, cM0) - Signed by [name], MD on 08/18/2019   08/29/2019 -  Chemotherapy    Concurrent chemoradiation with weekly CT for 6 weeks starting 08/29/19    08/29/2019 -  Radiation Therapy   Concurrent chemoradiation with Dr Lisbeth Renshaw starting 08/29/19     CURRENT THERAPY: completed ccRT on 9/9, last chemo 9/7   INTERVAL HISTORY: [name] returns with his sister and Spanish interpreter for f/u as scheduled. He completed last carbo/taxol on 9/7 and RT on 9/9. He was seen by Dr. Kipp Brood on 10/14/19 who is recommending bronchoscopy and PET scan prior to making final surgical decision.  The first week after completing treatment was "bad" with no appetite, sore throat, and epigastric pain.  For the past 4-5 days he felt fine, epigastric pain worse at night.  He continues Hycet morning and night which is helpful.  He is able to eat a little more each day still liquid to soft diet.  He has mild nausea no vomiting.  Denies any neuropathy, no recent fever, chills, cough, chest pain, dyspnea.  He continues to abstain from alcohol but this is difficult for him because he feels bored and trapped at home.  His second Covid vaccine was 09/14/2019.    MEDICAL HISTORY:  Past Medical History:  Diagnosis Date  . GERD (gastroesophageal reflux disease)   . Hematemesis with nausea 08/10/2019    SURGICAL HISTORY: Past Surgical History:  Procedure Laterality Date  . BIOPSY  08/11/2019   Procedure: BIOPSY;  Surgeon: [name], MD;  Location: [facility] ENDOSCOPY;  Service: Endoscopy;;  . ESOPHAGOGASTRODUODENOSCOPY  08/11/2019  . ESOPHAGOGASTRODUODENOSCOPY (EGD) WITH PROPOFOL N/A 08/11/2019   Procedure: ESOPHAGOGASTRODUODENOSCOPY (EGD) WITH PROPOFOL;  Surgeon: [name], MD;  Location: [facility]  ENDOSCOPY;  Service: Endoscopy;  Laterality: N/A;    I have reviewed the social history and family history with the patient and they are unchanged from previous note.  ALLERGIES:  has No Known Allergies.  MEDICATIONS:  Current Outpatient Medications  Medication Sig Dispense Refill  . Ferrous Sulfate  (IRON) 325 (65 Fe) MG TABS Take 1 tablet (325 mg total) by mouth 2 (two) times daily. 30 tablet 1  . HYDROcodone-acetaminophen (HYCET) 7.5-325 mg/15 ml solution Take 15 mLs by mouth every 6 (six) hours as needed for moderate pain. 473 mL 0  . omeprazole (PRILOSEC) 40 MG capsule Take 1 capsule (40 mg total) by mouth daily. 30 capsule 2  . ondansetron (ZOFRAN) 8 MG tablet Take 1 tablet (8 mg total) by mouth 2 (two) times daily as needed for refractory nausea / vomiting. Start on day 3 after chemo. 30 tablet 1  . pantoprazole (PROTONIX) 40 MG tablet Take 1 tablet (40 mg total) by mouth 2 (two) times daily. 60 tablet 1  . prochlorperazine (COMPAZINE) 10 MG tablet Take 1 tablet (10 mg total) by mouth every 6 (six) hours as needed (Nausea or vomiting). 30 tablet 1  . sucralfate (CARAFATE) 1 g tablet Take 1 tablet (1 g total) by mouth 4 (four) times daily. Dissolve each tablet in 15 cc water before use. 120 tablet 2   No current facility-administered medications for this visit.    PHYSICAL EXAMINATION: ECOG PERFORMANCE STATUS: 1 - Symptomatic but completely ambulatory  Vitals:   10/17/19 1509  BP: 107/72  Pulse: 85  Resp: 18  Temp: 97.9 F (36.6 C)  SpO2: 100%   Filed Weights   10/17/19 1509  Weight: 161 lb 4.8 oz (73.2 kg)    GENERAL:alert, no distress and comfortable SKIN: No rash or erythema.  Mild hyperpigmentation to the mid upper back EYES:  sclera clear LUNGS: clear with normal breathing effort HEART: regular rate & rhythm, no lower extremity edema ABDOMEN:abdomen soft, non-tender and normal bowel sounds NEURO: alert & oriented x 3 with fluent speech  LABORATORY DATA:  I have reviewed the data as listed CBC Latest Ref Rng & Units 10/17/2019 10/04/2019 09/26/2019  WBC 4.0 - 10.5 K/uL 2.3(L) 2.4(L) 2.0(L)  Hemoglobin 13.0 - 17.0 g/dL 10.6(L) 10.6(L) 10.9(L)  Hematocrit 39 - 52 % 31.5(L) 31.4(L) 32.9(L)  Platelets 150 - 400 K/uL 187 183 152     CMP Latest Ref Rng & Units  10/17/2019 10/04/2019 09/26/2019  Glucose 70 - 99 mg/dL 100(H) 107(H) 131(H)  BUN 6 - 20 mg/dL <4(L) 10 5(L)  Creatinine 0.61 - 1.24 mg/dL 0.67 0.62 0.65  Sodium 135 - 145 mmol/L 135 134(L) 135  Potassium 3.5 - 5.1 mmol/L 3.9 3.6 3.7  Chloride 98 - 111 mmol/L 104 102 102  CO2 22 - 32 mmol/L '25 25 25  ' Calcium 8.9 - 10.3 mg/dL 8.9 9.1 9.7  Total Protein 6.5 - 8.1 g/dL 6.5 6.7 6.7  Total Bilirubin 0.3 - 1.2 mg/dL 0.4 0.6 0.5  Alkaline Phos 38 - 126 U/L 74 55 56  AST 15 - 41 U/L 18 22 12(L)  ALT 0 - 44 U/L '11 12 8      ' RADIOGRAPHIC STUDIES: I have personally reviewed the radiological images as listed and agreed with the findings in the report. No results found.   ASSESSMENT & PLAN: Jimmy Hawkins a 44 y.o.malewith   1.[name], cTxN1M0 -he presented with6 months of intermittenthematemesisand progressive Dysphagia and Upper abdominal pain.08/11/19 CT CAP found  him to havemidesophageal mass and enlarged AP window node, no distant metastasis. EGD on 7/15/21biopsyshowed Squamous Cell Carcinoma.PET scan not done due to self-pay -Completed neoadjuvantccRTper Dr Lisbeth Renshaw andcarbo/Taxol on 08/29/2019 - 10/06/19. Goal is curative. He tolerated well with increased odynophagia and mild cold sensitivity/neuropathy -he is pending further work up to determine if he is a candidate for esophagectomy. If he proceeds with surgery, the plan includes adjuvant immunotherapy. He was seen by Dr. Kipp Brood on 10/14/19.  -if no surgery, we would likely offer additional chemo and/or immunotherapy in the future   2. 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  -pain is non-exertional. EKG on 8/30 shows sinus tachy otherwise unremarkable.  -this is likely related to radiation esophagitis, continue carafate and pantoprazole BID. Hycet increased on week 5 with plans to decrease quickly with adequate pain  control -stable, denies chest pain today. Dysphagia without significant odynophagia is stable -improving after ccRT, encouraged to wean hycet to once daily at night then stop altogether in a couple weeks   3. Exertional dyspnea, chills, tachycardia with week 5 chemo  -ID work up negative  -resolved  4.Moderate anemia secondary to GI bleeding and probable iron deficiency -He has hadrecurrentNausea and vomiting withhematemesissince 01/2019.  -He required blood transfusion on 08/15/19 and IV Feraheme on 08/12/19. -Continue oral iron. -Hgb stableon chemo  5.Heavy alcohol user -Previously counseled on alcohol cessation, he continues to abstain.  -I have discussed this with him and offered SW resources, he declined in the past   6. Dysphagia, Nutrition, Weight Loss, Abdominal pain  -He reports progressive dysphagia with solid food for the past months, he is currently on soft diet.  -He is still tolerating oral soft dietbut with more dysphagia and odynophagia, he has lost 5 lbs since last week -Pain managed on Hycet every 6 hours as needed, dose increased after week 5.  -pain controlled on hycet BID PRN, encouraged to wean now that pain has improved post treatment   7. Social and Acupuncturist -Patient does not have insurance.He is also undocumented. -Met withfinancial advocate Shauna and SWto discuss grant/assistance  Disposition: Jimmy Hawkins appears stable.  He completed ccRT with [place] and Taxol on 10/06/2019.  He tolerated treatment well.  He has mild residual nausea and odynophagia.  Symptoms are well managed with supportive meds at home.  I encouraged him to try to wean down on Hycet with the goal to eventually discontinue altogether in the next couple weeks.  He agrees. He is gaining weight, tolerating soft diet.   He is pending further work-up with PET scan and bronchoscopy before a final surgical decision is made.  He will see Dr. Kipp Brood after PET scan  on 11/11/19. If he proceeds with surgery we will recommend adjuvant immunotherapy.  If he is not a surgical candidate his treatment plan will include additional chemo and/or immunotherapy in the future.  We will see him after work-up and next follow-up with Dr. Kipp Brood.  We reviewed the CBC and CMP from today which shows mild anemia and neutropenia from recent chemo. ANC 0.9. We reviewed precautions. I recommend the COVID 19 booster which will be scheduled this week.   Lab and f/u after work up and next surgery f/u in 4 weeks   All questions were answered. The patient knows to call the clinic with any problems, questions or concerns. No barriers to learning was detected.     Alla Feeling, NP 10/17/19

## 2019-10-17 ENCOUNTER — Inpatient Hospital Stay: Payer: Self-pay

## 2019-10-17 ENCOUNTER — Encounter: Payer: Self-pay | Admitting: Nurse Practitioner

## 2019-10-17 ENCOUNTER — Inpatient Hospital Stay (HOSPITAL_BASED_OUTPATIENT_CLINIC_OR_DEPARTMENT_OTHER): Payer: Self-pay | Admitting: Nurse Practitioner

## 2019-10-17 ENCOUNTER — Other Ambulatory Visit: Payer: Self-pay

## 2019-10-17 VITALS — BP 107/72 | HR 85 | Temp 97.9°F | Resp 18 | Ht 66.0 in | Wt 161.3 lb

## 2019-10-17 DIAGNOSIS — C154 Malignant neoplasm of middle third of esophagus: Secondary | ICD-10-CM

## 2019-10-17 LAB — CBC WITH DIFFERENTIAL (CANCER CENTER ONLY)
Abs Immature Granulocytes: 0.04 10*3/uL (ref 0.00–0.07)
Basophils Absolute: 0 10*3/uL (ref 0.0–0.1)
Basophils Relative: 0 %
Eosinophils Absolute: 0 10*3/uL (ref 0.0–0.5)
Eosinophils Relative: 2 %
HCT: 31.5 % — ABNORMAL LOW (ref 39.0–52.0)
Hemoglobin: 10.6 g/dL — ABNORMAL LOW (ref 13.0–17.0)
Immature Granulocytes: 2 %
Lymphocytes Relative: 23 %
Lymphs Abs: 0.5 10*3/uL — ABNORMAL LOW (ref 0.7–4.0)
MCH: 28.8 pg (ref 26.0–34.0)
MCHC: 33.7 g/dL (ref 30.0–36.0)
MCV: 85.6 fL (ref 80.0–100.0)
Monocytes Absolute: 0.7 10*3/uL (ref 0.1–1.0)
Monocytes Relative: 32 %
Neutro Abs: 0.9 10*3/uL — ABNORMAL LOW (ref 1.7–7.7)
Neutrophils Relative %: 41 %
Platelet Count: 187 10*3/uL (ref 150–400)
RBC: 3.68 MIL/uL — ABNORMAL LOW (ref 4.22–5.81)
RDW: 18.4 % — ABNORMAL HIGH (ref 11.5–15.5)
WBC Count: 2.3 10*3/uL — ABNORMAL LOW (ref 4.0–10.5)
nRBC: 0 % (ref 0.0–0.2)

## 2019-10-17 LAB — CMP (CANCER CENTER ONLY)
ALT: 11 U/L (ref 0–44)
AST: 18 U/L (ref 15–41)
Albumin: 3 g/dL — ABNORMAL LOW (ref 3.5–5.0)
Alkaline Phosphatase: 74 U/L (ref 38–126)
Anion gap: 6 (ref 5–15)
BUN: <4 mg/dL — ABNORMAL LOW (ref 6–20)
CO2: 25 mmol/L (ref 22–32)
Calcium: 8.9 mg/dL (ref 8.9–10.3)
Chloride: 104 mmol/L (ref 98–111)
Creatinine: 0.67 mg/dL (ref 0.61–1.24)
GFR, Est AFR Am: >60 mL/min (ref >60)
GFR, Estimated: >60 mL/min (ref >60)
Glucose, Bld: 100 mg/dL — ABNORMAL HIGH (ref 70–99)
Potassium: 3.9 mmol/L (ref 3.5–5.1)
Sodium: 135 mmol/L (ref 135–145)
Total Bilirubin: 0.4 mg/dL (ref 0.3–1.2)
Total Protein: 6.5 g/dL (ref 6.5–8.1)

## 2019-10-18 ENCOUNTER — Telehealth: Payer: Self-pay | Admitting: Hematology

## 2019-10-18 ENCOUNTER — Other Ambulatory Visit: Payer: Self-pay

## 2019-10-18 ENCOUNTER — Telehealth: Payer: Self-pay

## 2019-10-18 ENCOUNTER — Inpatient Hospital Stay: Payer: Self-pay

## 2019-10-18 DIAGNOSIS — Z23 Encounter for immunization: Secondary | ICD-10-CM

## 2019-10-18 DIAGNOSIS — K2289 Other specified disease of esophagus: Secondary | ICD-10-CM

## 2019-10-18 MED FILL — OMEPRAZOLE 40 MG CPDR: 40 | 30 days supply | Qty: 30 | Fill #1

## 2019-10-18 MED FILL — PROCHLORPERAZINE 10 MG TAB: 10 | 7 days supply | Qty: 30 | Fill #0

## 2019-10-18 NOTE — Progress Notes (Signed)
   Covid-19 Vaccination Clinic  Name:  Davarious Tumbleson    MRN: [id number] DOB: 1975/09/08  10/18/2019  [name] was observed post Covid-19 immunization for 15 minutes without incident. He was provided with Vaccine Information Sheet and instruction to access the V-Safe system.   [name] was instructed to call 911 with any severe reactions post vaccine: Marland Kitchen Difficulty breathing  . Swelling of face and throat  . A fast heartbeat  . A bad rash all over body  . Dizziness and weakness

## 2019-10-18 NOTE — Telephone Encounter (Signed)
-----   Message from [name], MD sent at 10/18/2019  9:32 AM EDT ----- HL, we would be happy to try and discern this if able since it has significant implications for surgical intervention or not.  Jimmy Hawkins, Please move forward with finding an EGD/EUS - Radial with DJ or myself. We need to have the Miniprobe available for the procedure. Please let us all know when the next available is in case we need to make any adjustments. Thank you. GM ----- Message ----- From: [name], MD Sent: 10/18/2019   8:10 AM EDT To: [name], MD  Hey,  I think so.  It may be hard to tell given his therapy, but if there is no defined plane then he will be unresectable.    Thanks,  H. ----- Message ----- From: [name], MD Sent: 10/17/2019   3:06 PM EDT To: [name], MD, [name], MD, #  Harrell, Certainly EUS may help distinguish some sense of the mass planes in regards to invasion through the MP or thereafter. When lesions are quite high in the esophagus however, there is air scatter artifact that can occur especially in regions near the trachea.  Certainly, we could attempt EUS but it will most likely need to be with a Mini-Probe since the normal EUS scopes are much larger than an adult endoscope. If you think you want [name] to give it a shot, if it will change your potential treatment algorithm, please let DJ and I know and we can work on finding an EUS timeslot. Thanks. GM ----- Message ----- From: [name], MD Sent: 10/14/2019   2:24 PM EDT To: [name], MD, #  I will defer to [facility] on this as I do not do EUS  Thanks to you both  [name] ----- Message ----- From: [name], MD Sent: 10/14/2019   1:49 PM EDT To: [name], MD, [name], MD  Good afternoon, Would you guys be able to take a look at this patient.  Dr. Carlean Hawkins, you originally did an EGD and diagnosed his proximal squamous  cell cancer.  The concern that I have is that on cross-sectional imaging, I do not see a defined plane between his esophagus and airway.  I am concerned that the left and right mainstem may be involved.  He is already been treated with neoadjuvant radiation unfortunately, but determining whether or not he has tumor invasion will be important in regards to his resectability.    Please let me know your thoughts. [name]

## 2019-10-18 NOTE — Telephone Encounter (Signed)
HL, This is our first available. This is too far out, please let me know and we can see what we can try and do to get something sooner. Thanks. GM

## 2019-10-18 NOTE — Telephone Encounter (Signed)
Scheduled per 9/20 los. Used Medical laboratory scientific officer. Unable to reach pt. Left voicemail with appt time and date.

## 2019-10-18 NOTE — Telephone Encounter (Signed)
The EGD EUS has been scheduled for EUS/EGD on 11/28/19 (first available) at 115 pm with Dr Rush Landmark at [facility].  FYI Dr Rush Landmark

## 2019-10-20 ENCOUNTER — Other Ambulatory Visit: Payer: Self-pay | Admitting: Hematology

## 2019-10-20 ENCOUNTER — Telehealth: Payer: Self-pay

## 2019-10-20 MED ORDER — HYDROCODONE-ACETAMINOPHEN 7.5-325 MG/15ML PO SOLN
15.0000 mL | Freq: Four times a day (QID) | ORAL | 0 refills | Status: DC | PRN
Start: 2019-10-20 — End: 2020-03-12

## 2019-10-20 MED FILL — HYDROCOD-APAP 7.5-325/15ML: 7.5-325 | 7 days supply | Qty: 473 | Fill #0

## 2019-10-20 NOTE — Telephone Encounter (Signed)
Mr Jimmy Hawkins stopped by and spoke with me.  He is requesting a refill for his hydrocodone liquid.  Please send to [facility].  Forwarded to Dr. Burr Medico

## 2019-10-21 ENCOUNTER — Ambulatory Visit (HOSPITAL_COMMUNITY): Payer: MEDICAID

## 2019-10-25 NOTE — Progress Notes (Signed)
°  Radiation Oncology         [phone]) [phone] ________________________________  Name: Jimmy Hawkins MRN: [id number]  Date: 10/06/2019  DOB: 17-Mar-1975  End of Treatment Note  Diagnosis:   esophageal cancer     Indication for treatment::  curative       Radiation treatment dates:   08/29/19 - 10/06/19  Site/dose:   The patient was treated to the esophagus to a dose of 45 Gy in 25 fractions using an IMRT technique. The patient then received a boost for an additional 5.4 Gy to yield a total dose of 50.4 Gy. An SIB technique was also used to treat the tumor to a dose of 50 Gy/6Gy for a total of 56 Gy total.  Narrative: The patient tolerated radiation treatment and completed the prescribed course.     Plan: The patient has completed radiation treatment. The patient will return to radiation oncology clinic for routine followup in one month. I advised the patient to call or return sooner if they have any questions or concerns related to their recovery or treatment. ________________________________  Jodelle Gross, M.D., Ph.D.

## 2019-10-31 ENCOUNTER — Telehealth: Payer: Self-pay | Admitting: Radiation Oncology

## 2019-10-31 NOTE — Telephone Encounter (Signed)
  Radiation Oncology         [phone]) [phone] ________________________________  Name: Jimmy Hawkins MRN: [id number]  Date of Service: 10/31/2019  DOB: 01-Sep-1975  Post Treatment Telephone Note  Diagnosis:  Squamous Cell Carcinoma of the Esophagus of the upper and mid esophagus.   Interval Since Last Radiation:  4 weeks   08/29/19 - 10/06/19: The patient was treated to the esophagus to a dose of 45 Gy in 25 fractions using an IMRT technique. The patient then received a boost for an additional 5.4 Gy to yield a total dose of 50.4 Gy. An SIB technique was also used to treat the tumor to a dose of 50 Gy/6Gy for a total of 56 Gy total.  Narrative:  The patient was contacted today for routine follow-up. During treatment he did very well with radiotherapy and did not have significant desquamation and did have nausea and odynophagia. He reports he has been noticing some improvement in eating but still has pain. He is taking PPI therapy and hycet.   Impression/Plan: 1. Squamous Cell Carcinoma of the Esophagus of the upper and mid esophagus, cTxN1M0 at diagnosis. The patient has been doing well since completion of radiotherapy. We discussed that we would be happy to continue to follow him as needed, but he will also continue to follow up with Dr. Burr Medico in medical oncology. I encouraged him to resume carafate and prn mylanta. We will follow this expectantly    Carola Rhine, PAC

## 2019-11-04 ENCOUNTER — Other Ambulatory Visit: Payer: Self-pay | Admitting: Hematology

## 2019-11-04 DIAGNOSIS — C154 Malignant neoplasm of middle third of esophagus: Secondary | ICD-10-CM

## 2019-11-07 ENCOUNTER — Other Ambulatory Visit: Payer: Self-pay | Admitting: Hematology

## 2019-11-07 MED FILL — PROCHLORPERAZINE 10 MG TAB: 10 | 7 days supply | Qty: 30 | Fill #0

## 2019-11-08 MED FILL — ONDANSETRON HCL 8 MG TABLET: 8 | 15 days supply | Qty: 30 | Fill #0

## 2019-11-11 ENCOUNTER — Encounter (HOSPITAL_COMMUNITY)
Admission: RE | Admit: 2019-11-11 | Discharge: 2019-11-11 | Disposition: A | Discharge status: Home / Self Care | Payer: Self-pay | Source: Ambulatory Visit | Attending: Thoracic Surgery (Cardiothoracic Vascular Surgery) | Admitting: Thoracic Surgery (Cardiothoracic Vascular Surgery)

## 2019-11-11 ENCOUNTER — Other Ambulatory Visit: Payer: Self-pay

## 2019-11-11 DIAGNOSIS — C154 Malignant neoplasm of middle third of esophagus: Secondary | ICD-10-CM | POA: Insufficient documentation

## 2019-11-11 LAB — GLUCOSE, CAPILLARY: Glucose-Capillary: 113 mg/dL — ABNORMAL HIGH (ref 70–99)

## 2019-11-11 MED ORDER — FLUDEOXYGLUCOSE F - 18 (FDG) INJECTION
8.0300 | Freq: Once | INTRAVENOUS | Status: AC | PRN
  Administered 2019-11-11: 8.03 via INTRAVENOUS

## 2019-11-16 ENCOUNTER — Inpatient Hospital Stay: Payer: Self-pay

## 2019-11-16 ENCOUNTER — Inpatient Hospital Stay: Payer: Self-pay | Admitting: Hematology

## 2019-11-16 DIAGNOSIS — C154 Malignant neoplasm of middle third of esophagus: Secondary | ICD-10-CM

## 2019-11-24 ENCOUNTER — Other Ambulatory Visit (HOSPITAL_COMMUNITY)
Admission: RE | Admit: 2019-11-24 | Discharge: 2019-11-24 | Disposition: A | Discharge status: Home / Self Care | Payer: HRSA Program | Source: Ambulatory Visit | Attending: Gastroenterology | Admitting: Gastroenterology

## 2019-11-24 DIAGNOSIS — Z20822 Contact with and (suspected) exposure to covid-19: Secondary | ICD-10-CM | POA: Diagnosis not present

## 2019-11-24 DIAGNOSIS — Z01812 Encounter for preprocedural laboratory examination: Secondary | ICD-10-CM | POA: Diagnosis present

## 2019-11-24 LAB — SARS CORONAVIRUS 2 (TAT 6-24 HRS): SARS Coronavirus 2: NEGATIVE

## 2019-11-25 NOTE — Progress Notes (Signed)
PCP:  Denies Cardiologist:  Denies Oncologist:  Jimmy Niemann, NP  EKG:  08/11/19 CXR:  09/26/19 ECHO:  Denies Stress Test:  Denies Cardiac Cath:  Denies  OSA/CPAP:  No  Covid test 11/24/19   Patient denies shortness of breath, fever, cough, and chest pain at PAT appointment.  Patient verbalized understanding of instructions provided today at the PAT appointment.  Patient asked to review instructions at home and day of surgery.

## 2019-11-28 ENCOUNTER — Encounter (HOSPITAL_COMMUNITY): Payer: Self-pay | Admitting: Gastroenterology

## 2019-11-28 ENCOUNTER — Other Ambulatory Visit (HOSPITAL_COMMUNITY): Payer: Self-pay | Admitting: Gastroenterology

## 2019-11-28 ENCOUNTER — Ambulatory Visit (HOSPITAL_COMMUNITY): Payer: Self-pay | Admitting: Anesthesiology

## 2019-11-28 ENCOUNTER — Other Ambulatory Visit: Payer: Self-pay

## 2019-11-28 ENCOUNTER — Encounter (HOSPITAL_COMMUNITY)
Admission: RE | Disposition: A | Discharge status: Home / Self Care | Payer: Self-pay | Source: Ambulatory Visit | Attending: Gastroenterology

## 2019-11-28 ENCOUNTER — Ambulatory Visit (HOSPITAL_COMMUNITY)
Admission: RE | Admit: 2019-11-28 | Discharge: 2019-11-28 | Disposition: A | Discharge status: Home / Self Care | Payer: Self-pay | Source: Ambulatory Visit | Attending: Gastroenterology | Admitting: Gastroenterology

## 2019-11-28 DIAGNOSIS — K449 Diaphragmatic hernia without obstruction or gangrene: Secondary | ICD-10-CM | POA: Insufficient documentation

## 2019-11-28 DIAGNOSIS — K3189 Other diseases of stomach and duodenum: Secondary | ICD-10-CM

## 2019-11-28 DIAGNOSIS — C159 Malignant neoplasm of esophagus, unspecified: Secondary | ICD-10-CM

## 2019-11-28 DIAGNOSIS — K2101 Gastro-esophageal reflux disease with esophagitis, with bleeding: Secondary | ICD-10-CM | POA: Insufficient documentation

## 2019-11-28 DIAGNOSIS — K2289 Other specified disease of esophagus: Secondary | ICD-10-CM

## 2019-11-28 DIAGNOSIS — K2081 Other esophagitis with bleeding: Secondary | ICD-10-CM

## 2019-11-28 DIAGNOSIS — K2950 Unspecified chronic gastritis without bleeding: Secondary | ICD-10-CM | POA: Insufficient documentation

## 2019-11-28 DIAGNOSIS — Z800 Family history of malignant neoplasm of digestive organs: Secondary | ICD-10-CM | POA: Insufficient documentation

## 2019-11-28 HISTORY — PX: BIOPSY: SHX5522

## 2019-11-28 HISTORY — PX: ESOPHAGOGASTRODUODENOSCOPY (EGD) WITH PROPOFOL: SHX5813

## 2019-11-28 HISTORY — PX: EUS: SHX5427

## 2019-11-28 SURGERY — ESOPHAGOGASTRODUODENOSCOPY (EGD) WITH PROPOFOL
Anesthesia: Monitor Anesthesia Care

## 2019-11-28 MED ORDER — LACTATED RINGERS IV SOLN: INTRAVENOUS | Status: DC | PRN

## 2019-11-28 MED ORDER — LIDOCAINE 2% (20 MG/ML) 5 ML SYRINGE
INTRAMUSCULAR | Status: DC | PRN
  Administered 2019-11-28: 60 mg via INTRAVENOUS
  Administered 2019-11-28: 40 mg via INTRAVENOUS

## 2019-11-28 MED ORDER — DEXMEDETOMIDINE (PRECEDEX) IN NS 20 MCG/5ML (4 MCG/ML) IV SYRINGE
PREFILLED_SYRINGE | INTRAVENOUS | Status: DC | PRN
  Administered 2019-11-28: 8 ug via INTRAVENOUS
  Administered 2019-11-28: 4 ug via INTRAVENOUS
  Administered 2019-11-28: 8 ug via INTRAVENOUS

## 2019-11-28 MED ORDER — SODIUM CHLORIDE 0.9 % IV SOLN: INTRAVENOUS | Status: DC

## 2019-11-28 MED ORDER — PANTOPRAZOLE SODIUM 40 MG PO TBEC: 40.0000 mg | DELAYED_RELEASE_TABLET | Freq: Two times a day (BID) | ORAL | 6 refills | Status: DC

## 2019-11-28 MED ORDER — PROPOFOL 10 MG/ML IV BOLUS
INTRAVENOUS | Status: DC | PRN
  Administered 2019-11-28 (×4): 20 mg via INTRAVENOUS
  Administered 2019-11-28: 40 mg via INTRAVENOUS
  Administered 2019-11-28: 20 mg via INTRAVENOUS
  Administered 2019-11-28: 40 mg via INTRAVENOUS
  Administered 2019-11-28: 20 mg via INTRAVENOUS

## 2019-11-28 MED ORDER — PROPOFOL 500 MG/50ML IV EMUL
INTRAVENOUS | Status: DC | PRN
  Administered 2019-11-28: 200 ug/kg/min via INTRAVENOUS
  Administered 2019-11-28: 150 ug/kg/min via INTRAVENOUS

## 2019-11-28 MED FILL — PANTOPRAZOLE SOD DR 40 MG T: 40 | 30 days supply | Qty: 60 | Fill #0

## 2019-11-28 SURGICAL SUPPLY — 14 items

## 2019-11-28 NOTE — Transfer of Care (Signed)
Immediate Anesthesia Transfer of Care Note  Patient: Jimmy Hawkins  Procedure(s) Performed: ESOPHAGOGASTRODUODENOSCOPY (EGD) WITH PROPOFOL (N/A ) UPPER ENDOSCOPIC ULTRASOUND (EUS) RADIAL (N/A ) BIOPSY  Patient Location: Endoscopy Unit  Anesthesia Type:MAC  Level of Consciousness: awake, alert  and oriented  Airway & Oxygen Therapy: Patient Spontanous Breathing  Post-op Assessment: Report given to RN and Post -op Vital signs reviewed and stable  Post vital signs: Reviewed and stable  Last Vitals:  Vitals Value Taken Time  BP 102/66 11/28/19 1451  Temp 36.7 C 11/28/19 1451  Pulse 94 11/28/19 1455  Resp 20 11/28/19 1455  SpO2 99 % 11/28/19 1455  Vitals shown include unvalidated device data.  Last Pain:  Vitals:   11/28/19 1451  TempSrc: Temporal  PainSc:          Complications: No complications documented.

## 2019-11-28 NOTE — Anesthesia Preprocedure Evaluation (Addendum)
Anesthesia Evaluation  Patient identified by MRN, date of birth, ID band Patient awake    Reviewed: Allergy & Precautions, NPO status , Patient's Chart, lab work & pertinent test results  Airway Mallampati: II  TM Distance: >3 FB Neck ROM: Full    Dental no notable dental hx. (+) Teeth Intact, Dental Advisory Given   Pulmonary neg pulmonary ROS,    Pulmonary exam normal breath sounds clear to auscultation       Cardiovascular Exercise Tolerance: Good negative cardio ROS Normal cardiovascular exam Rhythm:Regular Rate:Normal     Neuro/Psych PSYCHIATRIC DISORDERS negative neurological ROS     GI/Hepatic GERD  ,  Endo/Other  Morbid obesity  Renal/GU negative Renal ROS     Musculoskeletal negative musculoskeletal ROS (+)   Abdominal Normal abdominal exam  (+)   Peds  Hematology  (+) Blood dyscrasia, anemia ,   Anesthesia Other Findings   Reproductive/Obstetrics                             Anesthesia Physical  Anesthesia Plan  ASA: II  Anesthesia Plan: MAC   Post-op Pain Management:    Induction: Intravenous  PONV Risk Score and Plan: 1 and Treatment may vary due to age or medical condition  Airway Management Planned: Nasal Cannula and Natural Airway  Additional Equipment: None  Intra-op Plan:   Post-operative Plan:   Informed Consent: I have reviewed the patients History and Physical, chart, labs and discussed the procedure including the risks, benefits and alternatives for the proposed anesthesia with the patient or authorized representative who has indicated his/her understanding and acceptance.     Dental advisory given  Plan Discussed with: CRNA, Anesthesiologist and Surgeon  Anesthesia Plan Comments:        Anesthesia Quick Evaluation

## 2019-11-28 NOTE — Discharge Instructions (Signed)
Endoscopa alta en los adultos Upper Endoscopy, Adult Una endoscopa alta es un procedimiento que permite observar dentro de la porcin alta del tracto GI (gastrointestinal). La porcin alta del tracto GI se compone de lo siguiente:  La parte del cuerpo que transporta los alimentos desde la boca hasta el estmago (esfago).  El [place].  La primera parte del intestino delgado (duodeno). Este procedimiento tambin se conoce como esofagogastroduodenoscopia (EGD) o gastrostoma. En este procedimiento, el mdico introduce un tubo delgado y flexible (endoscopio) a travs de la boca y por el esfago, hasta el [place]. Se fija una pequea cmara al extremo del tubo. La cmara captura imgenes que se reproducen en un monitor de la sala de examen. Durante este procedimiento, el mdico tambin podra extraer una pequea porcin de tejido para enviarla a un laboratorio a que la examinen con un microscopio (biopsia). El mdico podra realizar una endoscopa alta para diagnosticar distintos tipos de cncer en la porcin alta del tracto GI. Es posible que Youth worker procedimiento para Scientist, research (physical sciences) causa de ciertas afecciones, como:  Dolor de [place].  Acidez estomacal.  Dolor o problemas al tragar.  Nuseas y vmitos.  Hemorragia estomacal.  lceras estomacales. Informe al mdico acerca de lo siguiente:  Cualquier alergia que tenga.  Todos los [employer], incluidos vitaminas, hierbas, gotas oftlmicas, cremas y medicamentos de venta libre.  Cualquier problema previo que usted o algn miembro de su familia haya tenido con los anestsicos.  Cualquier trastorno de la sangre que tenga.  Cirugas a las que se haya sometido.  Cualquier afeccin mdica que tenga.  Si est embarazada o podra estarlo. Cules son los riesgos? En general, se trata de un procedimiento seguro. Sin embargo, pueden ocurrir complicaciones, por ejemplo:  Infeccin.  Sangrado.  Reacciones alrgicas  a los medicamentos.  Un desgarro u orificio (perforacin) del esfago, el estmago o el duodeno. Qu ocurre antes del procedimiento? Hidratacin Siga las indicaciones del mdico acerca de mantenerse hidratado, las cuales pueden incluir lo siguiente:  Hasta 2horas antes del procedimiento, puede beber lquidos transparentes, como agua, jugos de fruta sin pulpa, caf negro y t solo.  Restricciones en las comidas y bebidas Siga las indicaciones del mdico respecto de las comidas y bebidas, las cuales pueden incluir lo siguiente:  8 horas antes del procedimiento, no coma alimentos pesados, por ejemplo, carnes, alimentos con alto contenido graso o fritos.  6 horas antes del procedimiento, deje de ingerir comidas o alimentos livianos, como tostadas o cereales.  6 horas antes del procedimiento, deje de tomar leche o bebidas que [employer].  2 horas antes del procedimiento, deje de beber lquidos transparentes. Medicamentos Consulte al mdico sobre:  Quarry manager o suspender los medicamentos que toma habitualmente. Esto es muy importante si toma medicamentos para la diabetes o anticoagulantes.  Tomar medicamentos como aspirina e ibuprofeno. Estos medicamentos pueden tener un efecto anticoagulante en la [place]. No tome estos medicamentos a menos que el mdico se lo indique.  Tomar medicamentos de [employer], vitaminas, hierbas y suplementos. Indicaciones generales  Haga que alguien lo lleve a su casa desde el hospital o la clnica.  Si se ir a su casa inmediatamente despus del procedimiento, pdale a alguien que se quede con usted durante 24horas.  Pregntele al mdico qu medidas se tomarn para ayudar a prevenir una infeccin. Qu ocurre durante el procedimiento?   Le colocarn una va intravenosa en una de las venas.  Pueden administrarle uno o ms de los siguientes medicamentos: ? Un  medicamento para ayudarlo a relajarse (sedante). ? Un medicamento para adormecer la  garganta (anestesia local).  Deber recostarse del costado izquierdo sobre una camilla.  El mdico har pasar el endoscopio a travs de la boca y Medical illustrator.  Elmdico utilizar el endoscopio para ver el interior de su esfago, estmago y [place]. Podran realizarse biopsias.  El endoscopio se Charity fundraiser. El procedimiento puede variar segn el mdico y el hospital. Sander Nephew ocurre despus del procedimiento?  Le controlarn la presin arterial, la frecuencia cardaca, la frecuencia respiratoria y Retail buyer de oxgeno en la sangre hasta que le den el alta del hospital o la clnica.  No conduzca durante 24horas si le administraron un sedante durante el procedimiento.  Cuando la garganta ya no est adormecida, le pueden dar lquido para beber.  Es su responsabilidad retirar [employer] del procedimiento. Pregntele al mdico o a alguien del departamento donde se realice el procedimiento cundo estarn [employer]. Resumen  Una endoscopa alta es un procedimiento que permite observar dentro de la porcin alta del tracto GI.  Durante el procedimiento, le colocarn una va intravenosa en una de las venas. Es posible que le administren un medicamento para ayudarlo a Nurse, children's.  Usarn un medicamento para anestesiarle la garganta.  Le introducirn el endoscopio por la boca y lo llevarn Programmer, applications. Esta informacin no tiene Marine scientist el consejo del mdico. Asegrese de hacerle al mdico cualquier pregunta que tenga. Document Revised: 07/23/2017 Document Reviewed: 07/23/2017 Elsevier Patient Education  [place].

## 2019-11-28 NOTE — H&P (Signed)
GASTROENTEROLOGY PROCEDURE H&P NOTE   Primary Care Physician: [name], MD  HPI: Jimmy Hawkins is a 44 y.o. male who presents for EGD/EUS for evaluation of esophageal cancer.  Past Medical History:  Diagnosis Date  . GERD (gastroesophageal reflux disease)   . Hematemesis with nausea 08/10/2019   Past Surgical History:  Procedure Laterality Date  . BIOPSY  08/11/2019   Procedure: BIOPSY;  Surgeon: [name], MD;  Location: [facility] ENDOSCOPY;  Service: Endoscopy;;  . ESOPHAGOGASTRODUODENOSCOPY  08/11/2019  . ESOPHAGOGASTRODUODENOSCOPY (EGD) WITH PROPOFOL N/A 08/11/2019   Procedure: ESOPHAGOGASTRODUODENOSCOPY (EGD) WITH PROPOFOL;  Surgeon: [name], MD;  Location: [facility];  Service: Endoscopy;  Laterality: N/A;   Current Facility-Administered Medications  Medication Dose Route Frequency Provider Last Rate Last Admin  . 0.9 %  sodium chloride infusion   Intravenous Continuous Mansouraty, Telford Nab., MD       No Known Allergies Family History  Problem Relation Age of Onset  . Stomach cancer Mother   . Healthy Father    Social History   Socioeconomic History  . Marital status: Single    Spouse name: Not on file  . Number of children: Not on file  . Years of education: Not on file  . Highest education level: Not on file  Occupational History  . Not on file  Tobacco Use  . Smoking status: Never Smoker  . Smokeless tobacco: Never Used  Vaping Use  . Vaping Use: Never used  Substance and Sexual Activity  . Alcohol use: Not Currently    Alcohol/week: 6.0 standard drinks    Types: 6 Cans of beer per week    Comment:  6 drinks/day  . Drug use: Never  . Sexual activity: Not on file  Other Topics Concern  . Not on file  Social History Narrative  . Not on file   Social Determinants of Health   Financial Resource Strain:   . Difficulty of Paying Living Expenses: Not on file  Food Insecurity:   . Worried About Charity fundraiser in the Last  Year: Not on file  . Ran Out of Food in the Last Year: Not on file  Transportation Needs:   . Lack of Transportation (Medical): Not on file  . Lack of Transportation (Non-Medical): Not on file  Physical Activity:   . Days of Exercise per Week: Not on file  . Minutes of Exercise per Session: Not on file  Stress:   . Feeling of Stress : Not on file  Social Connections:   . Frequency of Communication with Friends and Family: Not on file  . Frequency of Social Gatherings with Friends and Family: Not on file  . Attends Religious Services: Not on file  . Active Member of Clubs or Organizations: Not on file  . Attends Archivist Meetings: Not on file  . Marital Status: Not on file  Intimate Partner Violence:   . Fear of Current or Ex-Partner: Not on file  . Emotionally Abused: Not on file  . Physically Abused: Not on file  . Sexually Abused: Not on file    Physical Exam: Vital signs in last 24 hours: Temp:  [98.9 F (37.2 C)] 98.9 F (37.2 C) (11/01 1211) Pulse Rate:  [98] 98 (11/01 1211) Resp:  [12] 12 (11/01 1211) BP: (136)/(95) 136/95 (11/01 1211) SpO2:  [100 %] 100 % (11/01 1211)   GEN: NAD EYE: Sclerae anicteric ENT: MMM CV: Non-tachycardic GI: Soft, NT/ND NEURO:  Alert &  Oriented x 3  Lab Results: No results for input(s): WBC, HGB, HCT, PLT in the last 72 hours. BMET No results for input(s): NA, K, CL, CO2, GLUCOSE, BUN, CREATININE, CALCIUM in the last 72 hours. LFT No results for input(s): PROT, ALBUMIN, AST, ALT, ALKPHOS, BILITOT, BILIDIR, IBILI in the last 72 hours. PT/INR No results for input(s): LABPROT, INR in the last 72 hours.   Impression / Plan: This is a 44 y.o.male who presents for EGD/EUS for evaluation of esophageal cancer.  The risks of an EUS including intestinal perforation, bleeding, infection, aspiration, and medication effects were discussed as was the possibility it may not give a definitive diagnosis if a biopsy is performed.  When  a biopsy of the pancreas is done as part of the EUS, there is an additional risk of pancreatitis at the rate of about 1-2%.  It was explained that procedure related pancreatitis is typically mild, although it can be severe and even life threatening, which is why we do not perform random pancreatic biopsies and only biopsy a lesion/area we feel is concerning enough to warrant the risk.   The risks and benefits of endoscopic evaluation were discussed with the patient; these include but are not limited to the risk of perforation, infection, bleeding, missed lesions, lack of diagnosis, severe illness requiring hospitalization, as well as anesthesia and sedation related illnesses.  The patient is agreeable to proceed.    Justice Britain, MD [facility] Advanced Endoscopy Office # [phone]

## 2019-11-28 NOTE — Op Note (Signed)
[facility] Patient Name: Jimmy Hawkins Procedure Date : 11/28/2019 MRN: [id number] Attending MD: Justice Britain , MD Date of Birth: [birth year] CSN: [id number] Age: 44 Admit Type: Outpatient Procedure:                Upper EUS Indications:              Malignant esophageal squamous cell carcinoma,                            Follow-up of malignant esophageal squamous cell                            carcinoma, Abnormal CT of the GI tract,                            Preoperative assessment Providers:                Justice Britain, MD, Jeanella Cara, RN,                            Cletis Athens, Technician Referring MD:             [name], Jodelle Gross, [place] K.                            Dionisio Paschal, MD Medicines:                Monitored Anesthesia Care Complications:            No immediate complications. Estimated Blood Loss:     Estimated blood loss was minimal. Procedure:                Pre-Anesthesia Assessment:                           - Prior to the procedure, a History and Physical                            was performed, and patient medications and                            allergies were reviewed. The patient's tolerance of                            previous anesthesia was also reviewed. The risks                            and benefits of the procedure and the sedation                            options and risks were discussed with the patient.                            All questions were answered, and informed consent  was obtained. Prior Anticoagulants: The patient has                            taken no previous anticoagulant or antiplatelet                            agents. ASA Grade Assessment: III - A patient with                            severe systemic disease. After reviewing the risks                            and benefits, the patient was deemed in                             satisfactory condition to undergo the procedure.                           After obtaining informed consent, the endoscope was                            passed under direct vision. Throughout the                            procedure, the patient's blood pressure, pulse, and                            oxygen saturations were monitored continuously. The                            [id number]-[id number] ([id number]) Olympus gastroscope was                            introduced through the mouth, and advanced to the                            second part of duodenum. After obtaining informed                            consent, the endoscope was passed under direct                            vision. Throughout the procedure, the patient's                            blood pressure, pulse, and oxygen saturations were                            monitored continuously. Could not traverse the                            middle esophagus with the radial EUS (likely result  of prior radiation). The [id number] ([id number])                            Olympus mini ultrasound probe was introduced                            through the EGD scope and placed advanced to the                            lower third of esophagus/GE Jxn. The EUS was                            accomplished. The patient tolerated the procedure. Scope In: Scope Out: Findings:      No gross lesions were noted in the proximal esophagus.      LA Grade D (one or more mucosal breaks involving at least 75% of       esophageal circumference) esophagitis with bleeding was found 27 to 37       cm from the incisors. This was biopsied with a cold forceps for       histology after EUS was completed. Unfortunately, we could not pass the       radial EUS scope into the distal esophagus or stomach as a result of the       significant esophagitis. As noted below EUS Mini-Probe used for EUS       evaluation.      The  Z-line was regular and was found 37 cm from the incisors.      A 3 cm hiatal hernia was present.      Patchy moderately erythematous mucosa without bleeding was found in the       entire examined stomach.      No other gross lesions were noted in the entire examined stomach.       Biopsies were taken with a cold forceps for histology and Helicobacter       pylori testing.      No gross lesions were noted in the duodenal bulb, in the first portion       of the duodenum and in the second portion of the duodenum.      ENDOSONOGRAPHIC FINDING: :      The previously noted esophageal cancer was noted at 30 cm then distal to       this. At this time, only diffuse wall thickening was visualized       endosonographically in the thoracic esophagus going into the       gastroesophageal junction. This was encountered at 27 cm from the       incisors and extended to 36 cm. This appeared to be primarily due to       thickening of the luminal interface/superficial mucosa (Layer 1) and       deep mucosa (Layer 2). Additionally, wall thickening was noted in the       following wall layer(s): submucosa (Layer 3). The esophageal wall       measured up to 4.0 - 6.5 mm in total thickness.      No malignant-appearing lymph nodes were visualized in the middle       paraesophageal mediastinum (level 15M), lower paraesophageal mediastinum       (level 8L), diaphragmatic region (level 15) and paracardial region       (  level 16). Impression:               EGD Impression:                           - No gross lesions in esophagus proximally.                           - Previously noted esophageal cancer is not                            present. 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 Biopsied after the EUS                            was complete to evaluate for persistent disease or                             not.                           - Z-line regular, 37 cm from the incisors.                           - 3 cm hiatal hernia.                           - Erythematous mucosa in the stomach. No other                            gross lesions in the stomach. Biopsied.                           - No gross lesions in the duodenal bulb, in the                            first portion of the duodenum and in the second                            portion of the duodenum.                           EUS Impression:                           - Wall thickening was seen in the thoracic                            esophagus into the gastroesophageal junction. The  thickening appeared to primarily be within the                            luminal interface/superficial mucosa (Layer 1) and                            deep mucosa (Layer 2). The previously noted                            esophageal cancer has been replaced by what appears                            to be radiation/chemotherapy induced changes. With                            the limitations of having to use the mini-probe                            EUS, I could not visualize persistent signfiicant                            disease other than the entire distal area of the                            esophagus having likely changes noted as above.                           - No malignant-appearing lymph nodes were                            visualized in the middle paraesophageal mediastinum                            (level 16M), lower paraesophageal mediastinum (level                            8L), diaphragmatic region (level 15) and                            paracardial region (level 16). Recommendation:           - The patient will be observed post-procedure,                            until all discharge criteria are met.                           - Discharge patient to home.                            - Resume previous diet.                           - Observe patient's clinical course.                           -  Await path results.                           - Restart Pantoprazole 40 mg twice daily (new                            prescription has been sent to the pharmacy).                           - Observe patient's clinical course.                           - After results return will discuss with                            Oncology/Thoracic Surgery/Radiation Oncology as to                            next steps in evaluation/treatment for patient.                           - The findings and recommendations were discussed                            with the patient. Procedure Code(s):        --- Professional ---                           [phone], Esophagogastroduodenoscopy, flexible,                            transoral; with endoscopic ultrasound examination                            limited to the esophagus, stomach or duodenum, and                            adjacent structures                           43239, Esophagogastroduodenoscopy, flexible,                            transoral; with biopsy, single or multiple Diagnosis Code(s):        --- Professional ---                           K48.JEHU, Radiation sickness, unspecified, initial                            encounter                           K20.81, Other esophagitis with bleeding                           K44.9, Diaphragmatic hernia without obstruction or  gangrene                           K31.89, Other diseases of stomach and duodenum                           K22.8, Other specified diseases of esophagus                           I89.9, Noninfective disorder of lymphatic vessels                            and lymph nodes, unspecified                           C15.9, Malignant neoplasm of esophagus, unspecified                           Z01.818, Encounter for other preprocedural                             examination                           R93.3, Abnormal findings on diagnostic imaging of                            other parts of digestive tract CPT copyright 2019 American Medical Association. All rights reserved. The codes documented in this report are preliminary and upon coder review may  be revised to meet current compliance requirements. Justice Britain, MD 11/28/2019 3:01:38 PM Number of Addenda: 0

## 2019-11-28 NOTE — Anesthesia Procedure Notes (Signed)
Procedure Name: MAC Date/Time: 11/28/2019 1:57 PM Performed by: Lieutenant Diego, CRNA Pre-anesthesia Checklist: Patient identified, Emergency Drugs available, Suction available, Patient being monitored and Timeout performed Patient Re-evaluated:Patient Re-evaluated prior to induction Oxygen Delivery Method: Simple face mask Preoxygenation: Pre-oxygenation with 100% oxygen Induction Type: IV induction

## 2019-11-30 ENCOUNTER — Other Ambulatory Visit: Payer: Self-pay | Admitting: Physician Assistant

## 2019-11-30 ENCOUNTER — Encounter: Payer: Self-pay | Admitting: Gastroenterology

## 2019-11-30 ENCOUNTER — Encounter (HOSPITAL_COMMUNITY): Payer: Self-pay | Admitting: Gastroenterology

## 2019-11-30 ENCOUNTER — Other Ambulatory Visit: Payer: Self-pay | Admitting: Hematology

## 2019-11-30 LAB — SURGICAL PATHOLOGY

## 2019-11-30 MED FILL — OMEPRAZOLE 40 MG CPDR: 40 | 30 days supply | Qty: 30 | Fill #2

## 2019-12-01 ENCOUNTER — Other Ambulatory Visit: Payer: Self-pay

## 2019-12-01 DIAGNOSIS — D500 Iron deficiency anemia secondary to blood loss (chronic): Secondary | ICD-10-CM

## 2019-12-01 MED ORDER — IRON 325 (65 FE) MG PO TABS: 1.0000 | ORAL_TABLET | Freq: Two times a day (BID) | ORAL | 3 refills | Status: DC

## 2019-12-01 MED FILL — FERROUS SULFATE 325 MG TAB: 325 (65 FE) | 30 days supply | Qty: 60 | Fill #0

## 2019-12-01 NOTE — Anesthesia Postprocedure Evaluation (Signed)
Anesthesia Post Note  Patient: Jimmy Hawkins  Procedure(s) Performed: ESOPHAGOGASTRODUODENOSCOPY (EGD) WITH PROPOFOL (N/A ) UPPER ENDOSCOPIC ULTRASOUND (EUS) RADIAL (N/A ) BIOPSY     Patient location during evaluation: Endoscopy Anesthesia Type: MAC Level of consciousness: awake and alert Pain management: pain level controlled Vital Signs Assessment: post-procedure vital signs reviewed and stable Respiratory status: spontaneous breathing, nonlabored ventilation, respiratory function stable and patient connected to nasal cannula oxygen Cardiovascular status: blood pressure returned to baseline and stable Postop Assessment: no apparent nausea or vomiting Anesthetic complications: no   No complications documented.  Last Vitals:  Vitals:   11/28/19 1211 11/28/19 1451  BP: (!) 136/95 102/66  Pulse: 98 98  Resp: 12 20  Temp: 37.2 C 36.7 C  SpO2: 100% 99%    Last Pain:  Vitals:   11/29/19 1623  TempSrc:   PainSc: 0-No pain                 [name] S

## 2019-12-02 ENCOUNTER — Encounter: Payer: Self-pay | Admitting: Thoracic Surgery (Cardiothoracic Vascular Surgery)

## 2019-12-02 NOTE — Progress Notes (Deleted)
[address]       [place] [address]             [phone]                    Jimmy Hawkins Medical Record #[id number] Date of Birth: [birth year]  Referring: [name], MD Primary Care: [name], MD Primary Cardiologist: No primary care provider on file.  Chief Complaint:   No chief complaint on file.   History of Present Illness:    Jimmy Hawkins 44 y.o. male ***    Smoking Hx: ***   Zubrod Score: At the time of surgery this patient's most appropriate activity status/level should be described as: []     0    Normal activity, no symptoms []     1    Restricted in physical strenuous activity but ambulatory, able to do out light work []     2    Ambulatory and capable of self care, unable to do work activities, up and about               >50 % of waking hours                              []     3    Only limited self care, in bed greater than 50% of waking hours []     4    Completely disabled, no self care, confined to bed or chair []     5    Moribund   Past Medical History:  Diagnosis Date  . GERD (gastroesophageal reflux disease)   . Hematemesis with nausea 08/10/2019    Past Surgical History:  Procedure Laterality Date  . BIOPSY  08/11/2019   Procedure: BIOPSY;  Surgeon: [name], MD;  Location: [facility] ENDOSCOPY;  Service: Endoscopy;;  . BIOPSY  11/28/2019   Procedure: BIOPSY;  Surgeon: [name], MD;  Location: [place];  Service: Gastroenterology;;  . ESOPHAGOGASTRODUODENOSCOPY  08/11/2019  . ESOPHAGOGASTRODUODENOSCOPY (EGD) WITH PROPOFOL N/A 08/11/2019   Procedure: ESOPHAGOGASTRODUODENOSCOPY (EGD) WITH PROPOFOL;  Surgeon: [name], MD;  Location: [place];  Service: Endoscopy;  Laterality: N/A;  . ESOPHAGOGASTRODUODENOSCOPY (EGD) WITH PROPOFOL N/A 11/28/2019   Procedure: ESOPHAGOGASTRODUODENOSCOPY (EGD) WITH PROPOFOL;  Surgeon: Rush Landmark Telford Nab., MD;  Location: [place];  Service: Gastroenterology;  Laterality: N/A;  . EUS N/A 11/28/2019   Procedure: UPPER ENDOSCOPIC ULTRASOUND (EUS) RADIAL;  Surgeon: Rush Landmark Telford Nab., MD;  Location: [place];  Service: Gastroenterology;  Laterality: N/A;    Family History  Problem Relation Age of Onset  . Stomach cancer Mother   . Healthy Father      Social History   Tobacco Use  Smoking Status Never Smoker  Smokeless Tobacco Never Used    Social History   Substance and Sexual Activity  Alcohol Use Not Currently  . Alcohol/week: 6.0 standard drinks  . Types: 6 Cans of beer per week   Comment:  6 drinks/day     No Known Allergies  Current Outpatient Medications  Medication Sig Dispense Refill  . diphenhydrAMINE-APAP, sleep, (TYLENOL PM EXTRA STRENGTH) 50-1000 MG/30ML LIQD Take 15 mLs by mouth at bedtime as needed (sleep).    . Ferrous Sulfate (IRON) 325 (65 Fe) MG TABS Take 1 tablet (325 mg total) by mouth 2 (two) times daily. 60 tablet 3  . HYDROcodone-acetaminophen (HYCET) 7.5-325  mg/15 ml solution Take 15 mLs by mouth every 6 (six) hours as needed for moderate pain. (Patient taking differently: Take 15 mLs by mouth 2 (two) times daily as needed for moderate pain. ) 473 mL 0  . pantoprazole (PROTONIX) 40 MG tablet Take 1 tablet (40 mg total) by mouth 2 (two) times daily. 60 tablet 6  . prochlorperazine (COMPAZINE) 10 MG tablet TAKE 1 TABLET BY MOUTH EVERY 6 HOURS AS NEEDED FOR NAUSEA AND/OR VOMITING (Patient taking differently: Take 10 mg by mouth every 6 (six) hours as needed for nausea or vomiting. ) 30 tablet 0  . sucralfate (CARAFATE) 1 g tablet Take 1 tablet (1 g total) by mouth 4 (four) times daily. Dissolve each tablet in 15 cc water before use. 120 tablet 2   No current facility-administered medications for this visit.    ROS   PHYSICAL EXAMINATION: There were no vitals taken for this visit. Physical Exam  Diagnostic Studies & Laboratory data:    CT Scan: *** PET/CT:  *** EGD/EUS: *** Manometry: *** Path: *** Radiation Hx: ***          I have independently reviewed the above radiology studies  and reviewed the findings with the patient.   Recent Lab Findings: Lab Results  Component Value Date   WBC 2.3 (L) 10/17/2019   HGB 10.6 (L) 10/17/2019   HCT 31.5 (L) 10/17/2019   PLT 187 10/17/2019   GLUCOSE 100 (H) 10/17/2019   ALT 11 10/17/2019   AST 18 10/17/2019   NA 135 10/17/2019   K 3.9 10/17/2019   CL 104 10/17/2019   CREATININE 0.67 10/17/2019   BUN <4 (L) 10/17/2019   CO2 25 10/17/2019   TSH 0.357 08/11/2019   INR 1.2 08/10/2019     PFTs: - FVC: ***% - FEV1: ***% -DLCO: ***%  Problem List: ***  Assessment / Plan:   ***     I  spent {CHL ONC TIME VISIT - YBWLS:[id number]} with  the patient face to face and greater then 50% of the time was spent in counseling and coordination of care.    [name] 12/02/2019 1:19 PM

## 2019-12-05 ENCOUNTER — Telehealth: Payer: Self-pay | Admitting: Hematology

## 2019-12-05 NOTE — Progress Notes (Signed)
[place]   Telephone:([phone]) [phone] Fax:(336) [phone]   Clinic Follow up Note   Patient Care Team: [name], MD as PCP - General (Hematology) Jonnie Finner, RN as Oncology Nurse Navigator [name], MD as Consulting Physician (Oncology)  Date of Service:  12/08/2019  CHIEF COMPLAINT: F/u ofMiddleEsophageal Squamous CellCarcinoma  SUMMARY OF ONCOLOGIC HISTORY: Oncology History Overview Note  Cancer Staging Malignant neoplasm of middle third of esophagus ([place]) Staging form: Esophagus - Squamous Cell Carcinoma, AJCC 8th Edition - Clinical: Stage Unknown (cTX, cN1, cM0) - Signed by [name], MD on 08/18/2019    Malignant neoplasm of middle third of esophagus [facility])   Initial Diagnosis   Malignant neoplasm of middle third of esophagus ([place])   08/11/2019 Imaging   CT CAP W contrast 08/11/19 IMPRESSION: 1. Mid to lower esophageal mass along an 8 cm segment, large endoluminal component, strongly favoring esophageal malignancy. Borderline enlarged AP window lymph node. No findings of distant metastatic disease. 2. Trace bilateral pleural effusions. 3. Diffuse hepatic steatosis. 4. Cholelithiasis. 5. Fatty spermatic cords likely due to small bilateral indirect inguinal hernias.   08/11/2019 Procedure   EGD by Dr Carlean Purl  IMPRESSION - Partially obstructing, likely malignant esophageal tumor was found in the upper third of the esophagus and in the middle third of the esophagus. Biopsied. Very large and long - difficult but able to advance scope by this lesion - await CT for better length estimate - Normal stomach. - Normal examined duodenum.   08/11/2019 Initial Biopsy   FINAL MICROSCOPIC DIAGNOSIS:   A. ESOPHAGUS, UPPER, BIOPSY:  - Squamous cell carcinoma.    08/18/2019 Cancer Staging   Staging form: Esophagus - Squamous Cell Carcinoma, AJCC 8th Edition - Clinical: Stage Unknown (cTX, cN1, cM0) - Signed by [name], MD on 08/18/2019   08/29/2019 -  10/04/2019 Chemotherapy   Concurrent chemoradiation with weekly CT for 6 weeks starting 08/29/19-10/04/19    08/29/2019 - 10/06/2019 Radiation Therapy   Concurrent chemoradiation with Dr Lisbeth Renshaw starting 08/29/19-10/06/19   11/11/2019 PET scan   IMPRESSION: 1. Again seen is a long segment of FDG avid wall thickening of the distal half of the thoracic esophagus compatible with known esophageal neoplasm. 2. No signs of FDG avid nodal or distant metastatic disease.     11/28/2019 Procedure   EUS by Dr Rush Landmark IMPRESSION EGD Impression: - No gross lesions in esophagus proximally. - Previously noted esophageal cancer is not present. However, in its place and extending distal to this is likely LA Grade D chemotherapy/radiation esophagitis with bleeding. Biopsied after the EUS was complete to evaluate for persistent disease or not. - Z-line regular, 37 cm from the incisors. - 3 cm hiatal hernia. - Erythematous mucosa in the stomach. No other gross lesions in the stomach. Biopsied. - No gross lesions in the duodenal bulb, in the first portion of the duodenum and in the second portion of the duodenum. EUS Impression: - Wall thickening was seen in the thoracic esophagus into the gastroesophageal junction. The thickening appeared to primarily be within the luminal interface/superficial mucosa (Layer 1) and deep mucosa (Layer 2). The previously noted esophageal cancer has been replaced by what appears to be radiation/chemotherapy induced changes. With the limitations of having to use the mini-probe EUS, I could not visualize persistent significant disease other than the entire distal area of the esophagus having likely changes noted as above. - No malignant-appearing lymph nodes were visualized in the middle paraesophageal mediastinum (level 75M), lower paraesophageal mediastinum (  level 8L), diaphragmatic region (level 15) and paracardial region (level 16).      FINAL MICROSCOPIC DIAGNOSIS:   A.  STOMACH, BIOPSY:  - Mildly active chronic gastritis.  - Warthin-Starry negative for Helicobacter pylori.  - No intestinal metaplasia, dysplasia or carcinoma.   B. ESOPHAGUS, BIOPSY:  - Inflamed granulation tissue and exudate consistent with ulcer.  - No malignancy identified.    12/13/2019 -  Chemotherapy   The patient had dexamethasone (DECADRON) 4 MG tablet, 8 mg, Oral, Daily, 0 of 1 cycle, Start date: --, End date: -- capecitabine (XELODA) 500 MG tablet, 850 mg/m2 = 1,500 mg (100 % of original dose 850 mg/m2), Oral, 2 times daily, 0 of 1 cycle, Start date: --, End date: -- Dose modification: 850 mg/m2 (original dose 850 mg/m2, Cycle 0) palonosetron (ALOXI) injection 0.25 mg, 0.25 mg, Intravenous,  Once, 0 of 4 cycles oxaliplatin (ELOXATIN) 245 mg in dextrose 5 % 500 mL chemo infusion, 130 mg/m2 = 245 mg, Intravenous,  Once, 0 of 4 cycles  for chemotherapy treatment.       CURRENT THERAPY:  Surveillance   INTERVAL HISTORY:  Jimmy Hawkins is here for a follow up. He presents to the clinic with his sister and Spanish interpreter.  He is doing well, has recovered well from chemo and radiation.  He noticed mild pain in between his shoulder blade, he feels it's muscular pain.  No significant dysphagia or odynophagia.  He is eating well, weight is stable.  He was supposed to see Dr. Kipp Brood last week but did not go because he was not aware of his appointment.   All other systems were reviewed with the patient and are negative.  MEDICAL HISTORY:  Past Medical History:  Diagnosis Date  . GERD (gastroesophageal reflux disease)   . Hematemesis with nausea 08/10/2019    SURGICAL HISTORY: Past Surgical History:  Procedure Laterality Date  . BIOPSY  08/11/2019   Procedure: BIOPSY;  Surgeon: [name], MD;  Location: [facility] ENDOSCOPY;  Service: Endoscopy;;  . BIOPSY  11/28/2019   Procedure: BIOPSY;  Surgeon: [name], MD;  Location: [place];  Service:  Gastroenterology;;  . ESOPHAGOGASTRODUODENOSCOPY  08/11/2019  . ESOPHAGOGASTRODUODENOSCOPY (EGD) WITH PROPOFOL N/A 08/11/2019   Procedure: ESOPHAGOGASTRODUODENOSCOPY (EGD) WITH PROPOFOL;  Surgeon: [name], MD;  Location: [place];  Service: Endoscopy;  Laterality: N/A;  . ESOPHAGOGASTRODUODENOSCOPY (EGD) WITH PROPOFOL N/A 11/28/2019   Procedure: ESOPHAGOGASTRODUODENOSCOPY (EGD) WITH PROPOFOL;  Surgeon: Rush Landmark Telford Nab., MD;  Location: [place];  Service: Gastroenterology;  Laterality: N/A;  . EUS N/A 11/28/2019   Procedure: UPPER ENDOSCOPIC ULTRASOUND (EUS) RADIAL;  Surgeon: Rush Landmark Telford Nab., MD;  Location: [place];  Service: Gastroenterology;  Laterality: N/A;    I have reviewed the social history and family history with the patient and they are unchanged from previous note.  ALLERGIES:  has No Known Allergies.  MEDICATIONS:  Current Outpatient Medications  Medication Sig Dispense Refill  . diphenhydrAMINE-APAP, sleep, (TYLENOL PM EXTRA STRENGTH) 50-1000 MG/30ML LIQD Take 15 mLs by mouth at bedtime as needed (sleep).    . Ferrous Sulfate (IRON) 325 (65 Fe) MG TABS Take 1 tablet (325 mg total) by mouth 2 (two) times daily. 60 tablet 3  . HYDROcodone-acetaminophen (HYCET) 7.5-325 mg/15 ml solution Take 15 mLs by mouth every 6 (six) hours as needed for moderate pain. (Patient taking differently: Take 15 mLs by mouth 2 (two) times daily as needed for moderate pain. ) 473 mL 0  .  pantoprazole (PROTONIX) 40 MG tablet Take 1 tablet (40 mg total) by mouth 2 (two) times daily. 60 tablet 6  . sucralfate (CARAFATE) 1 g tablet Take 1 tablet (1 g total) by mouth 4 (four) times daily. Dissolve each tablet in 15 cc water before use. 120 tablet 2   No current facility-administered medications for this visit.    PHYSICAL EXAMINATION: ECOG PERFORMANCE STATUS: 0 - Asymptomatic  Vitals:   12/08/19 0831  BP: 118/81  Pulse: (!) 102  Resp: 17  Temp: (!) 96.9 F (36.1 C)    SpO2: 99%   Filed Weights   12/08/19 0831  Weight: 170 lb 8 oz (77.3 kg)    GENERAL:alert, no distress and comfortable SKIN: skin color, texture, turgor are normal, no rashes or significant lesions EYES: normal, Conjunctiva are pink and non-injected, sclera clear NECK: supple, thyroid normal size, non-tender, without nodularity LYMPH:  no palpable lymphadenopathy in the cervical, axillary  LUNGS: clear to auscultation and percussion with normal breathing effort HEART: regular rate & rhythm and no murmurs and no lower extremity edema ABDOMEN:abdomen soft, non-tender and normal bowel sounds Musculoskeletal:no cyanosis of digits and no clubbing  NEURO: alert & oriented x 3 with fluent speech, no focal motor/sensory deficits  LABORATORY DATA:  I have reviewed the data as listed CBC Latest Ref Rng & Units 12/08/2019 10/17/2019 10/04/2019  WBC 4.0 - 10.5 K/uL 4.1 2.3(L) 2.4(L)  Hemoglobin 13.0 - 17.0 g/dL 13.9 10.6(L) 10.6(L)  Hematocrit 39 - 52 % 39.5 31.5(L) 31.4(L)  Platelets 150 - 400 K/uL 173 187 183     CMP Latest Ref Rng & Units 12/08/2019 10/17/2019 10/04/2019  Glucose 70 - 99 mg/dL 104(H) 100(H) 107(H)  BUN 6 - 20 mg/dL 5(L) <4(L) 10  Creatinine 0.61 - 1.24 mg/dL 0.67 0.67 0.62  Sodium 135 - 145 mmol/L 140 135 134(L)  Potassium 3.5 - 5.1 mmol/L 3.8 3.9 3.6  Chloride 98 - 111 mmol/L 106 104 102  CO2 22 - 32 mmol/L '24 25 25  ' Calcium 8.9 - 10.3 mg/dL 8.4(L) 8.9 9.1  Total Protein 6.5 - 8.1 g/dL 7.4 6.5 6.7  Total Bilirubin 0.3 - 1.2 mg/dL 0.4 0.4 0.6  Alkaline Phos 38 - 126 U/L 91 74 55  AST 15 - 41 U/L '19 18 22  ' ALT 0 - 44 U/L '8 11 12      ' RADIOGRAPHIC STUDIES: I have personally reviewed the radiological images as listed and agreed with the findings in the report. No results found.   ASSESSMENT & PLAN:  Jimmy Hawkins is a 44 y.o. male with     1.[name], cTxN1M0 -Hewas diagnosed in 07/2019 withpresented withSquamous  Cell Carcinoma as seen on EGD.08/11/19 CT CAP found him to havemidesophageal mass and enlarged AP window node, no distant metastasis.  -Ipreviouslydiscussed treatment options for locally advanced esophageal cancer, including neoadjuvant concurrent chemoradiation, followed by esophagectomy, and adjuvant immunotherapy.  -He completed 6 weeks of concurrent chemoRT withweekly Carboplatin and Taxol 08/29/19-10/06/19 -Repeat PET scan October 2021 showed months of FDG avid worsening of distal half of the thoracic esophagus, no notable distant metastasis.  Dr. Kipp Brood has reviewed the PET scan, and feels that his tumor invades trachea not resectable. -EUS on 11/28/2019 showed thickening of esophageal wall tumor site, no visible tumor, biopsy of the mucosa was negative for malignant cells. -I recommend consolidation chemotherapy with Irena ox or FOLFOX for 3 months, potential benefit and side effects discussed with patient in details he agrees to proceed. --Chemotherapy  consent: Side effects including but does not not limited to, fatigue, nausea, vomiting, diarrhea, hair loss, cold sensitivity and neuropathy, fluid retention, renal and kidney dysfunction, neutropenic fever, needed for blood transfusion, bleeding, were discussed with patient in great detail. He agrees to proceed. -Patient does not have a port, he is young, should be able to tolerate CAPOX -I called in Xeloda 1500 mg twice daily for day 1-14 every 21 days to [place] today  -plan to start next week    2.Moderate anemia secondary to GI bleeding and probable iron deficiency -He has hadrecurrentNausea and vomiting withhematemesissince 01/2019.  -He required blood transfusion on 08/15/19 and IV Feraheme on 7/16/21and 08/22/19. -Continue oral iron. -anemia resolved  -He has gained 10lbs since he completed chemotherapy  3.Heavy alcohol user  -He has stopped alcohol drinkingsince cancer diagnosis. Per his sister, he still has  craving for alcohol, I will refer him to social worker. -he is not drinking alcohol lately   5. Social and Acupuncturist -Patient does not have insurance.He is also undocumented. -He has met withfinancial advocate Shauna and Loreli Dollar he has grant assistance with medications.   Ipreviouslydiscussed for grant use he needs to use [facility].   PLAN: -I recommend consultation chemotherapy CAPOX every 21 days for 4 cycles -I called in Xeloda 1500 mg twice daily for day 1-14, to [place] to start first cycle CAPOX next week, with toxicity checkup 1 week after -no plan for port placement for now    No problem-specific Assessment & Plan notes found for this encounter.   No orders of the defined types were placed in this encounter.  All questions were answered. The patient knows to call the clinic with any problems, questions or concerns. No barriers to learning was detected. The total time spent in the appointment was 40 minutes.     [name], MD 12/08/2019   I, Joslyn Devon, am acting as scribe for [name], MD.   I have reviewed the above documentation for accuracy and completeness, and I agree with the above.

## 2019-12-05 NOTE — Telephone Encounter (Signed)
Scheduled apt per 11/8 sch msg - pt daughter is aware of appt.

## 2019-12-08 ENCOUNTER — Telehealth: Payer: Self-pay | Admitting: Pharmacist

## 2019-12-08 ENCOUNTER — Inpatient Hospital Stay: Payer: Self-pay | Attending: Hematology | Admitting: Hematology

## 2019-12-08 ENCOUNTER — Inpatient Hospital Stay: Payer: Self-pay

## 2019-12-08 ENCOUNTER — Encounter: Payer: Self-pay | Admitting: Hematology

## 2019-12-08 ENCOUNTER — Other Ambulatory Visit: Payer: Self-pay

## 2019-12-08 VITALS — BP 118/81 | HR 102 | Temp 96.9°F | Resp 17 | Ht 66.0 in | Wt 170.5 lb

## 2019-12-08 DIAGNOSIS — F1099 Alcohol use, unspecified with unspecified alcohol-induced disorder: Secondary | ICD-10-CM | POA: Insufficient documentation

## 2019-12-08 DIAGNOSIS — C154 Malignant neoplasm of middle third of esophagus: Secondary | ICD-10-CM

## 2019-12-08 DIAGNOSIS — Z5111 Encounter for antineoplastic chemotherapy: Secondary | ICD-10-CM | POA: Insufficient documentation

## 2019-12-08 DIAGNOSIS — D500 Iron deficiency anemia secondary to blood loss (chronic): Secondary | ICD-10-CM

## 2019-12-08 LAB — CBC WITH DIFFERENTIAL (CANCER CENTER ONLY)
Abs Immature Granulocytes: 0.02 10*3/uL (ref 0.00–0.07)
Basophils Absolute: 0 10*3/uL (ref 0.0–0.1)
Basophils Relative: 1 %
Eosinophils Absolute: 0.2 10*3/uL (ref 0.0–0.5)
Eosinophils Relative: 4 %
HCT: 39.5 % (ref 39.0–52.0)
Hemoglobin: 13.9 g/dL (ref 13.0–17.0)
Immature Granulocytes: 1 %
Lymphocytes Relative: 23 %
Lymphs Abs: 1 10*3/uL (ref 0.7–4.0)
MCH: 32.1 pg (ref 26.0–34.0)
MCHC: 35.2 g/dL (ref 30.0–36.0)
MCV: 91.2 fL (ref 80.0–100.0)
Monocytes Absolute: 0.7 10*3/uL (ref 0.1–1.0)
Monocytes Relative: 17 %
Neutro Abs: 2.2 10*3/uL (ref 1.7–7.7)
Neutrophils Relative %: 54 %
Platelet Count: 173 10*3/uL (ref 150–400)
RBC: 4.33 MIL/uL (ref 4.22–5.81)
RDW: 12.3 % (ref 11.5–15.5)
WBC Count: 4.1 10*3/uL (ref 4.0–10.5)
nRBC: 0 % (ref 0.0–0.2)

## 2019-12-08 LAB — CMP (CANCER CENTER ONLY)
ALT: 8 U/L (ref 0–44)
AST: 19 U/L (ref 15–41)
Albumin: 3.5 g/dL (ref 3.5–5.0)
Alkaline Phosphatase: 91 U/L (ref 38–126)
Anion gap: 10 (ref 5–15)
BUN: 5 mg/dL — ABNORMAL LOW (ref 6–20)
CO2: 24 mmol/L (ref 22–32)
Calcium: 8.4 mg/dL — ABNORMAL LOW (ref 8.9–10.3)
Chloride: 106 mmol/L (ref 98–111)
Creatinine: 0.67 mg/dL (ref 0.61–1.24)
GFR, Estimated: >60 mL/min (ref >60)
Glucose, Bld: 104 mg/dL — ABNORMAL HIGH (ref 70–99)
Potassium: 3.8 mmol/L (ref 3.5–5.1)
Sodium: 140 mmol/L (ref 135–145)
Total Bilirubin: 0.4 mg/dL (ref 0.3–1.2)
Total Protein: 7.4 g/dL (ref 6.5–8.1)

## 2019-12-08 MED ORDER — CAPECITABINE 500 MG PO TABS: 850.0000 mg/m2 | ORAL_TABLET | Freq: Two times a day (BID) | ORAL | 0 refills | Status: DC

## 2019-12-08 NOTE — Telephone Encounter (Addendum)
Oral Chemotherapy Pharmacist Encounter  I spoke with patient and his sister in clinic this AM with an interpreter present for overview of: Xeloda (capecitabine) for the treatment of middle esophageal squamous cell carcinoma cancer in conjunction with infusional oxaliplatin, planned duration of 4 cycles.  Counseled patient on administration, dosing, side effects, monitoring, drug-food interactions, safe handling, storage, and disposal.  Patient will take Xeloda 500mg  tablets, 3 tablets (1500mg ) by mouth in AM and 3 tabs (1500mg ) by mouth in PM, within 30 minutes of finishing meals, on days 1-14 of each 21 day cycle.   Oxaliplatin will be infused on day 1 of each 21 day cycle.  Xeloda and oxaliplatin start date: 12/14/2019  Adverse effects include but are not limited to: fatigue, decreased blood counts, GI upset, diarrhea, mouth sores, and hand-foot syndrome.  Patient has anti-emetic on hand and knows to take it if nausea develops.   Patient will obtain anti diarrheal and alert the office of 4 or more loose stools above baseline.  Reviewed with patient importance of keeping a medication schedule and plan for any missed doses. No barriers to medication adherence identified.  Medication reconciliation performed and medication/allergy list updated.  Patient agreeable to apply for assistance for Xeloda through [place]. Signatures obtained from patient while in clinic.   First fill of Xeloda is being dispensed from [facility]. Patient will pick up medication on 12/13/2019.  All questions answered.  [name] voiced understanding and appreciation.   Medication education handout given to patient. Patient knows to call the office with questions or concerns. Oral Chemotherapy Clinic phone number provided to patient.   Leron Croak, PharmD, BCPS Hematology/Oncology Clinical Pharmacist [facility] [phone] 12/08/2019 4:23  PM

## 2019-12-08 NOTE — Progress Notes (Signed)
DISCONTINUE ON PATHWAY REGIMEN - Gastroesophageal     Administer weekly during RT:     Paclitaxel      Carboplatin   **Always confirm dose/schedule in your pharmacy ordering system**  REASON: Other Reason PRIOR TREATMENT: [id number]: Carboplatin + Paclitaxel (2/50) Weekly (x 5-6 Weeks) with Concurrent RT TREATMENT RESPONSE: Partial Response (PR)  START OFF PATHWAY REGIMEN - Gastroesophageal   [id number]:Capecitabine + Oxaliplatin (1,000/130):   A cycle is every 21 days:     Capecitabine      Oxaliplatin   **Always confirm dose/schedule in your pharmacy ordering system**  Patient Characteristics: Esophageal & GE Junction, Squamous Cell, Preoperative or Nonsurgical Candidate (Clinical Staging), cT2 or Higher or cN+, Unresectable/Nonsurgical Candidate (Any cT) Histology: Squamous Cell Disease Classification: Esophageal Therapeutic Status: Preoperative or Nonsurgical Candidate (Clinical Staging) AJCC M Category: cM0 AJCC 8 Stage Grouping: Unknown AJCC Grade: G2 AJCC Location: Middle AJCC T Category: cTX AJCC N Category: cN1 Intent of Therapy: Curative Intent, Discussed with Patient

## 2019-12-08 NOTE — Telephone Encounter (Signed)
Oral Oncology Pharmacist Encounter  Received new prescription for Xeloda (capecitabine) for the treatment of middle esophageal squamous cell carcinoma in conjunction with oxaliplatin, planned duration 4 cycles.  Prescription dose and frequency assessed for appropriateness. Appropriate for therapy initiation.   CBC w/ Diff and CMP from 12/08/19 assessed, labs stable for Xeloda initiation.  Current medication list in Epic reviewed, DDIs with Xeloda identified:  Category C DDI between Xeloda and Pantoprazole - proton-pump inhibitors can decrease efficacy of Xeloda - appears this was recently restarted on 11/28/19 - will discuss with Dr. Burr Medico possibility of patient switching to an H2RA while taking Xeloda.  Evaluated chart and no patient barriers to medication adherence noted.   Prescription has been e-scribed to the [facility] for benefits analysis and approval.  Oral Oncology Clinic will continue to follow for insurance authorization, copayment issues, initial counseling and start date.  Leron Croak, PharmD, BCPS Hematology/Oncology Clinical Pharmacist [facility] [phone] 12/08/2019 10:55 AM

## 2019-12-09 ENCOUNTER — Telehealth: Payer: Self-pay | Admitting: Hematology

## 2019-12-09 NOTE — Telephone Encounter (Signed)
Oral Oncology Pharmacist Encounter  Met with patient while in clinic to complete application for [employer] Patient Assistance Program in an effort to reduce the patient's out of pocket expense for Xeloda (capecitabine) to $0.  Application completed and faxed to: [place] patient assistance phone number for follow up is: [phone]  This encounter will be updated until final determination.   Leron Croak, PharmD, BCPS Hematology/Oncology Clinical Pharmacist [facility] [phone] 12/09/2019 2:20 PM

## 2019-12-09 NOTE — Telephone Encounter (Signed)
Scheduled per 11/11 los. Used Medical laboratory scientific officer. Unable to reach pt. Left voicemail with appt times and dates.

## 2019-12-12 ENCOUNTER — Other Ambulatory Visit: Payer: Self-pay | Admitting: Hematology

## 2019-12-12 MED ORDER — XELODA 500 MG PO TABS: 850.0000 mg/m2 | ORAL_TABLET | Freq: Two times a day (BID) | ORAL | 0 refills | Status: DC

## 2019-12-12 MED FILL — XELODA 500 MG TABLET: 500 | 21 days supply | Qty: 84 | Fill #0

## 2019-12-13 NOTE — Telephone Encounter (Signed)
Oral Chemotherapy Pharmacist Encounter   Attempted to reach patient with interpreter (pacific interpreters ID [phone]) for follow up on oral medication: Xeloda (capecitabine).   Called to let patient know that after discussion with Dr. Burr Medico he is to stop taking pantoprazole and switch to famotidine while on Xeloda due to concern for decreased efficacy of Xeloda when taken in combination with PPIs, such as pantoprazole.  No answer. Left voicemail for patient to call back.   Leron Croak, PharmD, BCPS Hematology/Oncology Clinical Pharmacist [facility] [phone] 12/13/2019 4:07 PM

## 2019-12-13 NOTE — Progress Notes (Deleted)
[place]   Telephone:(336) [phone] Fax:(336) [phone]   Clinic Follow up Note   Patient Care Team: [name], MD as PCP - General (Hematology) Jonnie Finner, RN as Oncology Nurse Navigator [name], MD as Consulting Physician (Oncology) 12/13/2019  CHIEF COMPLAINT: Follow-up unresectable esophagus cancer  SUMMARY OF ONCOLOGIC HISTORY: Oncology History Overview Note  Cancer Staging Malignant neoplasm of middle third of esophagus ([place]) Staging form: Esophagus - Squamous Cell Carcinoma, AJCC 8th Edition - Clinical: Stage Unknown (cTX, cN1, cM0) - Signed by [name], MD on 08/18/2019    Malignant neoplasm of middle third of esophagus [facility])   Initial Diagnosis   Malignant neoplasm of middle third of esophagus ([place])   08/11/2019 Imaging   CT CAP W contrast 08/11/19 IMPRESSION: 1. Mid to lower esophageal mass along an 8 cm segment, large endoluminal component, strongly favoring esophageal malignancy. Borderline enlarged AP window lymph node. No findings of distant metastatic disease. 2. Trace bilateral pleural effusions. 3. Diffuse hepatic steatosis. 4. Cholelithiasis. 5. Fatty spermatic cords likely due to small bilateral indirect inguinal hernias.   08/11/2019 Procedure   EGD by Dr Carlean Purl  IMPRESSION - Partially obstructing, likely malignant esophageal tumor was found in the upper third of the esophagus and in the middle third of the esophagus. Biopsied. Very large and long - difficult but able to advance scope by this lesion - await CT for better length estimate - Normal stomach. - Normal examined duodenum.   08/11/2019 Initial Biopsy   FINAL MICROSCOPIC DIAGNOSIS:   A. ESOPHAGUS, UPPER, BIOPSY:  - Squamous cell carcinoma.    08/18/2019 Cancer Staging   Staging form: Esophagus - Squamous Cell Carcinoma, AJCC 8th Edition - Clinical: Stage Unknown (cTX, cN1, cM0) - Signed by [name], MD on 08/18/2019   08/29/2019 - 10/04/2019 Chemotherapy    Concurrent chemoradiation with weekly CT for 6 weeks starting 08/29/19-10/04/19    08/29/2019 - 10/06/2019 Radiation Therapy   Concurrent chemoradiation with Dr Lisbeth Renshaw starting 08/29/19-10/06/19   11/11/2019 PET scan   IMPRESSION: 1. Again seen is a long segment of FDG avid wall thickening of the distal half of the thoracic esophagus compatible with known esophageal neoplasm. 2. No signs of FDG avid nodal or distant metastatic disease.     11/28/2019 Procedure   EUS by Dr Rush Landmark IMPRESSION EGD Impression: - No gross lesions in esophagus proximally. - Previously noted esophageal cancer is not present. However, in its place and extending distal to this is likely LA Grade D chemotherapy/radiation esophagitis with bleeding. Biopsied after the EUS was complete to evaluate for persistent disease or not. - Z-line regular, 37 cm from the incisors. - 3 cm hiatal hernia. - Erythematous mucosa in the stomach. No other gross lesions in the stomach. Biopsied. - No gross lesions in the duodenal bulb, in the first portion of the duodenum and in the second portion of the duodenum. EUS Impression: - Wall thickening was seen in the thoracic esophagus into the gastroesophageal junction. The thickening appeared to primarily be within the luminal interface/superficial mucosa (Layer 1) and deep mucosa (Layer 2). The previously noted esophageal cancer has been replaced by what appears to be radiation/chemotherapy induced changes. With the limitations of having to use the mini-probe EUS, I could not visualize persistent significant disease other than the entire distal area of the esophagus having likely changes noted as above. - No malignant-appearing lymph nodes were visualized in the middle paraesophageal mediastinum (level 66M), lower paraesophageal mediastinum (level 8L), diaphragmatic region (level  15) and paracardial region (level 16).      FINAL MICROSCOPIC DIAGNOSIS:   A. STOMACH, BIOPSY:  -  Mildly active chronic gastritis.  - Warthin-Starry negative for Helicobacter pylori.  - No intestinal metaplasia, dysplasia or carcinoma.   B. ESOPHAGUS, BIOPSY:  - Inflamed granulation tissue and exudate consistent with ulcer.  - No malignancy identified.    12/13/2019 -  Chemotherapy   The patient had dexamethasone (DECADRON) 4 MG tablet, 8 mg, Oral, Daily, 0 of 1 cycle, Start date: --, End date: -- capecitabine (XELODA) 500 MG tablet, 850 mg/m2 = 1,500 mg (100 % of original dose 850 mg/m2), Oral, 2 times daily, 0 of 1 cycle, Start date: 12/08/2019, End date: -- Dose modification: 850 mg/m2 (original dose 850 mg/m2, Cycle 0) palonosetron (ALOXI) injection 0.25 mg, 0.25 mg, Intravenous,  Once, 0 of 4 cycles oxaliplatin (ELOXATIN) 245 mg in dextrose 5 % 500 mL chemo infusion, 130 mg/m2 = 245 mg, Intravenous,  Once, 0 of 4 cycles  for chemotherapy treatment.      CURRENT THERAPY: Pending first-line systemic Capox with Xeloda 1500 mg twice daily for day 1-14 q. 21 days to start 12/14/2019  INTERVAL HISTORY: [name] returns for follow-up and to start treatment as scheduled.    REVIEW OF SYSTEMS:   Constitutional: Denies fevers, chills or abnormal weight loss Eyes: Denies blurriness of vision Ears, nose, mouth, throat, and face: Denies mucositis or sore throat Respiratory: Denies cough, dyspnea or wheezes Cardiovascular: Denies palpitation, chest discomfort or lower extremity swelling Gastrointestinal:  Denies nausea, heartburn or change in bowel habits Skin: Denies abnormal skin rashes Lymphatics: Denies new lymphadenopathy or easy bruising Neurological:Denies numbness, tingling or new weaknesses Behavioral/Psych: Mood is stable, no new changes  All other systems were reviewed with the patient and are negative.  MEDICAL HISTORY:  Past Medical History:  Diagnosis Date  . GERD (gastroesophageal reflux disease)   . Hematemesis with nausea 08/10/2019    SURGICAL  HISTORY: Past Surgical History:  Procedure Laterality Date  . BIOPSY  08/11/2019   Procedure: BIOPSY;  Surgeon: [name], MD;  Location[facility]COPY;  Service: Endoscopy;;  . BIOPSY  11/28/2019   Procedure: BIOPSY;  Surgeon: [name], MD;  Location: [place];  Service: Gastroenterology;;  . ESOPHAGOGASTRODUODENOSCOPY  08/11/2019  . ESOPHAGOGASTRODUODENOSCOPY (EGD) WITH PROPOFOL N/A 08/11/2019   Procedure: ESOPHAGOGASTRODUODENOSCOPY (EGD) WITH PROPOFOL;  Surgeon: [name], MD;  Location: [place];  Service: Endoscopy;  Laterality: N/A;  . ESOPHAGOGASTRODUODENOSCOPY (EGD) WITH PROPOFOL N/A 11/28/2019   Procedure: ESOPHAGOGASTRODUODENOSCOPY (EGD) WITH PROPOFOL;  Surgeon: Rush Landmark Telford Nab., MD;  Location: [place];  Service: Gastroenterology;  Laterality: N/A;  . EUS N/A 11/28/2019   Procedure: UPPER ENDOSCOPIC ULTRASOUND (EUS) RADIAL;  Surgeon: Rush Landmark Telford Nab., MD;  Location: [place];  Service: Gastroenterology;  Laterality: N/A;    I have reviewed the social history and family history with the patient and they are unchanged from previous note.  ALLERGIES:  has No Known Allergies.  MEDICATIONS:  Current Outpatient Medications  Medication Sig Dispense Refill  . diphenhydrAMINE-APAP, sleep, (TYLENOL PM EXTRA STRENGTH) 50-1000 MG/30ML LIQD Take 15 mLs by mouth at bedtime as needed (sleep).    . Ferrous Sulfate (IRON) 325 (65 Fe) MG TABS Take 1 tablet (325 mg total) by mouth 2 (two) times daily. 60 tablet 3  . HYDROcodone-acetaminophen (HYCET) 7.5-325 mg/15 ml solution Take 15 mLs by mouth every 6 (six) hours as needed for moderate pain. (Patient taking differently: Take 15  mLs by mouth 2 (two) times daily as needed for moderate pain. ) 473 mL 0  . pantoprazole (PROTONIX) 40 MG tablet Take 1 tablet (40 mg total) by mouth 2 (two) times daily. 60 tablet 6  . sucralfate (CARAFATE) 1 g tablet Take 1 tablet (1 g total) by mouth 4 (four) times  daily. Dissolve each tablet in 15 cc water before use. 120 tablet 2  . XELODA 500 MG tablet Take 3 tablets (1,500 mg total) by mouth 2 (two) times daily after a meal. Take 14 days on, 7 days off, repeat every 21 days. 84 tablet 0   No current facility-administered medications for this visit.    PHYSICAL EXAMINATION: ECOG PERFORMANCE STATUS: {CHL ONC ECOG [phone]}  There were no vitals filed for this visit. There were no vitals filed for this visit.  GENERAL:alert, no distress and comfortable SKIN: skin color, texture, turgor are normal, no rashes or significant lesions EYES: normal, Conjunctiva are pink and non-injected, sclera clear OROPHARYNX:no exudate, no erythema and lips, buccal mucosa, and tongue normal  NECK: supple, thyroid normal size, non-tender, without nodularity LYMPH:  no palpable lymphadenopathy in the cervical, axillary or inguinal LUNGS: clear to auscultation and percussion with normal breathing effort HEART: regular rate & rhythm and no murmurs and no lower extremity edema ABDOMEN:abdomen soft, non-tender and normal bowel sounds Musculoskeletal:no cyanosis of digits and no clubbing  NEURO: alert & oriented x 3 with fluent speech, no focal motor/sensory deficits  LABORATORY DATA:  I have reviewed the data as listed CBC Latest Ref Rng & Units 12/08/2019 10/17/2019 10/04/2019  WBC 4.0 - 10.5 K/uL 4.1 2.3(L) 2.4(L)  Hemoglobin 13.0 - 17.0 g/dL 13.9 10.6(L) 10.6(L)  Hematocrit 39 - 52 % 39.5 31.5(L) 31.4(L)  Platelets 150 - 400 K/uL 173 187 183     CMP Latest Ref Rng & Units 12/08/2019 10/17/2019 10/04/2019  Glucose 70 - 99 mg/dL 104(H) 100(H) 107(H)  BUN 6 - 20 mg/dL 5(L) <4(L) 10  Creatinine 0.61 - 1.24 mg/dL 0.67 0.67 0.62  Sodium 135 - 145 mmol/L 140 135 134(L)  Potassium 3.5 - 5.1 mmol/L 3.8 3.9 3.6  Chloride 98 - 111 mmol/L 106 104 102  CO2 22 - 32 mmol/L 24 25 25   Calcium 8.9 - 10.3 mg/dL 8.4(L) 8.9 9.1  Total Protein 6.5 - 8.1 g/dL 7.4 6.5 6.7  Total  Bilirubin 0.3 - 1.2 mg/dL 0.4 0.4 0.6  Alkaline Phos 38 - 126 U/L 91 74 55  AST 15 - 41 U/L 19 18 22   ALT 0 - 44 U/L 8 11 12       RADIOGRAPHIC STUDIES: I have personally reviewed the radiological images as listed and agreed with the findings in the report. No results found.   ASSESSMENT & PLAN:  No problem-specific Assessment & Plan notes found for this encounter.   No orders of the defined types were placed in this encounter.  All questions were answered. The patient knows to call the clinic with any problems, questions or concerns. No barriers to learning was detected. I spent {CHL ONC TIME VISIT - VQMGQ:[id number]} counseling the patient face to face. The total time spent in the appointment was {CHL ONC TIME VISIT - IZTIW:[id number]} and more than 50% was on counseling and review of test results     Alla Feeling, NP 12/13/19

## 2019-12-14 ENCOUNTER — Inpatient Hospital Stay: Payer: Self-pay

## 2019-12-14 ENCOUNTER — Inpatient Hospital Stay: Payer: Self-pay | Admitting: Nurse Practitioner

## 2019-12-14 NOTE — Telephone Encounter (Signed)
Patient is approved for Xeloda at no charge from [facility] 12/14/19-until notified.  [place] [facility] [place] Phone [phone] Fax [phone] 12/14/2019 3:34 PM

## 2019-12-16 NOTE — Progress Notes (Signed)
[place]   Telephone:(336) [phone] Fax:(336) [phone]   Clinic Follow up Note   Patient Care Team: [name], MD as PCP - General (Hematology) Jonnie Finner, RN as Oncology Nurse Navigator [name], MD as Consulting Physician (Oncology)  Date of Service:  12/20/2019  CHIEF COMPLAINT: F/u ofMiddleEsophageal Squamous CellCarcinoma  SUMMARY OF ONCOLOGIC HISTORY: Oncology History Overview Note  Cancer Staging Malignant neoplasm of middle third of esophagus ([place]) Staging form: Esophagus - Squamous Cell Carcinoma, AJCC 8th Edition - Clinical: Stage Unknown (cTX, cN1, cM0) - Signed by [name], MD on 08/18/2019    Malignant neoplasm of middle third of esophagus [facility])   Initial Diagnosis   Malignant neoplasm of middle third of esophagus ([place])   08/11/2019 Imaging   CT CAP W contrast 08/11/19 IMPRESSION: 1. Mid to lower esophageal mass along an 8 cm segment, large endoluminal component, strongly favoring esophageal malignancy. Borderline enlarged AP window lymph node. No findings of distant metastatic disease. 2. Trace bilateral pleural effusions. 3. Diffuse hepatic steatosis. 4. Cholelithiasis. 5. Fatty spermatic cords likely due to small bilateral indirect inguinal hernias.   08/11/2019 Procedure   EGD by Dr Carlean Purl  IMPRESSION - Partially obstructing, likely malignant esophageal tumor was found in the upper third of the esophagus and in the middle third of the esophagus. Biopsied. Very large and long - difficult but able to advance scope by this lesion - await CT for better length estimate - Normal stomach. - Normal examined duodenum.   08/11/2019 Initial Biopsy   FINAL MICROSCOPIC DIAGNOSIS:   A. ESOPHAGUS, UPPER, BIOPSY:  - Squamous cell carcinoma.    08/18/2019 Cancer Staging   Staging form: Esophagus - Squamous Cell Carcinoma, AJCC 8th Edition - Clinical: Stage Unknown (cTX, cN1, cM0) - Signed by [name], MD on 08/18/2019   08/29/2019 -  10/04/2019 Chemotherapy   Concurrent chemoradiation with weekly CT for 6 weeks starting 08/29/19-10/04/19    08/29/2019 - 10/06/2019 Radiation Therapy   Concurrent chemoradiation with Dr Lisbeth Renshaw starting 08/29/19-10/06/19   11/11/2019 PET scan   IMPRESSION: 1. Again seen is a long segment of FDG avid wall thickening of the distal half of the thoracic esophagus compatible with known esophageal neoplasm. 2. No signs of FDG avid nodal or distant metastatic disease.     11/28/2019 Procedure   EUS by Dr Rush Landmark IMPRESSION EGD Impression: - No gross lesions in esophagus proximally. - Previously noted esophageal cancer is not present. However, in its place and extending distal to this is likely LA Grade D chemotherapy/radiation esophagitis with bleeding. Biopsied after the EUS was complete to evaluate for persistent disease or not. - Z-line regular, 37 cm from the incisors. - 3 cm hiatal hernia. - Erythematous mucosa in the stomach. No other gross lesions in the stomach. Biopsied. - No gross lesions in the duodenal bulb, in the first portion of the duodenum and in the second portion of the duodenum. EUS Impression: - Wall thickening was seen in the thoracic esophagus into the gastroesophageal junction. The thickening appeared to primarily be within the luminal interface/superficial mucosa (Layer 1) and deep mucosa (Layer 2). The previously noted esophageal cancer has been replaced by what appears to be radiation/chemotherapy induced changes. With the limitations of having to use the mini-probe EUS, I could not visualize persistent significant disease other than the entire distal area of the esophagus having likely changes noted as above. - No malignant-appearing lymph nodes were visualized in the middle paraesophageal mediastinum (level 49M), lower paraesophageal mediastinum (  level 8L), diaphragmatic region (level 15) and paracardial region (level 16).      FINAL MICROSCOPIC DIAGNOSIS:   A.  STOMACH, BIOPSY:  - Mildly active chronic gastritis.  - Warthin-Starry negative for Helicobacter pylori.  - No intestinal metaplasia, dysplasia or carcinoma.   B. ESOPHAGUS, BIOPSY:  - Inflamed granulation tissue and exudate consistent with ulcer.  - No malignancy identified.    12/20/2019 -  Chemotherapy   CAPOX q3weeks with Xeloda 156m BID 2 weeks on/1 week off starting 12/20/19      CURRENT THERAPY:  CAPOX q3weeks with Xeloda 1504mBID 2 weeks on/1 week off starting 12/20/19   INTERVAL HISTORY:  Jimmy Hawkins here for a follow up. He presents to the clinic with his Spanish interpretor. He denies any new changes. He notes food will get stuck in throat when he swallows. This occurs with solid foods and can manage soft foods and liquids. He denies current pain or discomfort except leg muscle pain mildly. He started Xeloda today with 100064mn the AM as told to him by the pharmacist.     REVIEW OF SYSTEMS:   Constitutional: Denies fevers, chills or abnormal weight loss Eyes: Denies blurriness of vision Ears, nose, mouth, throat, and face: Denies mucositis or sore throat (+) Odynphagia with solid foods   Respiratory: Denies cough, dyspnea or wheezes Cardiovascular: Denies palpitation, chest discomfort or lower extremity swelling Gastrointestinal:  Denies nausea, heartburn or change in bowel habits Skin: Denies abnormal skin rashes Lymphatics: Denies new lymphadenopathy or easy bruising Neurological:Denies numbness, tingling or new weaknesses Behavioral/Psych: Mood is stable, no new changes  All other systems were reviewed with the patient and are negative.  MEDICAL HISTORY:  Past Medical History:  Diagnosis Date  . GERD (gastroesophageal reflux disease)   . Hematemesis with nausea 08/10/2019    SURGICAL HISTORY: Past Surgical History:  Procedure Laterality Date  . BIOPSY  08/11/2019   Procedure: BIOPSY;  Surgeon: GesGatha MayerD;  [facility];  Service: Endoscopy;;  . BIOPSY  11/28/2019   Procedure: BIOPSY;  Surgeon: ManIrving CopasMD;  [facility] [place];;  . ESOPHAGOGASTRODUODENOSCOPY  08/11/2019  . ESOPHAGOGASTRODUODENOSCOPY (EGD) WITH PROPOFOL N/A 08/11/2019   Procedure: ESOPHAGOGASTRODUODENOSCOPY (EGD) WITH PROPOFOL;  Surgeon: GesGatha MayerD;  [facility] [place];  Laterality: N/A;  . ESOPHAGOGASTRODUODENOSCOPY (EGD) WITH PROPOFOL N/A 11/28/2019   Procedure: ESOPHAGOGASTRODUODENOSCOPY (EGD) WITH PROPOFOL;  Surgeon: ManRush LandmarkbTelford NabMD;  [facility] [place];  Laterality: N/A;  . EUS N/A 11/28/2019   Procedure: UPPER ENDOSCOPIC ULTRASOUND (EUS) RADIAL;  Surgeon: ManRush LandmarkbTelford NabMD;  [facility] [place];  Laterality: N/A;    I have reviewed the social history and family history with the patient and they are unchanged from previous note.  ALLERGIES:  has No Known Allergies.  MEDICATIONS:  Current Outpatient Medications  Medication Sig Dispense Refill  . dexamethasone (DECADRON) 4 MG tablet Take 2 tablets (8 mg total) by mouth daily. Start the day after chemotherapy for 2 days. Take with food. 30 tablet 1  . diphenhydrAMINE-APAP, sleep, (TYLENOL PM EXTRA STRENGTH) 50-1000 MG/30ML LIQD Take 15 mLs by mouth at bedtime as needed (sleep).    . famotidine (PEPCID) 20 MG tablet Take 1 tablet (20 mg total) by mouth 2 (two) times daily. 60 tablet 3  . Ferrous Sulfate (IRON) 325 (65 Fe) MG TABS Take 1 tablet (325 mg total) by mouth 2 (two)  times daily. 60 tablet 3  . HYDROcodone-acetaminophen (HYCET) 7.5-325 mg/15 ml solution Take 15 mLs by mouth every 6 (six) hours as needed for moderate pain. (Patient taking differently: Take 15 mLs by mouth 2 (two) times daily as needed for moderate pain. ) 473 mL 0  . lidocaine-prilocaine (EMLA) cream Apply to affected area once 30 g 3  . LORazepam  (ATIVAN) 0.5 MG tablet Take 1 tablet (0.5 mg total) by mouth every 6 (six) hours as needed (Nausea or vomiting). 30 tablet 0  . ondansetron (ZOFRAN) 8 MG tablet Take 1 tablet (8 mg total) by mouth 2 (two) times daily as needed for refractory nausea / vomiting. Start on day 3 after chemotherapy. 30 tablet 1  . prochlorperazine (COMPAZINE) 10 MG tablet Take 1 tablet (10 mg total) by mouth every 6 (six) hours as needed (Nausea or vomiting). 30 tablet 1  . sucralfate (CARAFATE) 1 g tablet Take 1 tablet (1 g total) by mouth 4 (four) times daily. Dissolve each tablet in 15 cc water before use. 120 tablet 2  . XELODA 500 MG tablet Take 3 tablets (1,500 mg total) by mouth 2 (two) times daily after a meal. Take 14 days on, 7 days off, repeat every 21 days. 84 tablet 0   No current facility-administered medications for this visit.    PHYSICAL EXAMINATION: ECOG PERFORMANCE STATUS: 1 - Symptomatic but completely ambulatory  Vitals:   12/20/19 0926  BP: 132/81  Pulse: 98  Resp: 17  Temp: 97.6 F (36.4 C)  SpO2: 100%   Filed Weights   12/20/19 0926  Weight: 169 lb 4.8 oz (76.8 kg)    Due to COVID19 we will limit examination to appearance. Patient had no complaints.  GENERAL:alert, no distress and comfortable SKIN: skin color normal, no rashes or significant lesions EYES: normal, Conjunctiva are pink and non-injected, sclera clear  NEURO: alert & oriented x 3 with fluent speech   LABORATORY DATA:  I have reviewed the data as listed CBC Latest Ref Rng & Units 12/20/2019 12/08/2019 10/17/2019  WBC 4.0 - 10.5 K/uL 3.9(L) 4.1 2.3(L)  Hemoglobin 13.0 - 17.0 g/dL 14.3 13.9 10.6(L)  Hematocrit 39 - 52 % 41.8 39.5 31.5(L)  Platelets 150 - 400 K/uL 156 173 187     CMP Latest Ref Rng & Units 12/20/2019 12/08/2019 10/17/2019  Glucose 70 - 99 mg/dL 117(H) 104(H) 100(H)  BUN 6 - 20 mg/dL 7 5(L) <4(L)  Creatinine 0.61 - 1.24 mg/dL 0.71 0.67 0.67  Sodium 135 - 145 mmol/L 139 140 135  Potassium 3.5 -  5.1 mmol/L 3.7 3.8 3.9  Chloride 98 - 111 mmol/L 105 106 104  CO2 22 - 32 mmol/L _0 Calcium 8.9 - 10.3 mg/dL 9.1 8.4(L) 8.9  Total Protein 6.5 - 8.1 g/dL 7.9 7.4 6.5  Total Bilirubin 0.3 - 1.2 mg/dL 0.6 0.4 0.4  Alkaline Phos 38 - 126 U/L 80 91 74  AST 15 - 41 U/L _1 ALT 0 - 44 U/L _2 RADIOGRAPHIC STUDIES: I have personally reviewed the radiological images as listed and agreed with the findings in the report. No results found.   ASSESSMENT & PLAN:  Jimmy Hawkins is a 44 y.o. male with   1.[name], cTxN1M0 -Hewas diagnosed in 07/2019 withpresented withSquamous Cell Carcinoma as seen on EGD.08/11/19 CT CAP found him to havemidesophageal mass and enlarged AP window node, no distant metastasis.  -Ipreviouslydiscussed standard treatment  includes neoadjuvant concurrent chemoradiation, followed by esophagectomy, and adjuvant immunotherapy. -He completed 6 weeks of concurrent chemoRT withweekly Carboplatin and Taxol 08/29/19-10/06/19 -Repeat PET scan October 2021 showed hypermetabolic wall thickening of the distal half of the thoracic esophageus, no notable distant metastasis. Dr. Kipp Brood has reviewed the PET scan, and feels that his tumor invades trachea is it's not resectable. He will f/u with Dr Kipp Brood on 01/06/20. -EUS on 11/28/2019 showed thickening of esophageal wall at tumor site, no visible tumor, biopsy of the mucosa was negative for malignant cells. -I recommend consolidation chemotherapy with CAPOX q3weeks with Xeloda 1540m BID 2 weeks on/1 week off starting 12/20/19. He started Xeloda today with 10017mBID. I reviewed with him the correct dosage is 150084mID.  -Labs reviewed, CBC and CMP WNL except WBC 3.9, BG 117. Overall adequate to proceed with first cycle of Oxaliplatin today. I reviewed side effects to watch for.  -F/u next week for toxicity check. He can contact clinic sooner with any concerning  side effects or questions. -pt is interested a second surgical opinion[facility] MeyOlen Pel2.Moderate anemia secondary to GI bleeding and probable iron deficiency -He has hadrecurrentNausea and vomiting withhematemesissince 01/2019.  -He required blood transfusion on 08/15/19 and IV Feraheme on 7/16/21and 08/22/19. -Continue oral iron. -anemia resolved. Will monitor on consolidation chemo.   3.H/o Heavy alcohol user. He quit drinkingsince cancer diagnosis.   5. Social and Financial Support -Patient does not have insurance.He is also undocumented. -He has met withfinancial advocate Shauna and SWaLoreli Dollar has grant assistance with medications. Ipreviouslydiscussed for grant use [facility]   PLAN: -Start Xeloda 1500m40mD today and continue 2 weeks on/1 week off  -Labs reviewed and adequate to proceed with first cycle Oxaliplatin today -Lab and F/u next week for toxicity check  -Lab, f/u and Oxaliplatin in 3 weeks  -[facility]    No problem-specific Assessment & Plan notes found for this encounter.   No orders of the defined types were placed in this encounter.  All questions were answered. The patient knows to call the clinic with any problems, questions or concerns. No barriers to learning was detected. The total time spent in the appointment was 30 minutes.     Leonia Heatherly [name] 12/20/2019   I, AmoyJoslyn Devon acting as scribe for Anieya Helman [name].   I have reviewed the above documentation for accuracy and completeness, and I agree with the above.

## 2019-12-20 ENCOUNTER — Inpatient Hospital Stay: Payer: Self-pay

## 2019-12-20 ENCOUNTER — Inpatient Hospital Stay (HOSPITAL_BASED_OUTPATIENT_CLINIC_OR_DEPARTMENT_OTHER): Payer: Self-pay | Admitting: Hematology

## 2019-12-20 ENCOUNTER — Other Ambulatory Visit: Payer: Self-pay

## 2019-12-20 ENCOUNTER — Encounter: Payer: Self-pay | Admitting: Hematology

## 2019-12-20 ENCOUNTER — Other Ambulatory Visit: Payer: Self-pay | Admitting: Hematology

## 2019-12-20 VITALS — BP 132/81 | HR 98 | Temp 97.6°F | Resp 17 | Ht 66.0 in | Wt 169.3 lb

## 2019-12-20 DIAGNOSIS — C154 Malignant neoplasm of middle third of esophagus: Secondary | ICD-10-CM

## 2019-12-20 DIAGNOSIS — K219 Gastro-esophageal reflux disease without esophagitis: Secondary | ICD-10-CM

## 2019-12-20 LAB — CBC WITH DIFFERENTIAL (CANCER CENTER ONLY)
Abs Immature Granulocytes: 0.03 10*3/uL (ref 0.00–0.07)
Basophils Absolute: 0 10*3/uL (ref 0.0–0.1)
Basophils Relative: 0 %
Eosinophils Absolute: 0.1 10*3/uL (ref 0.0–0.5)
Eosinophils Relative: 3 %
HCT: 41.8 % (ref 39.0–52.0)
Hemoglobin: 14.3 g/dL (ref 13.0–17.0)
Immature Granulocytes: 1 %
Lymphocytes Relative: 19 %
Lymphs Abs: 0.8 10*3/uL (ref 0.7–4.0)
MCH: 31.8 pg (ref 26.0–34.0)
MCHC: 34.2 g/dL (ref 30.0–36.0)
MCV: 92.9 fL (ref 80.0–100.0)
Monocytes Absolute: 0.8 10*3/uL (ref 0.1–1.0)
Monocytes Relative: 19 %
Neutro Abs: 2.2 10*3/uL (ref 1.7–7.7)
Neutrophils Relative %: 58 %
Platelet Count: 156 10*3/uL (ref 150–400)
RBC: 4.5 MIL/uL (ref 4.22–5.81)
RDW: 12.2 % (ref 11.5–15.5)
WBC Count: 3.9 10*3/uL — ABNORMAL LOW (ref 4.0–10.5)
nRBC: 0 % (ref 0.0–0.2)

## 2019-12-20 LAB — CMP (CANCER CENTER ONLY)
ALT: 17 U/L (ref 0–44)
AST: 26 U/L (ref 15–41)
Albumin: 3.8 g/dL (ref 3.5–5.0)
Alkaline Phosphatase: 80 U/L (ref 38–126)
Anion gap: 11 (ref 5–15)
BUN: 7 mg/dL (ref 6–20)
CO2: 23 mmol/L (ref 22–32)
Calcium: 9.1 mg/dL (ref 8.9–10.3)
Chloride: 105 mmol/L (ref 98–111)
Creatinine: 0.71 mg/dL (ref 0.61–1.24)
GFR, Estimated: >60 mL/min (ref >60)
Glucose, Bld: 117 mg/dL — ABNORMAL HIGH (ref 70–99)
Potassium: 3.7 mmol/L (ref 3.5–5.1)
Sodium: 139 mmol/L (ref 135–145)
Total Bilirubin: 0.6 mg/dL (ref 0.3–1.2)
Total Protein: 7.9 g/dL (ref 6.5–8.1)

## 2019-12-20 MED ORDER — ONDANSETRON HCL 8 MG PO TABS: 8.0000 mg | ORAL_TABLET | Freq: Two times a day (BID) | ORAL | 1 refills | Status: DC | PRN

## 2019-12-20 MED ORDER — PALONOSETRON HCL INJECTION 0.25 MG/5ML
0.2500 mg | Freq: Once | INTRAVENOUS | Status: AC
  Administered 2019-12-20: 0.25 mg via INTRAVENOUS

## 2019-12-20 MED ORDER — DEXAMETHASONE 4 MG PO TABS: 8.0000 mg | ORAL_TABLET | Freq: Every day | ORAL | 1 refills | Status: DC

## 2019-12-20 MED ORDER — DEXTROSE 5 % IV SOLN
Freq: Once | INTRAVENOUS | Status: AC
  Filled 2019-12-20: qty 250

## 2019-12-20 MED ORDER — SODIUM CHLORIDE 0.9 % IV SOLN
10.0000 mg | Freq: Once | INTRAVENOUS | Status: AC
  Administered 2019-12-20: 10 mg via INTRAVENOUS
  Filled 2019-12-20: qty 10

## 2019-12-20 MED ORDER — OXALIPLATIN CHEMO INJECTION 100 MG/20ML
130.0000 mg/m2 | Freq: Once | INTRAVENOUS | Status: AC
  Administered 2019-12-20: 245 mg via INTRAVENOUS
  Filled 2019-12-20: qty 49

## 2019-12-20 MED ORDER — PROCHLORPERAZINE MALEATE 10 MG PO TABS: 10.0000 mg | ORAL_TABLET | Freq: Four times a day (QID) | ORAL | 1 refills | Status: DC | PRN

## 2019-12-20 MED ORDER — LIDOCAINE-PRILOCAINE 2.5-2.5 % EX CREA: TOPICAL_CREAM | CUTANEOUS | 3 refills | Status: DC

## 2019-12-20 MED ORDER — PALONOSETRON HCL INJECTION 0.25 MG/5ML
INTRAVENOUS | Status: AC
  Filled 2019-12-20: qty 5

## 2019-12-20 MED ORDER — FAMOTIDINE 20 MG PO TABS: 20.0000 mg | ORAL_TABLET | Freq: Two times a day (BID) | ORAL | 3 refills | Status: DC

## 2019-12-20 MED ORDER — LORAZEPAM 0.5 MG PO TABS: 0.5000 mg | ORAL_TABLET | Freq: Four times a day (QID) | ORAL | 0 refills | Status: DC | PRN

## 2019-12-20 MED FILL — FAMOTIDINE 20 MG TABLET: 20 | 30 days supply | Qty: 60 | Fill #0

## 2019-12-20 NOTE — Patient Instructions (Signed)
[facility] [place] Discharge Instructions for Patients Receiving Chemotherapy  Today you received the following chemotherapy agents OXALIPLATIN  To help prevent nausea and vomiting after your treatment, we encourage you to take your nausea medication as prescribed by MD. **DO NOT TAKE ZOFRAN FOR 3 DAYS AFTER CHEMOTHERAPY**   If you develop nausea and vomiting that is not controlled by your nausea medication, call the clinic.   BELOW ARE SYMPTOMS THAT SHOULD BE REPORTED IMMEDIATELY:  *FEVER GREATER THAN 100.5 F  *CHILLS WITH OR WITHOUT FEVER  NAUSEA AND VOMITING THAT IS NOT CONTROLLED WITH YOUR NAUSEA MEDICATION  *UNUSUAL SHORTNESS OF BREATH  *UNUSUAL BRUISING OR BLEEDING  TENDERNESS IN MOUTH AND THROAT WITH OR WITHOUT PRESENCE OF ULCERS  *URINARY PROBLEMS  *BOWEL PROBLEMS  UNUSUAL RASH Items with * indicate a potential emergency and should be followed up as soon as possible.  Feel free to call the clinic should you have any questions or concerns. The clinic phone number is [phone].  Please show the [facility] at check-in to the Emergency Department and triage nurse.

## 2019-12-23 ENCOUNTER — Other Ambulatory Visit: Payer: Self-pay

## 2019-12-23 DIAGNOSIS — C154 Malignant neoplasm of middle third of esophagus: Secondary | ICD-10-CM

## 2019-12-27 NOTE — Progress Notes (Signed)
[facility]   Telephone:([phone] [phone]) [phone]   Clinic Follow up Note   Patient Care Team: [name], MD as PCP - General (Hematology) Jonnie Finner, RN as Oncology Nurse Navigator [name], MD as Consulting Physician (Oncology) 12/28/2019  CHIEF COMPLAINT: Follow-up esophagus cancer  SUMMARY OF ONCOLOGIC HISTORY: Oncology History Overview Note  Cancer Staging Malignant neoplasm of middle third of esophagus ([place]) Staging form: Esophagus - Squamous Cell Carcinoma, AJCC 8th Edition - Clinical: Stage Unknown (cTX, cN1, cM0) - Signed by [name], MD on 08/18/2019    Malignant neoplasm of middle third of esophagus [facility])   Initial Diagnosis   Malignant neoplasm of middle third of esophagus ([place])   08/11/2019 Imaging   CT CAP W contrast 08/11/19 IMPRESSION: 1. Mid to lower esophageal mass along an 8 cm segment, large endoluminal component, strongly favoring esophageal malignancy. Borderline enlarged AP window lymph node. No findings of distant metastatic disease. 2. Trace bilateral pleural effusions. 3. Diffuse hepatic steatosis. 4. Cholelithiasis. 5. Fatty spermatic cords likely due to small bilateral indirect inguinal hernias.   08/11/2019 Procedure   EGD by Dr Carlean Purl  IMPRESSION - Partially obstructing, likely malignant esophageal tumor was found in the upper third of the esophagus and in the middle third of the esophagus. Biopsied. Very large and long - difficult but able to advance scope by this lesion - await CT for better length estimate - Normal stomach. - Normal examined duodenum.   08/11/2019 Initial Biopsy   FINAL MICROSCOPIC DIAGNOSIS:   A. ESOPHAGUS, UPPER, BIOPSY:  - Squamous cell carcinoma.    08/18/2019 Cancer Staging   Staging form: Esophagus - Squamous Cell Carcinoma, AJCC 8th Edition - Clinical: Stage Unknown (cTX, cN1, cM0) - Signed by [name], MD on 08/18/2019   08/29/2019 - 10/04/2019 Chemotherapy   Concurrent  chemoradiation with weekly CT for 6 weeks starting 08/29/19-10/04/19    08/29/2019 - 10/06/2019 Radiation Therapy   Concurrent chemoradiation with Dr Lisbeth Renshaw starting 08/29/19-10/06/19   11/11/2019 PET scan   IMPRESSION: 1. Again seen is a long segment of FDG avid wall thickening of the distal half of the thoracic esophagus compatible with known esophageal neoplasm. 2. No signs of FDG avid nodal or distant metastatic disease.     11/28/2019 Procedure   EUS by Dr Rush Landmark IMPRESSION EGD Impression: - No gross lesions in esophagus proximally. - Previously noted esophageal cancer is not present. However, in its place and extending distal to this is likely LA Grade D chemotherapy/radiation esophagitis with bleeding. Biopsied after the EUS was complete to evaluate for persistent disease or not. - Z-line regular, 37 cm from the incisors. - 3 cm hiatal hernia. - Erythematous mucosa in the stomach. No other gross lesions in the stomach. Biopsied. - No gross lesions in the duodenal bulb, in the first portion of the duodenum and in the second portion of the duodenum. EUS Impression: - Wall thickening was seen in the thoracic esophagus into the gastroesophageal junction. The thickening appeared to primarily be within the luminal interface/superficial mucosa (Layer 1) and deep mucosa (Layer 2). The previously noted esophageal cancer has been replaced by what appears to be radiation/chemotherapy induced changes. With the limitations of having to use the mini-probe EUS, I could not visualize persistent significant disease other than the entire distal area of the esophagus having likely changes noted as above. - No malignant-appearing lymph nodes were visualized in the middle paraesophageal mediastinum (level 63M), lower paraesophageal mediastinum (level 8L), diaphragmatic region (level 15)  and paracardial region (level 16).      FINAL MICROSCOPIC DIAGNOSIS:   A. STOMACH, BIOPSY:  - Mildly active  chronic gastritis.  - Warthin-Starry negative for Helicobacter pylori.  - No intestinal metaplasia, dysplasia or carcinoma.   B. ESOPHAGUS, BIOPSY:  - Inflamed granulation tissue and exudate consistent with ulcer.  - No malignancy identified.    12/20/2019 -  Chemotherapy   CAPOX q3weeks with Xeloda 1538m BID 2 weeks on/1 week off starting 12/20/19     CURRENT THERAPY: Consolidation Capox starting 11/23  INTERVAL HISTORY: [name] for toxicity check as scheduled.  He began cycle 1 CAPOX with Xeloda 1500 mg twice daily on 11/23.  He has cold sensitivity to his hands and mouth.  Denies mucositis.  Eating and drinking well.  He has mild dysphagia but tolerating soft and solids, denies odynophagia.  His coughing after eating has significantly improved after initial treatment.  He has been resting a lot since receiving chemo but does not feel severely fatigued.  He has upper/mid back pain that was present during radiation that resolved initially and came back this week.  He thinks this is posture related.  Denies injury.  Denies other pain or epigastric pain that was present at diagnosis.  Denies hand-foot syndrome, fever, chills, cough, chest pain, dyspnea, constipation/diarrhea or other new concerns.   MEDICAL HISTORY:  Past Medical History:  Diagnosis Date  . GERD (gastroesophageal reflux disease)   . Hematemesis with nausea 08/10/2019    SURGICAL HISTORY: Past Surgical History:  Procedure Laterality Date  . BIOPSY  08/11/2019   Procedure: BIOPSY;  Surgeon: GGatha Mayer MD;  [facility]  Service: Endoscopy;;  . BIOPSY  11/28/2019   Procedure: BIOPSY;  Surgeon: MIrving Copas, MD;  [facility]  Service: Gastroenterology;;  . ESOPHAGOGASTRODUODENOSCOPY  08/11/2019  . ESOPHAGOGASTRODUODENOSCOPY (EGD) WITH PROPOFOL N/A 08/11/2019   Procedure: ESOPHAGOGASTRODUODENOSCOPY (EGD) WITH PROPOFOL;  Surgeon: GGatha Mayer MD;  [place]  Service: Endoscopy;  Laterality: N/A;  . ESOPHAGOGASTRODUODENOSCOPY (EGD) WITH PROPOFOL N/A 11/28/2019   Procedure: ESOPHAGOGASTRODUODENOSCOPY (EGD) WITH PROPOFOL;  Surgeon: MRush LandmarkGTelford Nab, MD;  [facility]  Service: Gastroenterology;  Laterality: N/A;  . EUS N/A 11/28/2019   Procedure: UPPER ENDOSCOPIC ULTRASOUND (EUS) RADIAL;  Surgeon: MRush LandmarkGTelford Nab, MD;  [facility]  Service: Gastroenterology;  Laterality: N/A;    I have reviewed the social history and family history with the patient and they are unchanged from previous note.  ALLERGIES:  has No Known Allergies.  MEDICATIONS:  Current Outpatient Medications  Medication Sig Dispense Refill  . dexamethasone (DECADRON) 4 MG tablet Take 2 tablets (8 mg total) by mouth daily. Start the day after chemotherapy for 2 days. Take with food. 30 tablet 1  . diphenhydrAMINE-APAP, sleep, (TYLENOL PM EXTRA STRENGTH) 50-1000 MG/30ML LIQD Take 15 mLs by mouth at bedtime as needed (sleep).    . famotidine (PEPCID) 20 MG tablet Take 1 tablet (20 mg total) by mouth 2 (two) times daily. 60 tablet 3  . Ferrous Sulfate (IRON) 325 (65 Fe) MG TABS Take 1 tablet (325 mg total) by mouth 2 (two) times daily. 60 tablet 3  . HYDROcodone-acetaminophen (HYCET) 7.5-325 mg/15 ml solution Take 15 mLs by mouth every 6 (six) hours as needed for moderate pain. (Patient taking differently: Take 15 mLs by mouth 2 (two) times daily as needed for moderate pain. ) 473 mL 0  . lidocaine-prilocaine (EMLA) cream Apply to affected area once  30 g 3  . LORazepam (ATIVAN) 0.5 MG tablet Take 1 tablet (0.5 mg total) by mouth every 6 (six) hours as needed (Nausea or vomiting). 30 tablet 0  . ondansetron (ZOFRAN) 8 MG tablet Take 1 tablet (8 mg total) by mouth 2 (two) times daily as needed for refractory nausea / vomiting. Start on day 3 after chemotherapy. 30 tablet 1  . prochlorperazine (COMPAZINE) 10 MG tablet Take 1 tablet (10 mg total)  by mouth every 6 (six) hours as needed (Nausea or vomiting). 30 tablet 1  . sucralfate (CARAFATE) 1 g tablet Take 1 tablet (1 g total) by mouth 4 (four) times daily. Dissolve each tablet in 15 cc water before use. 120 tablet 2  . XELODA 500 MG tablet Take 3 tablets (1,500 mg total) by mouth 2 (two) times daily after a meal. Take 14 days on, 7 days off, repeat every 21 days. 84 tablet 0   No current facility-administered medications for this visit.    PHYSICAL EXAMINATION: ECOG PERFORMANCE STATUS: 1 - Symptomatic but completely ambulatory  Vitals:   12/28/19 1427  BP: 130/86  Pulse: 94  Resp: 20  Temp: 97.8 F (36.6 C)  SpO2: 100%   Filed Weights   12/28/19 1427  Weight: 169 lb 4.8 oz (76.8 kg)    GENERAL:alert, no distress and comfortable SKIN: No rash.  Palms without erythema EYES: sclera clear LUNGS:  normal breathing effort HEART: no lower extremity edema Musculoskeletal: Mild focal tenderness to thoracic spine NEURO: alert & oriented x 3 with fluent speech, no focal motor/sensory deficits  LABORATORY DATA:  I have reviewed the data as listed CBC Latest Ref Rng & Units 12/28/2019 12/20/2019 12/08/2019  WBC 4.0 - 10.5 K/uL 4.8 3.9(L) 4.1  Hemoglobin 13.0 - 17.0 g/dL 14.6 14.3 13.9  Hematocrit 39 - 52 % 42.0 41.8 39.5  Platelets 150 - 400 K/uL 223 156 173     CMP Latest Ref Rng & Units 12/28/2019 12/20/2019 12/08/2019  Glucose 70 - 99 mg/dL 96 117(H) 104(H)  BUN 6 - 20 mg/dL 5(L) 7 5(L)  Creatinine 0.61 - 1.24 mg/dL 0.67 0.71 0.67  Sodium 135 - 145 mmol/L 135 139 140  Potassium 3.5 - 5.1 mmol/L 3.9 3.7 3.8  Chloride 98 - 111 mmol/L 102 105 106  CO2 22 - 32 mmol/L '25 23 24  ' Calcium 8.9 - 10.3 mg/dL 9.2 9.1 8.4(L)  Total Protein 6.5 - 8.1 g/dL 8.0 7.9 7.4  Total Bilirubin 0.3 - 1.2 mg/dL 0.4 0.6 0.4  Alkaline Phos 38 - 126 U/L 98 80 91  AST 15 - 41 U/L '21 26 19  ' ALT 0 - 44 U/L '11 17 8      ' RADIOGRAPHIC STUDIES: I have personally reviewed the radiological  images as listed and agreed with the findings in the report. No results found.   ASSESSMENT & PLAN: Jimmy Heppler Hernandezis a 44 y.o.malewith   1.[name], cTxN1M0 -Diagnosed in 07/2019, he presented with6 months of intermittenthematemesisand progressive dysphagia and upper abdominal pain.08/11/19 CT CAP found him to havemidesophageal mass and enlarged AP window node, no distant metastasis. EGD on 7/15/21biopsyshowed Squamous Cell Carcinoma.PET scan not done due to self-pay -Completed neoadjuvantccRTper Dr Lisbeth Renshaw with weekly carbo/Taxol on 08/29/2019 - 10/06/19.  Dysphagia improved, epigastric pain resolved. -Repeat PET 10/2019 showed hypermetabolic wall thickening of the distal thoracic esophagus, no notable distant metastasis.  Dr. Kipp Brood felt he was not a surgical candidate as the tumor invades the trachea.  Follow-up with Dr. Kipp Brood  12/10. -He has a second opinion consult pending with Dr. Olen Pel on 12/8 -He began consolidation chemotherapy with Capox with Xeloda 1500 mg twice daily 2 weeks on and 1 week off starting 11/23.  Tolerating well  4.Moderate anemia secondary to GI bleeding and probable iron deficiency -He has hadrecurrentNausea and vomiting withhematemesissince 01/2019.  -He required blood transfusion on 08/15/19 and IV Feraheme on 08/12/19. -Continue oral iron. 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 -Met withfinancial advocate Shauna and SWto discuss grant/assistance  Disposition: [name] appears stable.  He is s/p cycle 1 day 9 Capox with Xeloda 1500 mg twice daily.  He is tolerating treatment well with cold sensitivity.  He has stable mild dysphagia, no odynophagia, tolerating mostly solid diet.  Weight is stable.  He is able to recover and function  well.  He has recurrent thoracic back pain that was present during radiation but came back in the last week.  Possibly from radiation changes versus low activity and more rest lately.  I encouraged him to increase his activity level.  He can wear a posture support device/brace.  He can use Tylenol or NSAIDs as needed and heat.  If pain worsens or persists we will proceed with imaging.  CBC and CMP reviewed.  He will continue Xeloda 1500 mg twice daily to complete 14 days, then off 7 days.  Proceed with second surgical opinion at [facility] with Dr. Olen Pel on 12/8.  Return for follow-up and cycle 2 [place] ox on 12/14.  All questions were answered. The patient knows to call the clinic with any problems, questions or concerns. No barriers to learning were detected.     Alla Feeling, NP 12/28/19

## 2019-12-28 ENCOUNTER — Inpatient Hospital Stay: Payer: Self-pay | Attending: Hematology | Admitting: Nurse Practitioner

## 2019-12-28 ENCOUNTER — Telehealth: Payer: Self-pay | Admitting: Nurse Practitioner

## 2019-12-28 ENCOUNTER — Other Ambulatory Visit: Payer: Self-pay

## 2019-12-28 ENCOUNTER — Inpatient Hospital Stay: Payer: Self-pay

## 2019-12-28 ENCOUNTER — Encounter: Payer: Self-pay | Admitting: Nurse Practitioner

## 2019-12-28 VITALS — BP 130/86 | HR 94 | Temp 97.8°F | Resp 20 | Ht 66.0 in | Wt 169.3 lb

## 2019-12-28 DIAGNOSIS — C154 Malignant neoplasm of middle third of esophagus: Secondary | ICD-10-CM

## 2019-12-28 DIAGNOSIS — Z5111 Encounter for antineoplastic chemotherapy: Secondary | ICD-10-CM | POA: Insufficient documentation

## 2019-12-28 DIAGNOSIS — F1011 Alcohol abuse, in remission: Secondary | ICD-10-CM | POA: Insufficient documentation

## 2019-12-28 DIAGNOSIS — D500 Iron deficiency anemia secondary to blood loss (chronic): Secondary | ICD-10-CM | POA: Insufficient documentation

## 2019-12-28 LAB — CBC WITH DIFFERENTIAL (CANCER CENTER ONLY)
Abs Immature Granulocytes: 0.04 10*3/uL (ref 0.00–0.07)
Basophils Absolute: 0 10*3/uL (ref 0.0–0.1)
Basophils Relative: 0 %
Eosinophils Absolute: 0 10*3/uL (ref 0.0–0.5)
Eosinophils Relative: 1 %
HCT: 42 % (ref 39.0–52.0)
Hemoglobin: 14.6 g/dL (ref 13.0–17.0)
Immature Granulocytes: 1 %
Lymphocytes Relative: 21 %
Lymphs Abs: 1 10*3/uL (ref 0.7–4.0)
MCH: 31.9 pg (ref 26.0–34.0)
MCHC: 34.8 g/dL (ref 30.0–36.0)
MCV: 91.7 fL (ref 80.0–100.0)
Monocytes Absolute: 0.7 10*3/uL (ref 0.1–1.0)
Monocytes Relative: 14 %
Neutro Abs: 3 10*3/uL (ref 1.7–7.7)
Neutrophils Relative %: 63 %
Platelet Count: 223 10*3/uL (ref 150–400)
RBC: 4.58 MIL/uL (ref 4.22–5.81)
RDW: 12.4 % (ref 11.5–15.5)
WBC Count: 4.8 10*3/uL (ref 4.0–10.5)
nRBC: 0 % (ref 0.0–0.2)

## 2019-12-28 LAB — CMP (CANCER CENTER ONLY)
ALT: 11 U/L (ref 0–44)
AST: 21 U/L (ref 15–41)
Albumin: 3.8 g/dL (ref 3.5–5.0)
Alkaline Phosphatase: 98 U/L (ref 38–126)
Anion gap: 8 (ref 5–15)
BUN: 5 mg/dL — ABNORMAL LOW (ref 6–20)
CO2: 25 mmol/L (ref 22–32)
Calcium: 9.2 mg/dL (ref 8.9–10.3)
Chloride: 102 mmol/L (ref 98–111)
Creatinine: 0.67 mg/dL (ref 0.61–1.24)
GFR, Estimated: >60 mL/min (ref >60)
Glucose, Bld: 96 mg/dL (ref 70–99)
Potassium: 3.9 mmol/L (ref 3.5–5.1)
Sodium: 135 mmol/L (ref 135–145)
Total Bilirubin: 0.4 mg/dL (ref 0.3–1.2)
Total Protein: 8 g/dL (ref 6.5–8.1)

## 2019-12-28 NOTE — Telephone Encounter (Signed)
Scheduled per los. Gave avs and calendar  

## 2020-01-03 ENCOUNTER — Ambulatory Visit: Payer: Self-pay

## 2020-01-03 ENCOUNTER — Ambulatory Visit: Payer: Self-pay | Admitting: Nurse Practitioner

## 2020-01-03 ENCOUNTER — Other Ambulatory Visit: Payer: Self-pay

## 2020-01-06 ENCOUNTER — Encounter: Payer: Self-pay | Admitting: Thoracic Surgery (Cardiothoracic Vascular Surgery)

## 2020-01-06 ENCOUNTER — Ambulatory Visit (INDEPENDENT_AMBULATORY_CARE_PROVIDER_SITE_OTHER): Payer: Self-pay | Admitting: Thoracic Surgery (Cardiothoracic Vascular Surgery)

## 2020-01-06 ENCOUNTER — Other Ambulatory Visit: Payer: Self-pay

## 2020-01-06 VITALS — BP 138/86 | HR 95 | Resp 18 | Ht 66.0 in | Wt 168.0 lb

## 2020-01-06 DIAGNOSIS — C159 Malignant neoplasm of esophagus, unspecified: Secondary | ICD-10-CM

## 2020-01-06 NOTE — Progress Notes (Signed)
     [address]       [place] [address]             [phone]        [name] comes to discuss results of his PET/CT.  I explained to him that based off of the avidity that was noted at the level of his carina, I am concerned that he may have invasion into his airway which would make him unresectable.  We spent a great deal of time discussing the results, and I showed him the images.  He is scheduled to meet with the surgical oncologist at [facility], but I reiterated that he likely had a T4 tumor with tracheal invasion prior to initiation of chemotherapy and radiation thus would not have been a surgical candidate at the beginning of his treatment.  The PET/CT reaffirms my concerns.  I have already spoken with his medical oncologist Dr. Burr Medico, and she plans to treat him with definitive therapy.  Bindi Klomp Bary Leriche

## 2020-01-09 ENCOUNTER — Telehealth: Payer: Self-pay | Admitting: Hematology

## 2020-01-09 NOTE — Progress Notes (Deleted)
[place]   Telephone:(336) [phone] Fax:(336) [phone]   Clinic Follow up Note   Patient Care Team: [name], MD as PCP - General (Hematology) Jonnie Finner, RN as Oncology Nurse Navigator [name], MD as Consulting Physician (Oncology) 01/09/2020  CHIEF COMPLAINT:   SUMMARY OF ONCOLOGIC HISTORY: Oncology History Overview Note  Cancer Staging Malignant neoplasm of middle third of esophagus ([place]) Staging form: Esophagus - Squamous Cell Carcinoma, AJCC 8th Edition - Clinical: Stage Unknown (cTX, cN1, cM0) - Signed by [name], MD on 08/18/2019    Malignant neoplasm of middle third of esophagus [facility])   Initial Diagnosis   Malignant neoplasm of middle third of esophagus ([place])   08/11/2019 Imaging   CT CAP W contrast 08/11/19 IMPRESSION: 1. Mid to lower esophageal mass along an 8 cm segment, large endoluminal component, strongly favoring esophageal malignancy. Borderline enlarged AP window lymph node. No findings of distant metastatic disease. 2. Trace bilateral pleural effusions. 3. Diffuse hepatic steatosis. 4. Cholelithiasis. 5. Fatty spermatic cords likely due to small bilateral indirect inguinal hernias.   08/11/2019 Procedure   EGD by Dr Carlean Purl  IMPRESSION - Partially obstructing, likely malignant esophageal tumor was found in the upper third of the esophagus and in the middle third of the esophagus. Biopsied. Very large and long - difficult but able to advance scope by this lesion - await CT for better length estimate - Normal stomach. - Normal examined duodenum.   08/11/2019 Initial Biopsy   FINAL MICROSCOPIC DIAGNOSIS:   A. ESOPHAGUS, UPPER, BIOPSY:  - Squamous cell carcinoma.    08/18/2019 Cancer Staging   Staging form: Esophagus - Squamous Cell Carcinoma, AJCC 8th Edition - Clinical: Stage Unknown (cTX, cN1, cM0) - Signed by [name], MD on 08/18/2019   08/29/2019 - 10/04/2019 Chemotherapy   Concurrent chemoradiation with weekly CT for  6 weeks starting 08/29/19-10/04/19    08/29/2019 - 10/06/2019 Radiation Therapy   Concurrent chemoradiation with Dr Lisbeth Renshaw starting 08/29/19-10/06/19   11/11/2019 PET scan   IMPRESSION: 1. Again seen is a long segment of FDG avid wall thickening of the distal half of the thoracic esophagus compatible with known esophageal neoplasm. 2. No signs of FDG avid nodal or distant metastatic disease.     11/28/2019 Procedure   EUS by Dr Rush Landmark IMPRESSION EGD Impression: - No gross lesions in esophagus proximally. - Previously noted esophageal cancer is not present. However, in its place and extending distal to this is likely LA Grade D chemotherapy/radiation esophagitis with bleeding. Biopsied after the EUS was complete to evaluate for persistent disease or not. - Z-line regular, 37 cm from the incisors. - 3 cm hiatal hernia. - Erythematous mucosa in the stomach. No other gross lesions in the stomach. Biopsied. - No gross lesions in the duodenal bulb, in the first portion of the duodenum and in the second portion of the duodenum. EUS Impression: - Wall thickening was seen in the thoracic esophagus into the gastroesophageal junction. The thickening appeared to primarily be within the luminal interface/superficial mucosa (Layer 1) and deep mucosa (Layer 2). The previously noted esophageal cancer has been replaced by what appears to be radiation/chemotherapy induced changes. With the limitations of having to use the mini-probe EUS, I could not visualize persistent significant disease other than the entire distal area of the esophagus having likely changes noted as above. - No malignant-appearing lymph nodes were visualized in the middle paraesophageal mediastinum (level 64M), lower paraesophageal mediastinum (level 8L), diaphragmatic region (level 15) and paracardial  region (level 16).      FINAL MICROSCOPIC DIAGNOSIS:   A. STOMACH, BIOPSY:  - Mildly active chronic gastritis.  -  Warthin-Starry negative for Helicobacter pylori.  - No intestinal metaplasia, dysplasia or carcinoma.   B. ESOPHAGUS, BIOPSY:  - Inflamed granulation tissue and exudate consistent with ulcer.  - No malignancy identified.    12/20/2019 -  Chemotherapy   CAPOX q3weeks with Xeloda 1500mg  BID 2 weeks on/1 week off starting 12/20/19     CURRENT THERAPY:   INTERVAL HISTORY:   REVIEW OF SYSTEMS:   Constitutional: Denies fevers, chills or abnormal weight loss Eyes: Denies blurriness of vision Ears, nose, mouth, throat, and face: Denies mucositis or sore throat Respiratory: Denies cough, dyspnea or wheezes Cardiovascular: Denies palpitation, chest discomfort or lower extremity swelling Gastrointestinal:  Denies nausea, heartburn or change in bowel habits Skin: Denies abnormal skin rashes Lymphatics: Denies new lymphadenopathy or easy bruising Neurological:Denies numbness, tingling or new weaknesses Behavioral/Psych: Mood is stable, no new changes  All other systems were reviewed with the patient and are negative.  MEDICAL HISTORY:  Past Medical History:  Diagnosis Date  . GERD (gastroesophageal reflux disease)   . Hematemesis with nausea 08/10/2019    SURGICAL HISTORY: Past Surgical History:  Procedure Laterality Date  . BIOPSY  08/11/2019   Procedure: BIOPSY;  Surgeon: [name], MD;  Location: [facility] ENDOSCOPY;  Service: Endoscopy;;  . BIOPSY  11/28/2019   Procedure: BIOPSY;  Surgeon: [name], MD;  Location: [place];  Service: Gastroenterology;;  . ESOPHAGOGASTRODUODENOSCOPY  08/11/2019  . ESOPHAGOGASTRODUODENOSCOPY (EGD) WITH PROPOFOL N/A 08/11/2019   Procedure: ESOPHAGOGASTRODUODENOSCOPY (EGD) WITH PROPOFOL;  Surgeon: [name], MD;  Location: [place];  Service: Endoscopy;  Laterality: N/A;  . ESOPHAGOGASTRODUODENOSCOPY (EGD) WITH PROPOFOL N/A 11/28/2019   Procedure: ESOPHAGOGASTRODUODENOSCOPY (EGD) WITH PROPOFOL;  Surgeon: Rush Landmark Telford Nab., MD;  Location: [place];  Service: Gastroenterology;  Laterality: N/A;  . EUS N/A 11/28/2019   Procedure: UPPER ENDOSCOPIC ULTRASOUND (EUS) RADIAL;  Surgeon: Rush Landmark Telford Nab., MD;  Location: [place];  Service: Gastroenterology;  Laterality: N/A;    I have reviewed the social history and family history with the patient and they are unchanged from previous note.  ALLERGIES:  has No Known Allergies.  MEDICATIONS:  Current Outpatient Medications  Medication Sig Dispense Refill  . dexamethasone (DECADRON) 4 MG tablet Take 2 tablets (8 mg total) by mouth daily. Start the day after chemotherapy for 2 days. Take with food. 30 tablet 1  . diphenhydrAMINE-APAP, sleep, (TYLENOL PM EXTRA STRENGTH) 50-1000 MG/30ML LIQD Take 15 mLs by mouth at bedtime as needed (sleep).    . famotidine (PEPCID) 20 MG tablet Take 1 tablet (20 mg total) by mouth 2 (two) times daily. 60 tablet 3  . Ferrous Sulfate (IRON) 325 (65 Fe) MG TABS Take 1 tablet (325 mg total) by mouth 2 (two) times daily. 60 tablet 3  . HYDROcodone-acetaminophen (HYCET) 7.5-325 mg/15 ml solution Take 15 mLs by mouth every 6 (six) hours as needed for moderate pain. (Patient taking differently: Take 15 mLs by mouth 2 (two) times daily as needed for moderate pain.) 473 mL 0  . lidocaine-prilocaine (EMLA) cream Apply to affected area once 30 g 3  . LORazepam (ATIVAN) 0.5 MG tablet Take 1 tablet (0.5 mg total) by mouth every 6 (six) hours as needed (Nausea or vomiting). 30 tablet 0  . ondansetron (ZOFRAN) 8 MG tablet Take 1 tablet (8 mg total) by mouth 2 (two) times daily  as needed for refractory nausea / vomiting. Start on day 3 after chemotherapy. 30 tablet 1  . prochlorperazine (COMPAZINE) 10 MG tablet Take 1 tablet (10 mg total) by mouth every 6 (six) hours as needed (Nausea or vomiting). 30 tablet 1  . sucralfate (CARAFATE) 1 g tablet Take 1 tablet (1 g total) by mouth 4 (four) times daily. Dissolve each tablet in 15 cc water before  use. 120 tablet 2  . XELODA 500 MG tablet Take 3 tablets (1,500 mg total) by mouth 2 (two) times daily after a meal. Take 14 days on, 7 days off, repeat every 21 days. 84 tablet 0   No current facility-administered medications for this visit.    PHYSICAL EXAMINATION: ECOG PERFORMANCE STATUS: {CHL ONC ECOG PS:[phone]}  There were no vitals filed for this visit. There were no vitals filed for this visit.  GENERAL:alert, no distress and comfortable SKIN: skin color, texture, turgor are normal, no rashes or significant lesions EYES: normal, Conjunctiva are pink and non-injected, sclera clear OROPHARYNX:no exudate, no erythema and lips, buccal mucosa, and tongue normal  NECK: supple, thyroid normal size, non-tender, without nodularity LYMPH:  no palpable lymphadenopathy in the cervical, axillary or inguinal LUNGS: clear to auscultation and percussion with normal breathing effort HEART: regular rate & rhythm and no murmurs and no lower extremity edema ABDOMEN:abdomen soft, non-tender and normal bowel sounds Musculoskeletal:no cyanosis of digits and no clubbing  NEURO: alert & oriented x 3 with fluent speech, no focal motor/sensory deficits  LABORATORY DATA:  I have reviewed the data as listed CBC Latest Ref Rng & Units 12/28/2019 12/20/2019 12/08/2019  WBC 4.0 - 10.5 K/uL 4.8 3.9(L) 4.1  Hemoglobin 13.0 - 17.0 g/dL 14.6 14.3 13.9  Hematocrit 39.0 - 52.0 % 42.0 41.8 39.5  Platelets 150 - 400 K/uL 223 156 173     CMP Latest Ref Rng & Units 12/28/2019 12/20/2019 12/08/2019  Glucose 70 - 99 mg/dL 96 117(H) 104(H)  BUN 6 - 20 mg/dL 5(L) 7 5(L)  Creatinine 0.61 - 1.24 mg/dL 0.67 0.71 0.67  Sodium 135 - 145 mmol/L 135 139 140  Potassium 3.5 - 5.1 mmol/L 3.9 3.7 3.8  Chloride 98 - 111 mmol/L 102 105 106  CO2 22 - 32 mmol/L 25 23 24   Calcium 8.9 - 10.3 mg/dL 9.2 9.1 8.4(L)  Total Protein 6.5 - 8.1 g/dL 8.0 7.9 7.4  Total Bilirubin 0.3 - 1.2 mg/dL 0.4 0.6 0.4  Alkaline Phos 38 - 126 U/L  98 80 91  AST 15 - 41 U/L 21 26 19   ALT 0 - 44 U/L 11 17 8       RADIOGRAPHIC STUDIES: I have personally reviewed the radiological images as listed and agreed with the findings in the report. No results found.   ASSESSMENT & PLAN:  No problem-specific Assessment & Plan notes found for this encounter.   No orders of the defined types were placed in this encounter.  All questions were answered. The patient knows to call the clinic with any problems, questions or concerns. No barriers to learning was detected. I spent {CHL ONC TIME VISIT - JJHER:[id number]} counseling the patient face to face. The total time spent in the appointment was {CHL ONC TIME VISIT - HUDJS:[id number]} and more than 50% was on counseling and review of test results     Alla Feeling, NP 01/09/20

## 2020-01-09 NOTE — Telephone Encounter (Signed)
Rescheduled 12/14 appointments per patient's request, spoke with patient's daughter regarding rescheduled appointments.

## 2020-01-10 ENCOUNTER — Inpatient Hospital Stay: Payer: Self-pay

## 2020-01-10 ENCOUNTER — Inpatient Hospital Stay: Payer: Self-pay | Admitting: Nurse Practitioner

## 2020-01-11 NOTE — Progress Notes (Signed)
[place]   Telephone:([phone] Fax:(336) [phone]   Clinic Follow up Note   Patient Care Team: [name], MD as PCP - General (Hematology) Jonnie Finner, RN as Oncology Nurse Navigator [name], MD as Consulting Physician (Oncology) 01/12/2020  CHIEF COMPLAINT: Follow up esophagus cancer   SUMMARY OF ONCOLOGIC HISTORY: Oncology History Overview Note  Cancer Staging Malignant neoplasm of middle third of esophagus ([place]) Staging form: Esophagus - Squamous Cell Carcinoma, AJCC 8th Edition - Clinical: Stage Unknown (cTX, cN1, cM0) - Signed by [name], MD on 08/18/2019    Malignant neoplasm of middle third of esophagus [facility])   Initial Diagnosis   Malignant neoplasm of middle third of esophagus ([place])   08/11/2019 Imaging   CT CAP W contrast 08/11/19 IMPRESSION: 1. Mid to lower esophageal mass along an 8 cm segment, large endoluminal component, strongly favoring esophageal malignancy. Borderline enlarged AP window lymph node. No findings of distant metastatic disease. 2. Trace bilateral pleural effusions. 3. Diffuse hepatic steatosis. 4. Cholelithiasis. 5. Fatty spermatic cords likely due to small bilateral indirect inguinal hernias.   08/11/2019 Procedure   EGD by Dr Carlean Purl  IMPRESSION - Partially obstructing, likely malignant esophageal tumor was found in the upper third of the esophagus and in the middle third of the esophagus. Biopsied. Very large and long - difficult but able to advance scope by this lesion - await CT for better length estimate - Normal stomach. - Normal examined duodenum.   08/11/2019 Initial Biopsy   FINAL MICROSCOPIC DIAGNOSIS:   A. ESOPHAGUS, UPPER, BIOPSY:  - Squamous cell carcinoma.    08/18/2019 Cancer Staging   Staging form: Esophagus - Squamous Cell Carcinoma, AJCC 8th Edition - Clinical: Stage Unknown (cTX, cN1, cM0) - Signed by [name], MD on 08/18/2019   08/29/2019 - 10/04/2019 Chemotherapy   Concurrent  chemoradiation with weekly CT for 6 weeks starting 08/29/19-10/04/19    08/29/2019 - 10/06/2019 Radiation Therapy   Concurrent chemoradiation with Dr Lisbeth Renshaw starting 08/29/19-10/06/19   11/11/2019 PET scan   IMPRESSION: 1. Again seen is a long segment of FDG avid wall thickening of the distal half of the thoracic esophagus compatible with known esophageal neoplasm. 2. No signs of FDG avid nodal or distant metastatic disease.     11/28/2019 Procedure   EUS by Dr Rush Landmark IMPRESSION EGD Impression: - No gross lesions in esophagus proximally. - Previously noted esophageal cancer is not present. However, in its place and extending distal to this is likely LA Grade D chemotherapy/radiation esophagitis with bleeding. Biopsied after the EUS was complete to evaluate for persistent disease or not. - Z-line regular, 37 cm from the incisors. - 3 cm hiatal hernia. - Erythematous mucosa in the stomach. No other gross lesions in the stomach. Biopsied. - No gross lesions in the duodenal bulb, in the first portion of the duodenum and in the second portion of the duodenum. EUS Impression: - Wall thickening was seen in the thoracic esophagus into the gastroesophageal junction. The thickening appeared to primarily be within the luminal interface/superficial mucosa (Layer 1) and deep mucosa (Layer 2). The previously noted esophageal cancer has been replaced by what appears to be radiation/chemotherapy induced changes. With the limitations of having to use the mini-probe EUS, I could not visualize persistent significant disease other than the entire distal area of the esophagus having likely changes noted as above. - No malignant-appearing lymph nodes were visualized in the middle paraesophageal mediastinum (level 16M), lower paraesophageal mediastinum (level 8L), diaphragmatic region (  level 15) and paracardial region (level 16).      FINAL MICROSCOPIC DIAGNOSIS:   A. STOMACH, BIOPSY:  - Mildly active  chronic gastritis.  - Warthin-Starry negative for Helicobacter pylori.  - No intestinal metaplasia, dysplasia or carcinoma.   B. ESOPHAGUS, BIOPSY:  - Inflamed granulation tissue and exudate consistent with ulcer.  - No malignancy identified.    12/20/2019 -  Chemotherapy   CAPOX q3weeks with Xeloda 1521m BID 2 weeks on/1 week off starting 12/20/19     CURRENT THERAPY: Consolidation Capox starting 11/23  INTERVAL HISTORY: [name] for follow up and treatment as scheduled. [facility]  He continues to have mild difficulty and pain with swallowing but able to eat whatever he wants.  Mild nausea managed with medicine at home, no vomiting.  Bowels moving normally.  Denies mucositis.  No hand or foot redness or pain.  He developed cold symptoms 2 weeks ago, after going outside in the cold without warm close.  He has a cough with yellow phlegm, no fever or chills.  Symptoms are improving.  He also injured his left knee at work, pain radiates up to the hip, he walked with a limp but is also getting better.    MEDICAL HISTORY:  Past Medical History:  Diagnosis Date  . GERD (gastroesophageal reflux disease)   . Hematemesis with nausea 08/10/2019    SURGICAL HISTORY: Past Surgical History:  Procedure Laterality Date  . BIOPSY  08/11/2019   Procedure: BIOPSY;  Surgeon: GGatha Mayer MD;  [facility];  Service: Endoscopy;;  . BIOPSY  11/28/2019   Procedure: BIOPSY;  Surgeon: MIrving Copas, MD;  [place]  Service: Gastroenterology;;  . ESOPHAGOGASTRODUODENOSCOPY  08/11/2019  . ESOPHAGOGASTRODUODENOSCOPY (EGD) WITH PROPOFOL N/A 08/11/2019   Procedure: ESOPHAGOGASTRODUODENOSCOPY (EGD) WITH PROPOFOL;  Surgeon: GGatha Mayer MD;  [place]  Service: End[place];  Laterality: N/A;  . ESOPHAGOGASTRODUODENOSCOPY (EGD) WITH PROPOFOL N/A 11/28/2019   Procedure: ESOPHAGOGASTRODUODENOSCOPY (EGD)  WITH PROPOFOL;  Surgeon: MRush LandmarkGTelford Nab, MD;  [place]  Service: Gastroenterology;  Laterality: N/A;  . EUS N/A 11/28/2019   Procedure: UPPER ENDOSCOPIC ULTRASOUND (EUS) RADIAL;  Surgeon: MRush LandmarkGTelford Nab, MD;  [place]  Service: Gastroenterology;  Laterality: N/A;    I have reviewed the social history and family history with the patient and they are unchanged from previous note.  ALLERGIES:  has No Known Allergies.  MEDICATIONS:  Current Outpatient Medications  Medication Sig Dispense Refill  . dexamethasone (DECADRON) 4 MG tablet Take 2 tablets (8 mg total) by mouth daily. Start the day after chemotherapy for 2 days. Take with food. 30 tablet 1  . diphenhydrAMINE-APAP, sleep, (TYLENOL PM EXTRA STRENGTH) 50-1000 MG/30ML LIQD Take 15 mLs by mouth at bedtime as needed (sleep).    . famotidine (PEPCID) 20 MG tablet Take 1 tablet (20 mg total) by mouth 2 (two) times daily. 60 tablet 3  . Ferrous Sulfate (IRON) 325 (65 Fe) MG TABS Take 1 tablet (325 mg total) by mouth 2 (two) times daily. 60 tablet 3  . HYDROcodone-acetaminophen (HYCET) 7.5-325 mg/15 ml solution Take 15 mLs by mouth every 6 (six) hours as needed for moderate pain. (Patient taking differently: Take 15 mLs by mouth 2 (two) times daily as needed for moderate pain.) 473 mL 0  . lidocaine-prilocaine (EMLA) cream Apply to affected area once 30 g 3  . LORazepam (ATIVAN) 0.5 MG tablet  Take 1 tablet (0.5 mg total) by mouth every 6 (six) hours as needed (Nausea or vomiting). 30 tablet 0  . ondansetron (ZOFRAN) 8 MG tablet Take 1 tablet (8 mg total) by mouth 2 (two) times daily as needed for refractory nausea / vomiting. Start on day 3 after chemotherapy. 30 tablet 1  . prochlorperazine (COMPAZINE) 10 MG tablet Take 1 tablet (10 mg total) by mouth every 6 (six) hours as needed (Nausea or vomiting). 30 tablet 1  . sucralfate (CARAFATE) 1 g tablet Take 1 tablet (1 g total) by mouth 4 (four) times  daily. Dissolve each tablet in 15 cc water before use. 120 tablet 2  . XELODA 500 MG tablet Take 3 tablets (1,500 mg total) by mouth 2 (two) times daily after a meal. Take 14 days on, 7 days off, repeat every 21 days. 84 tablet 0   No current facility-administered medications for this visit.    PHYSICAL EXAMINATION: ECOG PERFORMANCE STATUS: 1 - Symptomatic but completely ambulatory  Vitals:   01/12/20 1433  BP: 132/87  Pulse: (!) 114  Resp: 18  Temp: 98.6 F (37 C)  SpO2: 97%   Filed Weights   01/12/20 1433  Weight: 167 lb 3.2 oz (75.8 kg)    GENERAL:alert, no distress and comfortable SKIN: Dry palms with mild erythema, no peeling or cracks EYES: sclera clear OROPHARYNX: No thrush or ulcers NECK: Without mass LUNGS: clear with normal breathing effort HEART: regular rate & rhythm, no lower extremity edema ABDOMEN:abdomen soft, non-tender and normal bowel sounds Musculoskeletal: No focal tenderness NEURO: alert & oriented x 3 with fluent speech, no focal motor/sensory deficits  LABORATORY DATA:  I have reviewed the data as listed CBC Latest Ref Rng & Units 01/12/2020 12/28/2019 12/20/2019  WBC 4.0 - 10.5 K/uL 3.4(L) 4.8 3.9(L)  Hemoglobin 13.0 - 17.0 g/dL 14.3 14.6 14.3  Hematocrit 39.0 - 52.0 % 41.0 42.0 41.8  Platelets 150 - 400 K/uL 155 223 156     CMP Latest Ref Rng & Units 01/12/2020 12/28/2019 12/20/2019  Glucose 70 - 99 mg/dL 99 96 117(H)  BUN 6 - 20 mg/dL <4(L) 5(L) 7  Creatinine 0.61 - 1.24 mg/dL 0.72 0.67 0.71  Sodium 135 - 145 mmol/L 141 135 139  Potassium 3.5 - 5.1 mmol/L 3.9 3.9 3.7  Chloride 98 - 111 mmol/L 105 102 105  CO2 22 - 32 mmol/L '29 25 23  ' Calcium 8.9 - 10.3 mg/dL 9.0 9.2 9.1  Total Protein 6.5 - 8.1 g/dL 7.8 8.0 7.9  Total Bilirubin 0.3 - 1.2 mg/dL 0.4 0.4 0.6  Alkaline Phos 38 - 126 U/L 96 98 80  AST 15 - 41 U/L 37 21 26  ALT 0 - 44 U/L '25 11 17      ' RADIOGRAPHIC STUDIES: I have personally reviewed the radiological images as listed  and agreed with the findings in the report. No results found.   ASSESSMENT & PLAN: Slaton Reaser Hernandezis a 44 y.o.malewith   1.[name], cTxN1M0 -Diagnosed in 07/2019, he presented with6 months of intermittenthematemesisand progressive dysphagia and upper abdominal pain.08/11/19 CT CAP found him to havemidesophageal mass and enlarged AP window node, no distant metastasis. EGD on 7/15/21biopsyshowed Squamous Cell Carcinoma.PET scan not done due to self-pay -CompletedneoadjuvantccRTper Dr Lisbeth Renshaw with weekly carbo/Taxol on 08/29/2019 - 10/06/19.  Dysphagia improved, epigastric pain resolved. -Repeat PET 10/2019 showed hypermetabolic wall thickening of the distal thoracic esophagus, no notable distant metastasis.  Dr. Kipp Brood felt he was not a surgical candidate as  the tumor invades the trachea.  Follow-up with Dr. Kipp Brood 12/10. -He has a second opinion consult pending with Dr. Olen Pel on 12/8 -He began consolidation chemotherapy with Capox with Xeloda 1500 mg twice daily 2 weeks on and 1 week off starting 11/23.  s/p 1 cycle. Tolerating well  2.Moderate anemia secondary to GI bleeding and probable iron deficiency -He has hadrecurrentNausea and vomiting withhematemesissince 01/2019.  -He required blood transfusion on 08/15/19 and IV Feraheme on 08/12/19. -Continue oral iron. 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 -Met withfinancial advocate Shauna and SWto discuss grant/assistance  Disposition: [name] appears stable.  He completed 1 cycle of consolidation Capeox.  Tolerated treatment well without significant toxicities.  He continues to have mild dysphagia and odynophagia but able to tolerate a normal diet, weight is stable.  He is able to recover and function well.  He  continues ongoing work-up at [facility] to determine eligibility for surgical resection.  We will follow.  For his cold symptoms, continue OTC meds and supportive care.  Exam is benign.  [facility] on 12/14 shows no airspace consolidation.  He knows to call if his symptoms worsen or fail to improve or he develops new fever.  For left knee injury at work, I recommend supportive care, rest, NSAIDs.  No x-ray needed at this time.  Labs reviewed, adequate to proceed with cycle 2 Capeox on 01/13/2020 as scheduled.  Follow-up in 3 weeks for cycle 3.  All questions were answered. The patient knows to call the clinic with any problems, questions or concerns. No barriers to learning were detected with Spanish interpreter.      Alla Feeling, NP 01/12/20

## 2020-01-12 ENCOUNTER — Other Ambulatory Visit: Payer: Self-pay

## 2020-01-12 ENCOUNTER — Inpatient Hospital Stay: Payer: Self-pay

## 2020-01-12 ENCOUNTER — Inpatient Hospital Stay (HOSPITAL_BASED_OUTPATIENT_CLINIC_OR_DEPARTMENT_OTHER): Payer: Self-pay | Admitting: Nurse Practitioner

## 2020-01-12 ENCOUNTER — Encounter: Payer: Self-pay | Admitting: Nurse Practitioner

## 2020-01-12 VITALS — BP 132/87 | HR 114 | Temp 98.6°F | Resp 18 | Ht 66.0 in | Wt 167.2 lb

## 2020-01-12 DIAGNOSIS — C154 Malignant neoplasm of middle third of esophagus: Secondary | ICD-10-CM

## 2020-01-12 LAB — CBC WITH DIFFERENTIAL (CANCER CENTER ONLY)
Abs Immature Granulocytes: 0.03 10*3/uL (ref 0.00–0.07)
Basophils Absolute: 0 10*3/uL (ref 0.0–0.1)
Basophils Relative: 0 %
Eosinophils Absolute: 0.1 10*3/uL (ref 0.0–0.5)
Eosinophils Relative: 2 %
HCT: 41 % (ref 39.0–52.0)
Hemoglobin: 14.3 g/dL (ref 13.0–17.0)
Immature Granulocytes: 1 %
Lymphocytes Relative: 20 %
Lymphs Abs: 0.7 10*3/uL (ref 0.7–4.0)
MCH: 32 pg (ref 26.0–34.0)
MCHC: 34.9 g/dL (ref 30.0–36.0)
MCV: 91.7 fL (ref 80.0–100.0)
Monocytes Absolute: 0.8 10*3/uL (ref 0.1–1.0)
Monocytes Relative: 23 %
Neutro Abs: 1.8 10*3/uL (ref 1.7–7.7)
Neutrophils Relative %: 54 %
Platelet Count: 155 10*3/uL (ref 150–400)
RBC: 4.47 MIL/uL (ref 4.22–5.81)
RDW: 13.5 % (ref 11.5–15.5)
WBC Count: 3.4 10*3/uL — ABNORMAL LOW (ref 4.0–10.5)
nRBC: 0 % (ref 0.0–0.2)

## 2020-01-12 LAB — CMP (CANCER CENTER ONLY)
ALT: 25 U/L (ref 0–44)
AST: 37 U/L (ref 15–41)
Albumin: 3.5 g/dL (ref 3.5–5.0)
Alkaline Phosphatase: 96 U/L (ref 38–126)
Anion gap: 7 (ref 5–15)
BUN: <4 mg/dL — ABNORMAL LOW (ref 6–20)
CO2: 29 mmol/L (ref 22–32)
Calcium: 9 mg/dL (ref 8.9–10.3)
Chloride: 105 mmol/L (ref 98–111)
Creatinine: 0.72 mg/dL (ref 0.61–1.24)
GFR, Estimated: >60 mL/min (ref >60)
Glucose, Bld: 99 mg/dL (ref 70–99)
Potassium: 3.9 mmol/L (ref 3.5–5.1)
Sodium: 141 mmol/L (ref 135–145)
Total Bilirubin: 0.4 mg/dL (ref 0.3–1.2)
Total Protein: 7.8 g/dL (ref 6.5–8.1)

## 2020-01-13 ENCOUNTER — Inpatient Hospital Stay: Payer: Self-pay

## 2020-01-13 ENCOUNTER — Other Ambulatory Visit: Payer: Self-pay | Admitting: Hematology

## 2020-01-13 ENCOUNTER — Other Ambulatory Visit: Payer: Self-pay

## 2020-01-13 VITALS — BP 134/84 | HR 99 | Temp 99.3°F | Resp 18

## 2020-01-13 DIAGNOSIS — C154 Malignant neoplasm of middle third of esophagus: Secondary | ICD-10-CM

## 2020-01-13 MED ORDER — DEXTROSE 5 % IV SOLN
Freq: Once | INTRAVENOUS | Status: AC
  Filled 2020-01-13: qty 250

## 2020-01-13 MED ORDER — PALONOSETRON HCL INJECTION 0.25 MG/5ML
INTRAVENOUS | Status: AC
  Filled 2020-01-13: qty 5

## 2020-01-13 MED ORDER — XELODA 500 MG PO TABS: 850.0000 mg/m2 | ORAL_TABLET | Freq: Two times a day (BID) | ORAL | 1 refills | Status: DC

## 2020-01-13 MED ORDER — OXALIPLATIN CHEMO INJECTION 100 MG/20ML
130.0000 mg/m2 | Freq: Once | INTRAVENOUS | Status: AC
  Administered 2020-01-13: 14:00:00 245 mg via INTRAVENOUS
  Filled 2020-01-13: qty 40

## 2020-01-13 MED ORDER — PALONOSETRON HCL INJECTION 0.25 MG/5ML
0.2500 mg | Freq: Once | INTRAVENOUS | Status: AC
  Administered 2020-01-13: 13:00:00 0.25 mg via INTRAVENOUS

## 2020-01-13 MED ORDER — SODIUM CHLORIDE 0.9 % IV SOLN
10.0000 mg | Freq: Once | INTRAVENOUS | Status: AC
  Administered 2020-01-13: 13:00:00 10 mg via INTRAVENOUS
  Filled 2020-01-13: qty 1
  Filled 2020-01-13: qty 10

## 2020-01-13 NOTE — Patient Instructions (Signed)
[facility] Discharge Instructions for Patients Receiving Chemotherapy  Today you received the following chemotherapy agent: OXALIPLATIN  To help prevent nausea and vomiting after your treatment, we encourage you to take your nausea medication as prescribed by MD. **DO NOT TAKE ZOFRAN FOR 3 DAYS AFTER CHEMOTHERAPY**   If you develop nausea and vomiting that is not controlled by your nausea medication, call the clinic.   BELOW ARE SYMPTOMS THAT SHOULD BE REPORTED IMMEDIATELY:  *FEVER GREATER THAN 100.5 F  *CHILLS WITH OR WITHOUT FEVER  NAUSEA AND VOMITING THAT IS NOT CONTROLLED WITH YOUR NAUSEA MEDICATION  *UNUSUAL SHORTNESS OF BREATH  *UNUSUAL BRUISING OR BLEEDING  TENDERNESS IN MOUTH AND THROAT WITH OR WITHOUT PRESENCE OF ULCERS  *URINARY PROBLEMS  *BOWEL PROBLEMS  UNUSUAL RASH Items with * indicate a potential emergency and should be followed up as soon as possible.  Feel free to call the clinic should you have any questions or concerns. The clinic phone number is [phone].  Please show the [facility] at check-in to the Emergency Department and triage nurse.

## 2020-01-30 NOTE — Progress Notes (Signed)
[facility] [place]   Telephone:([phone]) [phone] Fax:(336) [phone]   Clinic Follow up Note   Patient Care Team: Jimmy Merle, MD as PCP - General (Hematology) Jimmy Finner, RN as Oncology Nurse Navigator Jimmy Merle, MD as Consulting Physician (Oncology)  Date of Service:  02/02/2020  CHIEF COMPLAINT: F/u ofMiddleEsophageal Squamous CellCarcinoma  SUMMARY OF ONCOLOGIC HISTORY: Oncology History Overview Note  Cancer Staging Malignant neoplasm of middle third of esophagus ([place]) Staging form: Esophagus - Squamous Cell Carcinoma, AJCC 8th Edition - Clinical: Stage Unknown (cTX, cN1, cM0) - Signed by Jimmy Merle, MD on 08/18/2019    Malignant neoplasm of middle third of esophagus [facility])   Initial Diagnosis   Malignant neoplasm of middle third of esophagus ([place])   08/11/2019 Imaging   CT CAP W contrast 08/11/19 IMPRESSION: 1. Mid to lower esophageal mass along an 8 cm segment, large endoluminal component, strongly favoring esophageal malignancy. Borderline enlarged AP window lymph node. No findings of distant metastatic disease. 2. Trace bilateral pleural effusions. 3. Diffuse hepatic steatosis. 4. Cholelithiasis. 5. Fatty spermatic cords likely due to small bilateral indirect inguinal hernias.   08/11/2019 Procedure   EGD by Dr Jimmy Hawkins  IMPRESSION - Partially obstructing, likely malignant esophageal tumor was found in the upper third of the esophagus and in the middle third of the esophagus. Biopsied. Very large and long - difficult but able to advance scope by this lesion - await CT for better length estimate - Normal stomach. - Normal examined duodenum.   08/11/2019 Initial Biopsy   FINAL MICROSCOPIC DIAGNOSIS:   A. ESOPHAGUS, UPPER, BIOPSY:  - Squamous cell carcinoma.    08/18/2019 Cancer Staging   Staging form: Esophagus - Squamous Cell Carcinoma, AJCC 8th Edition - Clinical: Stage Unknown (cTX, cN1, cM0) - Signed by Jimmy Merle, MD on 08/18/2019   08/29/2019 -  10/04/2019 Chemotherapy   Concurrent chemoradiation with weekly CT for 6 weeks starting 08/29/19-10/04/19    08/29/2019 - 10/06/2019 Radiation Therapy   Concurrent chemoradiation with Dr Jimmy Hawkins starting 08/29/19-10/06/19   11/11/2019 PET scan   IMPRESSION: 1. Again seen is a long segment of FDG avid wall thickening of the distal half of the thoracic esophagus compatible with known esophageal neoplasm. 2. No Hawkins of FDG avid nodal or distant metastatic disease.     11/28/2019 Procedure   EUS by Dr Jimmy Hawkins IMPRESSION EGD Impression: - No gross lesions in esophagus proximally. - Previously noted esophageal cancer is not present. However, in its place and extending distal to this is likely LA Grade D chemotherapy/radiation esophagitis with bleeding. Biopsied after the EUS was complete to evaluate for persistent disease or not. - Z-line regular, 37 cm from the incisors. - 3 cm hiatal hernia. - Erythematous mucosa in the stomach. No other gross lesions in the stomach. Biopsied. - No gross lesions in the duodenal bulb, in the first portion of the duodenum and in the second portion of the duodenum. EUS Impression: - Wall thickening was seen in the thoracic esophagus into the gastroesophageal junction. The thickening appeared to primarily be within the luminal interface/superficial mucosa (Layer 1) and deep mucosa (Layer 2). The previously noted esophageal cancer has been replaced by what appears to be radiation/chemotherapy induced changes. With the limitations of having to use the mini-probe EUS, I could not visualize persistent significant disease other than the entire distal area of the esophagus having likely changes noted as above. - No malignant-appearing lymph nodes were visualized in the middle paraesophageal mediastinum (level 72M), lower paraesophageal mediastinum (  level 8L), diaphragmatic region (level 15) and paracardial region (level 16).      FINAL MICROSCOPIC DIAGNOSIS:   A.  STOMACH, BIOPSY:  - Mildly active chronic gastritis.  - Warthin-Starry negative for Helicobacter pylori.  - No intestinal metaplasia, dysplasia or carcinoma.   B. ESOPHAGUS, BIOPSY:  - Inflamed granulation tissue and exudate consistent with ulcer.  - No malignancy identified.    12/20/2019 -  Chemotherapy   CAPOX q3weeks with Xeloda 1574m BID 2 weeks on/1 week off starting 12/20/19      CURRENT THERAPY:  CAPOX q3weeks with Xeloda 15071mBID 2 weeks on/1 week off starting 12/20/19  INTERVAL HISTORY:  Jimmy Hawkins here for a follow up. He presents to the clinic with his sister and interpreter [name] is clinically doing well, tolerating chemotherapy well.  He had mild fatigue, low appetite and cold sensitivity for 7 2 days after chemo, recovers well.  He functions well at home.  No significant nausea, diarrhea, or other new complaints. He has an accident 4-5 weeks ago, he left knee and in the side was hit by a metal when he was walking, no fall, the pain at the injury site has resolved.  However he has developed some pain in the left hip which radiates to buttock lately.  He is able to walk without difficulty. [facility] and will have bronchoscopy in the near future there, to see if he is a candidate for surgical resection.   All other systems were reviewed with the patient and are negative.  MEDICAL HISTORY:  Past Medical History:  Diagnosis Date  . GERD (gastroesophageal reflux disease)   . Hematemesis with nausea 08/10/2019    SURGICAL HISTORY: Past Surgical History:  Procedure Laterality Date  . BIOPSY  08/11/2019   Procedure: BIOPSY;  Surgeon: GeGatha MayerMD;  [facility];  Service: Endoscopy;;  . BIOPSY  11/28/2019   Procedure: BIOPSY;  Surgeon: MaIrving Copas MD;  [facility] Service: Gastroenterology;;  . ESOPHAGOGASTRODUODENOSCOPY  08/11/2019  .  ESOPHAGOGASTRODUODENOSCOPY (EGD) WITH PROPOFOL N/A 08/11/2019   Procedure: ESOPHAGOGASTRODUODENOSCOPY (EGD) WITH PROPOFOL;  Surgeon: GeGatha MayerMD;  [facility] Service: Endoscopy;  Laterality: N/A;  . ESOPHAGOGASTRODUODENOSCOPY (EGD) WITH PROPOFOL N/A 11/28/2019   Procedure: ESOPHAGOGASTRODUODENOSCOPY (EGD) WITH PROPOFOL;  Surgeon: MaRush LandmarkaTelford Nab MD;  [facility] Service: Gastroenterology;  Laterality: N/A;  . EUS N/A 11/28/2019   Procedure: UPPER ENDOSCOPIC ULTRASOUND (EUS) RADIAL;  Surgeon: MaRush LandmarkaTelford Nab MD;  [facility] Service: Gastro[facility]ology;  Laterality: N/A;    I have reviewed the social history and family history with the patient and they are unchanged from previous note.  ALLERGIES:  has No Known Allergies.  MEDICATIONS:  Current Outpatient Medications  Medication Sig Dispense Refill  . dexamethasone (DECADRON) 4 MG tablet Take 2 tablets (8 mg total) by mouth daily. Start the day after chemotherapy for 2 days. Take with food. 30 tablet 1  . diphenhydrAMINE-APAP, sleep, (TYLENOL PM EXTRA STRENGTH) 50-1000 MG/30ML LIQD Take 15 mLs by mouth at bedtime as needed (sleep).    . famotidine (PEPCID) 20 MG tablet Take 1 tablet (20 mg total) by mouth 2 (two) times daily. 60 tablet 3  . Ferrous Sulfate (IRON) 325 (65 Fe) MG TABS Take 1 tablet (325 mg total) by mouth 2 (two) times daily. 60 tablet 3  . HYDROcodone-acetaminophen (HYCET) 7.5-325 mg/15 ml solution  Take 15 mLs by mouth every 6 (six) hours as needed for moderate pain. (Patient taking differently: Take 15 mLs by mouth 2 (two) times daily as needed for moderate pain.) 473 mL 0  . lidocaine-prilocaine (EMLA) cream Apply to affected area once 30 g 3  . LORazepam (ATIVAN) 0.5 MG tablet Take 1 tablet (0.5 mg total) by mouth every 6 (six) hours as needed (Nausea or vomiting). 30 tablet 0  . ondansetron (ZOFRAN) 8 MG tablet Take 1 tablet (8 mg total) by mouth 2 (two) times daily  as needed for refractory nausea / vomiting. Start on day 3 after chemotherapy. 30 tablet 1  . prochlorperazine (COMPAZINE) 10 MG tablet Take 1 tablet (10 mg total) by mouth every 6 (six) hours as needed (Nausea or vomiting). 30 tablet 1  . sucralfate (CARAFATE) 1 g tablet Take 1 tablet (1 g total) by mouth 4 (four) times daily. Dissolve each tablet in 15 cc water before use. 120 tablet 2  . XELODA 500 MG tablet Take 3 tablets (1,500 mg total) by mouth 2 (two) times daily after a meal. Take 14 days on, 7 days off, repeat every 21 days. 84 tablet 1   No current facility-administered medications for this visit.    PHYSICAL EXAMINATION: ECOG PERFORMANCE STATUS: 1 - Symptomatic but completely ambulatory  Vitals:   02/02/20 1130  BP: (!) 147/92  Pulse: (!) 106  Resp: 16  Temp: 98.2 F (36.8 C)  SpO2: 99%   Filed Weights   02/02/20 1130  Weight: 165 lb 9.6 oz (75.1 kg)   GENERAL:alert, no distress and comfortable SKIN: skin color, texture, turgor are normal, no rashes or significant lesions EYES: normal, Conjunctiva are pink and non-injected, sclera clear NECK: supple, thyroid normal size, non-tender, without nodularity LYMPH:  no palpable lymphadenopathy in the cervical, axillary  LUNGS: clear to auscultation and percussion with normal breathing effort HEART: regular rate & rhythm and no murmurs and no lower extremity edema ABDOMEN:abdomen soft, non-tender and normal bowel sounds Musculoskeletal:no cyanosis of digits and no clubbing  NEURO: alert & oriented x 3 with fluent speech, no focal motor/sensory deficits  LABORATORY DATA:  I have reviewed the data as listed CBC Latest Ref Rng & Units 02/02/2020 01/12/2020 12/28/2019  WBC 4.0 - 10.5 K/uL 2.9(L) 3.4(L) 4.8  Hemoglobin 13.0 - 17.0 g/dL 14.6 14.3 14.6  Hematocrit 39.0 - 52.0 % 41.2 41.0 42.0  Platelets 150 - 400 K/uL 143(L) 155 223     CMP Latest Ref Rng & Units 01/12/2020 12/28/2019 12/20/2019  Glucose 70 - 99 mg/dL 99 96  117(H)  BUN 6 - 20 mg/dL <4(L) 5(L) 7  Creatinine 0.61 - 1.24 mg/dL 0.72 0.67 0.71  Sodium 135 - 145 mmol/L 141 135 139  Potassium 3.5 - 5.1 mmol/L 3.9 3.9 3.7  Chloride 98 - 111 mmol/L 105 102 105  CO2 22 - 32 mmol/L _0 Calcium 8.9 - 10.3 mg/dL 9.0 9.2 9.1  Total Protein 6.5 - 8.1 g/dL 7.8 8.0 7.9  Total Bilirubin 0.3 - 1.2 mg/dL 0.4 0.4 0.6  Alkaline Phos 38 - 126 U/L 96 98 80  AST 15 - 41 U/L 37 21 26  ALT 0 - 44 U/L _1 RADIOGRAPHIC STUDIES: I have personally reviewed the radiological images as listed and agreed with the findings in the report. No results found.   ASSESSMENT & PLAN:  Vicky Mccanless is a 45 y.o. male with   1.MiddleEsophageal  Squamous CellCarcinoma, cTxN1M0 -Hewas diagnosed in 07/2019 withpresented withSquamous Cell Carcinoma as seen on EGD.08/11/19 CT CAP found him to havemidesophageal mass and enlarged AP window node, no distant metastasis.  -He completed 6 weeks of concurrent chemoRT withweekly Carboplatin and Taxol 08/29/19-10/06/19 -Repeat PET scan October 2021 showed hypermetabolic wall thickening of the distal half of the thoracic esophageus,no notable distant metastasis. Dr. Kipp Brood has reviewed the PET scan, and feels that his tumor invadestrachea is it's not resectable. He will f/u with Dr Kipp Brood on 01/06/20. -EUS on 11/1/2021showed thickening of esophageal wall at tumor site, no visible tumor, biopsy of the mucosa was negative for malignant cells. -He had a second opinion consult with Dr. Olen Pel on 12/8 and is going to have bronchoscopy to determine if his tumor is resectable. -I started him on consolidation chemotherapy with CAPOX q3weeks with Xeloda 1538m BID 2 weeks on/1 week off starting 12/20/19. He has had 2 cycles, overall tolerated well. -Lab reviewed, mild neutropenia, adequate for treatment, will proceed to cycle 3 CAPOX today. -Follow-up in 3 weeks before cycle 4.   2.Moderate anemia secondary to  GI bleeding and probable iron deficiency -He has hadrecurrentNausea and vomiting withhematemesissince 01/2019.  -He required blood transfusion on 08/15/19 and IV Feraheme on 7/16/21and 08/22/19. -Continue oral iron. -anemiaresolved. Will monitor on consolidation chemo.   3.H/o Heavy alcohol user. He quit drinkingsince cancer diagnosis.   5. Social and Financial Support -Patient does not have insurance. -He has met withfinancial advocate Shauna and SLoreli Dollarhe has grant assistance with medications. Ipreviouslydiscussed for grant use [facility]   PLAN: -lab reviewed, mild neutropenia, adequate for treatment, will proceed to cycle 3 CAPOX today at same dose  --Follow-up in 3 weeks before cycle 4.  -he will contact [facility] to schedule his bronchoscopy    No problem-specific Assessment & Plan notes found for this encounter.   No orders of the defined types were placed in this encounter.  All questions were answered. The patient knows to call the clinic with any problems, questions or concerns. No barriers to learning was detected. The total time spent in the appointment was 30 minutes.     YTruitt Merle MD 02/02/2020   I, AJoslyn Devon am acting as scribe for YTruitt Merle MD.   I have reviewed the above documentation for accuracy and completeness, and I agree with the above.

## 2020-02-02 ENCOUNTER — Ambulatory Visit (HOSPITAL_COMMUNITY)
Admission: RE | Admit: 2020-02-02 | Discharge: 2020-02-02 | Disposition: A | Discharge status: Home / Self Care | Payer: Self-pay | Source: Ambulatory Visit | Attending: Hematology | Admitting: Hematology

## 2020-02-02 ENCOUNTER — Other Ambulatory Visit: Payer: Self-pay

## 2020-02-02 ENCOUNTER — Inpatient Hospital Stay: Payer: Self-pay

## 2020-02-02 ENCOUNTER — Inpatient Hospital Stay: Payer: Self-pay | Attending: Hematology | Admitting: Hematology

## 2020-02-02 ENCOUNTER — Other Ambulatory Visit: Payer: Self-pay | Admitting: Hematology

## 2020-02-02 ENCOUNTER — Encounter: Payer: Self-pay | Admitting: Hematology

## 2020-02-02 ENCOUNTER — Telehealth: Payer: Self-pay | Admitting: Hematology

## 2020-02-02 VITALS — HR 103

## 2020-02-02 VITALS — BP 147/92 | HR 106 | Temp 98.2°F | Resp 16 | Ht 66.0 in | Wt 165.6 lb

## 2020-02-02 DIAGNOSIS — Z5111 Encounter for antineoplastic chemotherapy: Secondary | ICD-10-CM | POA: Insufficient documentation

## 2020-02-02 DIAGNOSIS — D500 Iron deficiency anemia secondary to blood loss (chronic): Secondary | ICD-10-CM | POA: Insufficient documentation

## 2020-02-02 DIAGNOSIS — D696 Thrombocytopenia, unspecified: Secondary | ICD-10-CM | POA: Insufficient documentation

## 2020-02-02 DIAGNOSIS — M25552 Pain in left hip: Secondary | ICD-10-CM | POA: Insufficient documentation

## 2020-02-02 DIAGNOSIS — C154 Malignant neoplasm of middle third of esophagus: Secondary | ICD-10-CM

## 2020-02-02 DIAGNOSIS — F1011 Alcohol abuse, in remission: Secondary | ICD-10-CM | POA: Insufficient documentation

## 2020-02-02 DIAGNOSIS — S79912A Unspecified injury of left hip, initial encounter: Secondary | ICD-10-CM | POA: Insufficient documentation

## 2020-02-02 DIAGNOSIS — D709 Neutropenia, unspecified: Secondary | ICD-10-CM | POA: Insufficient documentation

## 2020-02-02 LAB — CMP (CANCER CENTER ONLY)
ALT: 23 U/L (ref 0–44)
AST: 33 U/L (ref 15–41)
Albumin: 3.4 g/dL — ABNORMAL LOW (ref 3.5–5.0)
Alkaline Phosphatase: 102 U/L (ref 38–126)
Anion gap: 9 (ref 5–15)
BUN: 6 mg/dL (ref 6–20)
CO2: 24 mmol/L (ref 22–32)
Calcium: 9.2 mg/dL (ref 8.9–10.3)
Chloride: 104 mmol/L (ref 98–111)
Creatinine: 0.66 mg/dL (ref 0.61–1.24)
GFR, Estimated: >60 mL/min (ref >60)
Glucose, Bld: 109 mg/dL — ABNORMAL HIGH (ref 70–99)
Potassium: 3.5 mmol/L (ref 3.5–5.1)
Sodium: 137 mmol/L (ref 135–145)
Total Bilirubin: 0.7 mg/dL (ref 0.3–1.2)
Total Protein: 8.1 g/dL (ref 6.5–8.1)

## 2020-02-02 LAB — CBC WITH DIFFERENTIAL (CANCER CENTER ONLY)
Abs Immature Granulocytes: 0.03 10*3/uL (ref 0.00–0.07)
Basophils Absolute: 0 10*3/uL (ref 0.0–0.1)
Basophils Relative: 1 %
Eosinophils Absolute: 0.1 10*3/uL (ref 0.0–0.5)
Eosinophils Relative: 2 %
HCT: 41.2 % (ref 39.0–52.0)
Hemoglobin: 14.6 g/dL (ref 13.0–17.0)
Immature Granulocytes: 1 %
Lymphocytes Relative: 17 %
Lymphs Abs: 0.5 10*3/uL — ABNORMAL LOW (ref 0.7–4.0)
MCH: 32.2 pg (ref 26.0–34.0)
MCHC: 35.4 g/dL (ref 30.0–36.0)
MCV: 90.7 fL (ref 80.0–100.0)
Monocytes Absolute: 0.7 10*3/uL (ref 0.1–1.0)
Monocytes Relative: 23 %
Neutro Abs: 1.6 10*3/uL — ABNORMAL LOW (ref 1.7–7.7)
Neutrophils Relative %: 56 %
Platelet Count: 143 10*3/uL — ABNORMAL LOW (ref 150–400)
RBC: 4.54 MIL/uL (ref 4.22–5.81)
RDW: 14.5 % (ref 11.5–15.5)
WBC Count: 2.9 10*3/uL — ABNORMAL LOW (ref 4.0–10.5)
nRBC: 0 % (ref 0.0–0.2)

## 2020-02-02 MED ORDER — SODIUM CHLORIDE 0.9 % IV SOLN
10.0000 mg | Freq: Once | INTRAVENOUS | Status: AC
  Administered 2020-02-02: 10 mg via INTRAVENOUS
  Filled 2020-02-02: qty 10

## 2020-02-02 MED ORDER — OXALIPLATIN CHEMO INJECTION 100 MG/20ML
130.0000 mg/m2 | Freq: Once | INTRAVENOUS | Status: AC
  Administered 2020-02-02: 245 mg via INTRAVENOUS
  Filled 2020-02-02: qty 40

## 2020-02-02 MED ORDER — PALONOSETRON HCL INJECTION 0.25 MG/5ML
INTRAVENOUS | Status: AC
  Filled 2020-02-02: qty 5

## 2020-02-02 MED ORDER — DEXTROSE 5 % IV SOLN
Freq: Once | INTRAVENOUS | Status: AC
  Filled 2020-02-02: qty 250

## 2020-02-02 MED ORDER — PALONOSETRON HCL INJECTION 0.25 MG/5ML
0.2500 mg | Freq: Once | INTRAVENOUS | Status: AC
Start: 2020-02-02 — End: 2020-02-02
  Administered 2020-02-02: 0.25 mg via INTRAVENOUS

## 2020-02-02 MED ORDER — PROCHLORPERAZINE MALEATE 10 MG PO TABS: 10.0000 mg | ORAL_TABLET | Freq: Four times a day (QID) | ORAL | 1 refills | Status: DC | PRN

## 2020-02-02 MED FILL — PROCHLORPERAZINE 10 MG TAB: 10 | 7 days supply | Qty: 30 | Fill #0

## 2020-02-02 NOTE — Patient Instructions (Signed)
[facility] Center Discharge Instructions for Patients Receiving Chemotherapy  Today you received the following chemotherapy agents Oxaliplatin (ELOXATIN).  To help prevent nausea and vomiting after your treatment, we encourage you to take your nausea medication as prescribed.   If you develop nausea and vomiting that is not controlled by your nausea medication, call the clinic.   BELOW ARE SYMPTOMS THAT SHOULD BE REPORTED IMMEDIATELY:  *FEVER GREATER THAN 100.5 F  *CHILLS WITH OR WITHOUT FEVER  NAUSEA AND VOMITING THAT IS NOT CONTROLLED WITH YOUR NAUSEA MEDICATION  *UNUSUAL SHORTNESS OF BREATH  *UNUSUAL BRUISING OR BLEEDING  TENDERNESS IN MOUTH AND THROAT WITH OR WITHOUT PRESENCE OF ULCERS  *URINARY PROBLEMS  *BOWEL PROBLEMS  UNUSUAL RASH Items with * indicate a potential emergency and should be followed up as soon as possible.  Feel free to call the clinic should you have any questions or concerns. The clinic phone number is [phone].  Please show the CHEMO ALERT CARD at check-in to the Emergency Department and triage nurse.   

## 2020-02-02 NOTE — Telephone Encounter (Signed)
Scheduled appointments per 1/6 los. Spoke to patient who is aware of appointments date and times.  

## 2020-02-02 NOTE — Progress Notes (Signed)
Per Dr. Mosetta Putt: okay to treat with ANC of 1.6 and elevated HR of 103.

## 2020-02-20 NOTE — Progress Notes (Signed)
[place]   [phone] [phone]   Clinic Follow up Note   Patient Care Team: [name], MD as PCP - General (Hematology) Jonnie Finner, RN as Oncology Nurse Navigator [name], MD as Consulting Physician (Oncology)  Date of Service:  02/23/2020  CHIEF COMPLAINT: F/u ofMiddleEsophageal Squamous CellCarcinoma  SUMMARY OF ONCOLOGIC HISTORY: Oncology History Overview Note  Cancer Staging Malignant neoplasm of middle third of esophagus ([place]) Staging form: Esophagus - Squamous Cell Carcinoma, AJCC 8th Edition - Clinical: Stage Unknown (cTX, cN1, cM0) - Signed by [name], MD on 08/18/2019    Malignant neoplasm of middle third of esophagus [facility])   Initial Diagnosis   Malignant neoplasm of middle third of esophagus ([place])   08/11/2019 Imaging   CT CAP W contrast 08/11/19 IMPRESSION: 1. Mid to lower esophageal mass along an 8 cm segment, large endoluminal component, strongly favoring esophageal malignancy. Borderline enlarged AP window lymph node. No findings of distant metastatic disease. 2. Trace bilateral pleural effusions. 3. Diffuse hepatic steatosis. 4. Cholelithiasis. 5. Fatty spermatic cords likely due to small bilateral indirect inguinal hernias.   08/11/2019 Procedure   EGD by Dr Carlean Purl  IMPRESSION - Partially obstructing, likely malignant esophageal tumor was found in the upper third of the esophagus and in the middle third of the esophagus. Biopsied. Very large and long - difficult but able to advance scope by this lesion - await CT for better length estimate - Normal stomach. - Normal examined duodenum.   08/11/2019 Initial Biopsy   FINAL MICROSCOPIC DIAGNOSIS:   A. ESOPHAGUS, UPPER, BIOPSY:  - Squamous cell carcinoma.    08/18/2019 Cancer Staging   Staging form: Esophagus - Squamous Cell Carcinoma, AJCC 8th Edition - Clinical: Stage Unknown (cTX, cN1, cM0) - Signed by [name], MD on 08/18/2019   08/29/2019 -  10/04/2019 Chemotherapy   Concurrent chemoradiation with weekly CT for 6 weeks starting 08/29/19-10/04/19    08/29/2019 - 10/06/2019 Radiation Therapy   Concurrent chemoradiation with Dr Lisbeth Renshaw starting 08/29/19-10/06/19   11/11/2019 PET scan   IMPRESSION: 1. Again seen is a long segment of FDG avid wall thickening of the distal half of the thoracic esophagus compatible with known esophageal neoplasm. 2. No signs of FDG avid nodal or distant metastatic disease.     11/28/2019 Procedure   EUS by Dr Rush Landmark IMPRESSION EGD Impression: - No gross lesions in esophagus proximally. - Previously noted esophageal cancer is not present. However, in its place and extending distal to this is likely LA Grade D chemotherapy/radiation esophagitis with bleeding. Biopsied after the EUS was complete to evaluate for persistent disease or not. - Z-line regular, 37 cm from the incisors. - 3 cm hiatal hernia. - Erythematous mucosa in the stomach. No other gross lesions in the stomach. Biopsied. - No gross lesions in the duodenal bulb, in the first portion of the duodenum and in the second portion of the duodenum. EUS Impression: - Wall thickening was seen in the thoracic esophagus into the gastroesophageal junction. The thickening appeared to primarily be within the luminal interface/superficial mucosa (Layer 1) and deep mucosa (Layer 2). The previously noted esophageal cancer has been replaced by what appears to be radiation/chemotherapy induced changes. With the limitations of having to use the mini-probe EUS, I could not visualize persistent significant disease other than the entire distal area of the esophagus having likely changes noted as above. - No malignant-appearing lymph nodes were visualized in the middle paraesophageal mediastinum (level 26M), lower paraesophageal mediastinum (  level 8L), diaphragmatic region (level 15) and paracardial region (level 16).      FINAL MICROSCOPIC DIAGNOSIS:   A.  STOMACH, BIOPSY:  - Mildly active chronic gastritis.  - Warthin-Starry negative for Helicobacter pylori.  - No intestinal metaplasia, dysplasia or carcinoma.   B. ESOPHAGUS, BIOPSY:  - Inflamed granulation tissue and exudate consistent with ulcer.  - No malignancy identified.    12/20/2019 -  Chemotherapy   CAPOX q3weeks with Xeloda 1568m BID 2 weeks on/1 week off starting 12/20/19      CURRENT THERAPY:  CAPOX q3weeks withXeloda 15049mBID 2 weeks on/1 week off starting11/23/21  INTERVAL HISTORY:  [name] here for a follow up. He presents to the clinic with his sister. He noticed mild dysphagia with solid food for past week, still able to swallow with liquid, no throat pain, cough or other new symptoms. He knows to avoid cold drinks or food. He otherwise tolerated last cycle chemotherapy well. He still has moderate left hip pain, which started after his injury about a month ago.   All other systems were reviewed with the patient and are negative.  MEDICAL HISTORY:  Past Medical History:  Diagnosis Date  . GERD (gastroesophageal reflux disease)   . Hematemesis with nausea 08/10/2019    SURGICAL HISTORY: Past Surgical History:  Procedure Laterality Date  . BIOPSY  08/11/2019   Procedure: BIOPSY;  Surgeon: GeGatha MayerMD;  [facility];  Service: Endoscopy;;  . BIOPSY  11/28/2019   Procedure: BIOPSY;  Surgeon: MaIrving Copas MD;  [facility]: G[facility]ology;;  . ESOPHAGOGASTRODUODENOSCOPY  08/11/2019  . ESOPHAGOGASTRODUODENOSCOPY (EGD) WITH PROPOFOL N/A 08/11/2019   Procedure: ESOPHAGOGASTRODUODENOSCOPY (EGD) WITH PROPOFOL;  Surgeon: GeGatha MayerMD;  [facility];  Laterality: N/A;  . ESOPHAGOGASTRODUODENOSCOPY (EGD) WITH PROPOFOL N/A 11/28/2019   Procedure: ESOPHAGOGASTRODUODENOSCOPY (EGD) WITH PROPOFOL;  Surgeon: MaRush LandmarkaTelford Nab MD;  [facility]  Service: Gastroenterology;  Laterality: N/A;  . EUS N/A 11/28/2019   Procedure: UPPER ENDOSCOPIC ULTRASOUND (EUS) RADIAL;  Surgeon: MaRush LandmarkaTelford Nab MD;  [facility]: [facility];  Laterality: N/A;    I have reviewed the social history and family history with the patient and they are unchanged from previous note.  ALLERGIES:  has No Known Allergies.  MEDICATIONS:  Current Outpatient Medications  Medication Sig Dispense Refill  . dexamethasone (DECADRON) 4 MG tablet Take 2 tablets (8 mg total) by mouth daily. Start the day after chemotherapy for 2 days. Take with food. 30 tablet 1  . diphenhydrAMINE-APAP, sleep, (TYLENOL PM EXTRA STRENGTH) 50-1000 MG/30ML LIQD Take 15 mLs by mouth at bedtime as needed (sleep).    . famotidine (PEPCID) 20 MG tablet Take 1 tablet (20 mg total) by mouth 2 (two) times daily. 60 tablet 3  . Ferrous Sulfate (IRON) 325 (65 Fe) MG TABS Take 1 tablet (325 mg total) by mouth 2 (two) times daily. 60 tablet 3  . HYDROcodone-acetaminophen (HYCET) 7.5-325 mg/15 ml solution Take 15 mLs by mouth every 6 (six) hours as needed for moderate pain. (Patient taking differently: Take 15 mLs by mouth 2 (two) times daily as needed for moderate pain.) 473 mL 0  . lidocaine-prilocaine (EMLA) cream Apply to affected area once 30 g 3  . LORazepam (ATIVAN) 0.5 MG tablet Take 1 tablet (0.5 mg total) by mouth every 6 (six) hours as needed (Nausea or vomiting). 30 tablet 0  . ondansetron (ZOFRAN) 8 MG  tablet Take 1 tablet (8 mg total) by mouth 2 (two) times daily as needed for refractory nausea / vomiting. Start on day 3 after chemotherapy. 30 tablet 1  . prochlorperazine (COMPAZINE) 10 MG tablet Take 1 tablet (10 mg total) by mouth every 6 (six) hours as needed (Nausea or vomiting). 30 tablet 1  . sucralfate (CARAFATE) 1 g tablet Take 1 tablet (1 g total) by mouth 4 (four) times daily. Dissolve each tablet in 15 cc water before use. 120 tablet 2  . XELODA 500  MG tablet Take 3 tablets (1,500 mg total) by mouth 2 (two) times daily after a meal. Take 14 days on, 7 days off, repeat every 21 days. 84 tablet 0   No current facility-administered medications for this visit.   Facility-Administered Medications Ordered in Other Visits  Medication Dose Route Frequency Provider Last Rate Last Admin  . oxaliplatin (ELOXATIN) 245 mg in dextrose 5 % 500 mL chemo infusion  130 mg/m2 (Treatment Plan Recorded) Intravenous Once [name], MD 275 mL/hr at 02/23/20 1135 245 mg at 02/23/20 1135    PHYSICAL EXAMINATION: ECOG PERFORMANCE STATUS: 1 - Symptomatic but completely ambulatory  Vitals:   02/23/20 0951  BP: 120/81  Pulse: 89  Resp: 14  Temp: 98.1 F (36.7 C)  SpO2: 99%   Filed Weights   02/23/20 0951  Weight: 163 lb 6.4 oz (74.1 kg)    GENERAL:alert, no distress and comfortable SKIN: skin color, texture, turgor are normal, no rashes or significant lesions EYES: normal, Conjunctiva are pink and non-injected, sclera clear Musculoskeletal:no cyanosis of digits and no clubbing  NEURO: alert & oriented x 3 with fluent speech, no focal motor/sensory deficits  LABORATORY DATA:  I have reviewed the data as listed CBC Latest Ref Rng & Units 02/23/2020 02/02/2020 01/12/2020  WBC 4.0 - 10.5 K/uL 2.7(L) 2.9(L) 3.4(L)  Hemoglobin 13.0 - 17.0 g/dL 13.3 14.6 14.3  Hematocrit 39.0 - 52.0 % 39.1 41.2 41.0  Platelets 150 - 400 K/uL 103(L) 143(L) 155     CMP Latest Ref Rng & Units 02/23/2020 02/02/2020 01/12/2020  Glucose 70 - 99 mg/dL 113(H) 109(H) 99  BUN 6 - 20 mg/dL 4(L) 6 <4(L)  Creatinine 0.61 - 1.24 mg/dL 0.61 0.66 0.72  Sodium 135 - 145 mmol/L 136 137 141  Potassium 3.5 - 5.1 mmol/L 3.6 3.5 3.9  Chloride 98 - 111 mmol/L 104 104 105  CO2 22 - 32 mmol/L '23 24 29  ' Calcium 8.9 - 10.3 mg/dL 8.3(L) 9.2 9.0  Total Protein 6.5 - 8.1 g/dL 7.3 8.1 7.8  Total Bilirubin 0.3 - 1.2 mg/dL 0.7 0.7 0.4  Alkaline Phos 38 - 126 U/L 107 102 96  AST 15 - 41 U/L 44(H) 33  37  ALT 0 - 44 U/L '18 23 25      ' RADIOGRAPHIC STUDIES: I have personally reviewed the radiological images as listed and agreed with the findings in the report. No results found.   ASSESSMENT & PLAN:  Jimmy Hawkins is a 45 y.o. male with    1.[name], cTxN1M0 -Hewas diagnosed in 07/2019 withpresented withSquamous Cell Carcinoma as seen on EGD.08/11/19 CT CAP found him to havemidesophageal mass and enlarged AP window node, no distant metastasis.  -He completed 6 weeks of concurrent chemoRT withweekly Carboplatin and Taxol 08/29/19-10/06/19 -Repeat PET scan October 2021 showedhypermetabolic wall thickening of the distal half of the thoracic esophageus,no notable distant metastasis. Dr. Kipp Brood has reviewed the PET scan, and feels that his tumor invadestracheais  it'snot resectable. He will f/u with Dr Kipp Brood on 01/06/20. -EUS on 11/1/2021showed thickening of esophageal wallattumor site, no visible tumor, biopsy of the mucosa was negative for malignant cells. -He had a second opinion consult with Dr. Olen Pel on 01/04/20 and is going to have bronchoscopy to determine if his tumor is resectable. Pt would like to wait until he completes chemo -I started him on consolidation chemotherapy with CAPOX q3weeks withXeloda 1529m BID 2 weeks on/1 week offstarting 12/20/19. He has had 3 cycles, overall tolerated well. -lab reviewed, adequate for treatment, will proceed cycle 4 CAPOX today. Pt has not received Xeloda yet, we gave him the pharmacy number to call today and set up his shipment. He will start as soon as he receives it  -lab and f/u on 2/7  -I encouraged him to call [facility] back to schedule his bronchoscopy, OK to do anytime after 2 weeks from now    2.Moderate anemia secondary to GI bleeding and probable iron deficiency -He has hadrecurrentNausea and vomiting withhematemesissince 01/2019.  -He required blood transfusion on 08/15/19  and IV Feraheme on 7/16/21and 08/22/19. -Continue oral iron. -anemiaresolved. Will monitor on consolidation chemo.  3. Left hip pain after injury  -x-ray was negative for fracture  -will refer him to orthopedics   4.H/oHeavy alcohol user.  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 -He has met withfinancial advocate Shauna and SLoreli Dollarhe has grant assistance with medications. Ipreviouslydiscussed for grant use [facility]   PLAN: -lab reviewed, mild neutropenia and thrombocytopenia, adequate for treatment, will proceed to cycle 4 CAPOX today at same dose, this is the last schedule chemo -He will call pharmacy to set up his Xeloda shipment  -lab and f/u on 2/7  -he will contact [facility] to schedule his bronchoscopy -referral to orthopedics for left hip pain    No problem-specific Assessment & Plan notes found for this encounter.   Orders Placed This Encounter  Procedures  . AMB referral to orthopedics    Referral Priority:   Elective    Referral Type:   Consultation    Number of Visits Requested:   1   All questions were answered. The patient knows to call the clinic with any problems, questions or concerns. No barriers to learning was detected. The total time spent in the appointment was 30 minutes.     YTruitt Merle MD 02/23/2020   I, AJoslyn Devon am acting as scribe for YTruitt Merle MD.   I have reviewed the above documentation for accuracy and completeness, and I agree with the above.

## 2020-02-23 ENCOUNTER — Inpatient Hospital Stay (HOSPITAL_BASED_OUTPATIENT_CLINIC_OR_DEPARTMENT_OTHER): Payer: Self-pay | Admitting: Hematology

## 2020-02-23 ENCOUNTER — Inpatient Hospital Stay: Payer: Self-pay

## 2020-02-23 ENCOUNTER — Other Ambulatory Visit: Payer: Self-pay

## 2020-02-23 ENCOUNTER — Encounter: Payer: Self-pay | Admitting: Hematology

## 2020-02-23 VITALS — BP 120/81 | HR 89 | Temp 98.1°F | Resp 14 | Ht 66.0 in | Wt 163.4 lb

## 2020-02-23 DIAGNOSIS — C154 Malignant neoplasm of middle third of esophagus: Secondary | ICD-10-CM

## 2020-02-23 LAB — CMP (CANCER CENTER ONLY)
ALT: 18 U/L (ref 0–44)
AST: 44 U/L — ABNORMAL HIGH (ref 15–41)
Albumin: 3.3 g/dL — ABNORMAL LOW (ref 3.5–5.0)
Alkaline Phosphatase: 107 U/L (ref 38–126)
Anion gap: 9 (ref 5–15)
BUN: 4 mg/dL — ABNORMAL LOW (ref 6–20)
CO2: 23 mmol/L (ref 22–32)
Calcium: 8.3 mg/dL — ABNORMAL LOW (ref 8.9–10.3)
Chloride: 104 mmol/L (ref 98–111)
Creatinine: 0.61 mg/dL (ref 0.61–1.24)
GFR, Estimated: >60 mL/min (ref >60)
Glucose, Bld: 113 mg/dL — ABNORMAL HIGH (ref 70–99)
Potassium: 3.6 mmol/L (ref 3.5–5.1)
Sodium: 136 mmol/L (ref 135–145)
Total Bilirubin: 0.7 mg/dL (ref 0.3–1.2)
Total Protein: 7.3 g/dL (ref 6.5–8.1)

## 2020-02-23 LAB — CBC WITH DIFFERENTIAL (CANCER CENTER ONLY)
Abs Immature Granulocytes: 0.01 10*3/uL (ref 0.00–0.07)
Basophils Absolute: 0 10*3/uL (ref 0.0–0.1)
Basophils Relative: 0 %
Eosinophils Absolute: 0 10*3/uL (ref 0.0–0.5)
Eosinophils Relative: 1 %
HCT: 39.1 % (ref 39.0–52.0)
Hemoglobin: 13.3 g/dL (ref 13.0–17.0)
Immature Granulocytes: 0 %
Lymphocytes Relative: 15 %
Lymphs Abs: 0.4 10*3/uL — ABNORMAL LOW (ref 0.7–4.0)
MCH: 32.4 pg (ref 26.0–34.0)
MCHC: 34 g/dL (ref 30.0–36.0)
MCV: 95.4 fL (ref 80.0–100.0)
Monocytes Absolute: 0.7 10*3/uL (ref 0.1–1.0)
Monocytes Relative: 26 %
Neutro Abs: 1.5 10*3/uL — ABNORMAL LOW (ref 1.7–7.7)
Neutrophils Relative %: 58 %
Platelet Count: 103 10*3/uL — ABNORMAL LOW (ref 150–400)
RBC: 4.1 MIL/uL — ABNORMAL LOW (ref 4.22–5.81)
RDW: 15.9 % — ABNORMAL HIGH (ref 11.5–15.5)
WBC Count: 2.7 10*3/uL — ABNORMAL LOW (ref 4.0–10.5)
nRBC: 0 % (ref 0.0–0.2)

## 2020-02-23 MED ORDER — PALONOSETRON HCL INJECTION 0.25 MG/5ML
INTRAVENOUS | Status: AC
  Filled 2020-02-23: qty 5

## 2020-02-23 MED ORDER — SODIUM CHLORIDE 0.9 % IV SOLN
10.0000 mg | Freq: Once | INTRAVENOUS | Status: AC
  Administered 2020-02-23: 10 mg via INTRAVENOUS
  Filled 2020-02-23: qty 10

## 2020-02-23 MED ORDER — DEXTROSE 5 % IV SOLN
Freq: Once | INTRAVENOUS | Status: AC
  Filled 2020-02-23: qty 250

## 2020-02-23 MED ORDER — OXALIPLATIN CHEMO INJECTION 100 MG/20ML
130.0000 mg/m2 | Freq: Once | INTRAVENOUS | Status: AC
  Administered 2020-02-23: 245 mg via INTRAVENOUS
  Filled 2020-02-23: qty 49

## 2020-02-23 MED ORDER — PALONOSETRON HCL INJECTION 0.25 MG/5ML
0.2500 mg | Freq: Once | INTRAVENOUS | Status: AC
  Administered 2020-02-23: 0.25 mg via INTRAVENOUS

## 2020-02-23 MED ORDER — XELODA 500 MG PO TABS: 850.0000 mg/m2 | ORAL_TABLET | Freq: Two times a day (BID) | ORAL | 0 refills | Status: DC

## 2020-02-23 NOTE — Patient Instructions (Signed)
[facility] Discharge Instructions for Patients Receiving Chemotherapy  Today you received the following chemotherapy agents Oxaliplatin (ELOXATIN).  To help prevent nausea and vomiting after your treatment, we encourage you to take your nausea medication as prescribed.   If you develop nausea and vomiting that is not controlled by your nausea medication, call the clinic.   BELOW ARE SYMPTOMS THAT SHOULD BE REPORTED IMMEDIATELY:  *FEVER GREATER THAN 100.5 F  *CHILLS WITH OR WITHOUT FEVER  NAUSEA AND VOMITING THAT IS NOT CONTROLLED WITH YOUR NAUSEA MEDICATION  *UNUSUAL SHORTNESS OF BREATH  *UNUSUAL BRUISING OR BLEEDING  TENDERNESS IN MOUTH AND THROAT WITH OR WITHOUT PRESENCE OF ULCERS  *URINARY PROBLEMS  *BOWEL PROBLEMS  UNUSUAL RASH Items with * indicate a potential emergency and should be followed up as soon as possible.  Feel free to call the clinic should you have any questions or concerns. The clinic phone number is [phone].  Please show the CHEMO ALERT CARD at check-in to the Emergency Department and triage nurse.   

## 2020-02-24 ENCOUNTER — Telehealth: Payer: Self-pay | Admitting: Hematology

## 2020-02-24 NOTE — Telephone Encounter (Signed)
Scheduled appointments per 1/27 los. Spoke to patient's niece, Lesleigh Noe, who is aware of appointments date and times.

## 2020-03-02 NOTE — Progress Notes (Signed)
[place]   [phone] [phone]   Clinic Follow up Note   Patient Care Team: Jimmy Merle, Hawkins as PCP - General (Hematology) Jimmy Finner, RN as Oncology Nurse Navigator Jimmy Merle, Hawkins as Consulting Physician (Oncology)  Date of Service:  03/05/2020  CHIEF COMPLAINT: F/u ofMiddleEsophageal Squamous CellCarcinoma  SUMMARY OF ONCOLOGIC HISTORY: Oncology History Overview Note  Cancer Staging Malignant neoplasm of middle third of esophagus ([place]) Staging form: Esophagus - Squamous Cell Carcinoma, AJCC 8th Edition - Clinical: Stage Unknown (cTX, cN1, cM0) - Signed by Jimmy Merle, Hawkins on 08/18/2019    Malignant neoplasm of middle third of esophagus [facility])   Initial Diagnosis   Malignant neoplasm of middle third of esophagus ([place])   08/11/2019 Imaging   CT CAP W contrast 08/11/19 IMPRESSION: 1. Mid to lower esophageal mass along an 8 cm segment, large endoluminal component, strongly favoring esophageal malignancy. Borderline enlarged AP window lymph node. No findings of distant metastatic disease. 2. Trace bilateral pleural effusions. 3. Diffuse hepatic steatosis. 4. Cholelithiasis. 5. Fatty spermatic cords likely due to small bilateral indirect inguinal hernias.   08/11/2019 Procedure   EGD by Dr Jimmy Hawkins  IMPRESSION - Partially obstructing, likely malignant esophageal tumor was found in the upper third of the esophagus and in the middle third of the esophagus. Biopsied. Very large and long - difficult but able to advance scope by this lesion - await CT for better length estimate - Normal stomach. - Normal examined duodenum.   08/11/2019 Initial Biopsy   FINAL MICROSCOPIC DIAGNOSIS:   A. ESOPHAGUS, UPPER, BIOPSY:  - Squamous cell carcinoma.    08/18/2019 Cancer Staging   Staging form: Esophagus - Squamous Cell Carcinoma, AJCC 8th Edition - Clinical: Stage Unknown (cTX, cN1, cM0) - Signed by Jimmy Merle, Hawkins on 08/18/2019   08/29/2019 -  10/04/2019 Chemotherapy   Concurrent chemoradiation with weekly CT for 6 weeks starting 08/29/19-10/04/19    08/29/2019 - 10/06/2019 Radiation Therapy   Concurrent chemoradiation with Dr Jimmy Hawkins starting 08/29/19-10/06/19   11/11/2019 PET scan   IMPRESSION: 1. Again seen is a long segment of FDG avid wall thickening of the distal half of the thoracic esophagus compatible with known esophageal neoplasm. 2. No signs of FDG avid nodal or distant metastatic disease.     11/28/2019 Procedure   EUS by Dr Jimmy Hawkins IMPRESSION EGD Impression: - No gross lesions in esophagus proximally. - Previously noted esophageal cancer is not present. However, in its place and extending distal to this is likely LA Grade D chemotherapy/radiation esophagitis with bleeding. Biopsied after the EUS was complete to evaluate for persistent disease or not. - Z-line regular, 37 cm from the incisors. - 3 cm hiatal hernia. - Erythematous mucosa in the stomach. No other gross lesions in the stomach. Biopsied. - No gross lesions in the duodenal bulb, in the first portion of the duodenum and in the second portion of the duodenum. EUS Impression: - Wall thickening was seen in the thoracic esophagus into the gastroesophageal junction. The thickening appeared to primarily be within the luminal interface/superficial mucosa (Layer 1) and deep mucosa (Layer 2). The previously noted esophageal cancer has been replaced by what appears to be radiation/chemotherapy induced changes. With the limitations of having to use the mini-probe EUS, I could not visualize persistent significant disease other than the entire distal area of the esophagus having likely changes noted as above. - No malignant-appearing lymph nodes were visualized in the middle paraesophageal mediastinum (level 36M), lower paraesophageal mediastinum (  level 8L), diaphragmatic region (level 15) and paracardial region (level 16).      FINAL MICROSCOPIC DIAGNOSIS:   A.  STOMACH, BIOPSY:  - Mildly active chronic gastritis.  - Warthin-Starry negative for Helicobacter pylori.  - No intestinal metaplasia, dysplasia or carcinoma.   B. ESOPHAGUS, BIOPSY:  - Inflamed granulation tissue and exudate consistent with ulcer.  - No malignancy identified.    12/20/2019 - 02/2020 Chemotherapy   CAPOX q3weeks with Xeloda 1559m BID 2 weeks on/1 week off starting 12/20/19-02/2020      CURRENT THERAPY:  CAPOX q3weeks withXeloda 15043mBID 2 weeks on/1 week off starting11/23/21-02/2020  INTERVAL HISTORY:  ViTandre Hawkins here for a follow up. He presents to the clinic with his wife and SpPatent attorneyHe notes his last cycle went well. He still has cold sensitivity and tinging in his hands. He denies pain from this. He notes he still has not received his next Xeloda shipment. He [facility] not been [facility] by [facility]oped[facility] about his leg pain. He had small amount of fluid removed. He notes he is still drinking alcohol.    REVIEW OF SYSTEMS:   Constitutional: Denies fevers, chills or abnormal weight loss Eyes: Denies blurriness of vision Ears, nose, mouth, throat, and face: Denies mucositis or sore throat Respiratory: Denies cough, dyspnea or wheezes Cardiovascular: Denies palpitation, chest discomfort or lower extremity swelling Gastrointestinal:  Denies nausea, heartburn or change in bowel habits Skin: Denies abnormal skin rashes Lymphatics: Denies new lymphadenopathy or easy bruising Neurological:Denies numbness, tingling or new weaknesses Behavioral/Psych: Mood is stable, no new changes  All other systems were reviewed with the patient and are negative.  MEDICAL HISTORY:  Past Medical History:  Diagnosis Date  . GERD (gastroesophageal reflux disease)   . Hematemesis with nausea 08/10/2019    SURGICAL HISTORY: Past Surgical History:  Procedure Laterality Date  . BIOPSY  08/11/2019   Procedure: BIOPSY;  Surgeon: Jimmy Mayer Hawkins;  [facility];  Service: Endoscopy;;  . BIOPSY  11/28/2019   Procedure: BIOPSY;  Surgeon: MaIrving Copas [facility];  [facility] [facility]: Gastro[facility]ology;;  . ESOPHAGOGASTRODUODENOSCOPY  08/11/2019  . ESOPHAGOGASTRODUODENOSCOPY (EGD) WITH PROPOFOL N/A 08/11/2019   Procedure: ESOPHAGOGASTRODUODENOSCOPY (EGD) WITH PROPOFOL;  Surgeon: Jimmy MayerMD;  [facility];  Laterality: N/A;  . ESOPHAGOGASTRODUODENOSCOPY (EGD) WITH PROPOFOL N/A 11/28/2019   Procedure: ESOPHAGOGASTRODUODENOSCOPY (EGD) WITH PROPOFOL;  Surgeon: MaRush LandmarkaTelford Nab [facility];  [facility] Service: Gastroenterology;  Laterality: N/A;  . EUS N/A 11/28/2019   Procedure: UPPER ENDOSCOPIC ULTRASOUND (EUS) RADIAL;  Surgeon: MaRush LandmarkaTelford Nab [facility];  [facility] Service: Gastroenterology;  Laterality: N/A;    I have reviewed the social history and family history with the patient and they are unchanged from previous note.  ALLERGIES:  has No Known Allergies.  MEDICATIONS:  Current Outpatient Medications  Medication Sig Dispense Refill  . dexamethasone (DECADRON) 4 MG tablet Take 2 tablets (8 mg total) by mouth daily. Start the day after chemotherapy for 2 days. Take with food. 30 tablet 1  . diphenhydrAMINE-APAP, sleep, (TYLENOL PM EXTRA STRENGTH) 50-1000 MG/30ML LIQD Take 15 mLs by mouth at bedtime as needed (sleep).    . famotidine (PEPCID) 20 MG tablet Take 1 tablet (20 mg total) by mouth 2 (two) times daily. 60 tablet 3  . Ferrous Sulfate (IRON) 325 (65 Fe) MG TABS Take 1 tablet (325 mg total) by mouth 2 (two) times  daily. 60 tablet 3  . HYDROcodone-acetaminophen (HYCET) 7.5-325 mg/15 ml solution Take 15 mLs by mouth every 6 (six) hours as needed for moderate pain. (Patient taking differently: Take 15 mLs by mouth 2 (two) times daily as needed for moderate pain.) 473 mL 0  . lidocaine-prilocaine (EMLA) cream Apply to affected area once 30  g 3  . LORazepam (ATIVAN) 0.5 MG tablet Take 1 tablet (0.5 mg total) by mouth every 6 (six) hours as needed (Nausea or vomiting). 30 tablet 0  . ondansetron (ZOFRAN) 8 MG tablet Take 1 tablet (8 mg total) by mouth 2 (two) times daily as needed for refractory nausea / vomiting. Start on day 3 after chemotherapy. 30 tablet 1  . prochlorperazine (COMPAZINE) 10 MG tablet Take 1 tablet (10 mg total) by mouth every 6 (six) hours as needed (Nausea or vomiting). 30 tablet 1  . sucralfate (CARAFATE) 1 g tablet Take 1 tablet (1 g total) by mouth 4 (four) times daily. Dissolve each tablet in 15 cc water before use. 120 tablet 2  . XELODA 500 MG tablet Take 3 tablets (1,500 mg total) by mouth 2 (two) times daily after a meal. Take 14 days on, 7 days off, repeat every 21 days. 84 tablet 0   No current facility-administered medications for this visit.    PHYSICAL EXAMINATION: ECOG PERFORMANCE STATUS: 1 - Symptomatic but completely ambulatory  Vitals:   03/05/20 1338  BP: (!) 143/86  Pulse: 98  Resp: 16  Temp: (!) 97.3 F (36.3 C)  SpO2: 100%   Filed Weights   03/05/20 1338  Weight: 164 lb 6.4 oz (74.6 kg)    Due to COVID19 we will limit examination to appearance. Patient had no complaints.  GENERAL:alert, no distress and comfortable SKIN: skin color normal, no rashes or significant lesions EYES: normal, Conjunctiva are pink and non-injected, sclera clear  NEURO: alert & oriented x 3 with fluent speech   LABORATORY DATA:  I have reviewed the data as listed CBC Latest Ref Rng & Units 03/05/2020 02/23/2020 02/02/2020  WBC 4.0 - 10.5 K/uL 5.0 2.7(L) 2.9(L)  Hemoglobin 13.0 - 17.0 g/dL 14.2 13.3 14.6  Hematocrit 39.0 - 52.0 % 40.5 39.1 41.2  Platelets 150 - 400 K/uL 112(L) 103(L) 143(L)     CMP Latest Ref Rng & Units 03/05/2020 02/23/2020 02/02/2020  Glucose 70 - 99 mg/dL 105(H) 113(H) 109(H)  BUN 6 - 20 mg/dL 5(L) 4(L) 6  Creatinine 0.61 - 1.24 mg/dL 0.62 0.61 0.66  Sodium 135 - 145 mmol/L 136  136 137  Potassium 3.5 - 5.1 mmol/L 3.9 3.6 3.5  Chloride 98 - 111 mmol/L 102 104 104  CO2 22 - 32 mmol/L '23 23 24  ' Calcium 8.9 - 10.3 mg/dL 9.1 8.3(L) 9.2  Total Protein 6.5 - 8.1 g/dL 8.2(H) 7.3 8.1  Total Bilirubin 0.3 - 1.2 mg/dL 0.7 0.7 0.7  Alkaline Phos 38 - 126 U/L 101 107 102  AST 15 - 41 U/L 38 44(H) 33  ALT 0 - 44 U/L '20 18 23      ' RADIOGRAPHIC STUDIES: I have personally reviewed the radiological images as listed and agreed with the findings in the report. XR Knee 1-2 Views Left  Result Date: 03/05/2020 2 views of the left knee show no acute findings.    ASSESSMENT & PLAN:  Jimmy Hawkins is a 45 y.o. male with    1.[name], cTxN1M0 -Hewas diagnosed in 07/2019 withpresented withSquamous Cell Carcinoma as seen on EGD.08/11/19 CT  CAP found him to havemidesophageal mass and enlarged AP window node, no distant metastasis.  -He completed 6 weeks of concurrent chemoRT withweekly Carboplatin and Taxol 08/29/19-10/06/19 -Repeat PET scan October 2021 showedhypermetabolic wall thickening of the distal half of the thoracic esophageus,no notable distant metastasis. Dr. Kipp Brood has reviewed the PET scan, and feels that his tumor invadestracheais it'snot resectable. He will f/u with Dr Kipp Brood on 01/06/20. -EUS on 11/1/2021showed thickening of esophageal wallattumor site, no visible tumor, biopsy of the mucosa was negative for malignant cells. -He hada second opinion consult with Dr. Olen Pel on 01/04/20 and is going to havebronchoscopy to determine if his tumor is resectable. Pt would like to wait until he completes chemo -Istarted him onconsolidation chemotherapy with CAPOX q3weeks withXeloda 1512m BID 2 weeks on/1 week offstarting 12/20/19. -He still is waiting on his Xeloda shipment to come in in 1-2 days for his final cycle. He will complete in 02/2020. [facility] discussed he can proceed with  [facility] this week. He can start Xeloda as soon as he receives it.  -Labs reviewed, today. [facility]. IF he is not offered surgery or does not proceed with surgery, will monitor him under observation as his change of cure with chemo and radiation alone is low, or consider immunotherapy.  -F/u in 1 month with PET scan for restaging.   2.Moderate anemia secondary to GI bleeding and probable iron deficiency -He has hadrecurrentNausea and vomiting withhematemesissince 01/2019.  -He required blood transfusion on 08/15/19 and IV Feraheme on 7/16/21and 08/22/19. -Continue oral iron. -anemiaresolved. Will monitor on consolidation chemo.  3. Left hip pain after injury  -x-ray was negative for fracture  -He was seen by Ortho who notes this is related to injury.   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 -He has met withfinancial advocate Shauna and SLoreli Dollarhe has grant assistance with medications. Ipreviouslydiscussed for grant use [facility]   PLAN: -I refilled Compazine today  -he will start last cycle Xeloda in 1-2 days as soon as he receives it, for 2 weeks  -F/u in 1 month with lab and PET scan a few days before. -He will [facility]    No problem-specific Assessment & Plan notes found for this encounter.   Orders Placed This Encounter  Procedures  . NM PET Image Restag (PS) Skull Base To Thigh    Standing Status:   Future    Standing Expiration Date:   03/05/2021    Order Specific Question:   If indicated for the ordered procedure, I authorize the administration of a radiopharmaceutical per Radiology protocol    Answer:   Yes    Order Specific  Question:   [facility]?    [facility]  All questions were answered. The patient knows to call the clinic with any problems, questions or concerns. No barriers to learning was detected. The total time spent in the appointment was 30 minutes.     YTruitt Merle Hawkins 03/05/2020   I, AJoslyn Devon am acting as scribe for YTruitt Merle Hawkins.   I have reviewed the above documentation for accuracy and completeness, and I agree with the above.

## 2020-03-05 ENCOUNTER — Inpatient Hospital Stay: Payer: Self-pay | Attending: Hematology | Admitting: Hematology

## 2020-03-05 ENCOUNTER — Inpatient Hospital Stay: Payer: Self-pay

## 2020-03-05 ENCOUNTER — Other Ambulatory Visit: Payer: Self-pay

## 2020-03-05 ENCOUNTER — Ambulatory Visit: Payer: Self-pay | Admitting: Orthopaedic Surgery

## 2020-03-05 ENCOUNTER — Ambulatory Visit (INDEPENDENT_AMBULATORY_CARE_PROVIDER_SITE_OTHER): Payer: Self-pay

## 2020-03-05 ENCOUNTER — Other Ambulatory Visit: Payer: Self-pay | Admitting: Hematology

## 2020-03-05 ENCOUNTER — Telehealth: Payer: Self-pay | Admitting: Hematology

## 2020-03-05 ENCOUNTER — Encounter: Payer: Self-pay | Admitting: Hematology

## 2020-03-05 VITALS — Ht 66.0 in | Wt 163.4 lb

## 2020-03-05 VITALS — BP 143/86 | HR 98 | Temp 97.3°F | Resp 16 | Ht 66.0 in | Wt 164.4 lb

## 2020-03-05 DIAGNOSIS — D500 Iron deficiency anemia secondary to blood loss (chronic): Secondary | ICD-10-CM | POA: Insufficient documentation

## 2020-03-05 DIAGNOSIS — F1011 Alcohol abuse, in remission: Secondary | ICD-10-CM | POA: Insufficient documentation

## 2020-03-05 DIAGNOSIS — C154 Malignant neoplasm of middle third of esophagus: Secondary | ICD-10-CM

## 2020-03-05 DIAGNOSIS — M25562 Pain in left knee: Secondary | ICD-10-CM

## 2020-03-05 DIAGNOSIS — S79912D Unspecified injury of left hip, subsequent encounter: Secondary | ICD-10-CM | POA: Insufficient documentation

## 2020-03-05 DIAGNOSIS — M25552 Pain in left hip: Secondary | ICD-10-CM

## 2020-03-05 LAB — CBC WITH DIFFERENTIAL (CANCER CENTER ONLY)
Abs Immature Granulocytes: 0.05 10*3/uL (ref 0.00–0.07)
Basophils Absolute: 0 10*3/uL (ref 0.0–0.1)
Basophils Relative: 1 %
Eosinophils Absolute: 0 10*3/uL (ref 0.0–0.5)
Eosinophils Relative: 0 %
HCT: 40.5 % (ref 39.0–52.0)
Hemoglobin: 14.2 g/dL (ref 13.0–17.0)
Immature Granulocytes: 1 %
Lymphocytes Relative: 18 %
Lymphs Abs: 0.9 10*3/uL (ref 0.7–4.0)
MCH: 33.1 pg (ref 26.0–34.0)
MCHC: 35.1 g/dL (ref 30.0–36.0)
MCV: 94.4 fL (ref 80.0–100.0)
Monocytes Absolute: 0.9 10*3/uL (ref 0.1–1.0)
Monocytes Relative: 18 %
Neutro Abs: 3.1 10*3/uL (ref 1.7–7.7)
Neutrophils Relative %: 62 %
Platelet Count: 112 10*3/uL — ABNORMAL LOW (ref 150–400)
RBC: 4.29 MIL/uL (ref 4.22–5.81)
RDW: 16.6 % — ABNORMAL HIGH (ref 11.5–15.5)
WBC Count: 5 10*3/uL (ref 4.0–10.5)
nRBC: 0 % (ref 0.0–0.2)

## 2020-03-05 LAB — CMP (CANCER CENTER ONLY)
ALT: 20 U/L (ref 0–44)
AST: 38 U/L (ref 15–41)
Albumin: 3.7 g/dL (ref 3.5–5.0)
Alkaline Phosphatase: 101 U/L (ref 38–126)
Anion gap: 11 (ref 5–15)
BUN: 5 mg/dL — ABNORMAL LOW (ref 6–20)
CO2: 23 mmol/L (ref 22–32)
Calcium: 9.1 mg/dL (ref 8.9–10.3)
Chloride: 102 mmol/L (ref 98–111)
Creatinine: 0.62 mg/dL (ref 0.61–1.24)
GFR, Estimated: >60 mL/min (ref >60)
Glucose, Bld: 105 mg/dL — ABNORMAL HIGH (ref 70–99)
Potassium: 3.9 mmol/L (ref 3.5–5.1)
Sodium: 136 mmol/L (ref 135–145)
Total Bilirubin: 0.7 mg/dL (ref 0.3–1.2)
Total Protein: 8.2 g/dL — ABNORMAL HIGH (ref 6.5–8.1)

## 2020-03-05 MED ORDER — PROCHLORPERAZINE MALEATE 10 MG PO TABS: 10.0000 mg | ORAL_TABLET | Freq: Four times a day (QID) | ORAL | 1 refills | Status: DC | PRN

## 2020-03-05 MED FILL — PROCHLORPERAZINE 10 MG TAB: 10 | 7 days supply | Qty: 30 | Fill #0

## 2020-03-05 NOTE — Progress Notes (Signed)
Office Visit Note   Patient: Jimmy Hawkins           Date of Birth: 11-07-1975           MRN: [id number] Visit Date: 03/05/2020              Requested by: [name], MD [address] [place],  [place] [address] PCP: [name], MD   Assessment & Plan: Visit Diagnoses:  1. Acute pain of left knee   2. Pain in left hip     Plan: I did recommend a steroid injection of the trochanteric area of his left hip today and a left knee joint steroid injection.  He agreed to these and tolerated them well.  All questions and concerns were answered and addressed.  Follow-up can actually be as needed.  Follow-Up Instructions: Return if symptoms worsen or fail to improve.   Orders:  Orders Placed This Encounter  Procedures  . XR Knee 1-2 Views Left   No orders of the defined types were placed in this encounter.     Procedures: No procedures performed   Clinical Data: No additional findings.   Subjective: Chief Complaint  Patient presents with  . Left Hip - Pain  . 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  He has acute pain in his left knee is been going on for about a month and he points to the medial joint line as a source of his pain.  He has not been working right now he has a known diagnosis of esophageal cancer.  He did have some type of metal plate hit his knee recently.  A lot of his pain is also around the lateral aspect of his left hip.  He has x-rays of the hip on the canopy system.  He has never injured these areas before.  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  This may have contributed to the pain he is having.  He denies any groin pain.  He denies any locking catching with his left knee but his knee does swell.  Again, he is dealing with esophageal cancer and is currently on chemotherapy.  HPI  Review of Systems He currently denies  any fever, chills, nausea, vomiting  Objective: Vital Signs: Ht 5\' 6"  (1.676 m)   Wt 163 lb 6.4 oz (74.1 kg)   BMI 26.37 kg/m   Physical Exam He is alert and oriented x3 and in no acute distress Ortho Exam Examination of his left hip shows pain only over the trochanteric area and proximal IT band.  His range of motion is full and there is no pain in the groin in terms of the motion of his left hip in the groin pain.  His left knee exam shows some slight varus malalignment.  There is potentially a very small effusion.  His range of motion is full with no ligamentous instability.  His patellofemoral joint shows some slight crepitation.  There is negative McMurray's exam. Specialty Comments:  No specialty comments available.  Imaging: XR Knee 1-2 Views Left  Result Date: 03/05/2020 2 views of the left knee show no acute findings.  X-rays reviewed independently of his pelvis and left hip show no acute findings.  PMFS History: Patient Active Problem List   Diagnosis Date Noted  . Iron deficiency anemia 08/12/2019  . Upper GI bleed  08/11/2019  . Malignant neoplasm of middle third of esophagus ([place])   . Hematemesis with nausea 08/10/2019  . Syncope, vasovagal 08/10/2019  . Acute blood loss anemia 08/10/2019  . Alcohol abuse    Past Medical History:  Diagnosis Date  . GERD (gastroesophageal reflux disease)   . Hematemesis with nausea 08/10/2019    Family History  Problem Relation Age of Onset  . Stomach cancer Mother   . Healthy Father     Past Surgical History:  Procedure Laterality Date  . BIOPSY  08/11/2019   Procedure: BIOPSY;  Hawkins: [name], MD;  [facility] END[facility]COPY;  Service: Endoscopy;;  . BIOPSY  11/28/2019   Procedure: BIOPSY;  Hawkins: [name], MD;  Location: [place];  Service: Gastroenterology;;  . ESOPHAGOGASTRODUODENOSCOPY  08/11/2019  . ESOPHAGOGASTRODUODENOSCOPY (EGD) WITH PROPOFOL N/A 08/11/2019   Procedure:  ESOPHAGOGASTRODUODENOSCOPY (EGD) WITH PROPOFOL;  Hawkins: [name], MD;  Location: [place];  Service: Endoscopy;  Laterality: N/A;  . ESOPHAGOGASTRODUODENOSCOPY (EGD) WITH PROPOFOL N/A 11/28/2019   Procedure: ESOPHAGOGASTRODUODENOSCOPY (EGD) WITH PROPOFOL;  Hawkins: Rush Landmark Telford Nab., MD;  Location: [place];  Service: Gastroenterology;  Laterality: N/A;  . EUS N/A 11/28/2019   Procedure: UPPER ENDOSCOPIC ULTRASOUND (EUS) RADIAL;  Hawkins: Rush Landmark Telford Nab., MD;  Location: [place];  Service: Gastroenterology;  Laterality: N/A;   Social History   Occupational History  . Not on file  Tobacco Use  . Smoking status: Never Smoker  . Smokeless tobacco: Never Used  Vaping Use  . Vaping Use: Never used  Substance and Sexual Activity  . Alcohol use: Not Currently    Alcohol/week: 6.0 standard drinks    Types: 6 Cans of beer per week    Comment:  6 drinks/day  . Drug use: Never  . Sexual activity: Not on file

## 2020-03-05 NOTE — Telephone Encounter (Signed)
Scheduled appointments per 2/7 los. Spoke to patient who is aware of appointments dates and times. Gave patient calendar print out.  

## 2020-03-12 ENCOUNTER — Other Ambulatory Visit: Payer: Self-pay | Admitting: Hematology

## 2020-03-12 ENCOUNTER — Ambulatory Visit (INDEPENDENT_AMBULATORY_CARE_PROVIDER_SITE_OTHER): Payer: Self-pay | Admitting: Orthopaedic Surgery

## 2020-03-12 DIAGNOSIS — M25552 Pain in left hip: Secondary | ICD-10-CM

## 2020-03-12 MED ORDER — HYDROCODONE-ACETAMINOPHEN 7.5-325 MG/15ML PO SOLN: 15.0000 mL | Freq: Four times a day (QID) | ORAL | 0 refills | Status: DC | PRN

## 2020-03-12 MED FILL — HYDROCOD-APAP 7.5-325/15ML: 7.5-325 | 8 days supply | Qty: 473 | Fill #0

## 2020-03-12 NOTE — Progress Notes (Signed)
The patient is also a week ago for the first time.  This was for left knee pain and left hip pain.  His left hip was hurting of the trochanteric area and I provided an injection in that hip with a steroid.  This was on the trochanteric aspect.  Also aspirated fluid from his knee and place a steroid injection in his left knee.  He comes in today with worsening left hip pain.  He has been able to walk but is more comfortable for him to lay down.  He denies any back pain.  He does not speak English so an interpreter is with him.  He does have a history of esophageal cancer.  With him laying supine unable to put his left hip through internal and external rotation and his pain does not seem to be too severe but is definitely hurting.  There is also pain to palpation of the trochanteric area.  His knee does not having swelling in it today on that left side.  Her previous AP pelvis and lateral of the left hip from month ago showed no obvious fracture or malalignment or any cortical irregularities around the bone.  Given the severity and acute onset of his worsening left hip pain, a MRI of his left hip is warranted to rule out any type of pathology given his esophageal cancer diagnosis and history.  I will send in some hydrocodone which will be in the solution form to his pharmacy.  He will offload that hip for now.  We will see him back in follow-up once we have the MRI of his hip.

## 2020-03-13 ENCOUNTER — Other Ambulatory Visit: Payer: Self-pay

## 2020-03-13 DIAGNOSIS — M25552 Pain in left hip: Secondary | ICD-10-CM

## 2020-03-22 ENCOUNTER — Ambulatory Visit
Admission: RE | Admit: 2020-03-22 | Discharge: 2020-03-22 | Disposition: A | Discharge status: Home / Self Care | Payer: Self-pay | Source: Ambulatory Visit | Attending: Radiation Oncology | Admitting: Radiation Oncology

## 2020-03-22 ENCOUNTER — Other Ambulatory Visit: Payer: Self-pay

## 2020-03-22 DIAGNOSIS — C154 Malignant neoplasm of middle third of esophagus: Secondary | ICD-10-CM

## 2020-03-22 DIAGNOSIS — G893 Neoplasm related pain (acute) (chronic): Secondary | ICD-10-CM | POA: Insufficient documentation

## 2020-03-22 DIAGNOSIS — C158 Malignant neoplasm of overlapping sites of esophagus: Secondary | ICD-10-CM

## 2020-03-22 DIAGNOSIS — C7951 Secondary malignant neoplasm of bone: Secondary | ICD-10-CM

## 2020-03-22 NOTE — Progress Notes (Signed)
Radiation Oncology         [phone] ________________________________  Outpatient Re-Consultation - Conducted via telephone due to current COVID-19 concerns for limiting patient exposure  I spoke with the patient to conduct this consult visit via telephone to spare the patient unnecessary potential exposure in the healthcare setting during the current COVID-19 pandemic. The patient was notified in advance and was offered a [place] meeting to allow for face to face communication but unfortunately reported that they did not have the appropriate resources/technology to support such a visit and instead preferred to proceed with a telephone visit.   Name: Jatavis Malek        MRN: [id number]  Date of Service: 03/22/2020 DOB: Feb 07, 1975  GH:WEXH, Krista Blue, MD  [name], MD     REFERRING PHYSICIAN: [name], MD   DIAGNOSIS: The primary encounter diagnosis was Malignant neoplasm of middle third of esophagus ([place]). Diagnoses of Pain from bone metastases ([place]) and Bone metastases ([place]) were also pertinent to this visit.   HISTORY OF PRESENT ILLNESS: Jere Bostrom is a 45 y.o. male with a history diagnosed esophagus cancer involving upper and mid esophagus. A biopsy revealed squamous cell carcinoma. CT CAP on 08/11/19 revealed an 8 cm segment of thickening in the mid to lower esophagus with borderline AP window node and no evidence of distant metastatic disease. He had trace bilateral pleural effusions, cholelithiasis, and steatosis of the liver. Small bilateral indirect inguinal hernias were suspected as well. He completed chemoRT and was found to to be without disease at the conclusion by endoscopic evaluation due to progressive esophagitis confirmed by tissue sampling. He went on to receive consolidative CAPOX chemotherapy and his last PET in October 2021 did not show disease. He was being seen by Dr. Laverta Baltimore at [facility] to consider esophagectomy on 03/20/20 but had complained of  terrible left hip pain and he was sent for urgent MRI pelvis in the ED which showed a 4.9 cm mass in the left sacral ala with extension into the S1 sacral segment. It infiltrated with near complete replacement of the left S1 neural foramen and narrowing also of the left S2 neural foramen resulting in edema of the left iliacus muscle and displacement of the left iliac vessels anteriorly. The left sacral nerve roots were also encased and soft tissue also involved the perirectal fat. He was admitted for pain management and just discharged from the hospital today to return home to [facility] and consider additional treatment. He's contacted today with the help of [place] by phone.    PREVIOUS RADIATION THERAPY: Yes  08/29/19 - 10/06/19: The patient was treated to the esophagus to a dose of 45 Gy in 25 fractions using an IMRT technique. The patient then received a boost for an additional 5.4 Gy to yield a total dose of 50.4 Gy. An SIB technique was also used to treat the tumor to a dose of 50 Gy/6Gy for a total of 56 Gy total.   PAST MEDICAL HISTORY:  Past Medical History:  Diagnosis Date   GERD (gastroesophageal reflux disease)    Hematemesis with nausea 08/10/2019       PAST SURGICAL HISTORY: Past Surgical History:  Procedure Laterality Date   BIOPSY  08/11/2019   Procedure: BIOPSY;  Surgeon: [name], MD;  Location: [facility] ENDOSCOPY;  Service: Endoscopy;;   BIOPSY  11/28/2019   Procedure: BIOPSY;  Surgeon: [name], MD;  Location: [facility] ENDOSCOPY;  Service: Gastroenterology;;   ESOPHAGOGASTRODUODENOSCOPY  08/11/2019   ESOPHAGOGASTRODUODENOSCOPY (EGD) WITH PROPOFOL N/A 08/11/2019   Procedure: ESOPHAGOGASTRODUODENOSCOPY (EGD) WITH PROPOFOL;  Surgeon: [name], MD;  Location: [place];  Service: Endoscopy;  Laterality: N/A;   ESOPHAGOGASTRODUODENOSCOPY (EGD) WITH PROPOFOL N/A 11/28/2019   Procedure: ESOPHAGOGASTRODUODENOSCOPY (EGD) WITH PROPOFOL;   Surgeon: Rush Landmark Telford Nab., MD;  Location: [place];  Service: Gastroenterology;  Laterality: N/A;   EUS N/A 11/28/2019   Procedure: UPPER ENDOSCOPIC ULTRASOUND (EUS) RADIAL;  Surgeon: [name], MD;  Location: [place];  Service: Gastroenterology;  Laterality: N/A;     FAMILY HISTORY:  Family History  Problem Relation Age of Onset   Stomach cancer Mother    Healthy Father      SOCIAL HISTORY:  reports that he has never smoked. He has never used smokeless tobacco. He reports previous alcohol use of about 6.0 standard drinks of alcohol per week. He reports that he does not use drugs. The patient is originally from [place] and is spanish speaking. He is accompanied by his sister and his wife. He has 3 children, the youngest is at home and is 43 years old. He works in [employer]. His wife cleans homes.    ALLERGIES: Patient has no known allergies.   MEDICATIONS:  Current Outpatient Medications  Medication Sig Dispense Refill   dexamethasone (DECADRON) 4 MG tablet Take 2 tablets (8 mg total) by mouth daily. Start the day after chemotherapy for 2 days. Take with food. 30 tablet 1   diphenhydrAMINE-APAP, sleep, (TYLENOL PM EXTRA STRENGTH) 50-1000 MG/30ML LIQD Take 15 mLs by mouth at bedtime as needed (sleep).     famotidine (PEPCID) 20 MG tablet Take 1 tablet (20 mg total) by mouth 2 (two) times daily. 60 tablet 3   Ferrous Sulfate (IRON) 325 (65 Fe) MG TABS Take 1 tablet (325 mg total) by mouth 2 (two) times daily. 60 tablet 3   HYDROcodone-acetaminophen (HYCET) 7.5-325 mg/15 ml solution Take 15 mLs by mouth every 6 (six) hours as needed for moderate pain. 473 mL 0   lidocaine-prilocaine (EMLA) cream Apply to affected area once 30 g 3   LORazepam (ATIVAN) 0.5 MG tablet Take 1 tablet (0.5 mg total) by mouth every 6 (six) hours as needed (Nausea or vomiting). 30 tablet 0   ondansetron (ZOFRAN) 8 MG tablet Take 1 tablet (8 mg total) by  mouth 2 (two) times daily as needed for refractory nausea / vomiting. Start on day 3 after chemotherapy. 30 tablet 1   prochlorperazine (COMPAZINE) 10 MG tablet Take 1 tablet (10 mg total) by mouth every 6 (six) hours as needed (Nausea or vomiting). 30 tablet 1   sucralfate (CARAFATE) 1 g tablet Take 1 tablet (1 g total) by mouth 4 (four) times daily. Dissolve each tablet in 15 cc water before use. 120 tablet 2   XELODA 500 MG tablet Take 3 tablets (1,500 mg total) by mouth 2 (two) times daily after a meal. Take 14 days on, 7 days off, repeat every 21 days. 84 tablet 0   No current facility-administered medications for this encounter.     REVIEW OF SYSTEMS: On review of systems, the patient reports for several weeks he's been developing terrible lower left hip pain and low back pain. He reports he is taking the steroids he was given in the hospital. He has had improvement in his pain as well with medication. He denies loss of control of bowel or bladder. He denies any saddle anesthesia but complains of deep aching  pain that can also shoot into the hip and leg. No other complaints are noted.   PHYSICAL EXAM:  Unable to assess due to encounter type.  ECOG = 1  0 - Asymptomatic (Fully active, able to carry on all predisease activities without restriction)  1 - Symptomatic but completely ambulatory (Restricted in physically strenuous activity but ambulatory and able to carry out work of a light or sedentary nature. For example, light housework, office work)  2 - Symptomatic, <50% in bed during the day (Ambulatory and capable of all self care but unable to carry out any work activities. Up and about more than 50% of waking hours)  3 - Symptomatic, >50% in bed, but not bedbound (Capable of only limited self-care, confined to bed or chair 50% or more of waking hours)  4 - Bedbound (Completely disabled. Cannot carry on any self-care. Totally confined to bed or chair)  5 - Death   Eustace Pen MM,  Creech RH, Tormey DC, et al. [phone]). "Toxicity and response criteria of the [facility]". [place] Oncol. 5 (6): 649-55    LABORATORY DATA:  Lab Results  Component Value Date   WBC 5.0 03/05/2020   HGB 14.2 03/05/2020   HCT 40.5 03/05/2020   MCV 94.4 03/05/2020   PLT 112 (L) 03/05/2020   Lab Results  Component Value Date   NA 136 03/05/2020   K 3.9 03/05/2020   CL 102 03/05/2020   CO2 23 03/05/2020   Lab Results  Component Value Date   ALT 20 03/05/2020   AST 38 03/05/2020   ALKPHOS 101 03/05/2020   BILITOT 0.7 03/05/2020      RADIOGRAPHY: XR Knee 1-2 Views Left  Result Date: 03/05/2020 2 views of the left knee show no acute findings.      IMPRESSION/PLAN: 1. 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 Dr. Lisbeth Renshaw has reviewed the patient's case and I have discussed the findings to date with the patient. Dr. Burr Medico is also in agreement with the plan for palliative radiotherapy to the sacral spine, and is scheduling the patient for restaging PET. We discussed the risks, benefits, short, and long term effects of radiotherapy, as well as the palliative intent, and the patient is interested in proceeding. Dr. Lisbeth Renshaw recommends 2 weeks of radiotherapy to the sacral spine. He will come in tomorrow for simulation and begin treatment on Monday next week, 03/26/20. He is aware of the need to continue his steroids and pain medication as well.     Given current concerns for patient exposure during the COVID-19 pandemic, this encounter was conducted via telephone.  The patient has provided two factor identification and has given verbal consent for this type of encounter and has been advised to only accept a meeting of this type in a secure network environment. The time spent during this encounter was 35 minutes including preparation, discussion, and coordination of the patient's care. The  attendants for this meeting include Hayden Pedro  and Lillia Abed and Lowella Dandy, interpretor with [employer].  During the encounter, Hayden Pedro were located at [facility] Radiation Oncology Department.  Clavin Ruhlman was located at home.       Carola Rhine, PAC

## 2020-03-23 ENCOUNTER — Ambulatory Visit
Admission: RE | Admit: 2020-03-23 | Discharge: 2020-03-23 | Disposition: A | Discharge status: Home / Self Care | Payer: Self-pay | Source: Ambulatory Visit | Attending: Radiation Oncology | Admitting: Radiation Oncology

## 2020-03-23 ENCOUNTER — Other Ambulatory Visit: Payer: Self-pay

## 2020-03-23 DIAGNOSIS — C7951 Secondary malignant neoplasm of bone: Secondary | ICD-10-CM | POA: Insufficient documentation

## 2020-03-26 ENCOUNTER — Other Ambulatory Visit: Payer: Self-pay

## 2020-03-26 ENCOUNTER — Ambulatory Visit
Admission: RE | Admit: 2020-03-26 | Discharge: 2020-03-26 | Disposition: A | Discharge status: Home / Self Care | Payer: Self-pay | Source: Ambulatory Visit | Attending: Radiation Oncology | Admitting: Radiation Oncology

## 2020-03-27 ENCOUNTER — Ambulatory Visit: Payer: Self-pay

## 2020-03-28 ENCOUNTER — Ambulatory Visit (HOSPITAL_COMMUNITY): Admission: RE | Admit: 2020-03-28 | Payer: Self-pay | Source: Ambulatory Visit

## 2020-03-28 ENCOUNTER — Ambulatory Visit
Admission: RE | Admit: 2020-03-28 | Discharge: 2020-03-28 | Disposition: A | Discharge status: Home / Self Care | Payer: Self-pay | Source: Ambulatory Visit | Attending: Radiation Oncology | Admitting: Radiation Oncology

## 2020-03-28 DIAGNOSIS — C7951 Secondary malignant neoplasm of bone: Secondary | ICD-10-CM | POA: Insufficient documentation

## 2020-03-28 NOTE — Progress Notes (Incomplete)
Jimmy Hawkins   Telephone:([phone] Fax:(336) [phone]   Clinic Follow up Note   Patient Care Team: [name], MD as PCP - General (Hematology) Jonnie Finner, RN as Oncology Nurse Navigator [name], MD as Consulting Physician (Oncology)  Date of Service:  03/28/2020  CHIEF COMPLAINT: F/u ofMiddleEsophageal Squamous CellCarcinoma  SUMMARY OF ONCOLOGIC HISTORY: Oncology History Overview Note  Cancer Staging Malignant neoplasm of middle third of esophagus ([place]) Staging form: Esophagus - Squamous Cell Carcinoma, AJCC 8th Edition - Clinical: Stage Unknown (cTX, cN1, cM0) - Signed by [name], MD on 08/18/2019    Malignant neoplasm of middle third of esophagus [facility])   Initial Diagnosis   Malignant neoplasm of middle third of esophagus ([place])   08/11/2019 Imaging   CT CAP W contrast 08/11/19 IMPRESSION: 1. Mid to lower esophageal mass along an 8 cm segment, large endoluminal component, strongly favoring esophageal malignancy. Borderline enlarged AP window lymph node. No findings of distant metastatic disease. 2. Trace bilateral pleural effusions. 3. Diffuse hepatic steatosis. 4. Cholelithiasis. 5. Fatty spermatic cords likely due to small bilateral indirect inguinal hernias.   08/11/2019 Procedure   EGD by Dr Carlean Purl  IMPRESSION - Partially obstructing, likely malignant esophageal tumor was found in the upper third of the esophagus and in the middle third of the esophagus. Biopsied. Very large and long - difficult but able to advance scope by this lesion - await CT for better length estimate - Normal stomach. - Normal examined duodenum.   08/11/2019 Initial Biopsy   FINAL MICROSCOPIC DIAGNOSIS:   A. ESOPHAGUS, UPPER, BIOPSY:  - Squamous cell carcinoma.    08/18/2019 Cancer Staging   Staging form: Esophagus - Squamous Cell Carcinoma, AJCC 8th Edition - Clinical: Stage Unknown (cTX, cN1, cM0) - Signed by [name], MD on 08/18/2019   08/29/2019 -  10/04/2019 Chemotherapy   Concurrent chemoradiation with weekly CT for 6 weeks starting 08/29/19-10/04/19    08/29/2019 - 10/06/2019 Radiation Therapy   Concurrent chemoradiation with Dr Lisbeth Renshaw starting 08/29/19-10/06/19   11/11/2019 PET scan   IMPRESSION: 1. Again seen is a long segment of FDG avid wall thickening of the distal half of the thoracic esophagus compatible with known esophageal neoplasm. 2. No signs of FDG avid nodal or distant metastatic disease.     11/28/2019 Procedure   EUS by Dr Rush Landmark IMPRESSION EGD Impression: - No gross lesions in esophagus proximally. - Previously noted esophageal cancer is not present. However, in its place and extending distal to this is likely LA Grade D chemotherapy/radiation esophagitis with bleeding. Biopsied after the EUS was complete to evaluate for persistent disease or not. - Z-line regular, 37 cm from the incisors. - 3 cm hiatal hernia. - Erythematous mucosa in the stomach. No other gross lesions in the stomach. Biopsied. - No gross lesions in the duodenal bulb, in the first portion of the duodenum and in the second portion of the duodenum. EUS Impression: - Wall thickening was seen in the thoracic esophagus into the gastroesophageal junction. The thickening appeared to primarily be within the luminal interface/superficial mucosa (Layer 1) and deep mucosa (Layer 2). The previously noted esophageal cancer has been replaced by what appears to be radiation/chemotherapy induced changes. With the limitations of having to use the mini-probe EUS, I could not visualize persistent significant disease other than the entire distal area of the esophagus having likely changes noted as above. - No malignant-appearing lymph nodes were visualized in the middle paraesophageal mediastinum (level 91M), lower paraesophageal mediastinum (  level 8L), diaphragmatic region (level 15) and paracardial region (level 16).      FINAL MICROSCOPIC DIAGNOSIS:   A.  STOMACH, BIOPSY:  - Mildly active chronic gastritis.  - Warthin-Starry negative for Helicobacter pylori.  - No intestinal metaplasia, dysplasia or carcinoma.   B. ESOPHAGUS, BIOPSY:  - Inflamed granulation tissue and exudate consistent with ulcer.  - No malignancy identified.    12/20/2019 - 02/2020 Chemotherapy   CAPOX q3weeks with Xeloda 1530m BID 2 weeks on/1 week off starting 12/20/19-02/2020      CURRENT THERAPY:  CAPOX q3weeks withXeloda 15062mBID 2 weeks on/1 week off starting11/23/21-02/2020  INTERVAL HISTORY: *** Jimmy Yeattss here for a follow up. He presents to the clinic alone.    REVIEW OF SYSTEMS:  *** Constitutional: Denies fevers, chills or abnormal weight loss Eyes: Denies blurriness of vision Ears, nose, mouth, throat, and face: Denies mucositis or sore throat Respiratory: Denies cough, dyspnea or wheezes Cardiovascular: Denies palpitation, chest discomfort or lower extremity swelling Gastrointestinal:  Denies nausea, heartburn or change in bowel habits Skin: Denies abnormal skin rashes Lymphatics: Denies new lymphadenopathy or easy bruising Neurological:Denies numbness, tingling or new weaknesses Behavioral/Psych: Mood is stable, no new changes  All other systems were reviewed with the patient and are negative.  MEDICAL HISTORY:  Past Medical History:  Diagnosis Date  . GERD (gastroesophageal reflux disease)   . Hematemesis with nausea 08/10/2019    SURGICAL HISTORY: Past Surgical History:  Procedure Laterality Date  . BIOPSY  08/11/2019   Procedure: BIOPSY;  Surgeon: GeGatha MayerMD;  [facility];  Service: Endoscopy;;  . BIOPSY  11/28/2019   Procedure: BIOPSY;  Surgeon: MaIrving Copas MD;  [place] [facility] Service: Gastro[place]ology;;  . ESOPHAGOGASTRODUODENOSCOPY  08/11/2019  . ESOPHAGOGASTRODUODENOSCOPY (EGD) WITH PROPOFOL N/A 08/11/2019   Procedure: ESOPHAGOGASTRODUODENOSCOPY (EGD) WITH PROPOFOL;   Surgeon: GeGatha MayerMD;  [facility] Service: End[facility];  Laterality: N/A;  . ESOPHAGOGASTRODUODENOSCOPY (EGD) WITH PROPOFOL N/A 11/28/2019   Procedure: ESOPHAGOGASTRODUODENOSCOPY (EGD) WITH PROPOFOL;  Surgeon: MaRush LandmarkaTelford Nab MD;  [place] [facility] Service: Gastro[facility]ology;  Laterality: N/A;  . EUS N/A 11/28/2019   Procedure: UPPER ENDOSCOPIC ULTRASOUND (EUS) RADIAL;  Surgeon: MaRush LandmarkaTelford Nab MD;  [place] [facility] Service: G[place];  Laterality: N/A;    I have reviewed the social history and family history with the patient and they are unchanged from previous note.  ALLERGIES:  has No Known Allergies.  MEDICATIONS:  Current Outpatient Medications  Medication Sig Dispense Refill  . dexamethasone (DECADRON) 4 MG tablet Take 2 tablets (8 mg total) by mouth daily. Start the day after chemotherapy for 2 days. Take with food. 30 tablet 1  . diphenhydrAMINE-APAP, sleep, (TYLENOL PM EXTRA STRENGTH) 50-1000 MG/30ML LIQD Take 15 mLs by mouth at bedtime as needed (sleep).    . famotidine (PEPCID) 20 MG tablet Take 1 tablet (20 mg total) by mouth 2 (two) times daily. 60 tablet 3  . Ferrous Sulfate (IRON) 325 (65 Fe) MG TABS Take 1 tablet (325 mg total) by mouth 2 (two) times daily. 60 tablet 3  . HYDROcodone-acetaminophen (HYCET) 7.5-325 mg/15 ml solution Take 15 mLs by mouth every 6 (six) hours as needed for moderate pain. 473 mL 0  . lidocaine-prilocaine (EMLA) cream Apply to affected area once 30 g 3  . LORazepam (ATIVAN) 0.5 MG tablet Take 1 tablet (0.5 mg total) by mouth every 6 (six) hours as needed (Nausea or vomiting). 30 tablet  0  . ondansetron (ZOFRAN) 8 MG tablet Take 1 tablet (8 mg total) by mouth 2 (two) times daily as needed for refractory nausea / vomiting. Start on day 3 after chemotherapy. 30 tablet 1  . prochlorperazine (COMPAZINE) 10 MG tablet Take 1 tablet (10 mg total) by mouth every 6 (six) hours as needed (Nausea or  vomiting). 30 tablet 1  . sucralfate (CARAFATE) 1 g tablet Take 1 tablet (1 g total) by mouth 4 (four) times daily. Dissolve each tablet in 15 cc water before use. 120 tablet 2  . XELODA 500 MG tablet Take 3 tablets (1,500 mg total) by mouth 2 (two) times daily after a meal. Take 14 days on, 7 days off, repeat every 21 days. 84 tablet 0   No current facility-administered medications for this visit.    PHYSICAL EXAMINATION: ECOG PERFORMANCE STATUS: {CHL ONC ECOG [phone]}  There were no vitals filed for this visit. There were no vitals filed for this visit. *** GENERAL:alert, no distress and comfortable SKIN: skin color, texture, turgor are normal, no rashes or significant lesions EYES: normal, Conjunctiva are pink and non-injected, sclera clear {OROPHARYNX:no exudate, no erythema and lips, buccal mucosa, and tongue normal}  NECK: supple, thyroid normal size, non-tender, without nodularity LYMPH:  no palpable lymphadenopathy in the cervical, axillary {or inguinal} LUNGS: clear to auscultation and percussion with normal breathing effort HEART: regular rate & rhythm and no murmurs and no lower extremity edema ABDOMEN:abdomen soft, non-tender and normal bowel sounds Musculoskeletal:no cyanosis of digits and no clubbing  NEURO: alert & oriented x 3 with fluent speech, no focal motor/sensory deficits  LABORATORY DATA:  I have reviewed the data as listed CBC Latest Ref Rng & Units 03/05/2020 02/23/2020 02/02/2020  WBC 4.0 - 10.5 K/uL 5.0 2.7(L) 2.9(L)  Hemoglobin 13.0 - 17.0 g/dL 14.2 13.3 14.6  Hematocrit 39.0 - 52.0 % 40.5 39.1 41.2  Platelets 150 - 400 K/uL 112(L) 103(L) 143(L)     CMP Latest Ref Rng & Units 03/05/2020 02/23/2020 02/02/2020  Glucose 70 - 99 mg/dL 105(H) 113(H) 109(H)  BUN 6 - 20 mg/dL 5(L) 4(L) 6  Creatinine 0.61 - 1.24 mg/dL 0.62 0.61 0.66  Sodium 135 - 145 mmol/L 136 136 137  Potassium 3.5 - 5.1 mmol/L 3.9 3.6 3.5  Chloride 98 - 111 mmol/L 102 104 104  CO2 22 - 32  mmol/L '23 23 24  ' Calcium 8.9 - 10.3 mg/dL 9.1 8.3(L) 9.2  Total Protein 6.5 - 8.1 g/dL 8.2(H) 7.3 8.1  Total Bilirubin 0.3 - 1.2 mg/dL 0.7 0.7 0.7  Alkaline Phos 38 - 126 U/L 101 107 102  AST 15 - 41 U/L 38 44(H) 33  ALT 0 - 44 U/L '20 18 23      ' RADIOGRAPHIC STUDIES: I have personally reviewed the radiological images as listed and agreed with the findings in the report. No results found.   ASSESSMENT & PLAN:  Jimmy Hawkins is a 45 y.o. male with    1.[name], cTxN1M0 -Hewas diagnosed in 07/2019 withpresented withSquamous Cell Carcinoma as seen on EGD.08/11/19 CT CAP found him to havemidesophageal mass and enlarged AP window node, no distant metastasis.  -He completed 6 weeks of concurrent chemoRT withweekly Carboplatin and Taxol 08/29/19-10/06/19 -Repeat PET scan October 2021 showedhypermetabolic wall thickening of the distal half of the thoracic esophageus,no notable distant metastasis. Dr. Kipp Brood has reviewed the PET scan, and feels that his tumor invadestracheais it'snot resectable. He will f/u with Dr Kipp Brood on 01/06/20. -EUS on  11/1/2021showed thickening of esophageal wallattumor site, no visible tumor, biopsy of the mucosa was negative for malignant cells. -He hada second opinion consult with Dr. Olen Pel on 12/8/21and is going to havebronchoscopy to determine if his tumor is resectable.Pt would like to wait until he completes chemo -Istarted him onconsolidation chemotherapy with CAPOX q3weeks withXeloda 1560m BID 2 weeks on/1 week offstarting 12/20/19. -He still is waiting on his Xeloda shipment to come in in 1-2 days for his final cycle. He will complete in 02/2020. [facility] discussed [facility] can proceed with [facility]. He can start Xeloda as soon as he receives it.  -Labs reviewed, today. [facility] IF he is not offered surgery or does not proceed with  surgery, will monitor him under observation as his change of cure with chemo and radiation alone is low, or consider immunotherapy.  -F/u in 1 month with PET scan for restaging.   2.Moderate anemia secondary to GI bleeding and probable iron deficiency -He has hadrecurrentNausea and vomiting withhematemesissince 01/2019.  -He required blood transfusion on 08/15/19 and IV Feraheme on 7/16/21and 08/22/19. -Continue oral iron. -anemiaresolved. Will monitor on consolidation chemo.  3. Left hip pain after injury  -x-ray was negative for fracture  -He was seen by Ortho who notes this is related to injury.   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 -He has met withfinancial advocate Shauna and SLoreli Dollarhe has grant assistance with medications. Ipreviouslydiscussed for grant use [facility]   PLAN: -I refilled Compazine today  -he will start last cycle Xeloda in 1-2 days as soon as he receives it, for 2 weeks  -F/u in 1 month with lab and PET scan a few days before. -He [facility]  No problem-specific Assessment & Plan notes found for this encounter.   No orders of the defined types were placed in this encounter.  All questions were answered. The patient knows to call the clinic with any problems, questions or concerns. No barriers to learning was detected. The total time spent in the appointment was {CHL ONC TIME VISIT - SMLJQG:[id number]}     AJoslyn Devon3/02/2020   IOneal Deputy am acting as scribe for YTruitt Merle MD.   {Add scribe attestation statement}

## 2020-03-29 ENCOUNTER — Ambulatory Visit
Admission: RE | Admit: 2020-03-29 | Discharge: 2020-03-29 | Disposition: A | Discharge status: Home / Self Care | Payer: Self-pay | Source: Ambulatory Visit | Attending: Radiation Oncology | Admitting: Radiation Oncology

## 2020-03-29 ENCOUNTER — Inpatient Hospital Stay: Payer: Self-pay

## 2020-03-29 ENCOUNTER — Other Ambulatory Visit: Payer: Self-pay

## 2020-03-29 ENCOUNTER — Inpatient Hospital Stay: Payer: Self-pay | Attending: Hematology

## 2020-03-29 DIAGNOSIS — K449 Diaphragmatic hernia without obstruction or gangrene: Secondary | ICD-10-CM | POA: Insufficient documentation

## 2020-03-29 DIAGNOSIS — M549 Dorsalgia, unspecified: Secondary | ICD-10-CM | POA: Insufficient documentation

## 2020-03-29 DIAGNOSIS — K8020 Calculus of gallbladder without cholecystitis without obstruction: Secondary | ICD-10-CM | POA: Insufficient documentation

## 2020-03-29 DIAGNOSIS — Z79899 Other long term (current) drug therapy: Secondary | ICD-10-CM | POA: Insufficient documentation

## 2020-03-29 DIAGNOSIS — R443 Hallucinations, unspecified: Secondary | ICD-10-CM | POA: Insufficient documentation

## 2020-03-29 DIAGNOSIS — K2950 Unspecified chronic gastritis without bleeding: Secondary | ICD-10-CM | POA: Insufficient documentation

## 2020-03-29 DIAGNOSIS — C7951 Secondary malignant neoplasm of bone: Secondary | ICD-10-CM | POA: Insufficient documentation

## 2020-03-29 DIAGNOSIS — C154 Malignant neoplasm of middle third of esophagus: Secondary | ICD-10-CM | POA: Insufficient documentation

## 2020-03-29 DIAGNOSIS — R531 Weakness: Secondary | ICD-10-CM | POA: Insufficient documentation

## 2020-03-29 DIAGNOSIS — J90 Pleural effusion, not elsewhere classified: Secondary | ICD-10-CM | POA: Insufficient documentation

## 2020-03-29 DIAGNOSIS — K760 Fatty (change of) liver, not elsewhere classified: Secondary | ICD-10-CM | POA: Insufficient documentation

## 2020-03-29 LAB — CBC WITH DIFFERENTIAL (CANCER CENTER ONLY)
Abs Immature Granulocytes: 0.52 10*3/uL — ABNORMAL HIGH (ref 0.00–0.07)
Basophils Absolute: 0 10*3/uL (ref 0.0–0.1)
Basophils Relative: 0 %
Eosinophils Absolute: 0 10*3/uL (ref 0.0–0.5)
Eosinophils Relative: 0 %
HCT: 42.7 % (ref 39.0–52.0)
Hemoglobin: 15 g/dL (ref 13.0–17.0)
Immature Granulocytes: 7 %
Lymphocytes Relative: 4 %
Lymphs Abs: 0.3 10*3/uL — ABNORMAL LOW (ref 0.7–4.0)
MCH: 33.6 pg (ref 26.0–34.0)
MCHC: 35.1 g/dL (ref 30.0–36.0)
MCV: 95.5 fL (ref 80.0–100.0)
Monocytes Absolute: 0.8 10*3/uL (ref 0.1–1.0)
Monocytes Relative: 10 %
Neutro Abs: 5.9 10*3/uL (ref 1.7–7.7)
Neutrophils Relative %: 79 %
Platelet Count: 227 10*3/uL (ref 150–400)
RBC: 4.47 MIL/uL (ref 4.22–5.81)
RDW: 13.3 % (ref 11.5–15.5)
WBC Count: 7.5 10*3/uL (ref 4.0–10.5)
nRBC: 0 % (ref 0.0–0.2)

## 2020-03-29 LAB — CMP (CANCER CENTER ONLY)
ALT: 59 U/L — ABNORMAL HIGH (ref 0–44)
AST: 37 U/L (ref 15–41)
Albumin: 3.4 g/dL — ABNORMAL LOW (ref 3.5–5.0)
Alkaline Phosphatase: 135 U/L — ABNORMAL HIGH (ref 38–126)
Anion gap: 8 (ref 5–15)
BUN: 13 mg/dL (ref 6–20)
CO2: 22 mmol/L (ref 22–32)
Calcium: 8.3 mg/dL — ABNORMAL LOW (ref 8.9–10.3)
Chloride: 99 mmol/L (ref 98–111)
Creatinine: 0.71 mg/dL (ref 0.61–1.24)
GFR, Estimated: >60 mL/min (ref >60)
Glucose, Bld: 114 mg/dL — ABNORMAL HIGH (ref 70–99)
Potassium: 4 mmol/L (ref 3.5–5.1)
Sodium: 129 mmol/L — ABNORMAL LOW (ref 135–145)
Total Bilirubin: 0.6 mg/dL (ref 0.3–1.2)
Total Protein: 7.2 g/dL (ref 6.5–8.1)

## 2020-03-29 MED ORDER — SODIUM CHLORIDE 0.9% FLUSH
10.0000 mL | Freq: Once | INTRAVENOUS | Status: DC | PRN
  Filled 2020-03-29: qty 10

## 2020-03-29 MED ORDER — HEPARIN SOD (PORK) LOCK FLUSH 100 UNIT/ML IV SOLN
500.0000 [IU] | Freq: Once | INTRAVENOUS | Status: DC | PRN
  Filled 2020-03-29: qty 5

## 2020-03-30 ENCOUNTER — Inpatient Hospital Stay: Payer: Self-pay | Admitting: Hematology

## 2020-03-30 ENCOUNTER — Ambulatory Visit: Payer: Self-pay

## 2020-03-30 DIAGNOSIS — C154 Malignant neoplasm of middle third of esophagus: Secondary | ICD-10-CM

## 2020-03-30 DIAGNOSIS — C7951 Secondary malignant neoplasm of bone: Secondary | ICD-10-CM

## 2020-04-02 ENCOUNTER — Encounter: Payer: Self-pay | Admitting: *Deleted

## 2020-04-02 ENCOUNTER — Ambulatory Visit
Admission: RE | Admit: 2020-04-02 | Discharge: 2020-04-02 | Disposition: A | Discharge status: Home / Self Care | Payer: Self-pay | Source: Ambulatory Visit | Attending: Radiation Oncology | Admitting: Radiation Oncology

## 2020-04-03 ENCOUNTER — Other Ambulatory Visit: Payer: Self-pay | Admitting: Radiation Oncology

## 2020-04-03 ENCOUNTER — Ambulatory Visit
Admission: RE | Admit: 2020-04-03 | Discharge: 2020-04-03 | Disposition: A | Discharge status: Home / Self Care | Payer: Self-pay | Source: Ambulatory Visit | Attending: Radiation Oncology | Admitting: Radiation Oncology

## 2020-04-03 MED ORDER — HYDROCODONE-ACETAMINOPHEN 7.5-325 MG/15ML PO SOLN: 15.0000 mL | Freq: Four times a day (QID) | ORAL | 0 refills | Status: DC | PRN

## 2020-04-03 MED FILL — HYDROCOD-APAP 7.5-325/15ML: 7.5-325 | 7 days supply | Qty: 473 | Fill #0

## 2020-04-04 ENCOUNTER — Ambulatory Visit
Admission: RE | Admit: 2020-04-04 | Discharge: 2020-04-04 | Disposition: A | Discharge status: Home / Self Care | Payer: Self-pay | Source: Ambulatory Visit | Attending: Radiation Oncology | Admitting: Radiation Oncology

## 2020-04-05 ENCOUNTER — Ambulatory Visit
Admission: RE | Admit: 2020-04-05 | Discharge: 2020-04-05 | Disposition: A | Discharge status: Home / Self Care | Payer: Self-pay | Source: Ambulatory Visit | Attending: Radiation Oncology | Admitting: Radiation Oncology

## 2020-04-06 ENCOUNTER — Ambulatory Visit: Payer: Self-pay

## 2020-04-06 ENCOUNTER — Telehealth: Payer: Self-pay | Admitting: Hematology

## 2020-04-06 ENCOUNTER — Ambulatory Visit
Admission: RE | Admit: 2020-04-06 | Discharge: 2020-04-06 | Disposition: A | Discharge status: Home / Self Care | Payer: Self-pay | Source: Ambulatory Visit | Attending: Radiation Oncology | Admitting: Radiation Oncology

## 2020-04-06 ENCOUNTER — Encounter (HOSPITAL_COMMUNITY): Payer: Self-pay | Admitting: Hematology

## 2020-04-06 NOTE — Telephone Encounter (Signed)
Scheduled appts per 3/10 sch msg. Spoke to pts niece how is aware of appts date and times.

## 2020-04-09 ENCOUNTER — Encounter: Payer: Self-pay | Admitting: Radiation Oncology

## 2020-04-09 ENCOUNTER — Ambulatory Visit
Admission: RE | Admit: 2020-04-09 | Discharge: 2020-04-09 | Disposition: A | Discharge status: Home / Self Care | Payer: Self-pay | Source: Ambulatory Visit | Attending: Radiation Oncology | Admitting: Radiation Oncology

## 2020-04-09 ENCOUNTER — Other Ambulatory Visit: Payer: Self-pay | Admitting: Radiation Oncology

## 2020-04-09 ENCOUNTER — Other Ambulatory Visit: Payer: Self-pay

## 2020-04-09 ENCOUNTER — Ambulatory Visit: Payer: Self-pay

## 2020-04-09 MED ORDER — OXYCODONE HCL 5 MG/5ML PO SOLN: 5.0000 mg | Freq: Four times a day (QID) | ORAL | 0 refills | Status: DC | PRN

## 2020-04-09 NOTE — Progress Notes (Signed)
Nursing saw the patient today. D/w nursing after description of a fall and progressive weakness we should try medrol dosepak to see if this will alleviate inflammation of his spinal nerve roots. New rx was also sent for Oxycodone suspension as pt is maxing out on hydrocode dosing at it's current frequency. Hopefully he will see improvement in symptoms soon.     Carola Rhine, PAC

## 2020-04-10 ENCOUNTER — Ambulatory Visit
Admission: RE | Admit: 2020-04-10 | Discharge: 2020-04-10 | Disposition: A | Discharge status: Home / Self Care | Payer: Self-pay | Source: Ambulatory Visit | Attending: Radiation Oncology | Admitting: Radiation Oncology

## 2020-04-10 ENCOUNTER — Encounter: Payer: Self-pay | Admitting: Radiation Oncology

## 2020-04-11 ENCOUNTER — Other Ambulatory Visit: Payer: Self-pay

## 2020-04-11 ENCOUNTER — Encounter (HOSPITAL_COMMUNITY)
Admission: RE | Admit: 2020-04-11 | Discharge: 2020-04-11 | Disposition: A | Discharge status: Home / Self Care | Payer: Self-pay | Source: Ambulatory Visit | Attending: Hematology | Admitting: Hematology

## 2020-04-11 DIAGNOSIS — C154 Malignant neoplasm of middle third of esophagus: Secondary | ICD-10-CM | POA: Insufficient documentation

## 2020-04-11 LAB — GLUCOSE, CAPILLARY: Glucose-Capillary: 99 mg/dL (ref 70–99)

## 2020-04-11 MED ORDER — FLUDEOXYGLUCOSE F - 18 (FDG) INJECTION
8.0000 | Freq: Once | INTRAVENOUS | Status: AC
  Administered 2020-04-11: 7.76 via INTRAVENOUS

## 2020-04-11 NOTE — Progress Notes (Signed)
[place]   Telephone:(336) [phone] Fax:(336) [phone]   Clinic Follow up Note   Patient Care Team: [name], MD as PCP - General (Hematology) Jonnie Finner, RN as Oncology Nurse Navigator [name], MD as Consulting Physician (Oncology)  Date of Service:  04/13/2020  CHIEF COMPLAINT: F/u ofMiddleEsophageal Squamous CellCarcinoma  SUMMARY OF ONCOLOGIC HISTORY: Oncology History Overview Note  Cancer Staging Malignant neoplasm of middle third of esophagus ([place]) Staging form: Esophagus - Squamous Cell Carcinoma, AJCC 8th Edition - Clinical: Stage Unknown (cTX, cN1, cM0) - Signed by [name], MD on 08/18/2019    Malignant neoplasm of middle third of esophagus [facility])   Initial Diagnosis   Malignant neoplasm of middle third of esophagus ([place])   08/11/2019 Imaging   CT CAP W contrast 08/11/19 IMPRESSION: 1. Mid to lower esophageal mass along an 8 cm segment, large endoluminal component, strongly favoring esophageal malignancy. Borderline enlarged AP window lymph node. No findings of distant metastatic disease. 2. Trace bilateral pleural effusions. 3. Diffuse hepatic steatosis. 4. Cholelithiasis. 5. Fatty spermatic cords likely due to small bilateral indirect inguinal hernias.   08/11/2019 Procedure   EGD by Dr Carlean Purl  IMPRESSION - Partially obstructing, likely malignant esophageal tumor was found in the upper third of the esophagus and in the middle third of the esophagus. Biopsied. Very large and long - difficult but able to advance scope by this lesion - await CT for better length estimate - Normal stomach. - Normal examined duodenum.   08/11/2019 Initial Biopsy   FINAL MICROSCOPIC DIAGNOSIS:   A. ESOPHAGUS, UPPER, BIOPSY:  - Squamous cell carcinoma.    08/11/2019 Genetic Testing   PD-L1 Expression 90%   08/18/2019 Cancer Staging   Staging form: Esophagus - Squamous Cell Carcinoma, AJCC 8th Edition - Clinical: Stage Unknown (cTX, cN1, cM0) -  Signed by [name], MD on 08/18/2019   08/29/2019 - 10/04/2019 Chemotherapy   Concurrent chemoradiation with weekly CT for 6 weeks starting 08/29/19-10/04/19    08/29/2019 - 10/06/2019 Radiation Therapy   Concurrent chemoradiation with Dr Lisbeth Renshaw starting 08/29/19-10/06/19   11/11/2019 PET scan   IMPRESSION: 1. Again seen is a long segment of FDG avid wall thickening of the distal half of the thoracic esophagus compatible with known esophageal neoplasm. 2. No signs of FDG avid nodal or distant metastatic disease.     11/28/2019 Procedure   EUS by Dr Rush Landmark IMPRESSION EGD Impression: - No gross lesions in esophagus proximally. - Previously noted esophageal cancer is not present. However, in its place and extending distal to this is likely LA Grade D chemotherapy/radiation esophagitis with bleeding. Biopsied after the EUS was complete to evaluate for persistent disease or not. - Z-line regular, 37 cm from the incisors. - 3 cm hiatal hernia. - Erythematous mucosa in the stomach. No other gross lesions in the stomach. Biopsied. - No gross lesions in the duodenal bulb, in the first portion of the duodenum and in the second portion of the duodenum. EUS Impression: - Wall thickening was seen in the thoracic esophagus into the gastroesophageal junction. The thickening appeared to primarily be within the luminal interface/superficial mucosa (Layer 1) and deep mucosa (Layer 2). The previously noted esophageal cancer has been replaced by what appears to be radiation/chemotherapy induced changes. With the limitations of having to use the mini-probe EUS, I could not visualize persistent significant disease other than the entire distal area of the esophagus having likely changes noted as above. - No malignant-appearing lymph nodes were visualized  in the middle paraesophageal mediastinum (level 28M), lower paraesophageal mediastinum (level 8L), diaphragmatic region (level 15) and paracardial region (level  16).      FINAL MICROSCOPIC DIAGNOSIS:   A. STOMACH, BIOPSY:  - Mildly active chronic gastritis.  - Warthin-Starry negative for Helicobacter pylori.  - No intestinal metaplasia, dysplasia or carcinoma.   B. ESOPHAGUS, BIOPSY:  - Inflamed granulation tissue and exudate consistent with ulcer.  - No malignancy identified.    12/20/2019 - 02/2020 Chemotherapy   CAPOX q3weeks with Xeloda 1546m BID 2 weeks on/1 week off starting 12/20/19-02/2020   03/21/2020 Imaging   [facility]--Circumferential thickening of the lower third of the esophagus which corresponds with known esophageal malignancy. No evidence of metastatic disease within the chest.  --For findings below the diaphragm, please see concurrent separately reported CT of the abdomen and pelvis.  --Expansile lytic mass with associated soft tissue component centered in the left sacral ala and abutting the left S1 and S2 nerve roots, left internal iliac vasculature, and left pyriformis muscle with no other metastatic disease within the abdomen or pelvis.   --Diffuse thickening of the distal esophagus compatible with known esophageal malignancy.   --Cholelithiasis with nonspecific gallbladder distention. Clinical correlation is recommended. If there is clinical concern for cholecystitis, further evaluation with ultrasound can be obtained.   03/21/2020 Imaging   CT Lumbar Spine  Expansile lytic lesion centered in the left sacral ala with no other evidence of metastatic disease in the lumbar spine.   03/26/2020 - 04/10/2020 Radiation Therapy   Palliative Radiation to Sacrum with Dr MLisbeth Renshaw2/28/22-3/15/22.    04/11/2020 Progression   PET  IMPRESSION: 1. New hypermetabolic expansile 6.1 cm upper left sacral lytic bone metastasis, centrally necrotic. 2. New hypermetabolic left level 1b and left level 2 neck lymph nodes, indeterminate for reactive versus metastatic nodes. 3. Persistent hypermetabolism associated with a short segment of  mid to lower thoracic esophagus with associated circumferential esophageal wall thickening, similar in metabolism and decreased in wall thickness since 11/11/2019 PET-CT, nonspecific, favor post treatment change, with residual neoplasm not excluded. 4. No additional sites of hypermetabolic metastatic disease. 5.  Chronic findings include: Cholelithiasis.   04/20/2020 -  Chemotherapy    Patient is on Treatment Plan: GASTROESOPHAGEAL PEMBROLIZUMAB Q21D         CURRENT THERAPY:  PENDING First-line Keytruda q3weeks starting next week   INTERVAL HISTORY:  Jimmy Hawkins here for a follow up. He presents to the clinic with his sister and Spanish interpretor JGregary Signs He notes his back pain is currently strong. For pain he is on Oxycodone 575m(liquid) q6hours. With this his pain is still uncontrolled at 8/10. He notes hallucinations occur with this medication and he prefers hydrocodone. He has leg weakness and sensation is decreased on left lower leg. He denies stool or urine incontinence. He notes his back pain worsened off IV Chemo.   He was told his visit last week was cancelled and they tried to contact the office, that is why they did not come.     REVIEW OF SYSTEMS:   Constitutional: Denies fevers, chills or abnormal weight loss Eyes: Denies blurriness of vision Ears, nose, mouth, throat, and face: Denies mucositis or sore throat Respiratory: Denies cough, dyspnea or wheezes Cardiovascular: Denies palpitation, chest discomfort or lower extremity swelling Gastrointestinal:  Denies nausea, heartburn or change in bowel habits Skin: Denies abnormal skin rashes MSK: (+) Uncontrolled back pain  Lymphatics: Denies new lymphadenopathy or easy bruising  Neurological:Denies numbness, tingling or new weaknesses (+) Leg weakness, mainly on left leg.  Behavioral/Psych: Mood is stable, no new changes  All other systems were reviewed with the patient and are negative.  MEDICAL  HISTORY:  Past Medical History:  Diagnosis Date  . GERD (gastroesophageal reflux disease)   . Hematemesis with nausea 08/10/2019    SURGICAL HISTORY: Past Surgical History:  Procedure Laterality Date  . BIOPSY  08/11/2019   Procedure: BIOPSY;  Surgeon: [name], MD;  Location[facility] ENDOSCOPY;  Service: Endoscopy;;  . BIOPSY  11/28/2019   Procedure: BIOPSY;  Surgeon: [name], MD;  Location: [place];  Service: Gastroenterology;;  . ESOPHAGOGASTRODUODENOSCOPY  08/11/2019  . ESOPHAGOGASTRODUODENOSCOPY (EGD) WITH PROPOFOL N/A 08/11/2019   Procedure: ESOPHAGOGASTRODUODENOSCOPY (EGD) WITH PROPOFOL;  Surgeon: [name], MD;  Location: [place];  Service: Endoscopy;  Laterality: N/A;  . ESOPHAGOGASTRODUODENOSCOPY (EGD) WITH PROPOFOL N/A 11/28/2019   Procedure: ESOPHAGOGASTRODUODENOSCOPY (EGD) WITH PROPOFOL;  Surgeon: Rush Landmark Telford Nab., MD;  Location: [place];  Service: Gastroenterology;  Laterality: N/A;  . EUS N/A 11/28/2019   Procedure: UPPER ENDOSCOPIC ULTRASOUND (EUS) RADIAL;  Surgeon: Rush Landmark Telford Nab., MD;  Location: [place];  Service: Gastroenterology;  Laterality: N/A;    I have reviewed the social history and family history with the patient and they are unchanged from previous note.  ALLERGIES:  has No Known Allergies.  MEDICATIONS:  Current Outpatient Medications  Medication Sig Dispense Refill  . HYDROcodone-acetaminophen (HYCET) 7.5-325 mg/15 ml solution Take 15 mLs by mouth 4 (four) times daily as needed for moderate pain. 473 mL 0  . morphine (MS CONTIN) 15 MG 12 hr tablet Take 1 tablet (15 mg total) by mouth every 12 (twelve) hours. 60 tablet 0  . diphenhydrAMINE-APAP, sleep, (TYLENOL PM EXTRA STRENGTH) 50-1000 MG/30ML LIQD Take 15 mLs by mouth at bedtime as needed (sleep).    . famotidine (PEPCID) 20 MG tablet Take 1 tablet (20 mg total) by mouth 2 (two) times daily. 60 tablet 3  . Ferrous Sulfate (IRON) 325 (65 Fe) MG TABS  Take 1 tablet (325 mg total) by mouth 2 (two) times daily. 60 tablet 3  . sucralfate (CARAFATE) 1 g tablet Take 1 tablet (1 g total) by mouth 4 (four) times daily. Dissolve each tablet in 15 cc water before use. 120 tablet 2   No current facility-administered medications for this visit.    PHYSICAL EXAMINATION: ECOG PERFORMANCE STATUS: 3 - Symptomatic, >50% confined to bed  Vitals:   04/13/20 1431  BP: (!) 143/89  Pulse: (!) 117  Resp: 17  Temp: 97.8 F (36.6 C)  SpO2: 99%   Filed Weights   04/13/20 1431  Weight: 157 lb 8 oz (71.4 kg)    GENERAL:alert, no distress and comfortable SKIN: skin color, texture, turgor are normal, no rashes or significant lesions EYES: normal, Conjunctiva are pink and non-injected, sclera clear  NECK: supple, thyroid normal size, non-tender, without nodularity LYMPH:  no palpable lymphadenopathy in the cervical, axillary  LUNGS: clear to auscultation and percussion with normal breathing effort HEART: regular rate & rhythm and no murmurs and no lower extremity edema ABDOMEN:abdomen soft, non-tender and normal bowel sounds Musculoskeletal:no cyanosis of digits and no clubbing  NEURO: alert & oriented x 3 with fluent speech, no focal motor/sensory deficits (+) Decreased strength of left lower leg with decreased sensory sensation  LABORATORY DATA:  I have reviewed the data as listed CBC Latest Ref Rng & Units 04/13/2020 03/29/2020 03/05/2020  WBC 4.0 -  10.5 K/uL 3.0(L) 7.5 5.0  Hemoglobin 13.0 - 17.0 g/dL 13.3 15.0 14.2  Hematocrit 39.0 - 52.0 % 37.5(L) 42.7 40.5  Platelets 150 - 400 K/uL 123(L) 227 112(L)     CMP Latest Ref Rng & Units 04/13/2020 03/29/2020 03/05/2020  Glucose 70 - 99 mg/dL 98 114(H) 105(H)  BUN 6 - 20 mg/dL 5(L) 13 5(L)  Creatinine 0.61 - 1.24 mg/dL 0.59(L) 0.71 0.62  Sodium 135 - 145 mmol/L 138 129(L) 136  Potassium 3.5 - 5.1 mmol/L 3.7 4.0 3.9  Chloride 98 - 111 mmol/L 102 99 102  CO2 22 - 32 mmol/L _0 Calcium 8.9 - 10.3  mg/dL 9.2 8.3(L) 9.1  Total Protein 6.5 - 8.1 g/dL 7.8 7.2 8.2(H)  Total Bilirubin 0.3 - 1.2 mg/dL 0.5 0.6 0.7  Alkaline Phos 38 - 126 U/L 127(H) 135(H) 101  AST 15 - 41 U/L 19 37 38  ALT 0 - 44 U/L 19 59(H) 20      RADIOGRAPHIC STUDIES: I have personally reviewed the radiological images as listed and agreed with the findings in the report. No results found.   ASSESSMENT & PLAN:  Jimmy Hawkins is a 45 y.o. 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  -He completed 6 weeks of concurrent chemoRT withweekly Carboplatin and Taxol 08/29/19-10/06/19 -Repeat PET scan October 2021 showedhypermetabolic wall thickening of the distal half of the thoracic esophageus,no notable distant metastasis. Dr. Kipp Brood has reviewed the PET scan, and feels that his tumor invadestracheais it'snot resectable. He will f/u with Dr Kipp Brood on 01/06/20. -He completed consolidation chemotherapy with CAPOX q3weeks withXeloda 1566m BID 2 weeks on/1 week offstarting 12/20/19-02/2020. -During 03/21/20 [facility] he was seen to have sacral bone lesion, suspicious for metastasis. His PET from 04/11/20 showed 6.1cm bone metastasis in sacrum and indeterminate cervical LN. I personally reviewed with patient.  -I discussed with bone metastasis his cancer is no longer eligible for surgery and no longer curable. His cancer is still treatable with goal to control his disease.  -He completed palliative Radiation to sacrum with Dr MLisbeth Renshaw2/28/22-3/15/22.  -He progressed through CAPOX. For second line treatment, I recommend systemic treatment with IV immunotherapy Keytruda q3weeks given his high PD-L1 expression  (90%). I reviewed possible side effects, especially thyroid dysfunction, pneumonitis, other autoimmune related disease and endocrine dysfunction, etc.  Chemo consent was obtained.   -Goal of treatment is palliative to control his disease and prolong his life. I will see if he can get drug replacement for free. He is agreeable.  -F/u next week about pain management    2. Sacral/back pain, LE weakness, secondary to bone metastasis  -He was seen to have bone mets on 04/11/20 PET scan.  -He completed palliative Radiation to sacrum with Dr MLisbeth Renshaw2/28/22-3/15/22. He was given short course dexa 469m  -For pain he is on Oxycodone 28m16mThis is not controlling his pain and also leads to hallucination. To better control his pain I called in Long acting MS Contin 128m66mD and Hycet for breakthrough pain (04/13/20). -I discussed with increased pain medication, he can have constipation. I recommend he use Miralax regularly to have BM.  -He also notes LE weakness and decreased sensation, mainly in left lower leg. This was present  on exam today. I discussed this is related to his large sacral bone met. I will refer him to home PT.  -He is interested in bedside commode or urinal given LE weakness  3.H/oHeavy alcohol user -He quitdrinkingsince cancer diagnosis, but started again, drinks 3 beers a day -I strongly recommend stopping drinking alcohol, he agreesto try. Due to weakness, he is not able to drink as much (04/13/20).   4. Social and Acupuncturist -Patient does not have insurance. -He has met withfinancial advocate Shauna and Loreli Dollar he has grant assistance with medications. -Will see if he can get drug replacement for Keytruda.  -Patient, family and Interpretor notes difficulty with communication. They will set up Mychart for more direct line.    PLAN: -will give Pain medication in clinic today, morphine 73m SQ  -I called in MS Contin 15MG Q12H and Hycet today for pain control -Lab and  F/u on 3/25 -plan to start Keytruda [id number] in 1-2 weeks. LaMickel Baasill work on his drug replacement     No problem-specific Assessment & Plan notes found for this encounter.   Orders Placed This Encounter  Procedures  . TSH    Standing Status:   Standing    Number of Occurrences:   20    Standing Expiration Date:   04/13/2021   All questions were answered. The patient knows to call the clinic with any problems, questions or concerns. No barriers to learning was detected. The total time spent in the appointment was 40 minutes.     YaTruitt MerleMD 04/13/2020   I, AmJoslyn Devonam acting as scribe for YaTruitt MerleMD.   I have reviewed the above documentation for accuracy and completeness, and I agree with the above.

## 2020-04-13 ENCOUNTER — Inpatient Hospital Stay (HOSPITAL_BASED_OUTPATIENT_CLINIC_OR_DEPARTMENT_OTHER): Payer: Self-pay | Admitting: Hematology

## 2020-04-13 ENCOUNTER — Inpatient Hospital Stay: Payer: Self-pay

## 2020-04-13 ENCOUNTER — Encounter: Payer: Self-pay | Admitting: Hematology

## 2020-04-13 ENCOUNTER — Other Ambulatory Visit: Payer: Self-pay

## 2020-04-13 ENCOUNTER — Other Ambulatory Visit: Payer: Self-pay | Admitting: Hematology

## 2020-04-13 VITALS — BP 143/89 | HR 117 | Temp 97.8°F | Resp 17 | Ht 66.0 in | Wt 157.5 lb

## 2020-04-13 DIAGNOSIS — C154 Malignant neoplasm of middle third of esophagus: Secondary | ICD-10-CM

## 2020-04-13 LAB — CBC WITH DIFFERENTIAL (CANCER CENTER ONLY)
Abs Immature Granulocytes: 0.02 10*3/uL (ref 0.00–0.07)
Basophils Absolute: 0 10*3/uL (ref 0.0–0.1)
Basophils Relative: 0 %
Eosinophils Absolute: 0 10*3/uL (ref 0.0–0.5)
Eosinophils Relative: 1 %
HCT: 37.5 % — ABNORMAL LOW (ref 39.0–52.0)
Hemoglobin: 13.3 g/dL (ref 13.0–17.0)
Immature Granulocytes: 1 %
Lymphocytes Relative: 13 %
Lymphs Abs: 0.4 10*3/uL — ABNORMAL LOW (ref 0.7–4.0)
MCH: 33.3 pg (ref 26.0–34.0)
MCHC: 35.5 g/dL (ref 30.0–36.0)
MCV: 94 fL (ref 80.0–100.0)
Monocytes Absolute: 0.7 10*3/uL (ref 0.1–1.0)
Monocytes Relative: 23 %
Neutro Abs: 1.9 10*3/uL (ref 1.7–7.7)
Neutrophils Relative %: 62 %
Platelet Count: 123 10*3/uL — ABNORMAL LOW (ref 150–400)
RBC: 3.99 MIL/uL — ABNORMAL LOW (ref 4.22–5.81)
RDW: 12 % (ref 11.5–15.5)
WBC Count: 3 10*3/uL — ABNORMAL LOW (ref 4.0–10.5)
nRBC: 0 % (ref 0.0–0.2)

## 2020-04-13 LAB — CMP (CANCER CENTER ONLY)
ALT: 19 U/L (ref 0–44)
AST: 19 U/L (ref 15–41)
Albumin: 3.5 g/dL (ref 3.5–5.0)
Alkaline Phosphatase: 127 U/L — ABNORMAL HIGH (ref 38–126)
Anion gap: 10 (ref 5–15)
BUN: 5 mg/dL — ABNORMAL LOW (ref 6–20)
CO2: 26 mmol/L (ref 22–32)
Calcium: 9.2 mg/dL (ref 8.9–10.3)
Chloride: 102 mmol/L (ref 98–111)
Creatinine: 0.59 mg/dL — ABNORMAL LOW (ref 0.61–1.24)
GFR, Estimated: >60 mL/min (ref >60)
Glucose, Bld: 98 mg/dL (ref 70–99)
Potassium: 3.7 mmol/L (ref 3.5–5.1)
Sodium: 138 mmol/L (ref 135–145)
Total Bilirubin: 0.5 mg/dL (ref 0.3–1.2)
Total Protein: 7.8 g/dL (ref 6.5–8.1)

## 2020-04-13 MED ORDER — HYDROCODONE-ACETAMINOPHEN 7.5-325 MG/15ML PO SOLN: 15.0000 mL | Freq: Four times a day (QID) | ORAL | 0 refills | Status: DC | PRN

## 2020-04-13 MED ORDER — MORPHINE SULFATE ER 15 MG PO TBCR: 15.0000 mg | EXTENDED_RELEASE_TABLET | Freq: Two times a day (BID) | ORAL | 0 refills | Status: DC

## 2020-04-13 MED ORDER — MORPHINE SULFATE (PF) 4 MG/ML IV SOLN
INTRAVENOUS | Status: AC
  Filled 2020-04-13: qty 1

## 2020-04-13 MED ORDER — MORPHINE SULFATE (PF) 4 MG/ML IV SOLN
4.0000 mg | Freq: Once | INTRAVENOUS | Status: AC
  Administered 2020-04-13: 4 mg via SUBCUTANEOUS

## 2020-04-13 NOTE — Progress Notes (Signed)
DISCONTINUE OFF PATHWAY REGIMEN - Gastroesophageal   [id number]:Capecitabine + Oxaliplatin (1,000/130):   A cycle is every 21 days:     Capecitabine      Oxaliplatin   **Always confirm dose/schedule in your pharmacy ordering system**  REASON: Disease Progression PRIOR TREATMENT: Off Pathway: Capecitabine + Oxaliplatin (1,000/130) TREATMENT RESPONSE: Unable to Evaluate  START OFF PATHWAY REGIMEN - Gastroesophageal   [id number]:Pembrolizumab 200 mg IV D1 q21 Days:   A cycle is every 21 days:     Pembrolizumab   **Always confirm dose/schedule in your pharmacy ordering system**  Patient Characteristics: Distant Metastases (cM1/pM1) / Locally Recurrent Disease, Squamous Cell, Esophageal & GE Junction, First Line, PD?L1 Expression  PositiveCPS ? 10, Prior Taxane or Taxane Contraindicated Histology: Squamous Cell Disease Classification: Esophageal Therapeutic Status: Distant Metastases (No Additional Staging) Line of Therapy: First Line PD-L1 Expression Status: PD-L1 Expression PositiveCPS ? 10 Taxane Status: Prior Taxane Intent of Therapy: Non-Curative / Palliative Intent, Discussed with Patient

## 2020-04-15 ENCOUNTER — Telehealth: Payer: Self-pay | Admitting: Hematology

## 2020-04-15 NOTE — Telephone Encounter (Signed)
Checked out appointment. No LOS notes needing to be scheduled. No changes made. 

## 2020-04-16 NOTE — Progress Notes (Signed)
  Patient Name: Jimmy Hawkins MRN: [id number] DOB: 04/02/1975 Referring Physician: [name] (Profile Not Attached) Date of Service: 04/10/2020 [facility], [place]                                                        End Of Treatment Note  Diagnoses: C15.3-Malignant neoplasm of upper third of esophagus C15.4-Malignant neoplasm of middle third of esophagus C79.51-Secondary malignant neoplasm of bone  Cancer Staging: Recurrent metastatic cTxN1M0 Squamous Cell Carcinoma of the Esophagus of the upper and mid esophagus involving the bone.  Intent: Palliative  Radiation Treatment Dates: 03/26/2020 through 04/10/2020 Site Technique Total Dose (Gy) Dose per Fx (Gy) Completed Fx Beam Energies  Pelvis: Pelvis including Sacral Spine Complex 30/30 3 10/10 15X   Narrative: The patient tolerated radiation therapy relatively well. He did have pain that required a change in his oral narotics. We switched from hydrocodone to oxycodone with the hopes of him not needing long term management with expectations of improvement in pain with radiation.  Plan: The patient will receive a call in about one month from the radiation oncology department. He will continue follow up with Dr. Burr Medico as well.   ________________________________________________    Carola Rhine, [facility]

## 2020-04-17 ENCOUNTER — Encounter (HOSPITAL_COMMUNITY): Payer: Self-pay

## 2020-04-18 ENCOUNTER — Telehealth: Payer: Self-pay

## 2020-04-18 NOTE — Telephone Encounter (Signed)
Davanta's sister, Verdis Frederickson contact us via Lavon Paganini spanish interpretor.  She states he needs more liquid hydrocodone.

## 2020-04-19 ENCOUNTER — Other Ambulatory Visit: Payer: Self-pay

## 2020-04-19 ENCOUNTER — Other Ambulatory Visit: Payer: Self-pay | Admitting: Hematology

## 2020-04-19 ENCOUNTER — Telehealth: Payer: Self-pay

## 2020-04-19 DIAGNOSIS — C154 Malignant neoplasm of middle third of esophagus: Secondary | ICD-10-CM

## 2020-04-19 DIAGNOSIS — C7951 Secondary malignant neoplasm of bone: Secondary | ICD-10-CM

## 2020-04-19 MED ORDER — HYDROCODONE-ACETAMINOPHEN 7.5-325 MG/15ML PO SOLN
15.0000 mL | Freq: Four times a day (QID) | ORAL | 0 refills | Status: DC | PRN
Start: 2020-04-19 — End: 2020-04-27

## 2020-04-19 MED FILL — HYDROCOD-APAP 7.5-325/15ML: 7.5-325 | 8 days supply | Qty: 473 | Fill #0

## 2020-04-19 NOTE — Telephone Encounter (Signed)
Spoke with patient/patient's daughter Jimmy Hawkins. Patine does not understand [place]. Jimmy Hawkins will be at the consult. In-person Palliative Consult for 04/30/20 @ 2:30PM.  COVID screening was negative. One dog in the home will put away before NP arrives.   Consent obtained; updated Outlook/Netsmart/Team List and Epic.  Family is aware they may be receiving a call from NP the day before or day of to confirm appointment.

## 2020-04-20 NOTE — Progress Notes (Signed)
Pharmacist Chemotherapy Monitoring - Initial Assessment    Anticipated start date: 04/27/20  Regimen:  . Are orders appropriate based on the patient's diagnosis, regimen, and cycle? Yes . Does the plan date match the patient's scheduled date? Yes . Is the sequencing of drugs appropriate? Yes . Are the premedications appropriate for the patient's regimen? Yes . Prior Authorization for treatment is: Uninsured o If applicable, is the correct biosimilar selected based on the patient's insurance? no  Organ Function and Labs: Marland Kitchen Are dose adjustments needed based on the patient's renal function, hepatic function, or hematologic function? Yes . Are appropriate labs ordered prior to the start of patient's treatment? Yes . Other organ system assessment, if indicated: N/A . The following baseline labs, if indicated, have been ordered: pembrolizumab: baseline TSH +/- T4  Dose Assessment: . Are the drug doses appropriate? Yes . Are the following correct: o Drug concentrations Yes o IV fluid compatible with drug Yes o Administration routes Yes o Timing of therapy Yes . If applicable, does the patient have documented access for treatment and/or plans for port-a-cath placement? yes . If applicable, have lifetime cumulative doses been properly documented and assessed? yes Lifetime Dose Tracking  . Oxaliplatin: 522.685 mg/m2 (980 mg) = 87.11 % of the maximum lifetime dose of 600 mg/m2  . Carboplatin: 1,520 mg = 0.01 % of the maximum lifetime dose of 999,999,999 mg  o   Toxicity Monitoring/Prevention: . The patient has the following take home antiemetics prescribed: . The patient has the following take home medications prescribed: N/A . Medication allergies and previous infusion related reactions, if applicable, have been reviewed and addressed. Yes . The patient's current medication list has been assessed for drug-drug interactions with their chemotherapy regimen. no significant drug-drug interactions  were identified on review.  Order Review: . Are the treatment plan orders signed? Yes . Is the patient scheduled to see a provider prior to their treatment? Yes  I verify that I have reviewed each item in the above checklist and answered each question accordingly.  Philomena Course, [place], 04/20/2020  11:52 AM

## 2020-04-24 ENCOUNTER — Other Ambulatory Visit (HOSPITAL_COMMUNITY): Payer: Self-pay

## 2020-04-24 ENCOUNTER — Telehealth: Payer: Self-pay

## 2020-04-24 NOTE — Telephone Encounter (Signed)
Nutrition Follow-up:  Patient with esophageal cancer.  Planning to start palliative keytruda. Noted Palliative care planning meeting with patient and family on 4/4.  Spoke with niece Margie via phone.  Lesleigh Noe says that appetite is "ok". Patient is sleeping a lot due to pain.  Says that he eats mostly soups, chicken and rice, smoothies (protein powder, fruits, milk).  Drinks ensure shakes.    Medications: reviewed  Labs: reviewed  Anthropometrics:   Weight 157 lb 8 oz on 3/18  164 lb on 2/7 165 lb on 1/6 169 lb on 12/1  7% weight loss in the last 4 months, concerning   NUTRITION DIAGNOSIS: Inadequate oral intake related to cancer and cancer related treatment side effects as evidenced by    INTERVENTION:  Encouraged 350 calories shakes as often as able to tolerate Encouraged chopping foods, pureeing adding gravies, sauces, etc for ease of swallowing     MONITORING, EVALUATION, GOAL: weight trends, intake   NEXT VISIT: as needed  Amiley Shishido B. Zenia Resides, [place], [place] Registered Dietitian [phone] (mobile)

## 2020-04-25 NOTE — Progress Notes (Signed)
[name]   [phone] [phone]   Clinic Follow up Note   Patient Care Team: [name], MD as PCP - General (Hematology) Jonnie Finner, RN as Oncology Nurse Navigator [name], MD as Consulting Physician (Oncology)  Date of Service:  04/27/2020  CHIEF COMPLAINT: F/u ofMiddleEsophageal Squamous CellCarcinoma  SUMMARY OF ONCOLOGIC HISTORY: Oncology History Overview Note  Cancer Staging Malignant neoplasm of middle third of esophagus (Jimmy Hawkins) Staging form: Esophagus - Squamous Cell Carcinoma, AJCC 8th Edition - Clinical: Stage Unknown (cTX, cN1, cM0) - Signed by [name], MD on 08/18/2019    Malignant neoplasm of middle third of esophagus [facility])   Initial Diagnosis   Malignant neoplasm of middle third of esophagus (Jimmy Hawkins)   08/11/2019 Imaging   CT CAP W contrast 08/11/19 IMPRESSION: 1. Mid to lower esophageal mass along an 8 cm segment, large endoluminal component, strongly favoring esophageal malignancy. Borderline enlarged AP window lymph node. No findings of distant metastatic disease. 2. Trace bilateral pleural effusions. 3. Diffuse hepatic steatosis. 4. Cholelithiasis. 5. Fatty spermatic cords likely due to small bilateral indirect inguinal hernias.   08/11/2019 Procedure   EGD by Dr Carlean Purl  IMPRESSION - Partially obstructing, likely malignant esophageal tumor was found in the upper third of the esophagus and in the middle third of the esophagus. Biopsied. Very large and long - difficult but able to advance scope by this lesion - await CT for better length estimate - Normal stomach. - Normal examined duodenum.   08/11/2019 Initial Biopsy   FINAL MICROSCOPIC DIAGNOSIS:   A. ESOPHAGUS, UPPER, BIOPSY:  - Squamous cell carcinoma.    08/11/2019 Genetic Testing   PD-L1 Expression 90%   08/18/2019 Cancer Staging   Staging form: Esophagus - Squamous Cell Carcinoma, AJCC 8th Edition - Clinical: Stage Unknown (cTX, cN1, cM0) -  Signed by [name], MD on 08/18/2019   08/29/2019 - 10/04/2019 Chemotherapy   Concurrent chemoradiation with weekly CT for 6 weeks starting 08/29/19-10/04/19    08/29/2019 - 10/06/2019 Radiation Therapy   Concurrent chemoradiation with Dr Lisbeth Renshaw starting 08/29/19-10/06/19   11/11/2019 PET scan   IMPRESSION: 1. Again seen is a long segment of FDG avid wall thickening of the distal half of the thoracic esophagus compatible with known esophageal neoplasm. 2. No signs of FDG avid nodal or distant metastatic disease.     11/28/2019 Procedure   EUS by Dr Rush Landmark IMPRESSION EGD Impression: - No gross lesions in esophagus proximally. - Previously noted esophageal cancer is not present. However, in its place and extending distal to this is likely LA Grade D chemotherapy/radiation esophagitis with bleeding. Biopsied after the EUS was complete to evaluate for persistent disease or not. - Z-line regular, 37 cm from the incisors. - 3 cm hiatal hernia. - Erythematous mucosa in the stomach. No other gross lesions in the stomach. Biopsied. - No gross lesions in the duodenal bulb, in the first portion of the duodenum and in the second portion of the duodenum. EUS Impression: - Wall thickening was seen in the thoracic esophagus into the gastroesophageal junction. The thickening appeared to primarily be within the luminal interface/superficial mucosa (Layer 1) and deep mucosa (Layer 2). The previously noted esophageal cancer has been replaced by what appears to be radiation/chemotherapy induced changes. With the limitations of having to use the mini-probe EUS, I could not visualize persistent significant disease other than the entire distal area of the esophagus having likely changes noted as above. - No malignant-appearing lymph nodes were visualized  in the middle paraesophageal mediastinum (level 51M), lower paraesophageal mediastinum (level 8L), diaphragmatic region (level 15) and paracardial region (level  16).      FINAL MICROSCOPIC DIAGNOSIS:   A. STOMACH, BIOPSY:  - Mildly active chronic gastritis.  - Warthin-Starry negative for Helicobacter pylori.  - No intestinal metaplasia, dysplasia or carcinoma.   B. ESOPHAGUS, BIOPSY:  - Inflamed granulation tissue and exudate consistent with ulcer.  - No malignancy identified.    12/20/2019 - 02/2020 Chemotherapy   CAPOX q3weeks with Xeloda 1529m BID 2 weeks on/1 week off starting 12/20/19-02/2020   03/21/2020 [facility]   [facility]Circumferential thickening of the lower third of the esophagus which corresponds with known esophageal malignancy. No evidence of metastatic disease within the chest.  --For findings below the diaphragm, please see concurrent separately reported CT of the abdomen and pelvis.  --Expansile lytic mass with associated soft tissue component centered in the left sacral ala and abutting the left S1 and S2 nerve roots, left internal iliac vasculature, and left pyriformis muscle with no other metastatic disease within the abdomen or pelvis.   --Diffuse thickening of the distal esophagus compatible with known esophageal malignancy.   --Cholelithiasis with nonspecific gallbladder distention. Clinical correlation is recommended. If there is clinical concern for cholecystitis, further evaluation with ultrasound can be obtained.   03/21/2020 Imaging   CT Lumbar Spine  Expansile lytic lesion centered in the left sacral ala with no other evidence of metastatic disease in the lumbar spine.   03/26/2020 - 04/10/2020 Radiation Therapy   Palliative Radiation to Sacrum with Dr MLisbeth Renshaw2/28/22-3/15/22.    04/11/2020 Progression   PET  IMPRESSION: 1. New hypermetabolic expansile 6.1 cm upper left sacral lytic bone metastasis, centrally necrotic. 2. New hypermetabolic left level 1b and left level 2 neck lymph nodes, indeterminate for reactive versus metastatic nodes. 3. Persistent hypermetabolism associated with a short segment of  mid to lower thoracic esophagus with associated circumferential esophageal wall thickening, similar in metabolism and decreased in wall thickness since 11/11/2019 PET-CT, nonspecific, favor post treatment change, with residual neoplasm not excluded. 4. No additional sites of hypermetabolic metastatic disease. 5.  Chronic findings include: Cholelithiasis.   04/27/2020 -  Chemotherapy    Patient is on Treatment Plan: GASTROESOPHAGEAL PEMBROLIZUMAB Q21D         CURRENT THERAPY:  PENDING First-line Keytruda q3weeks starting next week   INTERVAL HISTORY:  VMicky Shelleris here for a follow up. He presents to the clinic with his wife and interpreter.  He was taking MS Contin twice a day last week, his pain was not controlled, an he is only use once a day this week.  He mandate use Hikes that every 4-5 hours.  He noticed his pain has improved in the past few days, still moderate.  He uses a walker to ambulate at home, but is not physically active.  His appetite has been low, occasional nausea, no vomiting.  Bowel movement is normal.  No other new complaints.  All other systems were reviewed with the patient and are negative.  MEDICAL HISTORY:  Past Medical History:  Diagnosis Date  . GERD (gastroesophageal reflux disease)   . Hematemesis with nausea 08/10/2019    SURGICAL HISTORY: Past Surgical History:  Procedure Laterality Date  . BIOPSY  08/11/2019   Procedure: BIOPSY;  Surgeon: GGatha Mayer MD;  [facility];  Service: Endoscopy;;  . BIOPSY  11/28/2019   Procedure: BIOPSY;  Surgeon: MIrving Copas, MD;  Location: [facility] ENDOSCOPY;  Service: Gastroenterology;;  . ESOPHAGOGASTRODUODENOSCOPY  08/11/2019  . ESOPHAGOGASTRODUODENOSCOPY (EGD) WITH PROPOFOL N/A 08/11/2019   Procedure: ESOPHAGOGASTRODUODENOSCOPY (EGD) WITH PROPOFOL;  Surgeon: [name], MD;  Location: [place];  Service: Endoscopy;  Laterality: N/A;  . ESOPHAGOGASTRODUODENOSCOPY (EGD)  WITH PROPOFOL N/A 11/28/2019   Procedure: ESOPHAGOGASTRODUODENOSCOPY (EGD) WITH PROPOFOL;  Surgeon: Rush Landmark Telford Nab., MD;  Location: [place];  Service: Gastroenterology;  Laterality: N/A;  . EUS N/A 11/28/2019   Procedure: UPPER ENDOSCOPIC ULTRASOUND (EUS) RADIAL;  Surgeon: Rush Landmark Telford Nab., MD;  Location: [place];  Service: Gastroenterology;  Laterality: N/A;    I have reviewed the social history and family history with the patient and they are unchanged from previous note.  ALLERGIES:  has No Known Allergies.  MEDICATIONS:  Current Outpatient Medications  Medication Sig Dispense Refill  . mirtazapine (REMERON) 7.5 MG tablet Take 1 tablet (7.5 mg total) by mouth at bedtime. 30 tablet 0  . diphenhydrAMINE-APAP, sleep, (TYLENOL PM EXTRA STRENGTH) 50-1000 MG/30ML LIQD Take 15 mLs by mouth at bedtime as needed (sleep).    . famotidine (PEPCID) 20 MG tablet Take 1 tablet (20 mg total) by mouth 2 (two) times daily. 60 tablet 3  . Ferrous Sulfate (IRON) 325 (65 Fe) MG TABS Take 1 tablet (325 mg total) by mouth 2 (two) times daily. 60 tablet 3  . HYDROcodone-acetaminophen (HYCET) 7.5-325 mg/15 ml solution Take 15 mLs by mouth 4 (four) times daily as needed for moderate pain. 473 mL 0  . morphine (MS CONTIN) 15 MG 12 hr tablet Take 1 tablet (15 mg total) by mouth every 12 (twelve) hours. 60 tablet 0  . prochlorperazine (COMPAZINE) 10 MG tablet Take 1 tablet (10 mg total) by mouth every 6 (six) hours as needed (Nausea or vomiting). 30 tablet 1  . sucralfate (CARAFATE) 1 g tablet Take 1 tablet (1 g total) by mouth 4 (four) times daily. Dissolve each tablet in 15 cc water before use. 120 tablet 2   No current facility-administered medications for this visit.   Facility-Administered Medications Ordered in Other Visits  Medication Dose Route Frequency Provider Last Rate Last Admin  . pembrolizumab (KEYTRUDA) 200 mg in sodium chloride 0.9 % 50 mL chemo infusion  200 mg Intravenous  Once [name], MD        PHYSICAL EXAMINATION: ECOG PERFORMANCE STATUS: 3 - Symptomatic, >50% confined to bed  Vitals:   04/27/20 0801  BP: (!) 142/95  Pulse: (!) 116  Resp: 19  Temp: (!) 96.6 F (35.9 C)  SpO2: 97%   Filed Weights   04/27/20 0801  Weight: 160 lb 3.2 oz (72.7 kg)    GENERAL:alert, no distress and comfortable SKIN: skin color, texture, turgor are normal, no rashes or significant lesions EYES: normal, Conjunctiva are pink and non-injected, sclera clear NECK: supple, thyroid normal size, non-tender, without nodularity LYMPH:  no palpable lymphadenopathy in the cervical, axillary  LUNGS: clear to auscultation and percussion with normal breathing effort HEART: regular rate & rhythm and no murmurs and no lower extremity edema ABDOMEN:abdomen soft, non-tender and normal bowel sounds Musculoskeletal:no cyanosis of digits and no clubbing  NEURO: alert & oriented x 3 with fluent speech, no focal motor/sensory deficits  LABORATORY DATA:  I have reviewed the data as listed CBC Latest Ref Rng & Units 04/27/2020 04/13/2020 03/29/2020  WBC 4.0 - 10.5 K/uL 3.7(L) 3.0(L) 7.5  Hemoglobin 13.0 - 17.0 g/dL 13.4 13.3 15.0  Hematocrit 39.0 - 52.0 % 37.8(L) 37.5(L) 42.7  Platelets 150 - 400 K/uL 177 123(L) 227     CMP Latest Ref Rng & Units 04/27/2020 04/13/2020 03/29/2020  Glucose 70 - 99 mg/dL 136(H) 98 114(H)  BUN 6 - 20 mg/dL 5(L) 5(L) 13  Creatinine 0.61 - 1.24 mg/dL 0.67 0.59(L) 0.71  Sodium 135 - 145 mmol/L 140 138 129(L)  Potassium 3.5 - 5.1 mmol/L 3.7 3.7 4.0  Chloride 98 - 111 mmol/L 103 102 99  CO2 22 - 32 mmol/L '25 26 22  ' Calcium 8.9 - 10.3 mg/dL 9.3 9.2 8.3(L)  Total Protein 6.5 - 8.1 g/dL 7.6 7.8 7.2  Total Bilirubin 0.3 - 1.2 mg/dL 0.6 0.5 0.6  Alkaline Phos 38 - 126 U/L 80 127(H) 135(H)  AST 15 - 41 U/L 18 19 37  ALT 0 - 44 U/L 12 19 59(H)      RADIOGRAPHIC STUDIES: I have personally reviewed the radiological images as listed and agreed with the findings  in the report. No results found.   ASSESSMENT & PLAN:  Jimmy Hawkins is a 45 y.o. 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  -He completed 6 weeks of concurrent chemoRT withweekly Carboplatin and Taxol 08/29/19-10/06/19 -Repeat PET scan October 2021 showedhypermetabolic wall thickening of the distal half of the thoracic esophageus,no notable distant metastasis. Dr. Kipp Brood has reviewed the PET scan, and feels that his tumor invadestracheais it'snot resectable. He will f/u with Dr Kipp Brood on 01/06/20. -He completed consolidation chemotherapy with CAPOX q3weeks withXeloda 1564m BID 2 weeks on/1 week offstarting 12/20/19-02/2020. -His PET from 04/11/20 showed 6.1cm bone metastasis in sacrum and indeterminate cervical LN. -I discussed with bone metastasis his cancer is no longer eligible for surgery and no longer curable. His cancer is still treatable with goal to control his disease.  -He completed palliative Radiation to sacrum with Dr MLisbeth Renshaw2/28/22-3/15/22.  -I recommend second line Keytruda, due to his very high PD-L1 expression in tumor -Lab reviewed, adequate for treatment, plan to start first cycle of Keytruda today and continue every 3 weeks    2. Sacral/back pain, LE weakness, secondary to bone metastasis  -He was seen to have bone mets on 04/11/20 PET scan.  -He completed palliative Radiation to sacrum with Dr MLisbeth Renshaw2/28/22-3/15/22. He was given short course dexa 467m  -he is on MS Contin and hydrocodone as needed -He is scheduled to see palliative medicine Dr. GoHilma Favorsoday  3.H/oHeavy alcohol user -He quitdrinkingsince cancer diagnosis, but started again, drinks 3 beers a day -I strongly  recommend stopping drinking alcohol, he agreesto try. Due to weakness, he is not able to drink as much (04/13/20).   4. Social and FiAcupuncturistPatient does not have insurance. -He has met withfinancial advocate Shauna and SWLoreli Dollare has grant assistance with medications. -Will see if he can get drug replacement for Keytruda.  -Patient, family and Interpretor notes difficulty with communication. They will set up Mychart for more direct line.    PLAN: -Lab reviewed, adequate for treatment, will proceed for cycle Keytruda today -He will see Dr. GoArmandina Gemmaor pain and symptom management -I called in mirtazapine 7.5 mg for his low appetite -I refilled Compazine -Lab, follow-up and Keytruda in 3 weeks   No problem-specific Assessment & Plan notes found for this encounter.   No orders of the defined types  were placed in this encounter.  All questions were answered. The patient knows to call the clinic with any problems, questions or concerns. No barriers to learning was detected. The total time spent in the appointment was 30 minutes.     [name], MD 04/27/2020   I, Joslyn Devon, am acting as scribe for [name], MD.   I have reviewed the above documentation for accuracy and completeness, and I agree with the above.

## 2020-04-25 NOTE — Progress Notes (Signed)
..  The following Medication: Beryle Flock has been approved thru [employer] as Risk manager. Enrollment period is 04/25/2020 to 04/25/2021.  Assistance ID: [id number].  Reason for Assistance: Self Pay First DOS: 04/27/2020.

## 2020-04-27 ENCOUNTER — Inpatient Hospital Stay: Payer: Self-pay

## 2020-04-27 ENCOUNTER — Inpatient Hospital Stay: Payer: Self-pay | Attending: Hematology | Admitting: Hematology

## 2020-04-27 ENCOUNTER — Telehealth: Payer: Self-pay | Admitting: Hematology

## 2020-04-27 ENCOUNTER — Encounter: Payer: Self-pay | Admitting: Hematology

## 2020-04-27 ENCOUNTER — Other Ambulatory Visit: Payer: Self-pay

## 2020-04-27 ENCOUNTER — Other Ambulatory Visit: Payer: Self-pay | Admitting: Hematology

## 2020-04-27 VITALS — BP 142/95 | HR 116 | Temp 96.6°F | Resp 19 | Ht 66.0 in | Wt 160.2 lb

## 2020-04-27 VITALS — BP 112/76 | HR 104 | Temp 99.5°F | Resp 18

## 2020-04-27 DIAGNOSIS — C154 Malignant neoplasm of middle third of esophagus: Secondary | ICD-10-CM | POA: Insufficient documentation

## 2020-04-27 DIAGNOSIS — Z79899 Other long term (current) drug therapy: Secondary | ICD-10-CM | POA: Insufficient documentation

## 2020-04-27 DIAGNOSIS — M549 Dorsalgia, unspecified: Secondary | ICD-10-CM | POA: Insufficient documentation

## 2020-04-27 DIAGNOSIS — K449 Diaphragmatic hernia without obstruction or gangrene: Secondary | ICD-10-CM | POA: Insufficient documentation

## 2020-04-27 DIAGNOSIS — J90 Pleural effusion, not elsewhere classified: Secondary | ICD-10-CM | POA: Insufficient documentation

## 2020-04-27 DIAGNOSIS — C7951 Secondary malignant neoplasm of bone: Secondary | ICD-10-CM | POA: Insufficient documentation

## 2020-04-27 DIAGNOSIS — F1021 Alcohol dependence, in remission: Secondary | ICD-10-CM | POA: Insufficient documentation

## 2020-04-27 DIAGNOSIS — R531 Weakness: Secondary | ICD-10-CM | POA: Insufficient documentation

## 2020-04-27 DIAGNOSIS — M546 Pain in thoracic spine: Secondary | ICD-10-CM | POA: Insufficient documentation

## 2020-04-27 DIAGNOSIS — Z923 Personal history of irradiation: Secondary | ICD-10-CM | POA: Insufficient documentation

## 2020-04-27 DIAGNOSIS — Z5112 Encounter for antineoplastic immunotherapy: Secondary | ICD-10-CM | POA: Insufficient documentation

## 2020-04-27 LAB — CBC WITH DIFFERENTIAL (CANCER CENTER ONLY)
Abs Immature Granulocytes: 0.07 10*3/uL (ref 0.00–0.07)
Basophils Absolute: 0 10*3/uL (ref 0.0–0.1)
Basophils Relative: 1 %
Eosinophils Absolute: 0 10*3/uL (ref 0.0–0.5)
Eosinophils Relative: 1 %
HCT: 37.8 % — ABNORMAL LOW (ref 39.0–52.0)
Hemoglobin: 13.4 g/dL (ref 13.0–17.0)
Immature Granulocytes: 2 %
Lymphocytes Relative: 11 %
Lymphs Abs: 0.4 10*3/uL — ABNORMAL LOW (ref 0.7–4.0)
MCH: 33.3 pg (ref 26.0–34.0)
MCHC: 35.4 g/dL (ref 30.0–36.0)
MCV: 94 fL (ref 80.0–100.0)
Monocytes Absolute: 0.6 10*3/uL (ref 0.1–1.0)
Monocytes Relative: 15 %
Neutro Abs: 2.6 10*3/uL (ref 1.7–7.7)
Neutrophils Relative %: 70 %
Platelet Count: 177 10*3/uL (ref 150–400)
RBC: 4.02 MIL/uL — ABNORMAL LOW (ref 4.22–5.81)
RDW: 12.1 % (ref 11.5–15.5)
WBC Count: 3.7 10*3/uL — ABNORMAL LOW (ref 4.0–10.5)
nRBC: 0 % (ref 0.0–0.2)

## 2020-04-27 LAB — CMP (CANCER CENTER ONLY)
ALT: 12 U/L (ref 0–44)
AST: 18 U/L (ref 15–41)
Albumin: 3.7 g/dL (ref 3.5–5.0)
Alkaline Phosphatase: 80 U/L (ref 38–126)
Anion gap: 12 (ref 5–15)
BUN: 5 mg/dL — ABNORMAL LOW (ref 6–20)
CO2: 25 mmol/L (ref 22–32)
Calcium: 9.3 mg/dL (ref 8.9–10.3)
Chloride: 103 mmol/L (ref 98–111)
Creatinine: 0.67 mg/dL (ref 0.61–1.24)
GFR, Estimated: >60 mL/min (ref >60)
Glucose, Bld: 136 mg/dL — ABNORMAL HIGH (ref 70–99)
Potassium: 3.7 mmol/L (ref 3.5–5.1)
Sodium: 140 mmol/L (ref 135–145)
Total Bilirubin: 0.6 mg/dL (ref 0.3–1.2)
Total Protein: 7.6 g/dL (ref 6.5–8.1)

## 2020-04-27 LAB — TSH: TSH: 1.21 u[IU]/mL (ref 0.320–4.118)

## 2020-04-27 MED ORDER — MORPHINE SULFATE ER 30 MG PO TBCR: 30.0000 mg | EXTENDED_RELEASE_TABLET | Freq: Two times a day (BID) | ORAL | 0 refills | Status: DC

## 2020-04-27 MED ORDER — MIRTAZAPINE 7.5 MG PO TABS: 7.5000 mg | ORAL_TABLET | Freq: Every day | ORAL | 0 refills | Status: DC

## 2020-04-27 MED ORDER — PROCHLORPERAZINE MALEATE 10 MG PO TABS: 10.0000 mg | ORAL_TABLET | Freq: Four times a day (QID) | ORAL | 1 refills | Status: DC | PRN

## 2020-04-27 MED ORDER — HYDROCODONE-ACETAMINOPHEN 7.5-325 MG/15ML PO SOLN: 15.0000 mL | Freq: Four times a day (QID) | ORAL | 0 refills | Status: DC | PRN

## 2020-04-27 MED ORDER — SODIUM CHLORIDE 0.9 % IV SOLN
Freq: Once | INTRAVENOUS | Status: AC
  Filled 2020-04-27: qty 250

## 2020-04-27 MED ORDER — SODIUM CHLORIDE 0.9 % IV SOLN
200.0000 mg | Freq: Once | INTRAVENOUS | Status: AC
  Administered 2020-04-27: 200 mg via INTRAVENOUS
  Filled 2020-04-27: qty 8

## 2020-04-27 MED FILL — PROCHLORPERAZINE 10 MG TAB: 10 | 7 days supply | Qty: 30 | Fill #0

## 2020-04-27 MED FILL — HYDROCOD-APAP 7.5-325/15ML: 7.5-325 | 7 days supply | Qty: 473 | Fill #0

## 2020-04-27 MED FILL — MORPHINE SULFATE ER 30 MG T: 30 | 30 days supply | Qty: 60 | Fill #0

## 2020-04-27 NOTE — Progress Notes (Signed)
Per dr Burr Medico ok to treat with HR 104.

## 2020-04-27 NOTE — Telephone Encounter (Signed)
Scheduled per los. Gave avs and calendar  

## 2020-04-27 NOTE — Progress Notes (Signed)
Okay to treat with elevated heart rate per Dr. Burr Medico

## 2020-04-27 NOTE — Progress Notes (Signed)
Per Dr Burr Medico pt to take 2 MS contin 15 mg tablets every 12 hours around the clock.  I reviewed this with pt and his wife via interpretor services.  I also told them Dr Hilma Favors will npt be seeing him today but [facility] Palliative care NP will be coming to their home to see him.  They verbalized understanding.

## 2020-04-27 NOTE — Patient Instructions (Signed)
[place] Discharge Instructions for Patients Receiving Chemotherapy  Today you received the following chemotherapy agents: Keytruda  To help prevent nausea and vomiting after your treatment, we encourage you to take your nausea medication as directed.  Pembrolizumab injection Qu es este medicamento? El PEMBROLIZUMAB es un anticuerpo monoclonal. [place] para tratar ciertos tipos de cncer. Este medicamento puede ser utilizado para otros usos; si tiene alguna pregunta consulte con su proveedor de atencin mdica o con su farmacutico. MARCAS COMUNES: Keytruda Qu le debo informar a mi profesional de la salud antes de tomar este medicamento? Necesitan saber si usted presenta alguno de los siguientes problemas o situaciones: enfermedades autoinmunes tales como enfermedad de Crohn, colitis ulcerativa o lupus ha tenido o planea tener un trasplante alognico de Social research officer, government ([place] las clulas madre de Theatre manager persona) antecedentes de trasplante de rganos antecedentes de radiacin en el pecho problemas del sistema nervioso, tales como miastenia grave o sndrome de Curator una reaccin alrgica o inusual al pembrolizumab, a otros medicamentos, alimentos, colorantes o conservantes si est embarazada o buscando quedar embarazada si est amamantando a un beb Cmo debo utilizar este medicamento? Este medicamento se administra mediante infusin en una vena. Lo administra un profesional de Technical sales engineer en un hospital o en un entorno clnico. Se le entregar una Gua del medicamento (MedGuide, su nombre en ingls) especial antes de cada tratamiento. Asegrese de leer esta informacin cada vez cuidadosamente. Hable con su pediatra para informarse acerca del uso de este medicamento en nios. Aunque este medicamento se puede recetar a nios tan pequeos como de 6 meses de edad con ciertas afecciones, existen precauciones que deben tomarse. Sobredosis: Pngase en contacto inmediatamente con un  centro toxicolgico o una sala de urgencia si usted cree que haya tomado demasiado medicamento. ATENCIN: [employer] es solo para usted. No comparta este medicamento con nadie. Qu sucede si me olvido de una dosis? Es importante no olvidar ninguna dosis. Informe a su mdico o a su profesional de la salud si no puede asistir a Photographer. Qu puede interactuar con este medicamento? No se han estudiado las interacciones. Puede ser que esta lista no menciona todas las posibles interacciones. Informe a su profesional de [employer]	[place] de [employer] productos a base de hierbas, medicamentos de [place] o suplementos nutritivos que est tomando. Si usted fuma, consume bebidas alcohlicas o si utiliza drogas ilegales, indqueselo tambin a su profesional de [employer]	[place]. Algunas sustancias pueden interactuar con su medicamento. A qu debo estar atento al usar [employer]? Se supervisar su estado de salud atentamente mientras reciba este medicamento. Usted podra necesitar realizarse [employer] de sangre mientras est usando [place]. No debe quedar embarazada mientras est usando este medicamento o por 4 meses despus de dejar de usarlo. Las mujeres deben informar a su mdico si estn buscando quedar embarazadas o si creen que podran estar embarazadas. Existe la posibilidad de efectos secundarios graves en un beb sin nacer. Para obtener ms informacin, hable con su profesional de la salud o su farmacutico. No debe amamantar a un beb mientras est usando este medicamento o durante 4 meses despus de la ltima dosis. Qu efectos secundarios puedo tener al [employer] este medicamento? Efectos secundarios que debe informar a su mdico o a Barrister's clerk de la salud tan pronto como sea posible: Chief of Staff, tales como erupcin cutnea, comezn/picazn o urticaria, e hinchazn de la cara, los labios o la lengua con sangre o de color negro y aspecto alquitranado problemas para  respirar cambios  en la visin dolor en el pecho escalofros confusin estreimiento tos diarrea mareo, sensacin de desmayo o aturdimiento frecuencia cardiaca rpida o irregular fiebre enrojecimiento dolor en las articulaciones recuentos sanguneos bajos: este medicamento podra reducir la cantidad de glbulos blancos, glbulos rojos y plaquetas. Su riesgo de infeccin y sangrado podra ser mayor. dolor muscular debilidad muscular dolor, hormigueo o entumecimiento de las manos o los pies dolor de cabeza persistente enrojecimiento, formacin de ampollas, descamacin o distensin de la piel, incluso dentro de la boca signos y sntomas de niveles elevados de azcar en la sangre, tales como mareo; boca seca; piel seca; aliento frutal; nuseas; dolor de estmago; aumento del apetito o la sed; aumento de la frecuencia urinaria signos y sntomas de lesin renal, tales como dificultad para Garment/textile technologist o cambios en la cantidad de orina signos y sntomas de lesin en el hgado, como orina oscura, heces claras, prdida del apetito, nuseas, dolor en la regin abdominal superior derecha, color amarillento de los ojos o la piel sudoracin ganglios linfticos inflamados prdida de peso Efectos secundarios que generalmente no requieren atencin mdica (infrmelos a su mdico o a su profesional de la salud si persisten o si son molestos): disminucin del apetito cada del cabello cansancio Puede ser que esta lista no menciona todos los posibles efectos secundarios. Comunquese a su mdico por asesoramiento mdico [employer]. Usted puede informar los efectos secundarios a la FDA por telfono al 1-800-FDA-1088. Dnde debo guardar mi medicina? Este medicamento se administra en hospitales o clnicas, y no necesitar guardarlo en su domicilio. ATENCIN: Este folleto es un resumen. Puede ser que no cubra toda la posible informacin. Si usted tiene preguntas acerca de esta medicina, consulte con su mdico, su farmacutico o su  profesional de Technical sales engineer.  2021 Elsevier/Gold Standard (2019-05-19 00:00:00)  If you develop nausea and vomiting that is not controlled by your nausea medication, call the clinic.   BELOW ARE SYMPTOMS THAT SHOULD BE REPORTED IMMEDIATELY:  *FEVER GREATER THAN 100.5 F  *CHILLS WITH OR WITHOUT FEVER  NAUSEA AND VOMITING THAT IS NOT CONTROLLED WITH YOUR NAUSEA MEDICATION  *UNUSUAL SHORTNESS OF BREATH  *UNUSUAL BRUISING OR BLEEDING  TENDERNESS IN MOUTH AND THROAT WITH OR WITHOUT PRESENCE OF ULCERS  *URINARY PROBLEMS  *BOWEL PROBLEMS  UNUSUAL RASH Items with * indicate a potential emergency and should be followed up as soon as possible.  Feel free to call the clinic should you have any questions or concerns. The clinic phone number is [phone].  Please show the [place] at check-in to the Emergency Department and triage nurse.

## 2020-04-28 ENCOUNTER — Other Ambulatory Visit (HOSPITAL_COMMUNITY): Payer: Self-pay

## 2020-04-30 ENCOUNTER — Other Ambulatory Visit: Payer: Self-pay | Admitting: Student

## 2020-04-30 ENCOUNTER — Other Ambulatory Visit: Payer: Self-pay

## 2020-04-30 DIAGNOSIS — Z515 Encounter for palliative care: Secondary | ICD-10-CM

## 2020-04-30 DIAGNOSIS — C154 Malignant neoplasm of middle third of esophagus: Secondary | ICD-10-CM

## 2020-04-30 NOTE — Progress Notes (Signed)
[facility] Consult Note Telephone: [phone]  Fax: [phone]  PATIENT NAME: Jimmy Hawkins [address] [place] [address] [phone] (home)  DOB: 20-Apr-1975 MRN: [id number]  PRIMARY CARE PROVIDER:    [name], MD,  [place] [address] [phone]  REFERRING PROVIDER:   Truitt Hawkins, [place],  [place] [address] [phone]  RESPONSIBLE PARTY:   Extended Emergency Contact Information Primary Emergency Contact: Jimmy Hawkins, [place] [address] [place] Hawkins of [place] Phone: [phone] Mobile Phone: [phone] Relation: Niece Preferred language: English Interpreter needed? No Secondary Emergency Contact: Jimmy Hawkins S Address: [place], [place] [address] [place] Phone: [phone] Mobile Phone: [phone] Relation: Sister Preferred language: Spanish  I met face to face with patient and family in the home.  ASSESSMENT AND RECOMMENDATIONS:   Advance Care Planning: Visit at the request of Dr. Burr Medico for palliative consult. Visit consisted of building trust and discussions on Palliative care medicine as specialized medical care for people living with serious illness, aimed at facilitating improved quality of life through symptoms relief, assisting with advance care planning and establishing goals of care. Palliative care will continue to provide support to patient, family and the medical team.  Goal of care: To start immunotherapy, treatment as directed.   Directives: MOST form introduced; family to discuss. Will fill out on next visit.   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 Follow up with Oncology as scheduled.   Sacral back pain- patient with LE  weakness secondary to bone metastasis. continue MS Contin 37m every 12 hours routinely, hydrocodone-acetaminophen 7.5-325mg/15ml take 191mQID PRN, recommend gabapentin 10031mHS. PT evaluation recommended; pending consult. Use walker as directed.  Appetite-poor to fair appetite in past month; start mirtazapine 7.5mg18mS. Monitor weight; well balanced diet encouraged.    Follow up Palliative Care Visit: Palliative care will continue to follow for complex decision making and symptom management. Return in 4 weeks or prn.  Family /Caregiver/Community Supports: Palliative Medicine will continue to provide support. Patient to start HHPT; awaiting initial consult.   I spent 45 minutes providing this consultation, time includes time spent with patient/family, chart review, provider coordination, and documentation. More than 50% of the time in this consultation was spent counseling and coordinating communication.   CHIEF COMPLAINT: Palliative Medicine initial consult.   History obtained from review of EMR, discussion with primary team, and  interview with family. Records reviewed and summarized below.  HISTORY OF PRESENT ILLNESS:  [name] 44 y69. 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 Previous treatments include concurrent chemoradiation 8/2-10/06/19, chemotherapy 12/20/19-02/2020, palliative radiation 2/28-3/15/22 to sacrum. PET scan IMPRESSION on 04/11/2020: 1. New hypermetabolic expansile 6.1 cm upper left sacral lytic bone metastasis, centrally necrotic. 2. New hypermetabolic left level 1b and left level 2 neck lymph nodes, indeterminate for reactive versus metastatic nodes. 3. Persistent hypermetabolism associated with a short segment of mid to lower thoracic esophagus with associated circumferential esophageal wall thickening, similar in metabolism and decreased in wall thickness since  11/11/2019 PET-CT, nonspecific, favor post treatment change, with residual neoplasm not excluded. 4. No additional sites of hypermetabolic metastatic disease. 5. Chronic  findings include: Cholelithiasis.  Palliative Care was asked to follow this patient by consultation request of Jimmy Merle, MD to help address advance care planning and goals of care. This is an initial visit.  Patient reports moderate pain, taking MS Contin every 12 hours. He reports pain constant pain to left sacral/coccyx region, radiating down leg to his foot. He also reports numbness to left foot. He reports occasional nausea, takes prochlorperazine prn with effectiveness. He endorses weakness, using walker for ambulation; pending PT referral. Fall x 1 month ago. Endorses a decline in appetite; 4 pound weight loss in past month. Occasional constipation; takes Miralax with effectiveness. Sleeping well at night.   CODE STATUS: Full Code  PPS: 50%  HOSPICE ELIGIBILITY/DIAGNOSIS: TBD  ROS:   General: NAD EYES: denies vision changes ENMT: denies dysphagia Cardiovascular: denies chest pain Pulmonary: denies cough, denies increased SOB Abdomen: endorses poor to fair appetite, occasional constipation GU: denies dysuria MSK: fall 1 month ago reported Skin: rash to upper back Neurological: endorses weakness, numbness to left foot denies insomnia Psych: Endorses positive mood Heme/lymph/immuno: denies bruises, abnormal bleeding   Physical Exam: Pulse 88, resp 16, b/p 120/80, sats 98% on room air Constitutional: NAD General: frail appearing EYES: anicteric sclera, lids intact, no discharge  ENMT: intact hearing, oral mucous membranes moist CV: RRR,  no LE edema Pulmonary: LCTA, no increased work of breathing, no cough Abdomen: BS normoactive x 4 GU: deferred MSK: ambulatory Skin: warm and dry, scattered rash to upper back Neuro: Generalized weakness; tenderness to left sacral region Psych: non-anxious affect  today Hem/lymph/immuno: no widespread bruising   PAST MEDICAL HISTORY:  Past Medical History:  Diagnosis Date  . GERD (gastroesophageal reflux disease)   . Hematemesis with nausea 08/10/2019    SOCIAL HX:  Social History   Tobacco Use  . Smoking status: Never Smoker  . Smokeless tobacco: Never Used  Substance Use Topics  . Alcohol use: Not Currently    Alcohol/week: 6.0 standard drinks    Types: 6 Cans of beer per week    Comment:  6 drinks/day   FAMILY HX:  Family History  Problem Relation Age of Onset  . Stomach cancer Mother   . Healthy Father     ALLERGIES: No Known Allergies   PERTINENT MEDICATIONS:  Outpatient Encounter Medications as of 04/30/2020  Medication Sig  . diphenhydrAMINE-APAP, sleep, (TYLENOL PM EXTRA STRENGTH) 50-1000 MG/30ML LIQD Take 15 mLs by mouth at bedtime as needed (sleep).  . famotidine (PEPCID) 20 MG tablet TOME 1 PASTILLA POR VIA ORAL DOS VECES AL DIA  . Ferrous Sulfate (IRON) 325 (65 Fe) MG TABS Take 1 tablet (325 mg total) by mouth 2 (two) times daily.  . ferrous sulfate 325 (65 FE) MG tablet TAKE 1 TABLET BY MOUTH 2 TIMES DAILY.  Marland Kitchen HYDROcodone-acetaminophen (HYCET) 7.5-325 mg/15 ml solution TAKE 15MLS POR VIA ORAL 4 VECES AL DIA SEGUN SEA NECESARIO PARA UN DOLOR MODERADO  . methylPREDNISolone (MEDROL DOSEPAK) 4 MG TBPK tablet TOME TODAS LAS 6 PASTILLAS POR VIA ORAL EL PRIMER DIA, LUEGO DISMINUYA 1 PASTILLA CADA DIA (6-5-4-3-2-1)  . mirtazapine (REMERON) 7.5 MG tablet TOME 1 PASTILLA POR VIA ORAL A LA HORA DE ACOSTARSE  . morphine (MS CONTIN) 30 MG 12 hr tablet TOME 1 PASTILLA POR VIA ORAL CADA 12 HORAS  . prochlorperazine (COMPAZINE) 10 MG tablet TOME 1 PASTILLA POR VIA ORAL CADA 6 HORAS SEGUN SEA NECESARIO FOR NAUSEAS OR VOMITING  . sucralfate (CARAFATE) 1 g tablet Take 1 tablet (  1 g total) by mouth 4 (four) times daily. Dissolve each tablet in 15 cc water before use.  . [DISCONTINUED] omeprazole (PRILOSEC) 40 MG capsule Take 1 capsule (40 mg  total) by mouth daily.  . [DISCONTINUED] pantoprazole (PROTONIX) 40 MG tablet Take 1 tablet (40 mg total) by mouth 2 (two) times daily.   No facility-administered encounter medications on file as of 04/30/2020.     Thank you for the opportunity to participate in the care of [name]. The palliative care team will continue to follow. Please call our office at [phone] if we can be of additional assistance.  Ezekiel Slocumb, NP

## 2020-05-01 ENCOUNTER — Other Ambulatory Visit (HOSPITAL_COMMUNITY): Payer: Self-pay

## 2020-05-02 ENCOUNTER — Other Ambulatory Visit: Payer: Self-pay

## 2020-05-02 DIAGNOSIS — C7951 Secondary malignant neoplasm of bone: Secondary | ICD-10-CM

## 2020-05-02 DIAGNOSIS — C154 Malignant neoplasm of middle third of esophagus: Secondary | ICD-10-CM

## 2020-05-03 ENCOUNTER — Other Ambulatory Visit (HOSPITAL_COMMUNITY): Payer: Self-pay

## 2020-05-03 MED ORDER — GABAPENTIN 100 MG PO CAPS
ORAL_CAPSULE | ORAL | 3 refills | Status: DC
  Filled 2020-05-03: qty 30, 30d supply, fill #0

## 2020-05-07 ENCOUNTER — Other Ambulatory Visit (HOSPITAL_COMMUNITY): Payer: Self-pay

## 2020-05-07 ENCOUNTER — Other Ambulatory Visit: Payer: Self-pay | Admitting: Hematology

## 2020-05-07 MED ORDER — HYDROCODONE-ACETAMINOPHEN 7.5-325 MG/15ML PO SOLN
ORAL | 0 refills | Status: DC
  Filled 2020-05-07: qty 473, 8d supply, fill #0

## 2020-05-08 ENCOUNTER — Other Ambulatory Visit (HOSPITAL_COMMUNITY): Payer: Self-pay

## 2020-05-08 MED FILL — Mirtazapine Tab 7.5 MG: ORAL | 30 days supply | Qty: 30 | Fill #0 | Status: AC

## 2020-05-09 ENCOUNTER — Other Ambulatory Visit: Payer: Self-pay

## 2020-05-09 DIAGNOSIS — C154 Malignant neoplasm of middle third of esophagus: Secondary | ICD-10-CM

## 2020-05-09 DIAGNOSIS — M25552 Pain in left hip: Secondary | ICD-10-CM

## 2020-05-09 DIAGNOSIS — C7951 Secondary malignant neoplasm of bone: Secondary | ICD-10-CM

## 2020-05-16 NOTE — Progress Notes (Signed)
[name]   Telephone:([phone] Fax:([phone]) [phone]   Clinic Follow up Note   Patient Care Team: [name], MD as PCP - General (Hematology) Jonnie Finner, RN as Oncology Nurse Navigator [name], MD as Consulting Physician (Oncology)  Date of Service:  05/18/2020  CHIEF COMPLAINT: F/u ofMiddleEsophageal Squamous CellCarcinoma  SUMMARY OF ONCOLOGIC HISTORY: Oncology History Overview Note  Cancer Staging Malignant neoplasm of middle third of esophagus ([place]) Staging form: Esophagus - Squamous Cell Carcinoma, AJCC 8th Edition - Clinical: Stage Unknown (cTX, cN1, cM0) - Signed by [name], MD on 08/18/2019    Malignant neoplasm of middle third of esophagus [name])   Initial Diagnosis   Malignant neoplasm of middle third of esophagus ([name])   08/11/2019 Imaging   CT CAP W contrast 08/11/19 IMPRESSION: 1. Mid to lower esophageal mass along an 8 cm segment, large endoluminal component, strongly favoring esophageal malignancy. Borderline enlarged AP window lymph node. No findings of distant metastatic disease. 2. Trace bilateral pleural effusions. 3. Diffuse hepatic steatosis. 4. Cholelithiasis. 5. Fatty spermatic cords likely due to small bilateral indirect inguinal hernias.   08/11/2019 Procedure   EGD by Dr Carlean Purl  IMPRESSION - Partially obstructing, likely malignant esophageal tumor was found in the upper third of the esophagus and in the middle third of the esophagus. Biopsied. Very large and long - difficult but able to advance scope by this lesion - await CT for better length estimate - Normal stomach. - Normal examined duodenum.   08/11/2019 Initial Biopsy   FINAL MICROSCOPIC DIAGNOSIS:   A. ESOPHAGUS, UPPER, BIOPSY:  - Squamous cell carcinoma.    08/11/2019 Genetic Testing   PD-L1 Expression 90%   08/18/2019 Cancer Staging   Staging form: Esophagus - Squamous Cell Carcinoma, AJCC 8th Edition - Clinical: Stage Unknown (cTX, cN1, cM0) -  Signed by [name], MD on 08/18/2019   08/29/2019 - 10/04/2019 Chemotherapy   Concurrent chemoradiation with weekly CT for 6 weeks starting 08/29/19-10/04/19    08/29/2019 - 10/06/2019 Radiation Therapy   Concurrent chemoradiation with Dr Lisbeth Renshaw starting 08/29/19-10/06/19   11/11/2019 PET scan   IMPRESSION: 1. Again seen is a long segment of FDG avid wall thickening of the distal half of the thoracic esophagus compatible with known esophageal neoplasm. 2. No signs of FDG avid nodal or distant metastatic disease.     11/28/2019 Procedure   EUS by Dr Rush Landmark IMPRESSION EGD Impression: - No gross lesions in esophagus proximally. - Previously noted esophageal cancer is not present. However, in its place and extending distal to this is likely LA Grade D chemotherapy/radiation esophagitis with bleeding. Biopsied after the EUS was complete to evaluate for persistent disease or not. - Z-line regular, 37 cm from the incisors. - 3 cm hiatal hernia. - Erythematous mucosa in the stomach. No other gross lesions in the stomach. Biopsied. - No gross lesions in the duodenal bulb, in the first portion of the duodenum and in the second portion of the duodenum. EUS Impression: - Wall thickening was seen in the thoracic esophagus into the gastroesophageal junction. The thickening appeared to primarily be within the luminal interface/superficial mucosa (Layer 1) and deep mucosa (Layer 2). The previously noted esophageal cancer has been replaced by what appears to be radiation/chemotherapy induced changes. With the limitations of having to use the mini-probe EUS, I could not visualize persistent significant disease other than the entire distal area of the esophagus having likely changes noted as above. - No malignant-appearing lymph nodes were visualized  in the middle paraesophageal mediastinum (level 29M), lower paraesophageal mediastinum (level 8L), diaphragmatic region (level 15) and paracardial region (level  16).      FINAL MICROSCOPIC DIAGNOSIS:   A. STOMACH, BIOPSY:  - Mildly active chronic gastritis.  - Warthin-Starry negative for Helicobacter pylori.  - No intestinal metaplasia, dysplasia or carcinoma.   B. ESOPHAGUS, BIOPSY:  - Inflamed granulation tissue and exudate consistent with ulcer.  - No malignancy identified.    12/20/2019 - 02/2020 Chemotherapy   CAPOX q3weeks with Xeloda 1531m BID 2 weeks on/1 week off starting 12/20/19-02/2020   03/21/2020 [facility]   [facility]Circumferential thickening of the lower third of the esophagus which corresponds with known esophageal malignancy. No evidence of metastatic disease within the chest.  --For findings below the diaphragm, please see concurrent separately reported CT of the abdomen and pelvis.  --Expansile lytic mass with associated soft tissue component centered in the left sacral ala and abutting the left S1 and S2 nerve roots, left internal iliac vasculature, and left pyriformis muscle with no other metastatic disease within the abdomen or pelvis.   --Diffuse thickening of the distal esophagus compatible with known esophageal malignancy.   --Cholelithiasis with nonspecific gallbladder distention. Clinical correlation is recommended. If there is clinical concern for cholecystitis, further evaluation with ultrasound can be obtained.   03/21/2020 Imaging   CT Lumbar Spine  Expansile lytic lesion centered in the left sacral ala with no other evidence of metastatic disease in the lumbar spine.   03/26/2020 - 04/10/2020 Radiation Therapy   Palliative Radiation to Sacrum with Dr MLisbeth Renshaw2/28/22-3/15/22.    04/11/2020 Progression   PET  IMPRESSION: 1. New hypermetabolic expansile 6.1 cm upper left sacral lytic bone metastasis, centrally necrotic. 2. New hypermetabolic left level 1b and left level 2 neck lymph nodes, indeterminate for reactive versus metastatic nodes. 3. Persistent hypermetabolism associated with a short segment of  mid to lower thoracic esophagus with associated circumferential esophageal wall thickening, similar in metabolism and decreased in wall thickness since 11/11/2019 PET-CT, nonspecific, favor post treatment change, with residual neoplasm not excluded. 4. No additional sites of hypermetabolic metastatic disease. 5.  Chronic findings include: Cholelithiasis.   04/27/2020 -  Chemotherapy   First-line with immunotherapy Keytruda q3weeks starting 04/27/20          CURRENT THERAPY:  First-line Keytruda q3weeks starting 04/27/20  INTERVAL HISTORY:  VKatie Mochis here for a follow up. He was last seen by me on 04/27/20. He presents to the clinic with his sister and interpreter.  He is left hip pain has much improved, he rates at a 4 out of 10.  He has been taking MS Contin 30 mg every 8 hours, and liquid hydrocodone twice a day.  He has developed a new pain in the mid back, he rates it a 5/10, constant, bending over makes it worse.  His energy and appetite has also improved, he is able to walk with [place] more, and slightly more active at home.  No other new complaints.  He tolerated first Keytruda infusion well without noticeable side effects.  All other systems were reviewed with the patient and are negative.  MEDICAL HISTORY:  Past Medical History:  Diagnosis Date  . GERD (gastroesophageal reflux disease)   . Hematemesis with nausea 08/10/2019    SURGICAL HISTORY: Past Surgical History:  Procedure Laterality Date  . BIOPSY  08/11/2019   Procedure: BIOPSY;  Surgeon: GGatha Mayer MD;  [place]  Service:  Endoscopy;;  . BIOPSY  11/28/2019   Procedure: BIOPSY;  Surgeon: [name], MD;  Location: [place];  Service: Gastroenterology;;  . ESOPHAGOGASTRODUODENOSCOPY  08/11/2019  . ESOPHAGOGASTRODUODENOSCOPY (EGD) WITH PROPOFOL N/A 08/11/2019   Procedure: ESOPHAGOGASTRODUODENOSCOPY (EGD) WITH PROPOFOL;  Surgeon: [name], MD;  Location: [place];  Service: Endoscopy;  Laterality: N/A;  . ESOPHAGOGASTRODUODENOSCOPY (EGD) WITH PROPOFOL N/A 11/28/2019   Procedure: ESOPHAGOGASTRODUODENOSCOPY (EGD) WITH PROPOFOL;  Surgeon: Rush Landmark Telford Nab., MD;  Location: [place];  Service: Gastroenterology;  Laterality: N/A;  . EUS N/A 11/28/2019   Procedure: UPPER ENDOSCOPIC ULTRASOUND (EUS) RADIAL;  Surgeon: Rush Landmark Telford Nab., MD;  Location: [place];  Service: Gastroenterology;  Laterality: N/A;    I have reviewed the social history and family history with the patient and they are unchanged from previous note.  ALLERGIES:  has No Known Allergies.  MEDICATIONS:  Current Outpatient Medications  Medication Sig Dispense Refill  . diphenhydrAMINE-APAP, sleep, (TYLENOL PM EXTRA STRENGTH) 50-1000 MG/30ML LIQD Take 15 mLs by mouth at bedtime as needed (sleep).    . famotidine (PEPCID) 20 MG tablet TOME 1 PASTILLA POR VIA ORAL DOS VECES AL DIA 60 tablet 3  . Ferrous Sulfate (IRON) 325 (65 Fe) MG TABS Take 1 tablet (325 mg total) by mouth 2 (two) times daily. 60 tablet 3  . ferrous sulfate 325 (65 FE) MG tablet TAKE 1 TABLET BY MOUTH 2 TIMES DAILY. 60 tablet 3  . gabapentin (NEURONTIN) 100 MG capsule Take 1 capsule by mouth every night 30 capsule 3  . HYDROcodone-acetaminophen (HYCET) 7.5-325 mg/15 ml solution Tome 15MLS POR VIA ORAL 4 VECES AL DIA SEGUN SEA NECESARIO PARA UN DOLOR MODERADO 473 mL 0  . methylPREDNISolone (MEDROL DOSEPAK) 4 MG TBPK tablet TOME TODAS LAS 6 PASTILLAS POR VIA ORAL EL PRIMER DIA, LUEGO DISMINUYA 1 PASTILLA CADA DIA (6-5-4-3-2-1) 21 each 0  . mirtazapine (REMERON) 7.5 MG tablet TOME 1 PASTILLA POR VIA ORAL A LA HORA DE ACOSTARSE 30 tablet 0  . morphine (MS CONTIN) 30 MG 12 hr tablet Take 1 tablet (30 mg total) by mouth every 8 (eight) hours. 90 tablet 0  . prochlorperazine (COMPAZINE) 10 MG tablet TOME 1 PASTILLA POR VIA ORAL CADA 6 HORAS SEGUN SEA NECESARIO FOR NAUSEAS OR VOMITING 30 tablet 1  .  sucralfate (CARAFATE) 1 g tablet Take 1 tablet (1 g total) by mouth 4 (four) times daily. Dissolve each tablet in 15 cc water before use. 120 tablet 2   No current facility-administered medications for this visit.    PHYSICAL EXAMINATION: ECOG PERFORMANCE STATUS: 3 - Symptomatic, >50% confined to bed  Vitals:   05/18/20 0759  BP: 126/84  Pulse: (!) 119  Resp: 16  Temp: 98.3 F (36.8 C)  SpO2: 98%   Filed Weights   05/18/20 0759  Weight: 158 lb 6.4 oz (71.8 kg)    GENERAL:alert, no distress and comfortable SKIN: skin color, texture, turgor are normal, no rashes or significant lesions EYES: normal, Conjunctiva are pink and non-injected, sclera clear NECK: supple, thyroid normal size, non-tender, without nodularity LYMPH:  no palpable lymphadenopathy in the cervical, axillary  LUNGS: clear to auscultation and percussion with normal breathing effort HEART: regular rate & rhythm and no murmurs and no lower extremity edema ABDOMEN:abdomen soft, non-tender and normal bowel sounds Musculoskeletal:no cyanosis of digits and no clubbing, mild tenderness in the mid back NEURO: alert & oriented x 3 with fluent speech, no focal motor/sensory deficits  LABORATORY DATA:  I have  reviewed the data as listed CBC Latest Ref Rng & Units 05/18/2020 04/27/2020 04/13/2020  WBC 4.0 - 10.5 K/uL 2.7(L) 3.7(L) 3.0(L)  Hemoglobin 13.0 - 17.0 g/dL 14.4 13.4 13.3  Hematocrit 39.0 - 52.0 % 42.4 37.8(L) 37.5(L)  Platelets 150 - 400 K/uL 175 177 123(L)     CMP Latest Ref Rng & Units 05/18/2020 04/27/2020 04/13/2020  Glucose 70 - 99 mg/dL 123(H) 136(H) 98  BUN 6 - 20 mg/dL <4(L) 5(L) 5(L)  Creatinine 0.61 - 1.24 mg/dL 0.63 0.67 0.59(L)  Sodium 135 - 145 mmol/L 139 140 138  Potassium 3.5 - 5.1 mmol/L 3.6 3.7 3.7  Chloride 98 - 111 mmol/L 106 103 102  CO2 22 - 32 mmol/L '23 25 26  ' Calcium 8.9 - 10.3 mg/dL 9.2 9.3 9.2  Total Protein 6.5 - 8.1 g/dL 7.3 7.6 7.8  Total Bilirubin 0.3 - 1.2 mg/dL 0.8 0.6 0.5   Alkaline Phos 38 - 126 U/L 71 80 127(H)  AST 15 - 41 U/L '22 18 19  ' ALT 0 - 44 U/L '7 12 19      ' RADIOGRAPHIC STUDIES: I have personally reviewed the radiological images as listed and agreed with the findings in the report. No results found.   ASSESSMENT & PLAN:  Jimmy Hawkins is a 45 y.o. 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  -He completed 6 weeks of concurrent chemoRT withweekly Carboplatin and Taxol 08/29/19-10/06/19 -Repeat PET scan October 2021 showedhypermetabolic wall thickening of the distal half of the thoracic esophageus,no notable distant metastasis. Dr. Kipp Brood has reviewed the PET scan, and feels that his tumor invadestracheais it'snot resectable. He will f/u with Dr Kipp Brood on 01/06/20. -He completedconsolidation chemotherapy with CAPOX q3weeks withXeloda 1577m BID 2 weeks on/1 week offstarting 12/20/19-02/2020. -His PET from 04/11/20 showed6.1cmbone metastasis in sacrum and indeterminate cervical LN. -He completed palliativeRadiationto sacrumwith Dr MLisbeth Renshaw2/28/22-3/15/22.  -He has started single agent Keytruda 3 weeks ago, tolerated well. -His left hip pain has much improved, most probably from radiation -He has now developed a new mid back pain, will get MRI of cervical and thoracic spine, to rule out a new metastasis -Also it is difficult to evaluate [facility], if he does have disease progression, I would recommend adding chemotherapy carboplatin and Taxol, given his squamous cell histology.  If he needs radiation, will do weekly, otherwise plan to do every [redacted] weeks along with Keytruda. -Follow-up in 3 weeks   2. Sacral/back pain, LE weakness,  secondary to bone metastasis  -He was seen to have bone mets on 04/11/20 PET scan.  -He completed palliativeRadiationto sacrumwith Dr MLisbeth Renshaw2/28/22-3/15/22. He was given short course dexa 472m  -he is on MS Contin and hydrocodone as needed -He is scheduled to see palliative medicine Dr. GoHilma Favorsoday  3.H/oHeavy alcohol user -He quitdrinkingsince cancer diagnosis, but started again, drinks 3 beers a day -I strongly recommend stopping drinking alcohol, he agreesto try. Due to weakness, he is not able to drink as much (04/13/20).  4. Social and FiAcupuncturistPatient does not have insurance. -He has met withfinancial advocate Shauna and SWLoreli Dollare has grant assistance with medications. -Will see if he can get drug replacement for Keytruda.  -Patient, family and Interpretor notes difficulty with communication. They will set up Mychart for  more direct line.   PLAN: -Lab reviewed, adequate for treatment, will proceed second cycle Keytruda today -Cervical and thoracic spine MRI with and without contrast next week to evaluate due to his new pain -We will likely add on [place] and Taxol with next cycle Keytruda -Follow-up in 3 weeks -I reviewed MS Contin, hydrocodone, Compazine today    No problem-specific Assessment & Plan notes found for this encounter.   Orders Placed This Encounter  Procedures  . MR CERVICAL SPINE W WO CONTRAST    Standing Status:   Future    Standing Expiration Date:   05/18/2021    Order Specific Question:   If indicated for the ordered procedure, I authorize the administration of contrast media per Radiology protocol    Answer:   Yes    Order Specific Question:   What is the patient's sedation requirement?    Answer:   No Sedation    Order Specific Question:   Does the patient have a pacemaker or implanted devices?    Answer:   No    Order Specific Question:   Use SRS Protocol?    Answer:   No    Order Specific Question:   Preferred imaging  location?    Answer:   [facility] (table limit - 550 lbs)  . MR THORACIC SPINE W WO CONTRAST    Standing Status:   Future    Standing Expiration Date:   05/18/2021    Order Specific Question:   GRA to provide read?    Answer:   Yes    Order Specific Question:   If indicated for the ordered procedure, I authorize the administration of contrast media per Radiology protocol    Answer:   Yes    Order Specific Question:   What is the patient's sedation requirement?    Answer:   No Sedation    Order Specific Question:   Use SRS Protocol?    Answer:   No    Order Specific Question:   Does the patient have a pacemaker or implanted devices?    Answer:   No    Order Specific Question:   Preferred imaging location?    Answer:   [facility] (table limit - 550 lbs)   All questions were answered. The patient knows to call the clinic with any problems, questions or concerns. No barriers to learning was detected. The total time spent in the appointment was 30 minutes.     [name], MD 05/18/2020   I, Joslyn Devon, am acting as scribe for [name], MD.   I have reviewed the above documentation for accuracy and completeness, and I agree with the above.

## 2020-05-18 ENCOUNTER — Inpatient Hospital Stay: Payer: Self-pay

## 2020-05-18 ENCOUNTER — Encounter: Payer: Self-pay | Admitting: Hematology

## 2020-05-18 ENCOUNTER — Other Ambulatory Visit: Payer: Self-pay

## 2020-05-18 ENCOUNTER — Other Ambulatory Visit (HOSPITAL_COMMUNITY): Payer: Self-pay

## 2020-05-18 ENCOUNTER — Inpatient Hospital Stay (HOSPITAL_BASED_OUTPATIENT_CLINIC_OR_DEPARTMENT_OTHER): Payer: Self-pay | Admitting: Hematology

## 2020-05-18 VITALS — BP 126/84 | HR 119 | Temp 98.3°F | Resp 16 | Ht 66.0 in | Wt 158.4 lb

## 2020-05-18 DIAGNOSIS — C154 Malignant neoplasm of middle third of esophagus: Secondary | ICD-10-CM

## 2020-05-18 DIAGNOSIS — C7951 Secondary malignant neoplasm of bone: Secondary | ICD-10-CM

## 2020-05-18 LAB — CBC WITH DIFFERENTIAL (CANCER CENTER ONLY)
Abs Immature Granulocytes: 0.06 10*3/uL (ref 0.00–0.07)
Basophils Absolute: 0 10*3/uL (ref 0.0–0.1)
Basophils Relative: 0 %
Eosinophils Absolute: 0.1 10*3/uL (ref 0.0–0.5)
Eosinophils Relative: 3 %
HCT: 42.4 % (ref 39.0–52.0)
Hemoglobin: 14.4 g/dL (ref 13.0–17.0)
Immature Granulocytes: 2 %
Lymphocytes Relative: 19 %
Lymphs Abs: 0.5 10*3/uL — ABNORMAL LOW (ref 0.7–4.0)
MCH: 32.7 pg (ref 26.0–34.0)
MCHC: 34 g/dL (ref 30.0–36.0)
MCV: 96.4 fL (ref 80.0–100.0)
Monocytes Absolute: 0.4 10*3/uL (ref 0.1–1.0)
Monocytes Relative: 15 %
Neutro Abs: 1.7 10*3/uL (ref 1.7–7.7)
Neutrophils Relative %: 61 %
Platelet Count: 175 10*3/uL (ref 150–400)
RBC: 4.4 MIL/uL (ref 4.22–5.81)
RDW: 13.2 % (ref 11.5–15.5)
WBC Count: 2.7 10*3/uL — ABNORMAL LOW (ref 4.0–10.5)
nRBC: 0 % (ref 0.0–0.2)

## 2020-05-18 LAB — TSH: TSH: 1.44 u[IU]/mL (ref 0.320–4.118)

## 2020-05-18 LAB — CMP (CANCER CENTER ONLY)
ALT: 7 U/L (ref 0–44)
AST: 22 U/L (ref 15–41)
Albumin: 3.6 g/dL (ref 3.5–5.0)
Alkaline Phosphatase: 71 U/L (ref 38–126)
Anion gap: 10 (ref 5–15)
BUN: <4 mg/dL — ABNORMAL LOW (ref 6–20)
CO2: 23 mmol/L (ref 22–32)
Calcium: 9.2 mg/dL (ref 8.9–10.3)
Chloride: 106 mmol/L (ref 98–111)
Creatinine: 0.63 mg/dL (ref 0.61–1.24)
GFR, Estimated: >60 mL/min (ref >60)
Glucose, Bld: 123 mg/dL — ABNORMAL HIGH (ref 70–99)
Potassium: 3.6 mmol/L (ref 3.5–5.1)
Sodium: 139 mmol/L (ref 135–145)
Total Bilirubin: 0.8 mg/dL (ref 0.3–1.2)
Total Protein: 7.3 g/dL (ref 6.5–8.1)

## 2020-05-18 MED ORDER — SODIUM CHLORIDE 0.9 % IV SOLN
200.0000 mg | Freq: Once | INTRAVENOUS | Status: AC
  Administered 2020-05-18: 200 mg via INTRAVENOUS
  Filled 2020-05-18: qty 8

## 2020-05-18 MED ORDER — PROCHLORPERAZINE MALEATE 10 MG PO TABS
ORAL_TABLET | ORAL | 1 refills | Status: DC
  Filled 2020-05-18: qty 30, 7d supply, fill #0

## 2020-05-18 MED ORDER — FERROUS SULFATE 325 (65 FE) MG PO TABS
ORAL_TABLET | Freq: Two times a day (BID) | ORAL | 3 refills | Status: AC
  Filled 2020-05-18: qty 60, fill #0

## 2020-05-18 MED ORDER — MORPHINE SULFATE ER 30 MG PO TBCR
30.0000 mg | EXTENDED_RELEASE_TABLET | Freq: Three times a day (TID) | ORAL | 0 refills | Status: DC
  Filled 2020-05-18: qty 90, 30d supply, fill #0

## 2020-05-18 MED ORDER — SODIUM CHLORIDE 0.9 % IV SOLN
Freq: Once | INTRAVENOUS | Status: AC
  Filled 2020-05-18: qty 250

## 2020-05-18 MED ORDER — HYDROCODONE-ACETAMINOPHEN 7.5-325 MG/15ML PO SOLN
ORAL | 0 refills | Status: DC
  Filled 2020-05-18: qty 473, 8d supply, fill #0

## 2020-05-18 NOTE — Patient Instructions (Signed)
[facility]ICAL [facility]COL[facility]  Discharge Instructions: Thank you for choosing [facility] to provide your oncology and hematology care.   If you have a lab appointment with the Cancer Center, please go directly to the Cancer Center and check in at the registration area.   Wear comfortable clothing and clothing appropriate for easy access to any Portacath or PICC line.   We strive to give you quality time with your provider. You may need to reschedule your appointment if you arrive late (15 or more minutes).  Arriving late affects you and other patients whose appointments are after yours.  Also, if you miss three or more appointments without notifying the office, you may be dismissed from the clinic at the provider's discretion.      For prescription refill requests, have your pharmacy contact our office and allow 72 hours for refills to be completed.    Today you received the following chemotherapy and/or immunotherapy agents: keytruda      To help prevent nausea and vomiting after your treatment, we encourage you to take your nausea medication as directed.  BELOW ARE SYMPTOMS THAT SHOULD BE REPORTED IMMEDIATELY: *FEVER GREATER THAN 100.4 F (38 C) OR HIGHER *CHILLS OR SWEATING *NAUSEA AND VOMITING THAT IS NOT CONTROLLED WITH YOUR NAUSEA MEDICATION *UNUSUAL SHORTNESS OF BREATH *UNUSUAL BRUISING OR BLEEDING *URINARY PROBLEMS (pain or burning when urinating, or frequent urination) *BOWEL PROBLEMS (unusual diarrhea, constipation, pain near the anus) TENDERNESS IN MOUTH AND THROAT WITH OR WITHOUT PRESENCE OF ULCERS (sore throat, sores in mouth, or a toothache) UNUSUAL RASH, SWELLING OR PAIN  UNUSUAL VAGINAL DISCHARGE OR ITCHING   Items with * indicate a potential emergency and should be followed up as soon as possible or go to the Emergency Department if any problems should occur.  Please show the CHEMOTHERAPY ALERT CARD or IMMUNOTHERAPY ALERT CARD at check-in to  the Emergency Department and triage nurse.  Should you have questions after your visit or need to cancel or reschedule your appointment, please contact [facility]  Dept: [phone]  and follow the prompts.  Office hours are 8:00 a.m. to 4:30 p.m. Monday - Friday. Please note that voicemails left after 4:00 p.m. may not be returned until the following business day.  We are closed weekends and major holidays. You have access to a nurse at all times for urgent questions. Please call the main number to the clinic Dept: [phone] and follow the prompts.   For any non-urgent questions, you may also contact your provider using MyChart. We now offer e-Visits for anyone 18 and older to request care online for non-urgent symptoms. For details visit [email or web address].   Also download the MyChart app! Go to the app store, search "MyChart", open the app, select [facility], and log in with your MyChart username and password.  Due to Covid, a mask is required upon entering the hospital/clinic. If you do not have a mask, one will be given to you upon arrival. For doctor visits, patients may have 1 support person aged 18 or older with them. For treatment visits, patients cannot have anyone with them due to current Covid guidelines and our immunocompromised population.   

## 2020-05-18 NOTE — Progress Notes (Signed)
Per Dr. Feng, ok to treat with elevated HR.  

## 2020-05-24 ENCOUNTER — Encounter: Payer: Self-pay | Admitting: General Practice

## 2020-05-24 NOTE — Progress Notes (Signed)
[place] CSW Progress Notes  Called patient at request of [place], she is trying to get patient help with home health physical therapy, wonders if he is eligible for Medicaid and disability.  CSW has worked with patient in the past, he declined referral to [facility] for disability assistance.  He stated he does have have a SSN and did not wish to pursue an application for Medicaid.    CSW attempted contact w help of [employer] Trilby Leaver [phone]).  No answer at either number, "unable to complete the call", no VM.  Interpreter was able to reach niece, Marcelina Morel, who will reach out to patient and have him call us back to discuss his needs.  CSW awaiting return call.  Edwyna Shell, LCSW Clinical Social Worker Phone:  [phone]

## 2020-05-26 ENCOUNTER — Other Ambulatory Visit: Payer: Self-pay

## 2020-05-26 ENCOUNTER — Ambulatory Visit (HOSPITAL_COMMUNITY)
Admission: RE | Admit: 2020-05-26 | Discharge: 2020-05-26 | Disposition: A | Discharge status: Home / Self Care | Payer: Self-pay | Source: Ambulatory Visit | Attending: Hematology | Admitting: Hematology

## 2020-05-26 DIAGNOSIS — C154 Malignant neoplasm of middle third of esophagus: Secondary | ICD-10-CM

## 2020-05-26 MED ORDER — GADOBUTROL 1 MMOL/ML IV SOLN
7.0000 mL | Freq: Once | INTRAVENOUS | Status: AC | PRN
  Administered 2020-05-26: 7 mL via INTRAVENOUS

## 2020-05-28 ENCOUNTER — Telehealth: Payer: Self-pay

## 2020-05-28 ENCOUNTER — Ambulatory Visit
Admission: RE | Admit: 2020-05-28 | Discharge: 2020-05-28 | Disposition: A | Discharge status: Home / Self Care | Payer: Self-pay | Source: Ambulatory Visit | Attending: Radiation Oncology | Admitting: Radiation Oncology

## 2020-05-28 DIAGNOSIS — C7951 Secondary malignant neoplasm of bone: Secondary | ICD-10-CM

## 2020-05-28 DIAGNOSIS — C154 Malignant neoplasm of middle third of esophagus: Secondary | ICD-10-CM

## 2020-05-28 NOTE — Progress Notes (Signed)
  Radiation Oncology         [phone] ________________________________  Name: Jimmy Hawkins MRN: [id number]  Date of Service: 05/28/2020  DOB: 1975-07-21  Post Treatment Telephone Note  Diagnosis: Recurrent metastatic cTxN1M0Squamous Cell Carcinoma of the Esophagus of the upper and mid esophagusinvolving the bone.  Interval Since Last Radiation:  7 weeks   03/26/2020 through 04/10/2020 Site Technique Total Dose (Gy) Dose per Fx (Gy) Completed Fx Beam Energies  Pelvis: Pelvis including Sacral Spine Complex 30/30 3 10/10 15X     Narrative:  The patient was contacted today for routine follow-up. During treatment he did very well with radiotherapy and did not have significant desquamation. He reports he still has some mild pain but this is much more manageble. He also had imaging of the cervical and thoracic spine yesterday and his imaging did not show concerns for metastases, rather disc disease and arthritic changes.  Impression/Plan: 1. Recurrent metastatic cTxN1M0Squamous Cell Carcinoma of the Esophagus of the upper and mid esophagusinvolving the bone.. The patient has been doing well since completion of radiotherapy. We discussed that we would be happy to continue to follow him as needed, but he will also continue to follow up with Dr. Burr Medico in medical oncology. The call was completed with [employer].      Carola Rhine, PAC

## 2020-05-28 NOTE — Telephone Encounter (Signed)
-----   Message from [name], MD sent at 05/27/2020  9:57 AM EDT ----- Please let pt know his MRI result, no metastatic disease in spine, I will talk to him about if he needs to see neurosurgeon for his pinched nerve in cervical spine. Thanks   [name]  05/27/2020

## 2020-05-28 NOTE — Telephone Encounter (Signed)
Pt called using Water quality scientist # [phone]) made aware of most recent MRI results and recommendation to see neurosurgeon. Pt states he understands

## 2020-06-01 ENCOUNTER — Other Ambulatory Visit: Payer: Self-pay | Admitting: Hematology

## 2020-06-01 ENCOUNTER — Other Ambulatory Visit (HOSPITAL_COMMUNITY): Payer: Self-pay

## 2020-06-01 MED ORDER — HYDROCODONE-ACETAMINOPHEN 7.5-325 MG/15ML PO SOLN
ORAL | 0 refills | Status: DC
  Filled 2020-06-01: qty 473, 23d supply, fill #0

## 2020-06-04 NOTE — Progress Notes (Addendum)
[name]   Telephone:(336) [phone] Fax:(336) [phone]   Clinic Follow up Note   Patient Care Team: [name], MD as PCP - General (Hematology) [name], MD as Consulting Physician (Oncology)  Date of Service:  06/08/2020  CHIEF COMPLAINT: F/u ofMiddleEsophageal Squamous CellCarcinoma  SUMMARY OF ONCOLOGIC HISTORY: Oncology History Overview Note  Cancer Staging Malignant neoplasm of middle third of esophagus ([name]) Staging form: Esophagus - Squamous Cell Carcinoma, AJCC 8th Edition - Clinical: Stage Unknown (cTX, cN1, cM0) - Signed by [name], MD on 08/18/2019    Malignant neoplasm of middle third of esophagus [facility])   Initial Diagnosis   Malignant neoplasm of middle third of esophagus ([name])   08/11/2019 Imaging   CT CAP W contrast 08/11/19 IMPRESSION: 1. Mid to lower esophageal mass along an 8 cm segment, large endoluminal component, strongly favoring esophageal malignancy. Borderline enlarged AP window lymph node. No findings of distant metastatic disease. 2. Trace bilateral pleural effusions. 3. Diffuse hepatic steatosis. 4. Cholelithiasis. 5. Fatty spermatic cords likely due to small bilateral indirect inguinal hernias.   08/11/2019 Procedure   EGD by Dr Carlean Purl  IMPRESSION - Partially obstructing, likely malignant esophageal tumor was found in the upper third of the esophagus and in the middle third of the esophagus. Biopsied. Very large and long - difficult but able to advance scope by this lesion - await CT for better length estimate - Normal stomach. - Normal examined duodenum.   08/11/2019 Initial Biopsy   FINAL MICROSCOPIC DIAGNOSIS:   A. ESOPHAGUS, UPPER, BIOPSY:  - Squamous cell carcinoma.    08/11/2019 Genetic Testing   PD-L1 Expression 90%   08/18/2019 Cancer Staging   Staging form: Esophagus - Squamous Cell Carcinoma, AJCC 8th Edition - Clinical: Stage Unknown (cTX, cN1, cM0) - Signed by [name], MD on 08/18/2019   08/29/2019 -  10/04/2019 Chemotherapy   Concurrent chemoradiation with weekly CT for 6 weeks starting 08/29/19-10/04/19    08/29/2019 - 10/06/2019 Radiation Therapy   Concurrent chemoradiation with Dr Lisbeth Renshaw starting 08/29/19-10/06/19   11/11/2019 PET scan   IMPRESSION: 1. Again seen is a long segment of FDG avid wall thickening of the distal half of the thoracic esophagus compatible with known esophageal neoplasm. 2. No signs of FDG avid nodal or distant metastatic disease.     11/28/2019 Procedure   EUS by Dr Rush Landmark IMPRESSION EGD Impression: - No gross lesions in esophagus proximally. - Previously noted esophageal cancer is not present. However, in its place and extending distal to this is likely LA Grade D chemotherapy/radiation esophagitis with bleeding. Biopsied after the EUS was complete to evaluate for persistent disease or not. - Z-line regular, 37 cm from the incisors. - 3 cm hiatal hernia. - Erythematous mucosa in the stomach. No other gross lesions in the stomach. Biopsied. - No gross lesions in the duodenal bulb, in the first portion of the duodenum and in the second portion of the duodenum. EUS Impression: - Wall thickening was seen in the thoracic esophagus into the gastroesophageal junction. The thickening appeared to primarily be within the luminal interface/superficial mucosa (Layer 1) and deep mucosa (Layer 2). The previously noted esophageal cancer has been replaced by what appears to be radiation/chemotherapy induced changes. With the limitations of having to use the mini-probe EUS, I could not visualize persistent significant disease other than the entire distal area of the esophagus having likely changes noted as above. - No malignant-appearing lymph nodes were visualized in the middle paraesophageal mediastinum (level 60M), lower  paraesophageal mediastinum (level 8L), diaphragmatic region (level 15) and paracardial region (level 16).      FINAL MICROSCOPIC DIAGNOSIS:   A.  STOMACH, BIOPSY:  - Mildly active chronic gastritis.  - Warthin-Starry negative for Helicobacter pylori.  - No intestinal metaplasia, dysplasia or carcinoma.   B. ESOPHAGUS, BIOPSY:  - Inflamed granulation tissue and exudate consistent with ulcer.  - No malignancy identified.    12/20/2019 - 02/2020 Chemotherapy   CAPOX q3weeks with Xeloda 1557m BID 2 weeks on/1 week off starting 12/20/19-02/2020   03/21/2020 Imaging   [facility]--Circumferential thickening of the lower third of the esophagus which corresponds with known esophageal malignancy. No evidence of metastatic disease within the chest.  --For findings below the diaphragm, please see concurrent separately reported CT of the abdomen and pelvis.  --Expansile lytic mass with associated soft tissue component centered in the left sacral ala and abutting the left S1 and S2 nerve roots, left internal iliac vasculature, and left pyriformis muscle with no other metastatic disease within the abdomen or pelvis.   --Diffuse thickening of the distal esophagus compatible with known esophageal malignancy.   --Cholelithiasis with nonspecific gallbladder distention. Clinical correlation is recommended. If there is clinical concern for cholecystitis, further evaluation with ultrasound can be obtained.   03/21/2020 Imaging   CT Lumbar Spine  Expansile lytic lesion centered in the left sacral ala with no other evidence of metastatic disease in the lumbar spine.   03/26/2020 - 04/10/2020 Radiation Therapy   Palliative Radiation to Sacrum with Dr MLisbeth Renshaw2/28/22-3/15/22.    04/11/2020 Progression   PET  IMPRESSION: 1. New hypermetabolic expansile 6.1 cm upper left sacral lytic bone metastasis, centrally necrotic. 2. New hypermetabolic left level 1b and left level 2 neck lymph nodes, indeterminate for reactive versus metastatic nodes. 3. Persistent hypermetabolism associated with a short segment of mid to lower thoracic esophagus with associated  circumferential esophageal wall thickening, similar in metabolism and decreased in wall thickness since 11/11/2019 PET-CT, nonspecific, favor post treatment change, with residual neoplasm not excluded. 4. No additional sites of hypermetabolic metastatic disease. 5.  Chronic findings include: Cholelithiasis.   04/27/2020 -  Chemotherapy   First-line with immunotherapy Keytruda q3weeks starting 04/27/20          CURRENT THERAPY:  First-line Keytruda q3weeks starting 04/27/20, plan to add weekly carbo AUC 1.5 and Taxol 611mm2 from cycle 4 on day 1, 8 every 21 days  INTERVAL HISTORY:  ViCamden Knoteks here for a follow up. He was last seen by me 05/18/20. He presents to the clinic with his sister and Spanish interpretor.   He is overall doing better, pain is well controlled.  He takes MS Contin every 8 hours, and hydrocodone twice a day.  He is able to move around with hatches more.  His neck pain is intermittent, overall not bad.  No other new complaints.  He is requesting a letter with his diagnosis and that she is the only one working which the only fiEmergency planning/management officeror the family so she can legally work in the [place]   REVIEW OF SYSTEMS:   Constitutional: Denies fevers, chills or abnormal weight loss Eyes: Denies blurriness of vision Ears, nose, mouth, throat, and face: Denies mucositis or sore throat Respiratory: Denies cough, dyspnea or wheezes Cardiovascular: Denies palpitation, chest discomfort or lower extremity swelling Gastrointestinal:  Denies nausea, heartburn or change in bowel habits Skin: Denies abnormal skin rashes Lymphatics: Denies new lymphadenopathy or easy bruising Neurological:Denies  numbness, tingling or new weaknesses Behavioral/Psych: Mood is stable, no new changes  All other systems were reviewed with the patient and are negative.  MEDICAL HISTORY:  Past Medical History:  Diagnosis Date  . GERD (gastroesophageal reflux disease)   . Hematemesis  with nausea 08/10/2019    SURGICAL HISTORY: Past Surgical History:  Procedure Laterality Date  . BIOPSY  08/11/2019   Procedure: BIOPSY;  Surgeon: [name], MD;  Location: [facility] ENDOSCOPY;  Service: Endoscopy;;  . BIOPSY  11/28/2019   Procedure: BIOPSY;  Surgeon: [name], MD;  Location: [place];  Service: Gastroenterology;;  . ESOPHAGOGASTRODUODENOSCOPY  08/11/2019  . ESOPHAGOGASTRODUODENOSCOPY (EGD) WITH PROPOFOL N/A 08/11/2019   Procedure: ESOPHAGOGASTRODUODENOSCOPY (EGD) WITH PROPOFOL;  Surgeon: [name], MD;  Location: [place];  Service: Endoscopy;  Laterality: N/A;  . ESOPHAGOGASTRODUODENOSCOPY (EGD) WITH PROPOFOL N/A 11/28/2019   Procedure: ESOPHAGOGASTRODUODENOSCOPY (EGD) WITH PROPOFOL;  Surgeon: Rush Landmark Telford Nab., MD;  Location: [place];  Service: Gastroenterology;  Laterality: N/A;  . EUS N/A 11/28/2019   Procedure: UPPER ENDOSCOPIC ULTRASOUND (EUS) RADIAL;  Surgeon: Rush Landmark Telford Nab., MD;  Location: [place];  Service: Gastroenterology;  Laterality: N/A;    I have reviewed the social history and family history with the patient and they are unchanged from previous note.  ALLERGIES:  has No Known Allergies.  MEDICATIONS:  Current Outpatient Medications  Medication Sig Dispense Refill  . diphenhydrAMINE-APAP, sleep, (TYLENOL PM EXTRA STRENGTH) 50-1000 MG/30ML LIQD Take 15 mLs by mouth at bedtime as needed (sleep).    . famotidine (PEPCID) 20 MG tablet TOME 1 PASTILLA POR VIA ORAL DOS VECES AL DIA 60 tablet 3  . Ferrous Sulfate (IRON) 325 (65 Fe) MG TABS Take 1 tablet (325 mg total) by mouth 2 (two) times daily. 60 tablet 3  . ferrous sulfate 325 (65 FE) MG tablet TAKE 1 TABLET BY MOUTH 2 TIMES DAILY. 60 tablet 3  . gabapentin (NEURONTIN) 100 MG capsule Take 1 capsule by mouth every night 30 capsule 3  . HYDROcodone-acetaminophen (HYCET) 7.5-325 mg/15 ml solution Take 10 mLs by mouth every 6 (six) hours as needed for moderate  pain. 473 mL 0  . methylPREDNISolone (MEDROL DOSEPAK) 4 MG TBPK tablet TOME TODAS LAS 6 PASTILLAS POR VIA ORAL EL PRIMER DIA, LUEGO DISMINUYA 1 PASTILLA CADA DIA (6-5-4-3-2-1) 21 each 0  . mirtazapine (REMERON) 7.5 MG tablet TOME 1 PASTILLA POR VIA ORAL A LA HORA DE ACOSTARSE 30 tablet 0  . morphine (MS CONTIN) 30 MG 12 hr tablet Take 1 tablet (30 mg total) by mouth every 8 (eight) hours. 90 tablet 0  . prochlorperazine (COMPAZINE) 10 MG tablet Take 1 tablet (10 mg total) by mouth every 6 (six) hours as needed for nausea or vomiting. 60 tablet 2  . sucralfate (CARAFATE) 1 g tablet Take 1 tablet (1 g total) by mouth 4 (four) times daily. Dissolve each tablet in 15 cc water before use. 120 tablet 2   No current facility-administered medications for this visit.    PHYSICAL EXAMINATION: ECOG PERFORMANCE STATUS: 3 - Symptomatic, >50% confined to bed  Vitals:   06/08/20 0802  BP: 128/89  Pulse: (!) 115  Resp: 16  Temp: 97.6 F (36.4 C)  SpO2: 98%   Filed Weights   06/08/20 0802  Weight: 161 lb 12.8 oz (73.4 kg)    GENERAL:alert, no distress and comfortable SKIN: skin color, texture, turgor are normal, no rashes or significant lesions EYES: normal, Conjunctiva are pink and non-injected, sclera clear  NECK: supple, thyroid normal size, non-tender, without nodularity LYMPH:  no palpable lymphadenopathy in the cervical, axillary  LUNGS: clear to auscultation and percussion with normal breathing effort HEART: regular rate & rhythm and no murmurs and no lower extremity edema ABDOMEN:abdomen soft, non-tender and normal bowel sounds Musculoskeletal:no cyanosis of digits and no clubbing  NEURO: alert & oriented x 3 with fluent speech, no focal motor/sensory deficits  LABORATORY DATA:  I have reviewed the data as listed CBC Latest Ref Rng & Units 06/08/2020 05/18/2020 04/27/2020  WBC 4.0 - 10.5 K/uL 3.5(L) 2.7(L) 3.7(L)  Hemoglobin 13.0 - 17.0 g/dL 14.4 14.4 13.4  Hematocrit 39.0 - 52.0 % 41.6  42.4 37.8(L)  Platelets 150 - 400 K/uL 174 175 177     CMP Latest Ref Rng & Units 06/08/2020 05/18/2020 04/27/2020  Glucose 70 - 99 mg/dL 113(H) 123(H) 136(H)  BUN 6 - 20 mg/dL <4(L) <4(L) 5(L)  Creatinine 0.61 - 1.24 mg/dL 0.68 0.63 0.67  Sodium 135 - 145 mmol/L 139 139 140  Potassium 3.5 - 5.1 mmol/L 3.7 3.6 3.7  Chloride 98 - 111 mmol/L 105 106 103  CO2 22 - 32 mmol/L _0 Calcium 8.9 - 10.3 mg/dL 9.3 9.2 9.3  Total Protein 6.5 - 8.1 g/dL 7.4 7.3 7.6  Total Bilirubin 0.3 - 1.2 mg/dL 0.5 0.8 0.6  Alkaline Phos 38 - 126 U/L 76 71 80  AST 15 - 41 U/L _1 ALT 0 - 44 U/L _2 RADIOGRAPHIC STUDIES: I have personally reviewed the radiological images as listed and agreed with the findings in the report. No results found.   ASSESSMENT & PLAN:  Jimmy Hawkins is a 45 y.o. 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  -He completed 6 weeks of concurrent chemoRT withweekly Carboplatin and Taxol 08/29/19-10/06/19 -Repeat PET scan October 2021 showedhypermetabolic wall thickening of the distal half of the thoracic esophageus,no notable distant metastasis. Dr. Kipp Brood has reviewed the PET scan, and feels that his tumor invadestracheais it'snot resectable. He will f/u with Dr Kipp Brood on 01/06/20. -He completedconsolidation chemotherapy with CAPOX q3weeks withXeloda 15108m BID 2 weeks on/1 week offstarting 12/20/19-02/2020. -His PET from 04/11/20 showed6.1cmbone metastasis in sacrum and indeterminate cervical LN. -He completed palliativeRadiationto sacrumwith Dr MLisbeth Renshaw2/28/22-3/15/22.  -He has started First-line single agent Keytruda 04/27/20, tolerated well. -due to his slow improvement, I  recommend adding chemotherapy with weekly carboplatin and Taxol on day 1 and 8 every 21 days from next cycle, given his squamous cell histology.   -Labs reviewed, WBC 3.5, BG 113. Overall adequate to proceed with Keytruda today. Continue every 3 weeks.  -I reviewed his recent neck and thoracic MRI results, which was negative for pulm metastasis.  He does have multilevel cervical spondylosis, his neck pain is intermittent and tolerable, he would like to hold off neurosurgery referral. -f/u in 3 weeks.  -plan to rescan after cycle 4 or 5  2. Sacral/back pain, LE weakness, secondary to bone metastasis  -He was seen to have bone mets on 04/11/20 PET scan.  -He completed palliativeRadiationto sacrumwith Dr MLisbeth Renshaw2/28/22-3/15/22. He was given short course dexa 453m -He ison MS Contin and hydrocodone as needed -Continue to f/u with palliative medicine Dr. GoHilma Favors3.H/oHeavy alcohol user -He quitdrinkingsince  cancer diagnosis, but started again, drinks 3 beers a day -I strongly recommend stopping drinking alcohol, he agreesto try. Due to weakness, he is not able to drink as much since 04/13/20.  4. Social and Acupuncturist -Patient does not have insurance. -He has met withfinancial advocate Shauna and Loreli Dollar he has grant assistance with medications. -Will see if he can get drug replacement for Keytruda.  -Patient, family and Interpretor notes difficulty with communication. They will set up Mychart for more direct line. -He will continue to f/u with SW for support    PLAN: -Labs reviewed, adequate to proceed with Keytruda today  -Lab, f/u and Keytruda in 3 weeks.  -Plan to add on weekly carboplatin and Taxol on day 1, 8 every 21 days from next cycle with dose reduction due to previous chemo and RT and neuropathy  -Pt Requested letter for wife to work   No problem-specific Assessment & Plan notes found for this encounter.   No orders of the defined types were placed in  this encounter.  All questions were answered. The patient knows to call the clinic with any problems, questions or concerns. No barriers to learning was detected. The total time spent in the appointment was 40 minutes.     [name], MD 06/08/2020   I, Joslyn Devon, am acting as scribe for [name], MD.   I have reviewed the above documentation for accuracy and completeness, and I agree with the above.

## 2020-06-05 ENCOUNTER — Other Ambulatory Visit (HOSPITAL_COMMUNITY): Payer: Self-pay

## 2020-06-08 ENCOUNTER — Inpatient Hospital Stay: Payer: Self-pay | Attending: Hematology | Admitting: Hematology

## 2020-06-08 ENCOUNTER — Inpatient Hospital Stay: Payer: Self-pay

## 2020-06-08 ENCOUNTER — Other Ambulatory Visit (HOSPITAL_COMMUNITY): Payer: Self-pay

## 2020-06-08 ENCOUNTER — Other Ambulatory Visit: Payer: Self-pay

## 2020-06-08 ENCOUNTER — Inpatient Hospital Stay: Payer: Self-pay | Admitting: General Practice

## 2020-06-08 ENCOUNTER — Encounter: Payer: Self-pay | Admitting: Hematology

## 2020-06-08 VITALS — BP 128/89 | HR 115 | Temp 97.6°F | Resp 16 | Ht 66.0 in | Wt 161.8 lb

## 2020-06-08 VITALS — HR 105

## 2020-06-08 DIAGNOSIS — Z923 Personal history of irradiation: Secondary | ICD-10-CM | POA: Insufficient documentation

## 2020-06-08 DIAGNOSIS — C154 Malignant neoplasm of middle third of esophagus: Secondary | ICD-10-CM

## 2020-06-08 DIAGNOSIS — R531 Weakness: Secondary | ICD-10-CM | POA: Insufficient documentation

## 2020-06-08 DIAGNOSIS — K449 Diaphragmatic hernia without obstruction or gangrene: Secondary | ICD-10-CM | POA: Insufficient documentation

## 2020-06-08 DIAGNOSIS — G893 Neoplasm related pain (acute) (chronic): Secondary | ICD-10-CM | POA: Insufficient documentation

## 2020-06-08 DIAGNOSIS — J90 Pleural effusion, not elsewhere classified: Secondary | ICD-10-CM | POA: Insufficient documentation

## 2020-06-08 DIAGNOSIS — C7951 Secondary malignant neoplasm of bone: Secondary | ICD-10-CM | POA: Insufficient documentation

## 2020-06-08 DIAGNOSIS — F1021 Alcohol dependence, in remission: Secondary | ICD-10-CM | POA: Insufficient documentation

## 2020-06-08 DIAGNOSIS — Z79899 Other long term (current) drug therapy: Secondary | ICD-10-CM | POA: Insufficient documentation

## 2020-06-08 DIAGNOSIS — Z5112 Encounter for antineoplastic immunotherapy: Secondary | ICD-10-CM | POA: Insufficient documentation

## 2020-06-08 LAB — CBC WITH DIFFERENTIAL (CANCER CENTER ONLY)
Abs Immature Granulocytes: 0.05 10*3/uL (ref 0.00–0.07)
Basophils Absolute: 0 10*3/uL (ref 0.0–0.1)
Basophils Relative: 1 %
Eosinophils Absolute: 0.1 10*3/uL (ref 0.0–0.5)
Eosinophils Relative: 3 %
HCT: 41.6 % (ref 39.0–52.0)
Hemoglobin: 14.4 g/dL (ref 13.0–17.0)
Immature Granulocytes: 1 %
Lymphocytes Relative: 19 %
Lymphs Abs: 0.7 10*3/uL (ref 0.7–4.0)
MCH: 32.4 pg (ref 26.0–34.0)
MCHC: 34.6 g/dL (ref 30.0–36.0)
MCV: 93.7 fL (ref 80.0–100.0)
Monocytes Absolute: 0.6 10*3/uL (ref 0.1–1.0)
Monocytes Relative: 16 %
Neutro Abs: 2.1 10*3/uL (ref 1.7–7.7)
Neutrophils Relative %: 60 %
Platelet Count: 174 10*3/uL (ref 150–400)
RBC: 4.44 MIL/uL (ref 4.22–5.81)
RDW: 11.9 % (ref 11.5–15.5)
WBC Count: 3.5 10*3/uL — ABNORMAL LOW (ref 4.0–10.5)
nRBC: 0 % (ref 0.0–0.2)

## 2020-06-08 LAB — CMP (CANCER CENTER ONLY)
ALT: 8 U/L (ref 0–44)
AST: 22 U/L (ref 15–41)
Albumin: 3.7 g/dL (ref 3.5–5.0)
Alkaline Phosphatase: 76 U/L (ref 38–126)
Anion gap: 12 (ref 5–15)
BUN: <4 mg/dL — ABNORMAL LOW (ref 6–20)
CO2: 22 mmol/L (ref 22–32)
Calcium: 9.3 mg/dL (ref 8.9–10.3)
Chloride: 105 mmol/L (ref 98–111)
Creatinine: 0.68 mg/dL (ref 0.61–1.24)
GFR, Estimated: >60 mL/min (ref >60)
Glucose, Bld: 113 mg/dL — ABNORMAL HIGH (ref 70–99)
Potassium: 3.7 mmol/L (ref 3.5–5.1)
Sodium: 139 mmol/L (ref 135–145)
Total Bilirubin: 0.5 mg/dL (ref 0.3–1.2)
Total Protein: 7.4 g/dL (ref 6.5–8.1)

## 2020-06-08 LAB — TSH: TSH: 2.914 u[IU]/mL (ref 0.320–4.118)

## 2020-06-08 MED ORDER — SODIUM CHLORIDE 0.9 % IV SOLN
Freq: Once | INTRAVENOUS | Status: AC
Start: 2020-06-08 — End: 2020-06-08
  Filled 2020-06-08: qty 250

## 2020-06-08 MED ORDER — HYDROCODONE-ACETAMINOPHEN 7.5-325 MG/15ML PO SOLN
10.0000 mL | Freq: Four times a day (QID) | ORAL | 0 refills | Status: DC | PRN
  Filled 2020-06-08: qty 473, 12d supply, fill #0

## 2020-06-08 MED ORDER — PROCHLORPERAZINE MALEATE 10 MG PO TABS
10.0000 mg | ORAL_TABLET | Freq: Four times a day (QID) | ORAL | 2 refills | Status: DC | PRN
  Filled 2020-06-08: qty 60, 15d supply, fill #0

## 2020-06-08 MED ORDER — SODIUM CHLORIDE 0.9 % IV SOLN
200.0000 mg | Freq: Once | INTRAVENOUS | Status: AC
  Administered 2020-06-08: 200 mg via INTRAVENOUS
  Filled 2020-06-08: qty 8

## 2020-06-08 MED ORDER — MORPHINE SULFATE ER 30 MG PO TBCR
30.0000 mg | EXTENDED_RELEASE_TABLET | Freq: Three times a day (TID) | ORAL | 0 refills | Status: DC
  Filled 2020-06-08: qty 90, 30d supply, fill #0

## 2020-06-08 NOTE — Addendum Note (Signed)
Addended by: [name] on: 06/08/2020 07:47 PM   Modules accepted: Orders, Level of Service

## 2020-06-08 NOTE — Patient Instructions (Signed)
[facility] MEDICAL ONCOLOGY  Discharge Instructions: ?Thank you for choosing [facility] to provide your oncology and hematology care.  ? ?If you have a lab appointment with the Cancer Center, please go directly to the Cancer Center and check in at the registration area. ?  ?Wear comfortable clothing and clothing appropriate for easy access to any Portacath or PICC line.  ? ?We strive to give you quality time with your provider. You may need to reschedule your appointment if you arrive late (15 or more minutes).  Arriving late affects you and other patients whose appointments are after yours.  Also, if you miss three or more appointments without notifying the office, you may be dismissed from the clinic at the provider?s discretion.    ?  ?For prescription refill requests, have your pharmacy contact our office and allow 72 hours for refills to be completed.   ? ?Today you received the following chemotherapy and/or immunotherapy agents: Keytruda ?  ?To help prevent nausea and vomiting after your treatment, we encourage you to take your nausea medication as directed. ? ?BELOW ARE SYMPTOMS THAT SHOULD BE REPORTED IMMEDIATELY: ?*FEVER GREATER THAN 100.4 F (38 ?C) OR HIGHER ?*CHILLS OR SWEATING ?*NAUSEA AND VOMITING THAT IS NOT CONTROLLED WITH YOUR NAUSEA MEDICATION ?*UNUSUAL SHORTNESS OF BREATH ?*UNUSUAL BRUISING OR BLEEDING ?*URINARY PROBLEMS (pain or burning when urinating, or frequent urination) ?*BOWEL PROBLEMS (unusual diarrhea, constipation, pain near the anus) ?TENDERNESS IN MOUTH AND THROAT WITH OR WITHOUT PRESENCE OF ULCERS (sore throat, sores in mouth, or a toothache) ?UNUSUAL RASH, SWELLING OR PAIN  ?UNUSUAL VAGINAL DISCHARGE OR ITCHING  ? ?Items with * indicate a potential emergency and should be followed up as soon as possible or go to the Emergency Department if any problems should occur. ? ?Please show the CHEMOTHERAPY ALERT CARD or IMMUNOTHERAPY ALERT CARD at check-in to the  Emergency Department and triage nurse. ? ?Should you have questions after your visit or need to cancel or reschedule your appointment, please contact [facility] MEDICAL ONCOLOGY  Dept: [phone]  and follow the prompts.  Office hours are 8:00 a.m. to 4:30 p.m. Monday - Friday. Please note that voicemails left after 4:00 p.m. may not be returned until the following business day.  We are closed weekends and major holidays. You have access to a nurse at all times for urgent questions. Please call the main number to the clinic Dept: [phone] and follow the prompts. ? ? ?For any non-urgent questions, you may also contact your provider using MyChart. We now offer e-Visits for anyone 18 and older to request care online for non-urgent symptoms. For details visit [email or web address]. ?  ?Also download the MyChart app! Go to the app store, search "MyChart", open the app, select [facility], and log in with your MyChart username and password. ? ?Due to Covid, a mask is required upon entering the hospital/clinic. If you do not have a mask, one will be given to you upon arrival. For doctor visits, patients may have 1 support person aged 18 or older with them. For treatment visits, patients cannot have anyone with them due to current Covid guidelines and our immunocompromised population.  ? ?

## 2020-06-08 NOTE — Progress Notes (Signed)
[place] CSW Progress Notes  Met w patient in infusion, spoke via [place] interpreter Dayna ([id number])/[employer] interpreter.  Patient does not wish to apply for Social Security disability or other government benefits.  He believes he is ineligible.  He was enrolled in [facility] and given first [place] manager.  Wife is only working member of household - she is also heavily involved in his care during his treatments.  There are financial burdens as a result.  He does not believe he has been offered the [place] will message Red Christians, Estate manager/land agent, and ask for her assistance.  Referred to [facility] [employer] with his permission - advised they may have other avenues of support available for him and his family.  He is agreeable to this referral.  Will schedule follow up visit at upcoming [facility] visit.  He has my card and was encouraged to contact me as needed.    Edwyna Shell, LCSW Clinical Social Worker Phone:  [phone]

## 2020-06-08 NOTE — Progress Notes (Signed)
Per Dr Burr Medico ok to treat with HR 105

## 2020-06-13 ENCOUNTER — Telehealth: Payer: Self-pay | Admitting: Hematology

## 2020-06-13 ENCOUNTER — Other Ambulatory Visit (HOSPITAL_COMMUNITY): Payer: Self-pay

## 2020-06-13 NOTE — Telephone Encounter (Signed)
Scheduled follow-up appointments per 5/13 los. Patient is aware. 

## 2020-06-15 ENCOUNTER — Other Ambulatory Visit (HOSPITAL_COMMUNITY): Payer: Self-pay

## 2020-06-15 ENCOUNTER — Other Ambulatory Visit: Payer: Self-pay | Admitting: Hematology

## 2020-06-15 ENCOUNTER — Encounter: Payer: Self-pay | Admitting: Hematology

## 2020-06-15 MED ORDER — HYDROCODONE-ACETAMINOPHEN 7.5-325 MG/15ML PO SOLN
10.0000 mL | Freq: Four times a day (QID) | ORAL | 0 refills | Status: DC | PRN
  Filled 2020-06-15 – 2020-06-20 (×2): qty 946, 24d supply, fill #0

## 2020-06-15 NOTE — Progress Notes (Signed)
Received message from social worker on 06/08/20 regarding offering patient Jimmy Hawkins.  Replied back on 06/08/20, patient has an active grant and has been using at pharmacy for medications.

## 2020-06-18 ENCOUNTER — Other Ambulatory Visit (HOSPITAL_COMMUNITY): Payer: Self-pay

## 2020-06-20 ENCOUNTER — Encounter: Payer: Self-pay | Admitting: Hematology

## 2020-06-20 ENCOUNTER — Other Ambulatory Visit (HOSPITAL_COMMUNITY): Payer: Self-pay

## 2020-06-21 ENCOUNTER — Other Ambulatory Visit (HOSPITAL_COMMUNITY): Payer: Self-pay

## 2020-06-27 NOTE — Progress Notes (Signed)
[place]   Telephone:([phone]) [phone] Fax:(336) [phone]   Clinic Follow up Note   Patient Care Team: [name], MD as PCP - General (Hematology) [name], MD as Consulting Physician (Oncology)  Date of Service:  06/29/2020  CHIEF COMPLAINT: F/u ofMiddleEsophageal Squamous CellCarcinoma  SUMMARY OF ONCOLOGIC HISTORY: Oncology History Overview Note  Cancer Staging Malignant neoplasm of middle third of esophagus ([place]) Staging form: Esophagus - Squamous Cell Carcinoma, AJCC 8th Edition - Clinical: Stage Unknown (cTX, cN1, cM0) - Signed by [name], MD on 08/18/2019    Malignant neoplasm of middle third of esophagus [facility])   Initial Diagnosis   Malignant neoplasm of middle third of esophagus ([place])   08/11/2019 Imaging   CT CAP W contrast 08/11/19 IMPRESSION: 1. Mid to lower esophageal mass along an 8 cm segment, large endoluminal component, strongly favoring esophageal malignancy. Borderline enlarged AP window lymph node. No findings of distant metastatic disease. 2. Trace bilateral pleural effusions. 3. Diffuse hepatic steatosis. 4. Cholelithiasis. 5. Fatty spermatic cords likely due to small bilateral indirect inguinal hernias.   08/11/2019 Procedure   EGD by Dr Carlean Purl  IMPRESSION - Partially obstructing, likely malignant esophageal tumor was found in the upper third of the esophagus and in the middle third of the esophagus. Biopsied. Very large and long - difficult but able to advance scope by this lesion - await CT for better length estimate - Normal stomach. - Normal examined duodenum.   08/11/2019 Initial Biopsy   FINAL MICROSCOPIC DIAGNOSIS:   A. ESOPHAGUS, UPPER, BIOPSY:  - Squamous cell carcinoma.    08/11/2019 Genetic Testing   PD-L1 Expression 90%   08/18/2019 Cancer Staging   Staging form: Esophagus - Squamous Cell Carcinoma, AJCC 8th Edition - Clinical: Stage Unknown (cTX, cN1, cM0) - Signed by [name], MD on 08/18/2019   08/29/2019 -  10/04/2019 Chemotherapy   Concurrent chemoradiation with weekly CT for 6 weeks starting 08/29/19-10/04/19    08/29/2019 - 10/06/2019 Radiation Therapy   Concurrent chemoradiation with Dr Lisbeth Renshaw starting 08/29/19-10/06/19   11/11/2019 PET scan   IMPRESSION: 1. Again seen is a long segment of FDG avid wall thickening of the distal half of the thoracic esophagus compatible with known esophageal neoplasm. 2. No signs of FDG avid nodal or distant metastatic disease.     11/28/2019 Procedure   EUS by Dr Rush Landmark IMPRESSION EGD Impression: - No gross lesions in esophagus proximally. - Previously noted esophageal cancer is not present. However, in its place and extending distal to this is likely LA Grade D chemotherapy/radiation esophagitis with bleeding. Biopsied after the EUS was complete to evaluate for persistent disease or not. - Z-line regular, 37 cm from the incisors. - 3 cm hiatal hernia. - Erythematous mucosa in the stomach. No other gross lesions in the stomach. Biopsied. - No gross lesions in the duodenal bulb, in the first portion of the duodenum and in the second portion of the duodenum. EUS Impression: - Wall thickening was seen in the thoracic esophagus into the gastroesophageal junction. The thickening appeared to primarily be within the luminal interface/superficial mucosa (Layer 1) and deep mucosa (Layer 2). The previously noted esophageal cancer has been replaced by what appears to be radiation/chemotherapy induced changes. With the limitations of having to use the mini-probe EUS, I could not visualize persistent significant disease other than the entire distal area of the esophagus having likely changes noted as above. - No malignant-appearing lymph nodes were visualized in the middle paraesophageal mediastinum (level 61M), lower  paraesophageal mediastinum (level 8L), diaphragmatic region (level 15) and paracardial region (level 16).      FINAL MICROSCOPIC DIAGNOSIS:   A.  STOMACH, BIOPSY:  - Mildly active chronic gastritis.  - Warthin-Starry negative for Helicobacter pylori.  - No intestinal metaplasia, dysplasia or carcinoma.   B. ESOPHAGUS, BIOPSY:  - Inflamed granulation tissue and exudate consistent with ulcer.  - No malignancy identified.    12/20/2019 - 02/2020 Chemotherapy   CAPOX q3weeks with Xeloda 1592m BID 2 weeks on/1 week off starting 12/20/19-02/2020   03/21/2020 Imaging   [facility]--Circumferential thickening of the lower third of the esophagus which corresponds with known esophageal malignancy. No evidence of metastatic disease within the chest.  --For findings below the diaphragm, please see concurrent separately reported CT of the abdomen and pelvis.  --Expansile lytic mass with associated soft tissue component centered in the left sacral ala and abutting the left S1 and S2 nerve roots, left internal iliac vasculature, and left pyriformis muscle with no other metastatic disease within the abdomen or pelvis.   --Diffuse thickening of the distal esophagus compatible with known esophageal malignancy.   --Cholelithiasis with nonspecific gallbladder distention. Clinical correlation is recommended. If there is clinical concern for cholecystitis, further evaluation with ultrasound can be obtained.   03/21/2020 Imaging   CT Lumbar Spine  Expansile lytic lesion centered in the left sacral ala with no other evidence of metastatic disease in the lumbar spine.   03/26/2020 - 04/10/2020 Radiation Therapy   Palliative Radiation to Sacrum with Dr MLisbeth Renshaw2/28/22-3/15/22.    04/11/2020 Progression   PET  IMPRESSION: 1. New hypermetabolic expansile 6.1 cm upper left sacral lytic bone metastasis, centrally necrotic. 2. New hypermetabolic left level 1b and left level 2 neck lymph nodes, indeterminate for reactive versus metastatic nodes. 3. Persistent hypermetabolism associated with a short segment of mid to lower thoracic esophagus with associated  circumferential esophageal wall thickening, similar in metabolism and decreased in wall thickness since 11/11/2019 PET-CT, nonspecific, favor post treatment change, with residual neoplasm not excluded. 4. No additional sites of hypermetabolic metastatic disease. 5.  Chronic findings include: Cholelithiasis.   04/27/2020 -  Chemotherapy   First-line with immunotherapy Keytruda q3weeks starting 04/27/20       06/29/2020 -  Chemotherapy   Weekly carbo AUC 1.5 and Taxol 693mm2 on day 1, 8 every 21 days added to first-line Keytruda from C4 on 06/29/20.        CURRENT THERAPY:  First-line Keytruda q3weeks starting4/1/22, plan to add weekly carbo AUC 1.5 and Taxol 6093m2 from cycle 4 (06/29/20) on day 1, 8 every 21 days   INTERVAL HISTORY:  Jimmy Hawkins here for a follow up. He was last seen by me 06/08/20. He presents to the clinic with Spanish interpretor. I reviewed his medication list with him. He is taking MS Contin BID but not sure about ml taken. He notes his pain is currently 5/10 which is tolerable. His pain spikes in the morning. He notes he is able to sleep through the night. He ambulates with crutches outside and uses walker at home. At home he is able to do ADLs like showering, but needs help with food preporation. He eats soft food diet given dysphagia and mild throat pain. His weight is stable. He notes he has not been drinking alcohol recently. He notes he uses antimeric before meal so he will not have nausea. He denies cough or SOB.    REVIEW OF SYSTEMS:  Constitutional: Denies fevers, chills or abnormal weight loss Eyes: Denies blurriness of vision Ears, nose, mouth, throat, and face: Denies mucositis or sore throat (+) Dysphagia  Respiratory: Denies cough, dyspnea or wheezes Cardiovascular: Denies palpitation, chest discomfort or lower extremity swelling Gastrointestinal:  Denies nausea, heartburn or change in bowel habits Skin: Denies abnormal skin  rashes Lymphatics: Denies new lymphadenopathy or easy bruising Neurological:Denies numbness, tingling or new weaknesses Behavioral/Psych: Mood is stable, no new changes  All other systems were reviewed with the patient and are negative.  MEDICAL HISTORY:  Past Medical History:  Diagnosis Date  . GERD (gastroesophageal reflux disease)   . Hematemesis with nausea 08/10/2019    SURGICAL HISTORY: Past Surgical History:  Procedure Laterality Date  . BIOPSY  08/11/2019   Procedure: BIOPSY;  Surgeon: [name], MD;  Location: [facility] ENDOSCOPY;  Service: Endoscopy;;  . BIOPSY  11/28/2019   Procedure: BIOPSY;  Surgeon: [name], MD;  Location: [place];  Service: Gastroenterology;;  . ESOPHAGOGASTRODUODENOSCOPY  08/11/2019  . ESOPHAGOGASTRODUODENOSCOPY (EGD) WITH PROPOFOL N/A 08/11/2019   Procedure: ESOPHAGOGASTRODUODENOSCOPY (EGD) WITH PROPOFOL;  Surgeon: [name], MD;  Location: [place];  Service: Endoscopy;  Laterality: N/A;  . ESOPHAGOGASTRODUODENOSCOPY (EGD) WITH PROPOFOL N/A 11/28/2019   Procedure: ESOPHAGOGASTRODUODENOSCOPY (EGD) WITH PROPOFOL;  Surgeon: Rush Landmark Telford Nab., MD;  Location: [place];  Service: Gastroenterology;  Laterality: N/A;  . EUS N/A 11/28/2019   Procedure: UPPER ENDOSCOPIC ULTRASOUND (EUS) RADIAL;  Surgeon: Rush Landmark Telford Nab., MD;  Location: [place];  Service: Gastroenterology;  Laterality: N/A;    I have reviewed the social history and family history with the patient and they are unchanged from previous note.  ALLERGIES:  has No Known Allergies.  MEDICATIONS:  Current Outpatient Medications  Medication Sig Dispense Refill  . diphenhydrAMINE-APAP, sleep, (TYLENOL PM EXTRA STRENGTH) 50-1000 MG/30ML LIQD Take 15 mLs by mouth at bedtime as needed (sleep).    . famotidine (PEPCID) 20 MG tablet TOME 1 PASTILLA POR VIA ORAL DOS VECES AL DIA 60 tablet 3  . Ferrous Sulfate (IRON) 325 (65 Fe) MG TABS Take 1 tablet (325 mg  total) by mouth 2 (two) times daily. 60 tablet 3  . ferrous sulfate 325 (65 FE) MG tablet TAKE 1 TABLET BY MOUTH 2 TIMES DAILY. 60 tablet 3  . gabapentin (NEURONTIN) 100 MG capsule Take 1 capsule by mouth every night 30 capsule 3  . HYDROcodone-acetaminophen (HYCET) 7.5-325 mg/15 ml solution Take 10 mLs by mouth every 6 (six) hours as needed for moderate pain. 946 mL 0  . methylPREDNISolone (MEDROL DOSEPAK) 4 MG TBPK tablet TOME TODAS LAS 6 PASTILLAS POR VIA ORAL EL PRIMER DIA, LUEGO DISMINUYA 1 PASTILLA CADA DIA (6-5-4-3-2-1) 21 each 0  . mirtazapine (REMERON) 7.5 MG tablet TOME 1 PASTILLA POR VIA ORAL A LA HORA DE ACOSTARSE 30 tablet 0  . morphine (MS CONTIN) 30 MG 12 hr tablet Take 1 tablet (30 mg total) by mouth every 8 (eight) hours. 90 tablet 0  . prochlorperazine (COMPAZINE) 10 MG tablet Take 1 tablet (10 mg total) by mouth every 6 (six) hours as needed for nausea or vomiting. 60 tablet 2  . sucralfate (CARAFATE) 1 g tablet Take 1 tablet (1 g total) by mouth 4 (four) times daily. Dissolve each tablet in 15 cc water before use. 120 tablet 2   No current facility-administered medications for this visit.    PHYSICAL EXAMINATION: ECOG PERFORMANCE STATUS: 3 - Symptomatic, >50% confined to bed  Vitals:   06/29/20  1016  BP: (!) 140/96  Pulse: (!) 124  Resp: 17  Temp: 98.3 F (36.8 C)  SpO2: 98%   Filed Weights   06/29/20 1016  Weight: 161 lb 14.4 oz (73.4 kg)    GENERAL:alert, no distress and comfortable SKIN: skin color, texture, turgor are normal, no rashes or significant lesions EYES: normal, Conjunctiva are pink and non-injected, sclera clear  NECK: supple, thyroid normal size, non-tender, without nodularity LYMPH:  no palpable lymphadenopathy in the cervical, axillary  LUNGS: clear to auscultation and percussion with normal breathing effort HEART: regular rate & rhythm and no murmurs and no lower extremity edema ABDOMEN:abdomen soft, non-tender and normal bowel  sounds Musculoskeletal:no cyanosis of digits and no clubbing  NEURO: alert & oriented x 3 with fluent speech, no focal motor/sensory deficits  LABORATORY DATA:  I have reviewed the data as listed CBC Latest Ref Rng & Units 06/29/2020 06/08/2020 05/18/2020  WBC 4.0 - 10.5 K/uL 3.5(L) 3.5(L) 2.7(L)  Hemoglobin 13.0 - 17.0 g/dL 15.4 14.4 14.4  Hematocrit 39.0 - 52.0 % 43.0 41.6 42.4  Platelets 150 - 400 K/uL 192 174 175     CMP Latest Ref Rng & Units 06/29/2020 06/08/2020 05/18/2020  Glucose 70 - 99 mg/dL 105(H) 113(H) 123(H)  BUN 6 - 20 mg/dL 5(L) <4(L) <4(L)  Creatinine 0.61 - 1.24 mg/dL 0.62 0.68 0.63  Sodium 135 - 145 mmol/L 138 139 139  Potassium 3.5 - 5.1 mmol/L 3.8 3.7 3.6  Chloride 98 - 111 mmol/L 103 105 106  CO2 22 - 32 mmol/L _0 Calcium 8.9 - 10.3 mg/dL 9.6 9.3 9.2  Total Protein 6.5 - 8.1 g/dL 7.6 7.4 7.3  Total Bilirubin 0.3 - 1.2 mg/dL 0.5 0.5 0.8  Alkaline Phos 38 - 126 U/L 101 76 71  AST 15 - 41 U/L 35 22 22  ALT 0 - 44 U/L _1 RADIOGRAPHIC STUDIES: I have personally reviewed the radiological images as listed and agreed with the findings in the report. No results found.   ASSESSMENT & PLAN:  Jimmy Hawkins is a 45 y.o. 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  -He completed 6 weeks of concurrent chemoRT withweekly Carboplatin and Taxol 08/29/19-10/06/19 -Repeat PET scan October 2021 showedhypermetabolic wall thickening of the distal half of the thoracic esophageus,no notable distant metastasis. Dr. Kipp Brood has reviewed the PET scan, and feels that his tumor invadestracheais it'snot resectable. He will f/u with Dr Kipp Brood on 01/06/20. -He completedconsolidation  chemotherapy with CAPOX q3weeks withXeloda 1547m BID 2 weeks on/1 week offstarting 12/20/19-02/2020. -His PET from 04/11/20 showed6.1cmbone metastasis in sacrum and indeterminate cervical LN. -He completed palliativeRadiationto sacrumwith Dr MLisbeth Renshaw2/28/22-3/15/22. -He has started First-line single agent Keytruda 04/27/20. Due to his slow improvement and squamous cell histology, I will add chemo with carboplatin and Taxol 2 weeks on/1 week off from C4 on 06/29/20.  -His pain is mostly controlled on pain medication twice a day. His weight is stable on soft food diet due to dysphagia. He has recently stopped drinking alcohol. He is prepared to start low dose chemo. Labs reviewed, WBC 3.5, BG 105, BUN 5. Overall adequate to proceed with C4D1 Keytruda, and adding Carboplatin and Taxol today.  -Will proceed with Day 8 Carbo and Taxol next week -  Plan to scan him before next cycle. F/u in 3 weeks.   2. Sacral/back pain, LE weakness, secondary to bone metastasis  -He was seen to have bone mets on 04/11/20 PET scan.  -He completed palliativeRadiationto sacrumwith Dr Lisbeth Renshaw 03/26/20-04/10/20. He was given short course dexa 37m. -He ison MS Contin and hydrocodone as needed -Continue to f/u with palliative medicine Dr. GHilma Favors 3.H/oHeavy alcohol user -He quitdrinkingsince cancer diagnosis, but started again. Due to weakness, he is not able to drink as much since 04/13/20 and has stopped overall (06/29/20)  4. Social and FAcupuncturist-Patient does not have insurance. -He has met withfinancial advocate Shauna and SLoreli Dollarhe has grant assistance with medications. -Will see if he can get drug replacement for Keytruda.  -Patient, family and Interpretor notes difficulty with communication. They will set up Mychart for more direct line. -He will continue to f/u with SW for support    PLAN: -Labs reviewed, adequate to proceed with Keytruda, and starting weekly carboplatin and Taxol today   -lab, Carboplatin and Taxol next week  -Lab, f/u and chemo in 3 weeks with PET scan a few days before    No problem-specific Assessment & Plan notes found for this encounter.   Orders Placed This Encounter  Procedures  . NM PET Image Restag (PS) Skull Base To Thigh    Standing Status:   Future    Standing Expiration Date:   06/29/2021    Order Specific Question:   If indicated for the ordered procedure, I authorize the administration of a radiopharmaceutical per Radiology protocol    Answer:   Yes    Order Specific Question:   [facility]?    [facility]  All questions were answered. The patient knows to call the clinic with any problems, questions or concerns. No barriers to learning was detected. The total time spent in the appointment was 30 minutes.     YTruitt Merle MD 06/29/2020   I, AJoslyn Devon am acting as scribe for YTruitt Merle MD.   I have reviewed the above documentation for accuracy and completeness, and I agree with the above. '

## 2020-06-29 ENCOUNTER — Inpatient Hospital Stay: Payer: Self-pay

## 2020-06-29 ENCOUNTER — Inpatient Hospital Stay (HOSPITAL_BASED_OUTPATIENT_CLINIC_OR_DEPARTMENT_OTHER): Payer: Self-pay | Admitting: Hematology

## 2020-06-29 ENCOUNTER — Other Ambulatory Visit: Payer: Self-pay

## 2020-06-29 ENCOUNTER — Inpatient Hospital Stay: Payer: Self-pay | Attending: Hematology

## 2020-06-29 ENCOUNTER — Encounter: Payer: Self-pay | Admitting: Hematology

## 2020-06-29 VITALS — BP 140/96 | HR 124 | Temp 98.3°F | Resp 17 | Ht 66.0 in | Wt 161.9 lb

## 2020-06-29 VITALS — BP 106/73 | HR 77 | Temp 98.2°F | Resp 17

## 2020-06-29 DIAGNOSIS — Z5112 Encounter for antineoplastic immunotherapy: Secondary | ICD-10-CM | POA: Insufficient documentation

## 2020-06-29 DIAGNOSIS — G893 Neoplasm related pain (acute) (chronic): Secondary | ICD-10-CM | POA: Insufficient documentation

## 2020-06-29 DIAGNOSIS — Z79899 Other long term (current) drug therapy: Secondary | ICD-10-CM | POA: Insufficient documentation

## 2020-06-29 DIAGNOSIS — Z5111 Encounter for antineoplastic chemotherapy: Secondary | ICD-10-CM | POA: Insufficient documentation

## 2020-06-29 DIAGNOSIS — C154 Malignant neoplasm of middle third of esophagus: Secondary | ICD-10-CM | POA: Insufficient documentation

## 2020-06-29 DIAGNOSIS — R531 Weakness: Secondary | ICD-10-CM | POA: Insufficient documentation

## 2020-06-29 DIAGNOSIS — C7951 Secondary malignant neoplasm of bone: Secondary | ICD-10-CM

## 2020-06-29 DIAGNOSIS — F1021 Alcohol dependence, in remission: Secondary | ICD-10-CM | POA: Insufficient documentation

## 2020-06-29 DIAGNOSIS — Z923 Personal history of irradiation: Secondary | ICD-10-CM | POA: Insufficient documentation

## 2020-06-29 DIAGNOSIS — M25552 Pain in left hip: Secondary | ICD-10-CM

## 2020-06-29 LAB — CBC WITH DIFFERENTIAL (CANCER CENTER ONLY)
Abs Immature Granulocytes: 0.03 10*3/uL (ref 0.00–0.07)
Basophils Absolute: 0 10*3/uL (ref 0.0–0.1)
Basophils Relative: 1 %
Eosinophils Absolute: 0.1 10*3/uL (ref 0.0–0.5)
Eosinophils Relative: 4 %
HCT: 43 % (ref 39.0–52.0)
Hemoglobin: 15.4 g/dL (ref 13.0–17.0)
Immature Granulocytes: 1 %
Lymphocytes Relative: 18 %
Lymphs Abs: 0.6 10*3/uL — ABNORMAL LOW (ref 0.7–4.0)
MCH: 32.8 pg (ref 26.0–34.0)
MCHC: 35.8 g/dL (ref 30.0–36.0)
MCV: 91.5 fL (ref 80.0–100.0)
Monocytes Absolute: 0.6 10*3/uL (ref 0.1–1.0)
Monocytes Relative: 16 %
Neutro Abs: 2.1 10*3/uL (ref 1.7–7.7)
Neutrophils Relative %: 60 %
Platelet Count: 192 10*3/uL (ref 150–400)
RBC: 4.7 MIL/uL (ref 4.22–5.81)
RDW: 11.5 % (ref 11.5–15.5)
WBC Count: 3.5 10*3/uL — ABNORMAL LOW (ref 4.0–10.5)
nRBC: 0 % (ref 0.0–0.2)

## 2020-06-29 LAB — CMP (CANCER CENTER ONLY)
ALT: 15 U/L (ref 0–44)
AST: 35 U/L (ref 15–41)
Albumin: 3.6 g/dL (ref 3.5–5.0)
Alkaline Phosphatase: 101 U/L (ref 38–126)
Anion gap: 12 (ref 5–15)
BUN: 5 mg/dL — ABNORMAL LOW (ref 6–20)
CO2: 23 mmol/L (ref 22–32)
Calcium: 9.6 mg/dL (ref 8.9–10.3)
Chloride: 103 mmol/L (ref 98–111)
Creatinine: 0.62 mg/dL (ref 0.61–1.24)
GFR, Estimated: >60 mL/min (ref >60)
Glucose, Bld: 105 mg/dL — ABNORMAL HIGH (ref 70–99)
Potassium: 3.8 mmol/L (ref 3.5–5.1)
Sodium: 138 mmol/L (ref 135–145)
Total Bilirubin: 0.5 mg/dL (ref 0.3–1.2)
Total Protein: 7.6 g/dL (ref 6.5–8.1)

## 2020-06-29 MED ORDER — SODIUM CHLORIDE 0.9 % IV SOLN
60.0000 mg/m2 | Freq: Once | INTRAVENOUS | Status: AC
  Administered 2020-06-29: 114 mg via INTRAVENOUS
  Filled 2020-06-29: qty 19

## 2020-06-29 MED ORDER — FAMOTIDINE 20 MG IN NS 100 ML IVPB
INTRAVENOUS | Status: AC
  Filled 2020-06-29: qty 100

## 2020-06-29 MED ORDER — PALONOSETRON HCL INJECTION 0.25 MG/5ML
INTRAVENOUS | Status: AC
  Filled 2020-06-29: qty 5

## 2020-06-29 MED ORDER — PALONOSETRON HCL INJECTION 0.25 MG/5ML
0.2500 mg | Freq: Once | INTRAVENOUS | Status: AC
  Administered 2020-06-29: 0.25 mg via INTRAVENOUS

## 2020-06-29 MED ORDER — SODIUM CHLORIDE 0.9 % IV SOLN
200.0000 mg | Freq: Once | INTRAVENOUS | Status: AC
  Administered 2020-06-29: 200 mg via INTRAVENOUS
  Filled 2020-06-29: qty 8

## 2020-06-29 MED ORDER — DIPHENHYDRAMINE HCL 50 MG/ML IJ SOLN
INTRAMUSCULAR | Status: AC
  Filled 2020-06-29: qty 1

## 2020-06-29 MED ORDER — DIPHENHYDRAMINE HCL 50 MG/ML IJ SOLN
50.0000 mg | Freq: Once | INTRAMUSCULAR | Status: AC
  Administered 2020-06-29: 50 mg via INTRAVENOUS

## 2020-06-29 MED ORDER — SODIUM CHLORIDE 0.9 % IV SOLN
Freq: Once | INTRAVENOUS | Status: AC
Start: 2020-06-29 — End: 2020-06-29
  Filled 2020-06-29: qty 250

## 2020-06-29 MED ORDER — SODIUM CHLORIDE 0.9 % IV SOLN
222.9000 mg | Freq: Once | INTRAVENOUS | Status: AC
  Administered 2020-06-29: 220 mg via INTRAVENOUS
  Filled 2020-06-29: qty 22

## 2020-06-29 MED ORDER — FAMOTIDINE 20 MG IN NS 100 ML IVPB
20.0000 mg | Freq: Once | INTRAVENOUS | Status: AC
  Administered 2020-06-29: 20 mg via INTRAVENOUS

## 2020-06-29 NOTE — Progress Notes (Signed)
Pt's HR 110-116, ok to treat per Dr. Burr Medico

## 2020-06-29 NOTE — Progress Notes (Signed)
Per dr Burr Medico, ok to treat with HR 112-119.

## 2020-06-29 NOTE — Patient Instructions (Signed)
[facility]  Discharge Instructions: Thank you for choosing [facility] to provide your oncology and hematology care.   If you have a lab appointment with the [facility], please go directly to the [facility] and check in at the registration area.   Wear comfortable clothing and clothing appropriate for easy access to any Portacath or PICC line.   We strive to give you quality time with your provider. You may need to reschedule your appointment if you arrive late (15 or more minutes).  Arriving late affects you and other patients whose appointments are after yours.  Also, if you miss three or more appointments without notifying the office, you may be dismissed from the clinic at the provider's discretion.      For prescription refill requests, have your pharmacy contact our office and allow 72 hours for refills to be completed.    Today you received the following chemotherapy and/or immunotherapy agents Keytruda, Taxol & Carboplatin    To help prevent nausea and vomiting after your treatment, we encourage you to take your nausea medication as directed.  BELOW ARE SYMPTOMS THAT SHOULD BE REPORTED IMMEDIATELY: . *FEVER GREATER THAN 100.4 F (38 C) OR HIGHER . *CHILLS OR SWEATING . *NAUSEA AND VOMITING THAT IS NOT CONTROLLED WITH YOUR NAUSEA MEDICATION . *UNUSUAL SHORTNESS OF BREATH . *UNUSUAL BRUISING OR BLEEDING . *URINARY PROBLEMS (pain or burning when urinating, or frequent urination) . *BOWEL PROBLEMS (unusual diarrhea, constipation, pain near the anus) . TENDERNESS IN MOUTH AND THROAT WITH OR WITHOUT PRESENCE OF ULCERS (sore throat, sores in mouth, or a toothache) . UNUSUAL RASH, SWELLING OR PAIN  . UNUSUAL VAGINAL DISCHARGE OR ITCHING   Items with * indicate a potential emergency and should be followed up as soon as possible or go to the Emergency Department if any problems should occur.  Please show the CHEMOTHERAPY ALERT CARD or  IMMUNOTHERAPY ALERT CARD at check-in to the Emergency Department and triage nurse.  Should you have questions after your visit or need to cancel or reschedule your appointment, please contact [facility]  Dept: [phone]  and follow the prompts.  Office hours are 8:00 a.m. to 4:30 p.m. Monday - Friday. Please note that voicemails left after 4:00 p.m. may not be returned until the following business day.  We are closed weekends and major holidays. You have access to a nurse at all times for urgent questions. Please call the main number to the clinic Dept: [phone] and follow the prompts.   For any non-urgent questions, you may also contact your provider using MyChart. We now offer e-Visits for anyone 61 and older to request care online for non-urgent symptoms. For details visit [email or web address].   Also download the MyChart app! Go to the app store, search "MyChart", open the app, select [facility], and log in with your MyChart username and password.  Due to Covid, a mask is required upon entering the hospital/clinic. If you do not have a mask, one will be given to you upon arrival. For doctor visits, patients may have 1 support person aged 33 or older with them. For treatment visits, patients cannot have anyone with them due to current Covid guidelines and our immunocompromised population.   Pembrolizumab injection Qu es este medicamento? El PEMBROLIZUMAB es un anticuerpo monoclonal. [place] para tratar ciertos tipos de cncer. Este medicamento puede ser utilizado para otros usos; si tiene alguna pregunta consulte con su proveedor de atencin  mdica o con su farmacutico. MARCAS COMUNES: Keytruda Qu le debo informar a mi profesional de la salud antes de tomar este medicamento? Necesitan saber si usted presenta alguno de los siguientes problemas o situaciones: enfermedades autoinmunes tales como enfermedad de Crohn, colitis ulcerativa o lupus ha tenido  o planea tener un trasplante alognico de Social research officer, government ([place] las clulas madre de Theatre manager persona) antecedentes de trasplante de rganos antecedentes de radiacin en el pecho problemas del sistema nervioso, tales como miastenia grave o sndrome de Curator una reaccin alrgica o inusual al pembrolizumab, a otros medicamentos, alimentos, colorantes o conservantes si est embarazada o buscando quedar embarazada si est amamantando a un beb Cmo debo utilizar este medicamento? Este medicamento se administra mediante infusin en una vena. Lo administra un profesional de Technical sales engineer en un hospital o en un entorno clnico. Se le entregar una Gua del medicamento (MedGuide, su nombre en ingls) especial antes de cada tratamiento. Asegrese de leer esta informacin cada vez cuidadosamente. Hable con su pediatra para informarse acerca del uso de este medicamento en nios. Aunque este medicamento se puede recetar a nios tan pequeos como de 6 meses de edad con ciertas afecciones, existen precauciones que deben tomarse. Sobredosis: Pngase en contacto inmediatamente con un centro toxicolgico o una sala de urgencia si usted cree que haya tomado demasiado medicamento. ATENCIN: [employer] es solo para usted. No comparta este medicamento con nadie. Qu sucede si me olvido de una dosis? Es importante no olvidar ninguna dosis. Informe a su mdico o a su profesional de la salud si no puede asistir a Photographer. Qu puede interactuar con este medicamento? No se han estudiado las interacciones. Puede ser que esta lista no menciona todas las posibles interacciones. Informe a su profesional de KB Home	[place] de [employer] productos a base de hierbas, medicamentos de [place] o suplementos nutritivos que est tomando. Si usted fuma, consume bebidas alcohlicas o si utiliza drogas ilegales, indqueselo tambin a su profesional de KB Home	[place]. Algunas sustancias pueden interactuar con su medicamento. A qu debo estar  atento al usar Coca-Cola? Se supervisar su estado de salud atentamente mientras reciba este medicamento. Usted podra necesitar realizarse [employer] de sangre mientras est usando [place]. No debe quedar embarazada mientras est usando este medicamento o por 4 meses despus de dejar de usarlo. Las mujeres deben informar a su mdico si estn buscando quedar embarazadas o si creen que podran estar embarazadas. Existe la posibilidad de efectos secundarios graves en un beb sin nacer. Para obtener ms informacin, hable con su profesional de la salud o su farmacutico. No debe amamantar a un beb mientras est usando este medicamento o durante 4 meses despus de la ltima dosis. Qu efectos secundarios puedo tener al [employer] este medicamento? Efectos secundarios que debe informar a su mdico o a Barrister's clerk de la salud tan pronto como sea posible: Chief of Staff, tales como erupcin cutnea, comezn/picazn o urticaria, e hinchazn de la cara, los labios o la lengua con sangre o de color negro y aspecto alquitranado problemas para respirar cambios en la visin dolor en el pecho escalofros confusin estreimiento tos diarrea mareo, sensacin de desmayo o aturdimiento frecuencia cardiaca rpida o irregular fiebre enrojecimiento dolor en las articulaciones recuentos sanguneos bajos: este medicamento podra reducir la cantidad de glbulos blancos, glbulos rojos y plaquetas. Su riesgo de infeccin y sangrado podra ser mayor. dolor muscular debilidad muscular dolor, hormigueo o entumecimiento de las manos o los pies dolor de cabeza persistente enrojecimiento, formacin de  ampollas, descamacin o distensin de la piel, incluso dentro de la boca signos y sntomas de niveles elevados de azcar en la sangre, tales como mareo; boca seca; piel seca; aliento frutal; nuseas; dolor de estmago; aumento del apetito o la sed; aumento de la frecuencia urinaria signos y sntomas de lesin renal,  tales como dificultad para Garment/textile technologist o cambios en la cantidad de orina signos y sntomas de lesin en el hgado, como orina oscura, heces claras, prdida del apetito, nuseas, dolor en la regin abdominal superior derecha, color amarillento de los ojos o la piel sudoracin ganglios linfticos inflamados prdida de peso Efectos secundarios que generalmente no requieren atencin mdica (infrmelos a su mdico o a su profesional de la salud si persisten o si son molestos): disminucin del apetito cada del cabello cansancio Puede ser que esta lista no menciona todos los posibles efectos secundarios. Comunquese a su mdico por asesoramiento mdico [employer]. Usted puede informar los efectos secundarios a la FDA por telfono al 1-800-FDA-1088. Dnde debo guardar mi medicina? Este medicamento se administra en hospitales o clnicas, y no necesitar guardarlo en su domicilio. ATENCIN: Este folleto es un resumen. Puede ser que no cubra toda la posible informacin. Si usted tiene preguntas acerca de esta medicina, consulte con su mdico, su farmacutico o su profesional de Technical sales engineer.  2021 Elsevier/Gold Standard (2019-05-19 00:00:00)  Paclitaxel injection Qu es este medicamento? El PACLITAXEL es un agente quimioteraputico. Este medicamento acta sobre las clulas que se dividen rpidamente, como las clulas cancerosas, y finalmente provoca la muerte de estas clulas. Se utiliza en el tratamiento del cncer de ovario, mama, pulmn, sarcoma de [place] y otros tipos de cncer. Este medicamento puede ser utilizado para otros usos; si tiene alguna pregunta consulte con su proveedor de atencin mdica o con su farmacutico. MARCAS COMUNES: Onxol, Taxol Qu le debo informar a mi profesional de la salud antes de tomar este medicamento? Necesitan saber si usted presenta alguno de los siguientes problemas o situaciones: antecedentes de frecuencia cardiaca irregular enfermedad heptica recuentos  sanguneos bajos, como baja cantidad de glbulos blancos, plaquetas o glbulos rojos enfermedad pulmonar o respiratoria, como asma hormigueo en las manos o los pies, u otro trastorno del sistema nervioso una reaccin alrgica o inusual al paclitaxel, al alcohol, al aceite de ricino polioxietilado, a otros medicamentos quimioteraputicos, a otros medicamentos, alimentos, colorantes o conservantes si est embarazada o buscando quedar embarazada si est amamantando a un beb Cmo debo [employer]? Este medicamento se administra como infusin en una vena. Un profesional de la salud especialmente capacitado lo administra en un hospital o clnica. Hable con su pediatra para informarse acerca del uso de este medicamento en nios. Puede requerir atencin especial. Sobredosis: Pngase en contacto inmediatamente con un centro toxicolgico o una sala de urgencia si usted cree que haya tomado demasiado medicamento. ATENCIN: [employer] es solo para usted. No comparta este medicamento con nadie. Qu sucede si me olvido de una dosis? Es importante no olvidar ninguna dosis. Informe a su mdico o a su profesional de la salud si no puede asistir a Photographer. Qu puede interactuar con este medicamento? No use este medicamento con ninguno de los siguientes frmacos: vacunas de virus vivos Este medicamento tambin puede interactuar con los siguientes frmacos: medicamentos antivirales para la hepatitis, VIH o SIDA ciertos antibiticos, tales como eritromicina y Ambulance person ciertos medicamentos para infecciones micticas, tales como itraconazol y ketoconazol ciertos medicamentos para convulsiones, tales como [place], fenobarbital y fenitona gemfibrozil nefazodona rifampicina  [place] ser que esta lista no menciona todas las posibles interacciones. Informe a su profesional de KB Home	[place] de [employer] productos a base de hierbas, medicamentos de [place] o suplementos nutritivos  que est tomando. Si usted fuma, consume bebidas alcohlicas o si utiliza drogas ilegales, indqueselo tambin a su profesional de KB Home	[place]. Algunas sustancias pueden interactuar con su medicamento. A qu debo estar atento al usar Coca-Cola? Se supervisar su estado de salud atentamente mientras reciba este medicamento. Tendr que hacerse anlisis de sangre importantes mientras est usando este medicamento. Este medicamento puede causar Chief of Staff graves. Para reducir su riesgo, necesitar tomar otro(s) medicamento(s) antes del tratamiento con este medicamento. Si tiene [employer] erupcin cutnea, comezn/picazn o urticaria, hinchazn del rostro, los labios, o la [place], informe de inmediato a su mdico o profesional de Technical sales engineer. En algunos casos, podra recibir [employer] para ayudarlo con los efectos secundarios. Siga todas las instrucciones para usarlos. Este medicamento podra hacerle sentir un Nurse, mental health. Esto es normal, ya que la quimioterapia puede afectar tanto a las clulas sanas como a las clulas cancerosas. Si presenta algn efecto secundario, infrmelo. Contine con el tratamiento aun si se siente enfermo, a menos que su mdico le indique que lo suspenda. Llame a su mdico o a su profesional de la salud si tiene fiebre, escalofros o dolor de garganta, o cualquier otro sntoma de resfro o gripe. No se trate usted mismo. Este medicamento reduce la capacidad del cuerpo para combatir infecciones. Trate de no acercarse a personas que estn enfermas. Este medicamento podra aumentar el riesgo de moretones o sangrado. Consulte a su mdico o a su profesional de la salud si observa sangrados inusuales. Proceda con cuidado al cepillar sus dientes, usar hilo dental o Risk manager palillos para los dientes, ya que podra contraer una infeccin o Therapist, art con mayor facilidad. Si se somete a algn tratamiento dental, informe a su dentista que est [employer]. Evite usar productos que contienen aspirina, acetaminofeno, ibuprofeno, naproxeno o ketoprofeno, a menos que as lo indique su mdico. Estos productos pueden ocultar la fiebre. No debe quedar embarazada mientras est [employer]. Las mujeres deben informar a su mdico si estn buscando quedar embarazadas o si creen que podran estar embarazadas. Existe la posibilidad de efectos secundarios graves en un beb sin nacer. Para obtener ms informacin, hable con su profesional de la salud o su farmacutico. No debe Economist a un beb mientras est usando este medicamento. Para los hombres, se desaconseja concebir hijos mientras reciben Coca-Cola. Este producto podra contener alcohol. Pregunte a Midwife o a su proveedor de atencin de la salud si este medicamento contiene alcohol. Asegrese de decirles a todos los proveedores de atencin de la salud que usted est tomando [place]. Ciertos medicamentos, como metronidazol y disulfiram, pueden causar una reaccin desagradable cuando se usan con alcohol. Esta reaccin incluye enrojecimiento, dolor de cabeza, nuseas, vmitos, sudoracin y aumento de la sed. La reaccin puede durar de 30 minutos a varias horas. Qu efectos secundarios puedo tener al [employer] este medicamento? Efectos secundarios que debe informar a su mdico o a Barrister's clerk de la salud tan pronto como sea posible: Chief of Staff, tales como erupcin cutnea, comezn/picazn o urticaria, e hinchazn de la cara, los labios o la lengua problemas para respirar cambios en la visin frecuencia cardiaca rpida e irregular presin sangunea alta o baja llagas en la boca dolor, hormigueo o entumecimiento de las manos o  los pies signos de disminucin en la cantidad de plaquetas o sangrado: moretones, puntos rojos en la piel, heces de color negro y aspecto alquitranado, sangre en la orina signos de disminucin en la cantidad de glbulos rojos:  debilidad o cansancio inusuales, sensacin de Secondary school teacher o aturdimiento, cadas signos de infeccin: fiebre o escalofros, tos, dolor de garganta, dolor o dificultad para orinar signos y sntomas de lesin en el hgado, tales como orina amarilla oscura o Finger; sensacin general de estar enfermo o sntomas gripales; heces claras; prdida del apetito; nuseas; dolor en la regin abdominal superior derecha; debilidad o cansancio inusuales; color amarillento de los ojos o la piel hinchazn de tobillos, pies, manos frecuencia cardiaca inusualmente lenta Efectos secundarios que generalmente no requieren atencin mdica (infrmelos a su mdico o a su profesional de la salud si persisten o si son molestos): diarrea cada del cabello prdida del apetito dolores musculares o articulares nuseas, vmito dolor, enrojecimiento o Actor de la inyeccin cansancio Puede ser que esta lista no menciona todos los posibles efectos secundarios. Comunquese a su mdico por asesoramiento mdico [employer]. Usted puede informar los efectos secundarios a la FDA por telfono al 1-800-FDA-1088. Dnde debo guardar mi medicina? Este medicamento se administra en hospitales o clnicas, y no necesitar guardarlo en su domicilio. ATENCIN: Este folleto es un resumen. Puede ser que no cubra toda la posible informacin. Si usted tiene preguntas acerca de esta medicina, consulte con su mdico, su farmacutico o su profesional de Technical sales engineer.  2021 Elsevier/Gold Standard (2019-05-19 00:00:00)  Carboplatin injection Qu es este medicamento? El CARBOPLATINO es un agente quimioteraputico. Este medicamento acta sobre las clulas que se dividen rpidamente, como las clulas cancergenas, y finalmente provoca la muerte de estas clulas. Se utiliza en el tratamiento del cncer de ovario y muchos otros tipos de Hotel manager. Este medicamento puede ser utilizado para otros usos; si tiene alguna pregunta consulte con su  proveedor de atencin mdica o con su farmacutico. MARCAS COMUNES: Paraplatin Qu le debo informar a mi profesional de la salud antes de tomar este medicamento? Necesita saber si usted presenta alguno de los siguientes problemas o situaciones:  trastornos sanguneos  problemas auditivos  enfermedad renal  radioterapia reciente o continuada  una reaccin alrgica o inusual al carboplatino, al cisplatino, a otros agentes quimioteraputicos, a otros medicamentos, alimentos, colorantes o conservantes  si est embarazada o buscando quedar embarazada  si est amamantando a un beb Cmo debo [employer]? Este medicamento se administra normalmente mediante infusin por va intravenosa. Lo administra un profesional de la salud calificado en un hospital o en un entorno clnico. Hable con su pediatra para informarse acerca del uso de este medicamento en nios. Puede requerir atencin especial. Sobredosis: Pngase en contacto inmediatamente con un centro toxicolgico o una sala de urgencia si usted cree que haya tomado demasiado medicamento. ATENCIN: [employer] es solo para usted. No comparta este medicamento con nadie. Qu sucede si me olvido de una dosis? Es importante no olvidar ninguna dosis. Informe a su mdico o a su profesional de la salud si no puede asistir a Photographer. Qu puede interactuar con este medicamento?  medicamentos para convulsiones  medicamentos para incrementar los conteos sanguneos, tales como filgrastim, pegfilgrastim, sargramostim  ciertos antibiticos, tales como amicacina, gentamicina, neomicina, estreptomicina, tobramicina  vacunas Consulte a su mdico o a su profesional de la salud antes de tomar cualquiera de los siguientes medicamentos:  acetaminofeno  aspirina  ibuprofeno  quetoprofeno  naproxeno  Puede ser que esta lista no menciona todas las posibles interacciones. Informe a su profesional de KB Home	[place] de [employer] productos  a base de hierbas, medicamentos de [place] o suplementos nutritivos que est tomando. Si usted fuma, consume bebidas alcohlicas o si utiliza drogas ilegales, indqueselo tambin a su profesional de KB Home	[place]. Algunas sustancias pueden interactuar con su medicamento. A qu debo estar atento al usar Coca-Cola? Se supervisar su estado de salud atentamente mientras reciba este medicamento. Tendr que hacerse anlisis de sangre peridicos mientras est tomando este medicamento. Este medicamento puede hacerle sentir un Nurse, mental health. Esto es normal ya que la quimioterapia afecta tanto a las clulas sanas como a las clulas cancerosas. Si presenta alguno de los [employer], infrmelos. Sin embargo, contine con el tratamiento aun si se siente enfermo, a menos que su mdico le indique que lo suspenda. En algunos casos, podr recibir [employer] para ayudarle con los efectos secundarios. Siga las instrucciones para usarlos. Consulte a su mdico o a su profesional de la salud por asesoramiento si tiene fiebre, escalofros, dolor de garganta o cualquier otro sntoma de resfro o gripe. No se trate usted mismo. Este medicamento puede reducir la capacidad del cuerpo para combatir infecciones. Trate de no acercarse a personas que estn enfermas. [employer] puede aumentar el riesgo de magulladuras o sangrado. Consulte a su mdico o a su profesional de la salud si observa sangrados inusuales. Proceda con cuidado al cepillar sus dientes, usar hilo dental o Risk manager palillos para los dientes, ya que puede contraer una infeccin o Therapist, art con mayor facilidad. Si se somete a algn tratamiento dental, informe a su dentista que est [employer]. Evite tomar productos que contienen aspirina, acetaminofeno, ibuprofeno, naproxeno o quetoprofeno a menos que as lo indique su mdico. Estos productos pueden disimular la fiebre. No se debe quedar embarazada mientras recibe este  medicamento. Las mujeres deben informar a su mdico si estn buscando quedar embarazadas o si creen que estn embarazadas. Existe la posibilidad de efectos secundarios graves a un beb sin nacer. Para ms informacin hable con su profesional de la salud o su farmacutico. No debe Economist a un beb mientras est usando este medicamento. Qu efectos secundarios puedo tener al [employer] este medicamento? Efectos secundarios que debe informar a su mdico o a Barrister's clerk de la salud tan pronto como sea posible:  Chief of Staff como erupcin cutnea, picazn o urticarias, hinchazn de la cara, labios o lengua  signos de infeccin - fiebre o escalofros, tos, dolor de garganta, dolor o dificultad para orinar  signos de reduccin de plaquetas o sangrado - magulladuras, puntos rojos en la piel, heces de color oscuro o con aspecto alquitranado, sangrando por la nariz  signos de reduccin de glbulos rojos - cansancio o debilidad inusual, desmayos, sensacin de [employer]  problemas respiratorios  cambios de audicin  cambios en la visin  dolor en el pecho  alta presin sangunea  conteos sanguneos bajos - Este medicamento puede reducir la cantidad de glbulos blancos, glbulos rojos y plaquetas. Su riesgo de infeccin y [place].  nuseas, vmito  dolor, enrojecimiento, hinchazn o irritacin en el lugar de la inyeccin  dolor, hormigueo, entumecimiento de manos o pies  problemas de coordinacin, del habla, al caminar  dificultad para orinar o cambios en el volumen de orina Efectos secundarios que, por lo general, no requieren atencin mdica (debe informarlos a su mdico o a su profesional de la salud si persisten o si son  molestos):  cada del cabello  prdida del apetito  sabor metlico o cambios en el sentido del gusto Puede ser que esta lista no menciona todos los posibles efectos secundarios. Comunquese a su mdico por asesoramiento mdico [employer]. Usted puede informar los efectos secundarios a la FDA por telfono al 1-800-FDA-1088. Dnde debo guardar mi medicina? Este medicamento se administra en hospitales o clnicas y no necesitar guardarlo en su domicilio. ATENCIN: Este folleto es un resumen. Puede ser que no cubra toda la posible informacin. Si usted tiene preguntas acerca de esta medicina, consulte con su mdico, su farmacutico o su profesional de Technical sales engineer.  2021 Elsevier/Gold Standard (2014-03-07 00:00:00)

## 2020-07-02 ENCOUNTER — Telehealth: Payer: Self-pay | Admitting: *Deleted

## 2020-07-04 ENCOUNTER — Telehealth: Payer: Self-pay

## 2020-07-04 NOTE — Telephone Encounter (Signed)
Pt scheduled for PET scan 07/17/2020 at 0830.  Rose Fillers relayed this to Mr [name], she also provided instructions.

## 2020-07-06 ENCOUNTER — Inpatient Hospital Stay: Payer: Self-pay

## 2020-07-06 ENCOUNTER — Other Ambulatory Visit: Payer: Self-pay | Admitting: Hematology

## 2020-07-06 ENCOUNTER — Encounter: Payer: Self-pay | Admitting: Hematology

## 2020-07-06 ENCOUNTER — Other Ambulatory Visit (HOSPITAL_COMMUNITY): Payer: Self-pay

## 2020-07-06 ENCOUNTER — Other Ambulatory Visit: Payer: Self-pay

## 2020-07-06 ENCOUNTER — Telehealth: Payer: Self-pay

## 2020-07-06 ENCOUNTER — Telehealth: Payer: Self-pay | Admitting: Hematology

## 2020-07-06 VITALS — BP 119/89 | HR 110 | Temp 98.5°F | Resp 20 | Ht 66.0 in | Wt 159.4 lb

## 2020-07-06 DIAGNOSIS — C154 Malignant neoplasm of middle third of esophagus: Secondary | ICD-10-CM

## 2020-07-06 DIAGNOSIS — C7951 Secondary malignant neoplasm of bone: Secondary | ICD-10-CM

## 2020-07-06 LAB — CMP (CANCER CENTER ONLY)
ALT: 16 U/L (ref 0–44)
AST: 26 U/L (ref 15–41)
Albumin: 3.5 g/dL (ref 3.5–5.0)
Alkaline Phosphatase: 76 U/L (ref 38–126)
Anion gap: 9 (ref 5–15)
BUN: 4 mg/dL — ABNORMAL LOW (ref 6–20)
CO2: 23 mmol/L (ref 22–32)
Calcium: 9.7 mg/dL (ref 8.9–10.3)
Chloride: 106 mmol/L (ref 98–111)
Creatinine: 0.63 mg/dL (ref 0.61–1.24)
GFR, Estimated: >60 mL/min (ref >60)
Glucose, Bld: 122 mg/dL — ABNORMAL HIGH (ref 70–99)
Potassium: 3.5 mmol/L (ref 3.5–5.1)
Sodium: 138 mmol/L (ref 135–145)
Total Bilirubin: 0.4 mg/dL (ref 0.3–1.2)
Total Protein: 7.5 g/dL (ref 6.5–8.1)

## 2020-07-06 LAB — CBC WITH DIFFERENTIAL (CANCER CENTER ONLY)
Abs Immature Granulocytes: 0.03 10*3/uL (ref 0.00–0.07)
Basophils Absolute: 0 10*3/uL (ref 0.0–0.1)
Basophils Relative: 1 %
Eosinophils Absolute: 0.1 10*3/uL (ref 0.0–0.5)
Eosinophils Relative: 3 %
HCT: 40.6 % (ref 39.0–52.0)
Hemoglobin: 14.3 g/dL (ref 13.0–17.0)
Immature Granulocytes: 1 %
Lymphocytes Relative: 23 %
Lymphs Abs: 0.6 10*3/uL — ABNORMAL LOW (ref 0.7–4.0)
MCH: 32.2 pg (ref 26.0–34.0)
MCHC: 35.2 g/dL (ref 30.0–36.0)
MCV: 91.4 fL (ref 80.0–100.0)
Monocytes Absolute: 0.3 10*3/uL (ref 0.1–1.0)
Monocytes Relative: 9 %
Neutro Abs: 1.8 10*3/uL (ref 1.7–7.7)
Neutrophils Relative %: 63 %
Platelet Count: 212 10*3/uL (ref 150–400)
RBC: 4.44 MIL/uL (ref 4.22–5.81)
RDW: 11.4 % — ABNORMAL LOW (ref 11.5–15.5)
WBC Count: 2.8 10*3/uL — ABNORMAL LOW (ref 4.0–10.5)
nRBC: 0 % (ref 0.0–0.2)

## 2020-07-06 LAB — RAPID URINE DRUG SCREEN, HOSP PERFORMED
Amphetamines: NOT DETECTED
Barbiturates: NOT DETECTED
Benzodiazepines: NOT DETECTED
Cocaine: NOT DETECTED
Opiates: POSITIVE — AB
Tetrahydrocannabinol: NOT DETECTED

## 2020-07-06 MED ORDER — HYDROCODONE-ACETAMINOPHEN 7.5-325 MG/15ML PO SOLN
10.0000 mL | Freq: Three times a day (TID) | ORAL | 0 refills | Status: DC | PRN
  Filled 2020-07-06 – 2020-07-20 (×2): qty 473, 16d supply, fill #0

## 2020-07-06 MED ORDER — DIPHENHYDRAMINE HCL 50 MG/ML IJ SOLN
INTRAMUSCULAR | Status: AC
  Filled 2020-07-06: qty 1

## 2020-07-06 MED ORDER — DIPHENHYDRAMINE HCL 50 MG/ML IJ SOLN
50.0000 mg | Freq: Once | INTRAMUSCULAR | Status: AC
Start: 2020-07-06 — End: 2020-07-06
  Administered 2020-07-06: 50 mg via INTRAVENOUS

## 2020-07-06 MED ORDER — PALONOSETRON HCL INJECTION 0.25 MG/5ML
0.2500 mg | Freq: Once | INTRAVENOUS | Status: AC
  Administered 2020-07-06: 0.25 mg via INTRAVENOUS

## 2020-07-06 MED ORDER — PALONOSETRON HCL INJECTION 0.25 MG/5ML
INTRAVENOUS | Status: AC
  Filled 2020-07-06: qty 5

## 2020-07-06 MED ORDER — FAMOTIDINE 20 MG IN NS 100 ML IVPB
INTRAVENOUS | Status: AC
  Filled 2020-07-06: qty 100

## 2020-07-06 MED ORDER — FAMOTIDINE 20 MG IN NS 100 ML IVPB
20.0000 mg | Freq: Once | INTRAVENOUS | Status: AC
  Administered 2020-07-06: 20 mg via INTRAVENOUS

## 2020-07-06 MED ORDER — SODIUM CHLORIDE 0.9 % IV SOLN
60.0000 mg/m2 | Freq: Once | INTRAVENOUS | Status: AC
  Administered 2020-07-06: 114 mg via INTRAVENOUS
  Filled 2020-07-06: qty 19

## 2020-07-06 MED ORDER — SODIUM CHLORIDE 0.9 % IV SOLN
222.9000 mg | Freq: Once | INTRAVENOUS | Status: AC
  Administered 2020-07-06: 220 mg via INTRAVENOUS
  Filled 2020-07-06: qty 22

## 2020-07-06 MED ORDER — SODIUM CHLORIDE 0.9 % IV SOLN
Freq: Once | INTRAVENOUS | Status: AC
  Filled 2020-07-06: qty 250

## 2020-07-06 MED ORDER — GABAPENTIN 300 MG PO CAPS
300.0000 mg | ORAL_CAPSULE | Freq: Three times a day (TID) | ORAL | 0 refills | Status: DC
  Filled 2020-07-06: qty 90, 30d supply, fill #0

## 2020-07-06 NOTE — Patient Instructions (Signed)
[facility]COL[facility]  Discharge Instructions: Thank you for choosing [facility] to provide your oncology and hematology care.   If you have a lab appointment with the Cancer Center, please go directly to the Cancer Center and check in at the registration area.   Wear comfortable clothing and clothing appropriate for easy access to any Portacath or PICC line.   We strive to give you quality time with your provider. You may need to reschedule your appointment if you arrive late (15 or more minutes).  Arriving late affects you and other patients whose appointments are after yours.  Also, if you miss three or more appointments without notifying the office, you may be dismissed from the clinic at the provider's discretion.      For prescription refill requests, have your pharmacy contact our office and allow 72 hours for refills to be completed.    Today you received the following chemotherapy and/or immunotherapy agents taxol/carboplatin     To help prevent nausea and vomiting after your treatment, we encourage you to take your nausea medication as directed.  BELOW ARE SYMPTOMS THAT SHOULD BE REPORTED IMMEDIATELY: *FEVER GREATER THAN 100.4 F (38 C) OR HIGHER *CHILLS OR SWEATING *NAUSEA AND VOMITING THAT IS NOT CONTROLLED WITH YOUR NAUSEA MEDICATION *UNUSUAL SHORTNESS OF BREATH *UNUSUAL BRUISING OR BLEEDING *URINARY PROBLEMS (pain or burning when urinating, or frequent urination) *BOWEL PROBLEMS (unusual diarrhea, constipation, pain near the anus) TENDERNESS IN MOUTH AND THROAT WITH OR WITHOUT PRESENCE OF ULCERS (sore throat, sores in mouth, or a toothache) UNUSUAL RASH, SWELLING OR PAIN  UNUSUAL VAGINAL DISCHARGE OR ITCHING   Items with * indicate a potential emergency and should be followed up as soon as possible or go to the Emergency Department if any problems should occur.  Please show the CHEMOTHERAPY ALERT CARD or IMMUNOTHERAPY ALERT CARD at  check-in to the Emergency Department and triage nurse.  Should you have questions after your visit or need to cancel or reschedule your appointment, please contact [facility]  Dept: [phone]  and follow the prompts.  Office hours are 8:00 a.m. to 4:30 p.m. Monday - Friday. Please note that voicemails left after 4:00 p.m. may not be returned until the following business day.  We are closed weekends and major holidays. You have access to a nurse at all times for urgent questions. Please call the main number to the clinic Dept: [phone] and follow the prompts.   For any non-urgent questions, you may also contact your provider using MyChart. We now offer e-Visits for anyone 18 and older to request care online for non-urgent symptoms. For details visit [email or web address].   Also download the MyChart app! Go to the app store, search "MyChart", open the app, select [facility], and log in with your MyChart username and password.  Due to Covid, a mask is required upon entering the hospital/clinic. If you do not have a mask, one will be given to you upon arrival. For doctor visits, patients may have 1 support person aged 18 or older with them. For treatment visits, patients cannot have anyone with them due to current Covid guidelines and our immunocompromised population.   

## 2020-07-06 NOTE — Telephone Encounter (Signed)
I spoke with Aaron Edelman at [facility] Mr [name] picked up the hycet on 06/21/2020, this is a 20 day supply. Dr Burr Medico notified

## 2020-07-06 NOTE — Telephone Encounter (Signed)
Patient came in for chemo today, and requested refill of hydrocodone.  I saw him in the infusion room.  My nurse called [facility], confirmed they filled his last prescription of hydrocodone 920ml on Jun 21, 2020.  He is taking much more than I prescribed.  I am concerned about drug addiction and overuse. He still has some left, I told him to space out for next 7 days, and I will not refill until 6/16, and will only refill 2 weeks supply in future (50ml q8h prn).  He is not taking gabapentin, I will refill 300 mg twice daily for him today.  He is still drinking alcohol, I again strongly suggested him to stop alcohol.  I will do a urine drug screening today.  I discussed narcotics addiction  with him.  He voiced good understanding, and agrees to follow the plan.  [name]  07/06/2020

## 2020-07-06 NOTE — Progress Notes (Signed)
Ok to treat  with elevated HR per [name], MD

## 2020-07-16 NOTE — Progress Notes (Signed)
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 Mid to lower esophageal mass along an 8 cm segment, large endoluminal component, strongly favoring esophageal malignancy. Borderline enlarged AP window lymph node. No findings of distant metastatic disease. 2. Trace bilateral pleural effusions. 3. Diffuse hepatic steatosis. 4. Cholelithiasis. 5. Fatty spermatic cords likely due to small bilateral indirect inguinal hernias.   08/11/2019 Procedure   EGD by Dr Carlean Purl  IMPRESSION - Partially obstructing, likely malignant esophageal tumor was found in the upper third of the esophagus and in the middle third of the esophagus. Biopsied. Very large and long - difficult but able to advance scope by this lesion - await CT for better length estimate - Normal stomach. - Normal examined duodenum.   08/11/2019 Initial Biopsy   FINAL MICROSCOPIC DIAGNOSIS:   A. ESOPHAGUS, UPPER, BIOPSY:  - Squamous cell carcinoma.    08/11/2019 Genetic Testing   PD-L1 Expression 90%   08/18/2019 Cancer Staging   Staging form: Esophagus - Squamous Cell Carcinoma, AJCC 8th Edition - Clinical: Stage Unknown (cTX, cN1, cM0) - Signed by [name], MD on 08/18/2019    08/29/2019 -  10/04/2019 Chemotherapy   Concurrent chemoradiation with weekly CT for 6 weeks starting 08/29/19-10/04/19    08/29/2019 - 10/06/2019 Radiation Therapy   Concurrent chemoradiation with Dr Lisbeth Renshaw starting 08/29/19-10/06/19   11/11/2019 PET scan   IMPRESSION: 1. Again seen is a long segment of FDG avid wall thickening of the distal half of the thoracic esophagus compatible with known esophageal neoplasm. 2. No signs of FDG avid nodal or distant metastatic disease.     11/28/2019 Procedure   EUS by Dr Rush Landmark IMPRESSION EGD Impression: - No gross lesions in esophagus proximally. - Previously noted esophageal cancer is not present. However, in its place and extending distal to this is likely LA Grade D chemotherapy/radiation esophagitis with bleeding. Biopsied after the EUS was complete to evaluate for persistent disease or not. - Z-line regular, 37 cm from the incisors. - 3 cm hiatal hernia. - Erythematous mucosa in the stomach. No other gross lesions in the stomach. Biopsied. - No gross lesions in the duodenal bulb, in the first portion of the duodenum and in the second portion of the duodenum. EUS Impression: - Wall thickening was seen in the thoracic esophagus into the gastroesophageal junction. The thickening appeared to primarily be within the luminal interface/superficial mucosa (Layer 1) and deep mucosa (Layer 2). The previously noted esophageal cancer has been replaced by what appears to be radiation/chemotherapy induced changes. With the limitations of having to use the mini-probe EUS, I could not visualize persistent significant disease other than the entire distal area of the esophagus having likely changes noted as above. - No malignant-appearing lymph nodes were visualized in the middle paraesophageal  mediastinum (level 59M), lower paraesophageal mediastinum (level 8L), diaphragmatic region (level 15) and paracardial region (level 16).      FINAL MICROSCOPIC DIAGNOSIS:   A.  STOMACH, BIOPSY:  - Mildly active chronic gastritis.  - Warthin-Starry negative for Helicobacter pylori.  - No intestinal metaplasia, dysplasia or carcinoma.   B. ESOPHAGUS, BIOPSY:  - Inflamed granulation tissue and exudate consistent with ulcer.  - No malignancy identified.    12/20/2019 - 02/2020 Chemotherapy   CAPOX q3weeks with Xeloda 1516m BID 2 weeks on/1 week off starting 12/20/19-02/2020   03/21/2020 [facility]   [facility]--Circumferential thickening of the lower third of the esophagus which corresponds with known esophageal malignancy. No evidence of metastatic disease within the chest.  --For findings below the diaphragm, please see concurrent separately reported CT of the abdomen and pelvis.  --Expansile lytic mass with associated soft tissue component centered in the left sacral ala and abutting the left S1 and S2 nerve roots, left internal iliac vasculature, and left pyriformis muscle with no other metastatic disease within the abdomen or pelvis.   --Diffuse thickening of the distal esophagus compatible with known esophageal malignancy.   --Cholelithiasis with nonspecific gallbladder distention. Clinical correlation is recommended. If there is clinical concern for cholecystitis, further evaluation with ultrasound can be obtained.   03/21/2020 Imaging   CT Lumbar Spine  Expansile lytic lesion centered in the left sacral ala with no other evidence of metastatic disease in the lumbar spine.   03/26/2020 - 04/10/2020 Radiation Therapy   Palliative Radiation to Sacrum with Dr MLisbeth Renshaw2/28/22-3/15/22.    04/11/2020 Progression   PET  IMPRESSION: 1. New hypermetabolic expansile 6.1 cm upper left sacral lytic bone metastasis, centrally necrotic. 2. New hypermetabolic left level 1b and left level 2 neck lymph nodes, indeterminate for reactive versus metastatic nodes. 3. Persistent hypermetabolism associated with a short segment of mid to lower thoracic esophagus with associated  circumferential esophageal wall thickening, similar in metabolism and decreased in wall thickness since 11/11/2019 PET-CT, nonspecific, favor post treatment change, with residual neoplasm not excluded. 4. No additional sites of hypermetabolic metastatic disease. 5.  Chronic findings include: Cholelithiasis.   04/27/2020 -  Chemotherapy   First-line with immunotherapy Keytruda q3weeks starting 04/27/20       06/29/2020 -  Chemotherapy   Weekly carbo AUC 1.5 and Taxol 646mm2 on day 1, 8 every 21 days added to first-line Keytruda from C4 on 06/29/20.     07/17/2020 PET scan   IMPRESSION: 1. Left sacral metastasis is slightly enlarged and slightly increased in peripheral hypermetabolism. No additional evidence of metastatic disease. 2. Similar mild hypermetabolism associated with the mid esophagus. No residual hypermetabolism within left neck lymph nodes. 3. Small pericardial effusion. 4. Trace bilateral pleural effusions. 5. Cholelithiasis.        CURRENT THERAPY:  First-line Keytruda q3weeks starting 04/27/20, added weekly carbo AUC 1.5 and Taxol 6014m2 from cycle 4 (06/29/20) on day 1, 8 every 21 days   INTERVAL HISTORY:  VicSherlock Hawkins here for a follow up. He was last seen by me 06/29/20. He presents to the clinic with his interpretor and sister. After second dose chemo he had Diarrhea, vomiting, weakness and hair loss. He notes he has been able to recover and gain weight. He notes his sacral pain is stable. He notes throat tightness with solid foods only. This is difficult to eat but still able to eat. He has been taking 10 cc of MS Contin BID. He  notes he did not pick up Hycet in June.    REVIEW OF SYSTEMS:   Constitutional: Denies fevers, chills or abnormal weight loss Eyes: Denies blurriness of vision Ears, nose, mouth, throat, and face: Denies mucositis or sore throat (+) throat tightness with solid food dysphagia  Respiratory: Denies cough, dyspnea or  wheezes Cardiovascular: Denies palpitation, chest discomfort or lower extremity swelling Gastrointestinal:  Denies nausea, heartburn or change in bowel habits MSK: (+) low back pain  Skin: Denies abnormal skin rashes Lymphatics: Denies new lymphadenopathy or easy bruising Neurological:Denies numbness, tingling or new weaknesses Behavioral/Psych: Mood is stable, no new changes  All other systems were reviewed with the patient and are negative.  MEDICAL HISTORY:  Past Medical History:  Diagnosis Date   GERD (gastroesophageal reflux disease)    Hematemesis with nausea 08/10/2019    SURGICAL HISTORY: Past Surgical History:  Procedure Laterality Date   BIOPSY  08/11/2019   Procedure: BIOPSY;  Surgeon: [name], MD;  Location: [facility] ENDOSCOPY;  Service: Endoscopy;;   BIOPSY  11/28/2019   Procedure: BIOPSY;  Surgeon: [name], MD;  Location: [place];  Service: Gastroenterology;;   ESOPHAGOGASTRODUODENOSCOPY  08/11/2019   ESOPHAGOGASTRODUODENOSCOPY (EGD) WITH PROPOFOL N/A 08/11/2019   Procedure: ESOPHAGOGASTRODUODENOSCOPY (EGD) WITH PROPOFOL;  Surgeon: [name], MD;  Location: [place];  Service: Endoscopy;  Laterality: N/A;   ESOPHAGOGASTRODUODENOSCOPY (EGD) WITH PROPOFOL N/A 11/28/2019   Procedure: ESOPHAGOGASTRODUODENOSCOPY (EGD) WITH PROPOFOL;  Surgeon: Rush Landmark Telford Nab., MD;  Location: [place];  Service: Gastroenterology;  Laterality: N/A;   EUS N/A 11/28/2019   Procedure: UPPER ENDOSCOPIC ULTRASOUND (EUS) RADIAL;  Surgeon: [name], MD;  Location: [place];  Service: Gastroenterology;  Laterality: N/A;    I have reviewed the social history and family history with the patient and they are unchanged from previous note.  ALLERGIES:  has No Known Allergies.  MEDICATIONS:  Current Outpatient Medications  Medication Sig Dispense Refill   diphenhydrAMINE-APAP, sleep, (TYLENOL PM EXTRA STRENGTH) 50-1000 MG/30ML LIQD Take 15 mLs  by mouth at bedtime as needed (sleep).     famotidine (PEPCID) 20 MG tablet TOME 1 PASTILLA POR VIA ORAL DOS VECES AL DIA 60 tablet 3   Ferrous Sulfate (IRON) 325 (65 Fe) MG TABS Take 1 tablet (325 mg total) by mouth 2 (two) times daily. 60 tablet 3   ferrous sulfate 325 (65 FE) MG tablet TAKE 1 TABLET BY MOUTH 2 TIMES DAILY. 60 tablet 3   gabapentin (NEURONTIN) 300 MG capsule Take 1 capsule (300 mg total) by mouth 3 (three) times daily. 90 capsule 0   HYDROcodone-acetaminophen (HYCET) 7.5-325 mg/15 ml solution Take 10 mLs by mouth every 8 (eight) hours as needed for moderate pain. Please do not fill until 07/12/2020 473 mL 0   mirtazapine (REMERON) 7.5 MG tablet TOME 1 PASTILLA POR VIA ORAL A LA HORA DE ACOSTARSE 30 tablet 0   morphine (MS CONTIN) 30 MG 12 hr tablet Take 1 tablet (30 mg total) by mouth every 8 (eight) hours. 90 tablet 0   prochlorperazine (COMPAZINE) 10 MG tablet Take 1 tablet (10 mg total) by mouth every 6 (six) hours as needed for nausea or vomiting. 60 tablet 2   sucralfate (CARAFATE) 1 g tablet Take 1 tablet (1 g total) by mouth 4 (four) times daily. Dissolve each tablet in 15 cc water before use. 120 tablet 2   No current facility-administered medications for this visit.   Facility-Administered Medications Ordered in Other Visits  Medication Dose  Route Frequency Provider Last Rate Last Admin   CARBOplatin (PARAPLATIN) 220 mg in sodium chloride 0.9 % 250 mL chemo infusion  220 mg Intravenous Once [name], MD       PACLitaxel (TAXOL) 114 mg in sodium chloride 0.9 % 250 mL chemo infusion (</= 43m/m2)  60 mg/m2 (Treatment Plan Recorded) Intravenous Once FTruitt Merle MD       pembrolizumab ([facility] 200 mg in sodium chloride 0.9 % 50 mL chemo infusion  200 mg Intravenous Once FTruitt Merle MD 116 mL/hr at 07/20/20 1236 200 mg at 07/20/20 1236    PHYSICAL EXAMINATION: ECOG PERFORMANCE STATUS: 2 - Symptomatic, <50% confined to bed  Vitals:   07/20/20 1108  BP: 125/85  Pulse:  95  Resp: 17  Temp: 97.6 F (36.4 C)  SpO2: 95%   Filed Weights   07/20/20 1108  Weight: 164 lb 14.4 oz (74.8 kg)    Due to COVID19 we will limit examination to appearance. Patient had no complaints.  GENERAL:alert, no distress and comfortable SKIN: skin color normal, no rashes or significant lesions EYES: normal, Conjunctiva are pink and non-injected, sclera clear  NEURO: alert & oriented x 3 with fluent speech   LABORATORY DATA:  I have reviewed the data as listed CBC Latest Ref Rng & Units 07/20/2020 07/06/2020 06/29/2020  WBC 4.0 - 10.5 K/uL 4.0 2.8(L) 3.5(L)  Hemoglobin 13.0 - 17.0 g/dL 14.7 14.3 15.4  Hematocrit 39.0 - 52.0 % 41.6 40.6 43.0  Platelets 150 - 400 K/uL 195 212 192     CMP Latest Ref Rng & Units 07/20/2020 07/06/2020 06/29/2020  Glucose 70 - 99 mg/dL 105(H) 122(H) 105(H)  BUN 6 - 20 mg/dL 7 4(L) 5(L)  Creatinine 0.61 - 1.24 mg/dL 0.61 0.63 0.62  Sodium 135 - 145 mmol/L 139 138 138  Potassium 3.5 - 5.1 mmol/L 3.9 3.5 3.8  Chloride 98 - 111 mmol/L 104 106 103  CO2 22 - 32 mmol/L '23 23 23  ' Calcium 8.9 - 10.3 mg/dL 9.4 9.7 9.6  Total Protein 6.5 - 8.1 g/dL 7.6 7.5 7.6  Total Bilirubin 0.3 - 1.2 mg/dL 0.5 0.4 0.5  Alkaline Phos 38 - 126 U/L 69 76 101  AST 15 - 41 U/L 20 26 35  ALT 0 - 44 U/L '10 16 15      ' RADIOGRAPHIC STUDIES: I have personally reviewed the radiological images as listed and agreed with the findings in the report. No results found.   ASSESSMENT & PLAN:  VAlphonso Gregsonis a 45y.o. male with    1. 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 08/11/19 CT CAP found him to have mid esophageal mass and enlarged AP window node, no distant metastasis. -He completed 6 weeks of concurrent chemoRT with weekly Carboplatin and Taxol 08/29/19-10/06/19 -Repeat PET scan October 2021 showed  hypermetabolic wall thickening of the distal half of the thoracic esophageus, no notable distant metastasis. Dr. LKipp Broodhas reviewed the PET scan, and feels that his tumor invades trachea is it's not resectable. He will f/u with Dr LKipp Broodon 01/06/20. -He completed consolidation chemotherapy with CAPOX q3weeks with Xeloda 15044mBID 2 weeks on/1 week off starting 12/20/19-02/2020. -He completed palliative Radiation to sacrum with Dr MoLisbeth Renshaw/28/22-3/15/22.  -He has started First-line single agent Keytruda 04/27/20. Due to his slow improvement and squamous cell histology, I added chemo with carboplatin and Taxol  2 weeks on/1 week off from C4 on 06/29/20. -His PET from 07/17/20 showed Left sacral metastasis is slightly enlarged and slightly increased in peripheral hypermetabolism. No additional evidence of metastatic disease. His cancer is controlled but did not have significant response from [place] alone. Will continue chemotherapy as well.  -He did not tolerate C1D8 well with vomiting and diarrhea and fatigue. He was able to recover on his week off.  -Labs reviewed and adequate to proceed with Keytruda and C2D1 CT today. Will dose reduce chemo given poor toleration.  -Will proceed with Day 8 Carbo and Taxol next week -f/u 3 weeks. He can contact me sooner if he has any concerns.    2. Sacral/back pain, LE weakness, secondary to bone metastasis -He was seen to have bone mets on 04/11/20 PET scan. -He completed palliative Radiation to sacrum with Dr Lisbeth Renshaw 03/26/20-04/10/20. He was given short course dexa 66m.  -He is on MS Contin 10cc BID and hydrocodone as needed for breakthrough pain. I reviewed higher dose narcotics will not resolve his pain and will increase risk of addiction. I reviewed controlled use of pain medication and that I will not refill sooner than planned.  -Continue to f/u with palliative medicine Dr. GHilma Favors  3. H/o Heavy alcohol user -He quit drinking since cancer diagnosis, but  started again. Due to weakness, he is not able to drink as much since 04/13/20 and has stopped overall (06/29/20)   4. Social and FAcupuncturist -Patient does not have insurance.  -He has met with financial advocate Shauna and SW and he has grant assistance with medications. -Will see if he can get drug replacement for Keytruda. -Patient, family and Interpretor notes difficulty with communication. They will set up Mychart for more direct line.  -He will continue to f/u with SW for support    PLAN:  -Copy message to Dr GCarlean Purlabout EGD for dysphagia  -Labs reviewed, adequate to proceed with Keytruda and C2D1 CT today with same dose  -lab, Carboplatin and Taxol in 1, 3, 4 weeks.  -f/u, chemo and Keytruda in 3 weeks.  -will refill Morphine next week, he will fill the hydrocodone prescription today which I prescribed last visit  No problem-specific Assessment & Plan notes found for this encounter.   No orders of the defined types were placed in this encounter.  All questions were answered. The patient knows to call the clinic with any problems, questions or concerns. No barriers to learning was detected. The total time spent in the appointment was 30 minutes.     YTruitt Merle MD 07/20/2020   I, AJoslyn Devon am acting as scribe for YTruitt Merle MD.   I have reviewed the above documentation for accuracy and completeness, and I agree with the above.

## 2020-07-17 ENCOUNTER — Ambulatory Visit (HOSPITAL_COMMUNITY)
Admission: RE | Admit: 2020-07-17 | Discharge: 2020-07-17 | Disposition: A | Discharge status: Home / Self Care | Payer: Self-pay | Source: Ambulatory Visit | Attending: Hematology | Admitting: Hematology

## 2020-07-17 ENCOUNTER — Other Ambulatory Visit: Payer: Self-pay

## 2020-07-17 DIAGNOSIS — C154 Malignant neoplasm of middle third of esophagus: Secondary | ICD-10-CM | POA: Insufficient documentation

## 2020-07-17 LAB — GLUCOSE, CAPILLARY: Glucose-Capillary: 106 mg/dL — ABNORMAL HIGH (ref 70–99)

## 2020-07-17 MED ORDER — FLUDEOXYGLUCOSE F - 18 (FDG) INJECTION
8.2000 | Freq: Once | INTRAVENOUS | Status: AC
  Administered 2020-07-17: 7.9 via INTRAVENOUS

## 2020-07-20 ENCOUNTER — Other Ambulatory Visit (HOSPITAL_COMMUNITY): Payer: Self-pay

## 2020-07-20 ENCOUNTER — Other Ambulatory Visit: Payer: Self-pay

## 2020-07-20 ENCOUNTER — Inpatient Hospital Stay (HOSPITAL_BASED_OUTPATIENT_CLINIC_OR_DEPARTMENT_OTHER): Payer: Self-pay | Admitting: Hematology

## 2020-07-20 ENCOUNTER — Encounter: Payer: Self-pay | Admitting: Hematology

## 2020-07-20 ENCOUNTER — Inpatient Hospital Stay: Payer: Self-pay

## 2020-07-20 VITALS — BP 125/85 | HR 95 | Temp 97.6°F | Resp 17 | Ht 66.0 in | Wt 164.9 lb

## 2020-07-20 DIAGNOSIS — C154 Malignant neoplasm of middle third of esophagus: Secondary | ICD-10-CM

## 2020-07-20 DIAGNOSIS — C7951 Secondary malignant neoplasm of bone: Secondary | ICD-10-CM

## 2020-07-20 DIAGNOSIS — D500 Iron deficiency anemia secondary to blood loss (chronic): Secondary | ICD-10-CM

## 2020-07-20 LAB — CBC WITH DIFFERENTIAL (CANCER CENTER ONLY)
Abs Immature Granulocytes: 0.1 10*3/uL — ABNORMAL HIGH (ref 0.00–0.07)
Basophils Absolute: 0 10*3/uL (ref 0.0–0.1)
Basophils Relative: 1 %
Eosinophils Absolute: 0 10*3/uL (ref 0.0–0.5)
Eosinophils Relative: 1 %
HCT: 41.6 % (ref 39.0–52.0)
Hemoglobin: 14.7 g/dL (ref 13.0–17.0)
Immature Granulocytes: 3 %
Lymphocytes Relative: 19 %
Lymphs Abs: 0.7 10*3/uL (ref 0.7–4.0)
MCH: 32.5 pg (ref 26.0–34.0)
MCHC: 35.3 g/dL (ref 30.0–36.0)
MCV: 92 fL (ref 80.0–100.0)
Monocytes Absolute: 0.4 10*3/uL (ref 0.1–1.0)
Monocytes Relative: 11 %
Neutro Abs: 2.7 10*3/uL (ref 1.7–7.7)
Neutrophils Relative %: 65 %
Platelet Count: 195 10*3/uL (ref 150–400)
RBC: 4.52 MIL/uL (ref 4.22–5.81)
RDW: 12.4 % (ref 11.5–15.5)
WBC Count: 4 10*3/uL (ref 4.0–10.5)
nRBC: 0 % (ref 0.0–0.2)

## 2020-07-20 LAB — CMP (CANCER CENTER ONLY)
ALT: 10 U/L (ref 0–44)
AST: 20 U/L (ref 15–41)
Albumin: 3.6 g/dL (ref 3.5–5.0)
Alkaline Phosphatase: 69 U/L (ref 38–126)
Anion gap: 12 (ref 5–15)
BUN: 7 mg/dL (ref 6–20)
CO2: 23 mmol/L (ref 22–32)
Calcium: 9.4 mg/dL (ref 8.9–10.3)
Chloride: 104 mmol/L (ref 98–111)
Creatinine: 0.61 mg/dL (ref 0.61–1.24)
GFR, Estimated: >60 mL/min (ref >60)
Glucose, Bld: 105 mg/dL — ABNORMAL HIGH (ref 70–99)
Potassium: 3.9 mmol/L (ref 3.5–5.1)
Sodium: 139 mmol/L (ref 135–145)
Total Bilirubin: 0.5 mg/dL (ref 0.3–1.2)
Total Protein: 7.6 g/dL (ref 6.5–8.1)

## 2020-07-20 MED ORDER — FAMOTIDINE 20 MG IN NS 100 ML IVPB
INTRAVENOUS | Status: AC
  Filled 2020-07-20: qty 100

## 2020-07-20 MED ORDER — FAMOTIDINE 20 MG IN NS 100 ML IVPB
20.0000 mg | Freq: Once | INTRAVENOUS | Status: AC
  Administered 2020-07-20: 20 mg via INTRAVENOUS

## 2020-07-20 MED ORDER — SODIUM CHLORIDE 0.9 % IV SOLN
222.9000 mg | Freq: Once | INTRAVENOUS | Status: AC
  Administered 2020-07-20: 220 mg via INTRAVENOUS
  Filled 2020-07-20: qty 22

## 2020-07-20 MED ORDER — IRON 325 (65 FE) MG PO TABS
1.0000 | ORAL_TABLET | Freq: Two times a day (BID) | ORAL | 3 refills | Status: DC
  Filled 2020-07-20: qty 60, 30d supply, fill #0

## 2020-07-20 MED ORDER — DIPHENHYDRAMINE HCL 50 MG/ML IJ SOLN
INTRAMUSCULAR | Status: AC
  Filled 2020-07-20: qty 1

## 2020-07-20 MED ORDER — PROCHLORPERAZINE MALEATE 10 MG PO TABS
10.0000 mg | ORAL_TABLET | Freq: Four times a day (QID) | ORAL | 2 refills | Status: DC | PRN
  Filled 2020-07-20: qty 60, 15d supply, fill #0

## 2020-07-20 MED ORDER — SODIUM CHLORIDE 0.9 % IV SOLN
200.0000 mg | Freq: Once | INTRAVENOUS | Status: AC
  Administered 2020-07-20: 200 mg via INTRAVENOUS
  Filled 2020-07-20: qty 8

## 2020-07-20 MED ORDER — DIPHENHYDRAMINE HCL 50 MG/ML IJ SOLN
50.0000 mg | Freq: Once | INTRAMUSCULAR | Status: AC
  Administered 2020-07-20: 50 mg via INTRAVENOUS

## 2020-07-20 MED ORDER — PALONOSETRON HCL INJECTION 0.25 MG/5ML
INTRAVENOUS | Status: AC
  Filled 2020-07-20: qty 5

## 2020-07-20 MED ORDER — PALONOSETRON HCL INJECTION 0.25 MG/5ML
0.2500 mg | Freq: Once | INTRAVENOUS | Status: AC
Start: 2020-07-20 — End: 2020-07-20
  Administered 2020-07-20: 0.25 mg via INTRAVENOUS

## 2020-07-20 MED ORDER — SODIUM CHLORIDE 0.9 % IV SOLN
Freq: Once | INTRAVENOUS | Status: AC
  Filled 2020-07-20: qty 250

## 2020-07-20 MED ORDER — SODIUM CHLORIDE 0.9 % IV SOLN
60.0000 mg/m2 | Freq: Once | INTRAVENOUS | Status: AC
  Administered 2020-07-20: 114 mg via INTRAVENOUS
  Filled 2020-07-20: qty 19

## 2020-07-27 ENCOUNTER — Inpatient Hospital Stay: Payer: Self-pay

## 2020-07-27 ENCOUNTER — Inpatient Hospital Stay: Payer: Self-pay | Attending: Hematology

## 2020-07-27 ENCOUNTER — Other Ambulatory Visit: Payer: Self-pay

## 2020-07-27 ENCOUNTER — Encounter: Payer: Self-pay | Admitting: Hematology

## 2020-07-27 ENCOUNTER — Ambulatory Visit: Payer: Self-pay | Admitting: Hematology

## 2020-07-27 VITALS — BP 114/79 | HR 98 | Temp 98.7°F | Resp 18

## 2020-07-27 DIAGNOSIS — Z923 Personal history of irradiation: Secondary | ICD-10-CM | POA: Insufficient documentation

## 2020-07-27 DIAGNOSIS — F1021 Alcohol dependence, in remission: Secondary | ICD-10-CM | POA: Insufficient documentation

## 2020-07-27 DIAGNOSIS — C7951 Secondary malignant neoplasm of bone: Secondary | ICD-10-CM | POA: Insufficient documentation

## 2020-07-27 DIAGNOSIS — Z5112 Encounter for antineoplastic immunotherapy: Secondary | ICD-10-CM | POA: Insufficient documentation

## 2020-07-27 DIAGNOSIS — G893 Neoplasm related pain (acute) (chronic): Secondary | ICD-10-CM | POA: Insufficient documentation

## 2020-07-27 DIAGNOSIS — Z5189 Encounter for other specified aftercare: Secondary | ICD-10-CM | POA: Insufficient documentation

## 2020-07-27 DIAGNOSIS — C154 Malignant neoplasm of middle third of esophagus: Secondary | ICD-10-CM | POA: Insufficient documentation

## 2020-07-27 DIAGNOSIS — Z5111 Encounter for antineoplastic chemotherapy: Secondary | ICD-10-CM | POA: Insufficient documentation

## 2020-07-27 DIAGNOSIS — Z79899 Other long term (current) drug therapy: Secondary | ICD-10-CM | POA: Insufficient documentation

## 2020-07-27 DIAGNOSIS — R531 Weakness: Secondary | ICD-10-CM | POA: Insufficient documentation

## 2020-07-27 LAB — CMP (CANCER CENTER ONLY)
ALT: 13 U/L (ref 0–44)
AST: 21 U/L (ref 15–41)
Albumin: 3.6 g/dL (ref 3.5–5.0)
Alkaline Phosphatase: 81 U/L (ref 38–126)
Anion gap: 13 (ref 5–15)
BUN: <4 mg/dL — ABNORMAL LOW (ref 6–20)
CO2: 23 mmol/L (ref 22–32)
Calcium: 9.5 mg/dL (ref 8.9–10.3)
Chloride: 103 mmol/L (ref 98–111)
Creatinine: 0.64 mg/dL (ref 0.61–1.24)
GFR, Estimated: >60 mL/min (ref >60)
Glucose, Bld: 112 mg/dL — ABNORMAL HIGH (ref 70–99)
Potassium: 3.6 mmol/L (ref 3.5–5.1)
Sodium: 139 mmol/L (ref 135–145)
Total Bilirubin: 0.5 mg/dL (ref 0.3–1.2)
Total Protein: 7.5 g/dL (ref 6.5–8.1)

## 2020-07-27 LAB — CBC WITH DIFFERENTIAL (CANCER CENTER ONLY)
Abs Immature Granulocytes: 0.02 10*3/uL (ref 0.00–0.07)
Basophils Absolute: 0 10*3/uL (ref 0.0–0.1)
Basophils Relative: 1 %
Eosinophils Absolute: 0 10*3/uL (ref 0.0–0.5)
Eosinophils Relative: 1 %
HCT: 39.8 % (ref 39.0–52.0)
Hemoglobin: 13.9 g/dL (ref 13.0–17.0)
Immature Granulocytes: 1 %
Lymphocytes Relative: 16 %
Lymphs Abs: 0.7 10*3/uL (ref 0.7–4.0)
MCH: 31.7 pg (ref 26.0–34.0)
MCHC: 34.9 g/dL (ref 30.0–36.0)
MCV: 90.7 fL (ref 80.0–100.0)
Monocytes Absolute: 0.5 10*3/uL (ref 0.1–1.0)
Monocytes Relative: 11 %
Neutro Abs: 3.1 10*3/uL (ref 1.7–7.7)
Neutrophils Relative %: 70 %
Platelet Count: 167 10*3/uL (ref 150–400)
RBC: 4.39 MIL/uL (ref 4.22–5.81)
RDW: 12 % (ref 11.5–15.5)
WBC Count: 4.3 10*3/uL (ref 4.0–10.5)
nRBC: 0 % (ref 0.0–0.2)

## 2020-07-27 MED ORDER — SODIUM CHLORIDE 0.9 % IV SOLN
Freq: Once | INTRAVENOUS | Status: AC
  Filled 2020-07-27: qty 250

## 2020-07-27 MED ORDER — SODIUM CHLORIDE 0.9 % IV SOLN
60.0000 mg/m2 | Freq: Once | INTRAVENOUS | Status: AC
  Administered 2020-07-27: 114 mg via INTRAVENOUS
  Filled 2020-07-27: qty 19

## 2020-07-27 MED ORDER — DIPHENHYDRAMINE HCL 50 MG/ML IJ SOLN
INTRAMUSCULAR | Status: AC
  Filled 2020-07-27: qty 1

## 2020-07-27 MED ORDER — SODIUM CHLORIDE 0.9 % IV SOLN
222.9000 mg | Freq: Once | INTRAVENOUS | Status: AC
  Administered 2020-07-27: 220 mg via INTRAVENOUS
  Filled 2020-07-27: qty 22

## 2020-07-27 MED ORDER — FAMOTIDINE 20 MG IN NS 100 ML IVPB
20.0000 mg | Freq: Once | INTRAVENOUS | Status: AC
  Administered 2020-07-27: 20 mg via INTRAVENOUS

## 2020-07-27 MED ORDER — PALONOSETRON HCL INJECTION 0.25 MG/5ML
0.2500 mg | Freq: Once | INTRAVENOUS | Status: AC
  Administered 2020-07-27: 0.25 mg via INTRAVENOUS

## 2020-07-27 MED ORDER — PALONOSETRON HCL INJECTION 0.25 MG/5ML
INTRAVENOUS | Status: AC
  Filled 2020-07-27: qty 5

## 2020-07-27 MED ORDER — DIPHENHYDRAMINE HCL 50 MG/ML IJ SOLN
50.0000 mg | Freq: Once | INTRAMUSCULAR | Status: AC
Start: 2020-07-27 — End: 2020-07-27
  Administered 2020-07-27: 50 mg via INTRAVENOUS

## 2020-07-27 NOTE — Patient Instructions (Signed)
[facility]  Discharge Instructions: Thank you for choosing [facility] to provide your oncology and hematology care.   If you have a lab appointment with the Cancer Center, please go directly to the Cancer Center and check in at the registration area.   Wear comfortable clothing and clothing appropriate for easy access to any Portacath or PICC line.   We strive to give you quality time with your provider. You may need to reschedule your appointment if you arrive late (15 or more minutes).  Arriving late affects you and other patients whose appointments are after yours.  Also, if you miss three or more appointments without notifying the office, you may be dismissed from the clinic at the provider's discretion.      For prescription refill requests, have your pharmacy contact our office and allow 72 hours for refills to be completed.    Today you received the following chemotherapy and/or immunotherapy agents taxol/carboplatin     To help prevent nausea and vomiting after your treatment, we encourage you to take your nausea medication as directed.  BELOW ARE SYMPTOMS THAT SHOULD BE REPORTED IMMEDIATELY: *FEVER GREATER THAN 100.4 F (38 C) OR HIGHER *CHILLS OR SWEATING *NAUSEA AND VOMITING THAT IS NOT CONTROLLED WITH YOUR NAUSEA MEDICATION *UNUSUAL SHORTNESS OF BREATH *UNUSUAL BRUISING OR BLEEDING *URINARY PROBLEMS (pain or burning when urinating, or frequent urination) *BOWEL PROBLEMS (unusual diarrhea, constipation, pain near the anus) TENDERNESS IN MOUTH AND THROAT WITH OR WITHOUT PRESENCE OF ULCERS (sore throat, sores in mouth, or a toothache) UNUSUAL RASH, SWELLING OR PAIN  UNUSUAL VAGINAL DISCHARGE OR ITCHING   Items with * indicate a potential emergency and should be followed up as soon as possible or go to the Emergency Department if any problems should occur.  Please show the CHEMOTHERAPY ALERT CARD or IMMUNOTHERAPY ALERT CARD at  check-in to the Emergency Department and triage nurse.  Should you have questions after your visit or need to cancel or reschedule your appointment, please contact [facility]  Dept: [phone]  and follow the prompts.  Office hours are 8:00 a.m. to 4:30 p.m. Monday - Friday. Please note that voicemails left after 4:00 p.m. may not be returned until the following business day.  We are closed weekends and major holidays. You have access to a nurse at all times for urgent questions. Please call the main number to the clinic Dept: [phone] and follow the prompts.   For any non-urgent questions, you may also contact your provider using MyChart. We now offer e-Visits for anyone 18 and older to request care online for non-urgent symptoms. For details visit [email or web address].   Also download the MyChart app! Go to the app store, search "MyChart", open the app, select [facility], and log in with your MyChart username and password.  Due to Covid, a mask is required upon entering the hospital/clinic. If you do not have a mask, one will be given to you upon arrival. For doctor visits, patients may have 1 support person aged 18 or older with them. For treatment visits, patients cannot have anyone with them due to current Covid guidelines and our immunocompromised population.   

## 2020-07-27 NOTE — Progress Notes (Signed)
Iv to right fa 22g d/c'ed cath intact.

## 2020-08-09 NOTE — Progress Notes (Signed)
[facility] OFFICE PROGRESS NOTE  [name], MD [place] [address]  DIAGNOSIS: Esophageal Cancer   Oncology History Overview Note  Cancer Staging Malignant neoplasm of middle third of esophagus [facility]) Staging form: Esophagus - Squamous Cell Carcinoma, AJCC 8th Edition - Clinical: Stage Unknown (cTX, cN1, cM0) - Signed by [name], MD on 08/18/2019    Malignant neoplasm of middle third of esophagus [facility])   Initial Diagnosis   Malignant neoplasm of middle third of esophagus ([place])   08/11/2019 Imaging   CT CAP W contrast 08/11/19 IMPRESSION: 1. Mid to lower esophageal mass along an 8 cm segment, large endoluminal component, strongly favoring esophageal malignancy. Borderline enlarged AP window lymph node. No findings of distant metastatic disease. 2. Trace bilateral pleural effusions. 3. Diffuse hepatic steatosis. 4. Cholelithiasis. 5. Fatty spermatic cords likely due to small bilateral indirect inguinal hernias.   08/11/2019 Procedure   EGD by Dr Carlean Purl  IMPRESSION - Partially obstructing, likely malignant esophageal tumor was found in the upper third of the esophagus and in the middle third of the esophagus. Biopsied. Very large and long - difficult but able to advance scope by this lesion - await CT for better length estimate - Normal stomach. - Normal examined duodenum.   08/11/2019 Initial Biopsy   FINAL MICROSCOPIC DIAGNOSIS:   A. ESOPHAGUS, UPPER, BIOPSY:  - Squamous cell carcinoma.    08/11/2019 Genetic Testing   PD-L1 Expression 90%   08/18/2019 Cancer Staging   Staging form: Esophagus - Squamous Cell Carcinoma, AJCC 8th Edition - Clinical: Stage Unknown (cTX, cN1, cM0) - Signed by [name], MD on 08/18/2019    08/29/2019 - 10/04/2019 Chemotherapy   Concurrent chemoradiation with weekly CT for 6 weeks starting 08/29/19-10/04/19    08/29/2019 - 10/06/2019 Radiation Therapy   Concurrent chemoradiation with Dr Lisbeth Renshaw starting  08/29/19-10/06/19   11/11/2019 PET scan   IMPRESSION: 1. Again seen is a long segment of FDG avid wall thickening of the distal half of the thoracic esophagus compatible with known esophageal neoplasm. 2. No signs of FDG avid nodal or distant metastatic disease.     11/28/2019 Procedure   EUS by Dr Rush Landmark IMPRESSION EGD Impression: - No gross lesions in esophagus proximally. - Previously noted esophageal cancer is not present. However, in its place and extending distal to this is likely LA Grade D chemotherapy/radiation esophagitis with bleeding. Biopsied after the EUS was complete to evaluate for persistent disease or not. - Z-line regular, 37 cm from the incisors. - 3 cm hiatal hernia. - Erythematous mucosa in the stomach. No other gross lesions in the stomach. Biopsied. - No gross lesions in the duodenal bulb, in the first portion of the duodenum and in the second portion of the duodenum. EUS Impression: - Wall thickening was seen in the thoracic esophagus into the gastroesophageal junction. The thickening appeared to primarily be within the luminal interface/superficial mucosa (Layer 1) and deep mucosa (Layer 2). The previously noted esophageal cancer has been replaced by what appears to be radiation/chemotherapy induced changes. With the limitations of having to use the mini-probe EUS, I could not visualize persistent significant disease other than the entire distal area of the esophagus having likely changes noted as above. - No malignant-appearing lymph nodes were visualized in the middle paraesophageal mediastinum (level 18M), lower paraesophageal mediastinum (level 8L), diaphragmatic region (level 15) and paracardial region (level 16).      FINAL MICROSCOPIC DIAGNOSIS:   A. STOMACH, BIOPSY:  - Mildly  active chronic gastritis.  - Warthin-Starry negative for Helicobacter pylori.  - No intestinal metaplasia, dysplasia or carcinoma.   B. ESOPHAGUS, BIOPSY:  -  Inflamed granulation tissue and exudate consistent with ulcer.  - No malignancy identified.    12/20/2019 - 02/2020 Chemotherapy   CAPOX q3weeks with Xeloda 157m BID 2 weeks on/1 week off starting 12/20/19-02/2020   03/21/2020 Imaging   [facility]--Circumferential thickening of the lower third of the esophagus which corresponds with known esophageal malignancy. No evidence of metastatic disease within the chest.  --For findings below the diaphragm, please see concurrent separately reported CT of the abdomen and pelvis.  --Expansile lytic mass with associated soft tissue component centered in the left sacral ala and abutting the left S1 and S2 nerve roots, left internal iliac vasculature, and left pyriformis muscle with no other metastatic disease within the abdomen or pelvis.   --Diffuse thickening of the distal esophagus compatible with known esophageal malignancy.   --Cholelithiasis with nonspecific gallbladder distention. Clinical correlation is recommended. If there is clinical concern for cholecystitis, further evaluation with ultrasound can be obtained.   03/21/2020 Imaging   CT Lumbar Spine  Expansile lytic lesion centered in the left sacral ala with no other evidence of metastatic disease in the lumbar spine.   03/26/2020 - 04/10/2020 Radiation Therapy   Palliative Radiation to Sacrum with Dr MLisbeth Renshaw2/28/22-3/15/22.    04/11/2020 Progression   PET  IMPRESSION: 1. New hypermetabolic expansile 6.1 cm upper left sacral lytic bone metastasis, centrally necrotic. 2. New hypermetabolic left level 1b and left level 2 neck lymph nodes, indeterminate for reactive versus metastatic nodes. 3. Persistent hypermetabolism associated with a short segment of mid to lower thoracic esophagus with associated circumferential esophageal wall thickening, similar in metabolism and decreased in wall thickness since 11/11/2019 PET-CT, nonspecific, favor post treatment change, with residual neoplasm  not excluded. 4. No additional sites of hypermetabolic metastatic disease. 5.  Chronic findings include: Cholelithiasis.   04/27/2020 -  Chemotherapy   First-line with immunotherapy Keytruda q3weeks starting 04/27/20       06/29/2020 -  Chemotherapy   Weekly carbo AUC 1.5 and Taxol 652mm2 on day 1, 8 every 21 days added to first-line Keytruda from C4 on 06/29/20.     07/17/2020 PET scan   IMPRESSION: 1. Left sacral metastasis is slightly enlarged and slightly increased in peripheral hypermetabolism. No additional evidence of metastatic disease. 2. Similar mild hypermetabolism associated with the mid esophagus. No residual hypermetabolism within left neck lymph nodes. 3. Small pericardial effusion. 4. Trace bilateral pleural effusions. 5. Cholelithiasis.       CURRENT THERAPY: First-line Keytruda q3weeks starting 04/27/20, added weekly carbo AUC 1.5 and Taxol 6059m2 from cycle 4 (06/29/20) on day 1, 8 every 21 days. Today is Day 1 C5.   INTERVAL HISTORY: Jimmy Hawkins 59o. male returns to the clinic today for follow-up visit by his interpreter and niece MerDewitt HoesThe patient is currently undergoing treatment for esophageal cancer.  At his last appointment, the patient was endorsing some increased throat tightness to solid foods. Dr. FenBurr Medicoached out to his gastroenterologist. The patient does not have any follow up appointments scheduled. He notes some progressive symptoms of dysphagia with solid foods where the food feels like it is getting "stuck". He lost about 4 lbs since his last appointment. He also is only drinking 1 ensure per day. He states he was previously evaluated by a member of the nutritionist team. At the patient's  last appointment, she also referred the patient to palliative care to Dr. Hilma Favors.  Overall, the patient is feeling similar today and the main concerns are the issues with swallowing and associated weight loss.  He has been having some fatigue. He  previously was having some nausea and vomiting and diarrhea. This has resolved at this time. The patient has some stable sacral pain for which he takes MS Contin reportedly once per day. He also was prescribed Hycet as well which he takes once a day as well reportedly. It takes his pain from an 8 to around a 5. Otherwise, the patient denies any recent fever or chills, or night sweats. He denies abdominal pain. He denies skin infections or dysuria. Denies URI symptoms.  He denies any rashes or skin changes. He denies peripheral neuropathy. He just notes intermittent food cramps in his left foot. The patient is here today for evaluation before starting his next cycle of treatment.     MEDICAL HISTORY: Past Medical History:  Diagnosis Date   GERD (gastroesophageal reflux disease)    Hematemesis with nausea 08/10/2019    ALLERGIES:  has No Known Allergies.  MEDICATIONS:  Current Outpatient Medications  Medication Sig Dispense Refill   diphenhydrAMINE-APAP, sleep, (TYLENOL PM EXTRA STRENGTH) 50-1000 MG/30ML LIQD Take 15 mLs by mouth at bedtime as needed (sleep).     famotidine (PEPCID) 20 MG tablet TOME 1 PASTILLA POR VIA ORAL DOS VECES AL DIA 60 tablet 3   Ferrous Sulfate (IRON) 325 (65 Fe) MG TABS Take 1 tablet (325 mg total) by mouth 2 (two) times daily. 60 tablet 3   ferrous sulfate 325 (65 FE) MG tablet TAKE 1 TABLET BY MOUTH 2 TIMES DAILY. 60 tablet 3   gabapentin (NEURONTIN) 300 MG capsule Take 1 capsule (300 mg total) by mouth 3 (three) times daily. 90 capsule 0   HYDROcodone-acetaminophen (HYCET) 7.5-325 mg/15 ml solution Take 10 mLs by mouth every 8 (eight) hours as needed for moderate pain. Please do not fill until 07/12/2020 473 mL 0   mirtazapine (REMERON) 7.5 MG tablet TOME 1 PASTILLA POR VIA ORAL A LA HORA DE ACOSTARSE 30 tablet 0   morphine (MS CONTIN) 30 MG 12 hr tablet Take 1 tablet (30 mg total) by mouth every 8 (eight) hours. 90 tablet 0   prochlorperazine (COMPAZINE) 10 MG  tablet Take 1 tablet (10 mg total) by mouth every 6 (six) hours as needed for nausea or vomiting. 60 tablet 2   sucralfate (CARAFATE) 1 g tablet Take 1 tablet (1 g total) by mouth 4 (four) times daily. Dissolve each tablet in 15 cc water before use. 120 tablet 2   No current facility-administered medications for this visit.   Facility-Administered Medications Ordered in Other Visits  Medication Dose Route Frequency Provider Last Rate Last Admin   CARBOplatin (PARAPLATIN) 150 mg in sodium chloride 0.9 % 100 mL chemo infusion  150 mg Intravenous Once [name], MD       PACLitaxel (TAXOL) 114 mg in sodium chloride 0.9 % 250 mL chemo infusion (</= 110m/m2)  60 mg/m2 (Treatment Plan Recorded) Intravenous Once FTruitt Merle MD 269 mL/hr at 08/10/20 1235 114 mg at 08/10/20 1235    SURGICAL HISTORY:  Past Surgical History:  Procedure Laterality Date   BIOPSY  08/11/2019   Procedure: BIOPSY;  Surgeon: GGatha Mayer MD;  [place] [facility]  Service: Endoscopy;;   BIOPSY  11/28/2019   Procedure: BIOPSY;  Surgeon: MIrving Copas, MD;  [facility];  Service: Gastroenterology;;   ESOPHAGOGASTRODUODENOSCOPY  08/11/2019   ESOPHAGOGASTRODUODENOSCOPY (EGD) WITH PROPOFOL N/A 08/11/2019   Procedure: ESOPHAGOGASTRODUODENOSCOPY (EGD) WITH PROPOFOL;  Surgeon: [name], MD;  Location: [place];  Service: Endoscopy;  Laterality: N/A;   ESOPHAGOGASTRODUODENOSCOPY (EGD) WITH PROPOFOL N/A 11/28/2019   Procedure: ESOPHAGOGASTRODUODENOSCOPY (EGD) WITH PROPOFOL;  Surgeon: Rush Landmark Telford Nab., MD;  Location: [place];  Service: Gastroenterology;  Laterality: N/A;   EUS N/A 11/28/2019   Procedure: UPPER ENDOSCOPIC ULTRASOUND (EUS) RADIAL;  Surgeon: [name], MD;  Location: [place];  Service: Gastroenterology;  Laterality: N/A;    REVIEW OF SYSTEMS:   Review of Systems  Constitutional: Positive for fatigue, appetite change, and weight loss. Negative for chills  and fever.  HENT: Positive for dysphagia with solids. Negative for mouth sores, nosebleeds, sore throat and trouble swallowing.   Eyes: Negative for eye problems and icterus.  Respiratory: Negative for cough, hemoptysis, shortness of breath and wheezing.   Cardiovascular: Negative for chest pain and leg swelling.  Gastrointestinal: Negative for abdominal pain, constipation, diarrhea, nausea and vomiting.  Genitourinary: Negative for bladder incontinence, difficulty urinating, dysuria, frequency and hematuria.   Musculoskeletal: Positive for sacral pain. Negative for gait problem, neck pain and neck stiffness.  Skin: Negative for itching and rash.  Neurological: Negative for dizziness, extremity weakness, gait problem, headaches, light-headedness and seizures.  Hematological: Negative for adenopathy. Does not bruise/bleed easily.  Psychiatric/Behavioral: Negative for confusion, depression and sleep disturbance. The patient is not nervous/anxious.     PHYSICAL EXAMINATION:  Blood pressure 112/72, pulse 94, resp. rate 18, height 5' 6" (1.676 m), weight 160 lb 14.4 oz (73 kg), SpO2 98 %.  ECOG PERFORMANCE STATUS: 1  Physical Exam  Constitutional: Oriented to person, place, and time and well-developed, well-nourished, and in no distress.  HENT:  Head: Normocephalic and atraumatic.  Mouth/Throat: Oropharynx is clear and moist. No oropharyngeal exudate.  Eyes: Conjunctivae are normal. Right eye exhibits no discharge. Left eye exhibits no discharge. No scleral icterus.  Neck: Normal range of motion. Neck supple.  Cardiovascular: Normal rate, regular rhythm, normal heart sounds and intact distal pulses.   Pulmonary/Chest: Effort normal and breath sounds normal. No respiratory distress. No wheezes. No rales.  Abdominal: Soft. Bowel sounds are normal. Exhibits no distension and no mass. There is no tenderness.  Musculoskeletal: Normal range of motion. Exhibits no edema.  Lymphadenopathy:    No  cervical adenopathy.  Neurological: Alert and oriented to person, place, and time. Exhibits normal muscle tone. Gait normal. Ambulates with a walker. Skin: Skin is warm and dry. No rash noted. Not diaphoretic. No erythema. No pallor.  Psychiatric: Mood, memory and judgment normal.  Vitals reviewed.  LABORATORY DATA: Lab Results  Component Value Date   WBC 2.4 (L) 08/10/2020   HGB 13.9 08/10/2020   HCT 39.4 08/10/2020   MCV 91.2 08/10/2020   PLT 163 08/10/2020      Chemistry      Component Value Date/Time   NA 138 08/10/2020 1009   K 3.6 08/10/2020 1009   CL 105 08/10/2020 1009   CO2 24 08/10/2020 1009   BUN <4 (L) 08/10/2020 1009   CREATININE 0.63 08/10/2020 1009      Component Value Date/Time   CALCIUM 9.1 08/10/2020 1009   ALKPHOS 82 08/10/2020 1009   AST 23 08/10/2020 1009   ALT 11 08/10/2020 1009   BILITOT 0.7 08/10/2020 1009       RADIOGRAPHIC STUDIES:  NM PET Image Restag (PS) Skull  Base To Thigh  Result Date: 07/18/2020 CLINICAL DATA:  Subsequent treatment strategy for gastrointestinal cancer. EXAM: NUCLEAR MEDICINE PET SKULL BASE TO THIGH TECHNIQUE: 7.9 mCi F-18 FDG was injected intravenously. Full-ring PET imaging was performed from the skull base to thigh after the radiotracer. CT data was obtained and used for attenuation correction and anatomic localization. Fasting blood glucose: 106 mg/dl COMPARISON:  MR cervical and thoracic spine 05/26/2020, PET 04/11/2020. FINDINGS: Mediastinal blood pool activity: SUV max 2.1 Liver activity: SUV max NA NECK: No abnormal hypermetabolism. Incidental CT findings: None. CHEST: Persistent mild hypermetabolism associated with the mid esophagus, SUV max 4.6, similar. No hypermetabolic mediastinal, hilar or axillary lymph nodes. No hypermetabolic pulmonary nodules. Incidental CT findings: Heart is enlarged. Small amount of pericardial fluid, increased from 04/11/2020. Trace bilateral pleural effusions. Volume loss in the medial right  lower lobe. ABDOMEN/PELVIS: No abnormal hypermetabolism in the liver, adrenal glands, spleen or pancreas. No hypermetabolic lymph nodes. Incidental CT findings: Liver is unremarkable. Stones are seen in the gallbladder. Adrenal glands, kidneys, spleen, pancreas, stomach and bowel are unremarkable. SKELETON: Expansile lytic mass in the left sacrum has enlarged slightly, now measuring 4.9 x 7.2 cm, compared to 4.1 x 6.1 cm on 04/11/2020. Mildly increased hypermetabolism peripherally, with an SUV max of 9.2, compared to 6.8 previously. No additional abnormal hypermetabolism. Incidental CT findings: None. IMPRESSION: 1. Left sacral metastasis is slightly enlarged and slightly increased in peripheral hypermetabolism. No additional evidence of metastatic disease. 2. Similar mild hypermetabolism associated with the mid esophagus. No residual hypermetabolism within left neck lymph nodes. 3. Small pericardial effusion. 4. Trace bilateral pleural effusions. 5. Cholelithiasis. Electronically Signed   By: Lorin Picket M.D.   On: 07/18/2020 09:17     ASSESSMENT/PLAN:  Yeng Perz is a 45 y.o. male with     1. 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 08/11/19 CT CAP found him to have mid esophageal mass and enlarged AP window node, no distant metastasis. -He completed 6 weeks of concurrent chemoRT with weekly Carboplatin and Taxol 08/29/19-10/06/19 -Repeat PET scan October 2021 showed hypermetabolic wall thickening of the distal half of the thoracic esophageus, no notable distant metastasis. Dr. Kipp Brood has reviewed the PET scan, and feels that his tumor invades trachea is it's not resectable. He will f/u with Dr Kipp Brood on 01/06/20. -He completed consolidation chemotherapy with CAPOX q3weeks with Xeloda 1555m BID 2 weeks on/1 week off starting  12/20/19-02/2020. -He completed palliative Radiation to sacrum with Dr MLisbeth Renshaw2/28/22-3/15/22.  -He has started First-line single agent Keytruda 04/27/20. Due to his slow improvement and squamous cell histology, Dr. FBurr Medicoadded chemo with carboplatin and Taxol 2 weeks on/1 week off from C4 on 06/29/20. -His PET from 07/17/20 showed Left sacral metastasis is slightly enlarged and slightly increased in peripheral hypermetabolism. No additional evidence of metastatic disease. His cancer is controlled but did not have significant response from [place]. Will continue chemotherapy as well. -He did not tolerate C1D8 well with vomiting and diarrhea and fatigue. He was able to recover on his week off. -Labs reviewed and adequate except for WBC 2.4 and ANC of 1.3. Reviewed with Dr. FBurr Medico He will proceed with Keytruda and C5D1 CT today. Dr. FBurr Medicowill dose reduce carboplatin to AUC of 1 given the ANC of 1.3. We will work on obtaining drug assistance for GCSF (zarxio) which will likely be administered next  week on 08/15/20. Zarxio will be given PRN for neutropenia; however, with the next cycle of treatment, we will tentatively schedule injection appointments for a dose of Zarxio if needed. -Will proceed with Day 1 C5 Carbo and Taxol and Keytruda today. CT only next week on 08/17/20.  -f/u 3 weeks. He can contact us sooner if he has any concerns.    2. Sacral/back pain, LE weakness, secondary to bone metastasis -He was seen to have bone mets on 04/11/20 PET scan. -He completed palliative Radiation to sacrum with Dr Lisbeth Renshaw 03/26/20-04/10/20. He was given short course dexa 24m.  -He is on MS Contin 10cc BID and hydrocodone as needed for breakthrough pain. Dr. FBurr Medicopreviously reviewed higher dose narcotics will not resolve his pain and will increase risk of addiction. Dr. FBurr Medicopreviously reviewed controlled use of pain medication and that she will not refill sooner than planned.  -The patient reportedly has only been taking  each once a day. Reviewed dosing of morphine is BID if needed. Hycet every 8 hours if needed.    3. H/o Heavy alcohol user -He quit drinking since cancer diagnosis, but started again. Due to weakness, he is not able to drink as much since 04/13/20 and has stopped overall (06/29/20)   4. Social and FAcupuncturist -Patient does not have insurance.  -He has met with financial advocate Shauna and SW and he has grant assistance with medications. -Will see if he can get drug replacement for Keytruda. -Patient, family and Interpretor notes difficulty with communication. They will set up Mychart for more direct line.  -He will continue to f/u with SW for support   5. Dysphagia and weight loss -Drinks 1 supplemental drink per day -Encouraged to drink 3-4 supplemental drinks per day -Will reach out to GI to see if he needs to be evaluated sooner for progressive dysphagia with solid foods. No obstructive symptoms. No nausea/vomiting recently.    PLAN:  -Labs reviewed, adequate to proceed with Keytruda and C5D1 CT today. Dose of carboplatin reduced to AUC of 1 due to neutropenia -Signature obtained for drug assistance application for Zarxio. This will be given PRN for neutropenia but per Dr. FBurr Medico we can arrange for tentative injection appointments on day 9 of each cycle moving forward. We will plan for him to receive it though this cycle on 7/20 so he is not neutropenic for day 8 of treatment scheduled for 08/17/20.  -lab, Carboplatin and Taxol in 1, 3, 4 weeks.  -f/u, chemo and Keytruda in 3 weeks.  -Reached out to GI, appointment made in August 2022.           No orders of the defined types were placed in this encounter.     The total time spent in the appointment was 30-39 minutes   L , PA-C 08/10/20

## 2020-08-10 ENCOUNTER — Other Ambulatory Visit: Payer: Self-pay

## 2020-08-10 ENCOUNTER — Telehealth: Payer: Self-pay | Admitting: Internal Medicine

## 2020-08-10 ENCOUNTER — Inpatient Hospital Stay: Payer: Self-pay

## 2020-08-10 ENCOUNTER — Encounter: Payer: Self-pay | Admitting: Physician Assistant

## 2020-08-10 ENCOUNTER — Inpatient Hospital Stay (HOSPITAL_BASED_OUTPATIENT_CLINIC_OR_DEPARTMENT_OTHER): Payer: Self-pay | Admitting: Physician Assistant

## 2020-08-10 ENCOUNTER — Telehealth: Payer: Self-pay

## 2020-08-10 VITALS — BP 112/72 | HR 94 | Resp 18 | Ht 66.0 in | Wt 160.9 lb

## 2020-08-10 DIAGNOSIS — D702 Other drug-induced agranulocytosis: Secondary | ICD-10-CM

## 2020-08-10 DIAGNOSIS — C154 Malignant neoplasm of middle third of esophagus: Secondary | ICD-10-CM

## 2020-08-10 LAB — CBC WITH DIFFERENTIAL (CANCER CENTER ONLY)
Abs Immature Granulocytes: 0.03 10*3/uL (ref 0.00–0.07)
Basophils Absolute: 0 10*3/uL (ref 0.0–0.1)
Basophils Relative: 1 %
Eosinophils Absolute: 0 10*3/uL (ref 0.0–0.5)
Eosinophils Relative: 1 %
HCT: 39.4 % (ref 39.0–52.0)
Hemoglobin: 13.9 g/dL (ref 13.0–17.0)
Immature Granulocytes: 1 %
Lymphocytes Relative: 21 %
Lymphs Abs: 0.5 10*3/uL — ABNORMAL LOW (ref 0.7–4.0)
MCH: 32.2 pg (ref 26.0–34.0)
MCHC: 35.3 g/dL (ref 30.0–36.0)
MCV: 91.2 fL (ref 80.0–100.0)
Monocytes Absolute: 0.5 10*3/uL (ref 0.1–1.0)
Monocytes Relative: 20 %
Neutro Abs: 1.3 10*3/uL — ABNORMAL LOW (ref 1.7–7.7)
Neutrophils Relative %: 56 %
Platelet Count: 163 10*3/uL (ref 150–400)
RBC: 4.32 MIL/uL (ref 4.22–5.81)
RDW: 13.4 % (ref 11.5–15.5)
WBC Count: 2.4 10*3/uL — ABNORMAL LOW (ref 4.0–10.5)
nRBC: 0 % (ref 0.0–0.2)

## 2020-08-10 LAB — CMP (CANCER CENTER ONLY)
ALT: 11 U/L (ref 0–44)
AST: 23 U/L (ref 15–41)
Albumin: 3.6 g/dL (ref 3.5–5.0)
Alkaline Phosphatase: 82 U/L (ref 38–126)
Anion gap: 9 (ref 5–15)
BUN: <4 mg/dL — ABNORMAL LOW (ref 6–20)
CO2: 24 mmol/L (ref 22–32)
Calcium: 9.1 mg/dL (ref 8.9–10.3)
Chloride: 105 mmol/L (ref 98–111)
Creatinine: 0.63 mg/dL (ref 0.61–1.24)
GFR, Estimated: >60 mL/min (ref >60)
Glucose, Bld: 110 mg/dL — ABNORMAL HIGH (ref 70–99)
Potassium: 3.6 mmol/L (ref 3.5–5.1)
Sodium: 138 mmol/L (ref 135–145)
Total Bilirubin: 0.7 mg/dL (ref 0.3–1.2)
Total Protein: 7.4 g/dL (ref 6.5–8.1)

## 2020-08-10 MED ORDER — SODIUM CHLORIDE 0.9 % IV SOLN
Freq: Once | INTRAVENOUS | Status: AC
  Filled 2020-08-10: qty 250

## 2020-08-10 MED ORDER — FAMOTIDINE 20 MG IN NS 100 ML IVPB
INTRAVENOUS | Status: AC
  Filled 2020-08-10: qty 100

## 2020-08-10 MED ORDER — SODIUM CHLORIDE 0.9 % IV SOLN
148.6000 mg | Freq: Once | INTRAVENOUS | Status: AC
  Administered 2020-08-10: 150 mg via INTRAVENOUS
  Filled 2020-08-10: qty 15

## 2020-08-10 MED ORDER — DIPHENHYDRAMINE HCL 50 MG/ML IJ SOLN
50.0000 mg | Freq: Once | INTRAMUSCULAR | Status: AC
  Administered 2020-08-10: 50 mg via INTRAVENOUS

## 2020-08-10 MED ORDER — SODIUM CHLORIDE 0.9 % IV SOLN
200.0000 mg | Freq: Once | INTRAVENOUS | Status: AC
  Administered 2020-08-10: 200 mg via INTRAVENOUS
  Filled 2020-08-10: qty 8

## 2020-08-10 MED ORDER — SODIUM CHLORIDE 0.9 % IV SOLN
60.0000 mg/m2 | Freq: Once | INTRAVENOUS | Status: AC
  Administered 2020-08-10: 114 mg via INTRAVENOUS
  Filled 2020-08-10: qty 19

## 2020-08-10 MED ORDER — FAMOTIDINE 20 MG IN NS 100 ML IVPB
20.0000 mg | Freq: Once | INTRAVENOUS | Status: AC
  Administered 2020-08-10: 20 mg via INTRAVENOUS

## 2020-08-10 MED ORDER — DIPHENHYDRAMINE HCL 50 MG/ML IJ SOLN
INTRAMUSCULAR | Status: AC
  Filled 2020-08-10: qty 1

## 2020-08-10 MED ORDER — PALONOSETRON HCL INJECTION 0.25 MG/5ML
0.2500 mg | Freq: Once | INTRAVENOUS | Status: AC
  Administered 2020-08-10: 0.25 mg via INTRAVENOUS

## 2020-08-10 MED ORDER — PALONOSETRON HCL INJECTION 0.25 MG/5ML
INTRAVENOUS | Status: AC
  Filled 2020-08-10: qty 5

## 2020-08-10 NOTE — Telephone Encounter (Signed)
Per Cassandra PA-C, pt c/o trouble w/swallowing solid foods and wants pt seen by his GI if possible.  I have called [facility] and scheduled the pt their next available date. The appt is as follows:  Alen Blew, PA-C August 11th at 10am  Pt has been given this information.

## 2020-08-10 NOTE — Telephone Encounter (Signed)
Scheduled appointment per 07/15 sch msg. Patient is aware. 

## 2020-08-10 NOTE — Progress Notes (Signed)
Ok to treat with low anc today per provider

## 2020-08-14 ENCOUNTER — Encounter: Payer: Self-pay | Admitting: Physician Assistant

## 2020-08-14 ENCOUNTER — Other Ambulatory Visit (HOSPITAL_COMMUNITY): Payer: Self-pay

## 2020-08-14 ENCOUNTER — Other Ambulatory Visit: Payer: Self-pay | Admitting: Hematology

## 2020-08-14 ENCOUNTER — Encounter: Payer: Self-pay | Admitting: Hematology

## 2020-08-14 MED ORDER — MORPHINE SULFATE ER 30 MG PO TBCR
30.0000 mg | EXTENDED_RELEASE_TABLET | Freq: Three times a day (TID) | ORAL | 0 refills | Status: DC
  Filled 2020-08-14: qty 90, 30d supply, fill #0

## 2020-08-14 MED ORDER — HYDROCODONE-ACETAMINOPHEN 7.5-325 MG/15ML PO SOLN
10.0000 mL | Freq: Three times a day (TID) | ORAL | 0 refills | Status: DC | PRN
  Filled 2020-08-14: qty 473, 16d supply, fill #0

## 2020-08-14 NOTE — Progress Notes (Signed)
..  The following Medication: Lesleigh Noe has been approved thru [employer] as Risk manager. Enrollment period is 08/13/2020 to 08/13/2021.  Assistance ID: [id number].  Reason for Assistance: Self Pay First DOS: 08/15/2020.

## 2020-08-15 ENCOUNTER — Other Ambulatory Visit: Payer: Self-pay

## 2020-08-15 ENCOUNTER — Inpatient Hospital Stay: Payer: Self-pay

## 2020-08-15 VITALS — BP 100/78 | HR 105 | Temp 99.2°F | Resp 18

## 2020-08-15 DIAGNOSIS — D500 Iron deficiency anemia secondary to blood loss (chronic): Secondary | ICD-10-CM

## 2020-08-15 DIAGNOSIS — D702 Other drug-induced agranulocytosis: Secondary | ICD-10-CM

## 2020-08-15 MED ORDER — FILGRASTIM-SNDZ 300 MCG/0.5ML IJ SOSY
PREFILLED_SYRINGE | INTRAMUSCULAR | Status: AC
  Filled 2020-08-15: qty 0.5

## 2020-08-15 MED ORDER — FILGRASTIM-SNDZ 300 MCG/0.5ML IJ SOSY
300.0000 ug | PREFILLED_SYRINGE | Freq: Once | INTRAMUSCULAR | Status: AC
  Administered 2020-08-15: 300 ug via SUBCUTANEOUS

## 2020-08-17 ENCOUNTER — Other Ambulatory Visit: Payer: Self-pay

## 2020-08-17 ENCOUNTER — Other Ambulatory Visit: Payer: Self-pay | Admitting: Hematology

## 2020-08-17 ENCOUNTER — Inpatient Hospital Stay: Payer: Self-pay

## 2020-08-17 VITALS — BP 105/74 | HR 93 | Temp 98.5°F | Resp 18 | Ht 66.0 in | Wt 158.8 lb

## 2020-08-17 DIAGNOSIS — C154 Malignant neoplasm of middle third of esophagus: Secondary | ICD-10-CM

## 2020-08-17 LAB — CMP (CANCER CENTER ONLY)
ALT: 18 U/L (ref 0–44)
AST: 22 U/L (ref 15–41)
Albumin: 3.7 g/dL (ref 3.5–5.0)
Alkaline Phosphatase: 78 U/L (ref 38–126)
Anion gap: 12 (ref 5–15)
BUN: 6 mg/dL (ref 6–20)
CO2: 24 mmol/L (ref 22–32)
Calcium: 9.3 mg/dL (ref 8.9–10.3)
Chloride: 101 mmol/L (ref 98–111)
Creatinine: 0.6 mg/dL — ABNORMAL LOW (ref 0.61–1.24)
GFR, Estimated: >60 mL/min (ref >60)
Glucose, Bld: 116 mg/dL — ABNORMAL HIGH (ref 70–99)
Potassium: 3.7 mmol/L (ref 3.5–5.1)
Sodium: 137 mmol/L (ref 135–145)
Total Bilirubin: 0.7 mg/dL (ref 0.3–1.2)
Total Protein: 7.6 g/dL (ref 6.5–8.1)

## 2020-08-17 LAB — CBC WITH DIFFERENTIAL (CANCER CENTER ONLY)
Abs Immature Granulocytes: 0.03 10*3/uL (ref 0.00–0.07)
Basophils Absolute: 0 10*3/uL (ref 0.0–0.1)
Basophils Relative: 0 %
Eosinophils Absolute: 0 10*3/uL (ref 0.0–0.5)
Eosinophils Relative: 1 %
HCT: 35.7 % — ABNORMAL LOW (ref 39.0–52.0)
Hemoglobin: 12.7 g/dL — ABNORMAL LOW (ref 13.0–17.0)
Immature Granulocytes: 1 %
Lymphocytes Relative: 13 %
Lymphs Abs: 0.7 10*3/uL (ref 0.7–4.0)
MCH: 32.3 pg (ref 26.0–34.0)
MCHC: 35.6 g/dL (ref 30.0–36.0)
MCV: 90.8 fL (ref 80.0–100.0)
Monocytes Absolute: 0.5 10*3/uL (ref 0.1–1.0)
Monocytes Relative: 9 %
Neutro Abs: 4 10*3/uL (ref 1.7–7.7)
Neutrophils Relative %: 76 %
Platelet Count: 144 10*3/uL — ABNORMAL LOW (ref 150–400)
RBC: 3.93 MIL/uL — ABNORMAL LOW (ref 4.22–5.81)
RDW: 13.3 % (ref 11.5–15.5)
WBC Count: 5.2 10*3/uL (ref 4.0–10.5)
nRBC: 0 % (ref 0.0–0.2)

## 2020-08-17 MED ORDER — DIPHENHYDRAMINE HCL 50 MG/ML IJ SOLN
INTRAMUSCULAR | Status: AC
  Filled 2020-08-17: qty 1

## 2020-08-17 MED ORDER — SODIUM CHLORIDE 0.9 % IV SOLN
Freq: Once | INTRAVENOUS | Status: AC
  Filled 2020-08-17: qty 250

## 2020-08-17 MED ORDER — SODIUM CHLORIDE 0.9 % IV SOLN
60.0000 mg/m2 | Freq: Once | INTRAVENOUS | Status: AC
  Administered 2020-08-17: 114 mg via INTRAVENOUS
  Filled 2020-08-17: qty 19

## 2020-08-17 MED ORDER — PALONOSETRON HCL INJECTION 0.25 MG/5ML
0.2500 mg | Freq: Once | INTRAVENOUS | Status: AC
  Administered 2020-08-17: 0.25 mg via INTRAVENOUS

## 2020-08-17 MED ORDER — CARBOPLATIN CHEMO INJECTION 450 MG/45ML
222.9000 mg | Freq: Once | INTRAVENOUS | Status: AC
  Administered 2020-08-17: 220 mg via INTRAVENOUS
  Filled 2020-08-17: qty 22

## 2020-08-17 MED ORDER — PALONOSETRON HCL INJECTION 0.25 MG/5ML
INTRAVENOUS | Status: AC
  Filled 2020-08-17: qty 5

## 2020-08-17 MED ORDER — FAMOTIDINE 20 MG IN NS 100 ML IVPB
INTRAVENOUS | Status: AC
  Filled 2020-08-17: qty 100

## 2020-08-17 MED ORDER — FAMOTIDINE 20 MG IN NS 100 ML IVPB
20.0000 mg | Freq: Once | INTRAVENOUS | Status: AC
  Administered 2020-08-17: 20 mg via INTRAVENOUS

## 2020-08-17 MED ORDER — DIPHENHYDRAMINE HCL 50 MG/ML IJ SOLN
50.0000 mg | Freq: Once | INTRAMUSCULAR | Status: AC
  Administered 2020-08-17: 50 mg via INTRAVENOUS

## 2020-08-17 NOTE — Patient Instructions (Signed)
[facility]  Discharge Instructions: Thank you for choosing [facility] to provide your oncology and hematology care.   If you have a lab appointment with the Cancer Center, please go directly to the Cancer Center and check in at the registration area.   Wear comfortable clothing and clothing appropriate for easy access to any Portacath or PICC line.   We strive to give you quality time with your provider. You may need to reschedule your appointment if you arrive late (15 or more minutes).  Arriving late affects you and other patients whose appointments are after yours.  Also, if you miss three or more appointments without notifying the office, you may be dismissed from the clinic at the provider's discretion.      For prescription refill requests, have your pharmacy contact our office and allow 72 hours for refills to be completed.    Today you received the following chemotherapy and/or immunotherapy agents taxol/carboplatin     To help prevent nausea and vomiting after your treatment, we encourage you to take your nausea medication as directed.  BELOW ARE SYMPTOMS THAT SHOULD BE REPORTED IMMEDIATELY: *FEVER GREATER THAN 100.4 F (38 C) OR HIGHER *CHILLS OR SWEATING *NAUSEA AND VOMITING THAT IS NOT CONTROLLED WITH YOUR NAUSEA MEDICATION *UNUSUAL SHORTNESS OF BREATH *UNUSUAL BRUISING OR BLEEDING *URINARY PROBLEMS (pain or burning when urinating, or frequent urination) *BOWEL PROBLEMS (unusual diarrhea, constipation, pain near the anus) TENDERNESS IN MOUTH AND THROAT WITH OR WITHOUT PRESENCE OF ULCERS (sore throat, sores in mouth, or a toothache) UNUSUAL RASH, SWELLING OR PAIN  UNUSUAL VAGINAL DISCHARGE OR ITCHING   Items with * indicate a potential emergency and should be followed up as soon as possible or go to the Emergency Department if any problems should occur.  Please show the CHEMOTHERAPY ALERT CARD or IMMUNOTHERAPY ALERT CARD at  check-in to the Emergency Department and triage nurse.  Should you have questions after your visit or need to cancel or reschedule your appointment, please contact [facility]  Dept: [phone]  and follow the prompts.  Office hours are 8:00 a.m. to 4:30 p.m. Monday - Friday. Please note that voicemails left after 4:00 p.m. may not be returned until the following business day.  We are closed weekends and major holidays. You have access to a nurse at all times for urgent questions. Please call the main number to the clinic Dept: [phone] and follow the prompts.   For any non-urgent questions, you may also contact your provider using MyChart. We now offer e-Visits for anyone 18 and older to request care online for non-urgent symptoms. For details visit [email or web address].   Also download the MyChart app! Go to the app store, search "MyChart", open the app, select , and log in with your MyChart username and password.  Due to Covid, a mask is required upon entering the hospital/clinic. If you do not have a mask, one will be given to you upon arrival. For doctor visits, patients may have 1 support person aged 18 or older with them. For treatment visits, patients cannot have anyone with them due to current Covid guidelines and our immunocompromised population.   

## 2020-08-17 NOTE — Progress Notes (Signed)
Spoke with MD Burr Medico. She would like to continue Carboplatin dosing with AUC =1.5. ANC has improved.

## 2020-08-30 ENCOUNTER — Other Ambulatory Visit: Payer: Self-pay

## 2020-08-30 DIAGNOSIS — C154 Malignant neoplasm of middle third of esophagus: Secondary | ICD-10-CM

## 2020-08-31 ENCOUNTER — Encounter: Payer: Self-pay | Admitting: Physician Assistant

## 2020-08-31 ENCOUNTER — Inpatient Hospital Stay: Payer: Self-pay | Admitting: Dietician

## 2020-08-31 ENCOUNTER — Inpatient Hospital Stay: Payer: Self-pay | Attending: Hematology | Admitting: Hematology

## 2020-08-31 ENCOUNTER — Inpatient Hospital Stay: Payer: Self-pay

## 2020-08-31 ENCOUNTER — Encounter: Payer: Self-pay | Admitting: Hematology

## 2020-08-31 ENCOUNTER — Other Ambulatory Visit: Payer: Self-pay

## 2020-08-31 ENCOUNTER — Other Ambulatory Visit (HOSPITAL_COMMUNITY): Payer: Self-pay

## 2020-08-31 VITALS — BP 104/76 | HR 99 | Temp 98.7°F | Resp 19 | Ht 66.0 in | Wt 156.5 lb

## 2020-08-31 DIAGNOSIS — C154 Malignant neoplasm of middle third of esophagus: Secondary | ICD-10-CM

## 2020-08-31 DIAGNOSIS — Z923 Personal history of irradiation: Secondary | ICD-10-CM | POA: Insufficient documentation

## 2020-08-31 DIAGNOSIS — R1310 Dysphagia, unspecified: Secondary | ICD-10-CM | POA: Insufficient documentation

## 2020-08-31 DIAGNOSIS — R634 Abnormal weight loss: Secondary | ICD-10-CM | POA: Insufficient documentation

## 2020-08-31 DIAGNOSIS — Z5189 Encounter for other specified aftercare: Secondary | ICD-10-CM | POA: Insufficient documentation

## 2020-08-31 DIAGNOSIS — Z5112 Encounter for antineoplastic immunotherapy: Secondary | ICD-10-CM | POA: Insufficient documentation

## 2020-08-31 DIAGNOSIS — Z79899 Other long term (current) drug therapy: Secondary | ICD-10-CM | POA: Insufficient documentation

## 2020-08-31 DIAGNOSIS — R531 Weakness: Secondary | ICD-10-CM | POA: Insufficient documentation

## 2020-08-31 DIAGNOSIS — C7951 Secondary malignant neoplasm of bone: Secondary | ICD-10-CM | POA: Insufficient documentation

## 2020-08-31 DIAGNOSIS — G893 Neoplasm related pain (acute) (chronic): Secondary | ICD-10-CM | POA: Insufficient documentation

## 2020-08-31 DIAGNOSIS — Z5111 Encounter for antineoplastic chemotherapy: Secondary | ICD-10-CM | POA: Insufficient documentation

## 2020-08-31 DIAGNOSIS — F1021 Alcohol dependence, in remission: Secondary | ICD-10-CM | POA: Insufficient documentation

## 2020-08-31 LAB — CBC WITH DIFFERENTIAL/PLATELET
Abs Immature Granulocytes: 0.06 10*3/uL (ref 0.00–0.07)
Basophils Absolute: 0 10*3/uL (ref 0.0–0.1)
Basophils Relative: 0 %
Eosinophils Absolute: 0 10*3/uL (ref 0.0–0.5)
Eosinophils Relative: 0 %
HCT: 36.3 % — ABNORMAL LOW (ref 39.0–52.0)
Hemoglobin: 12.5 g/dL — ABNORMAL LOW (ref 13.0–17.0)
Immature Granulocytes: 2 %
Lymphocytes Relative: 18 %
Lymphs Abs: 0.5 10*3/uL — ABNORMAL LOW (ref 0.7–4.0)
MCH: 32.5 pg (ref 26.0–34.0)
MCHC: 34.4 g/dL (ref 30.0–36.0)
MCV: 94.3 fL (ref 80.0–100.0)
Monocytes Absolute: 0.7 10*3/uL (ref 0.1–1.0)
Monocytes Relative: 24 %
Neutro Abs: 1.5 10*3/uL — ABNORMAL LOW (ref 1.7–7.7)
Neutrophils Relative %: 56 %
Platelets: 215 10*3/uL (ref 150–400)
RBC: 3.85 MIL/uL — ABNORMAL LOW (ref 4.22–5.81)
RDW: 15.5 % (ref 11.5–15.5)
WBC: 2.8 10*3/uL — ABNORMAL LOW (ref 4.0–10.5)
nRBC: 0 % (ref 0.0–0.2)

## 2020-08-31 LAB — COMPREHENSIVE METABOLIC PANEL
ALT: 11 U/L (ref 0–44)
AST: 21 U/L (ref 15–41)
Albumin: 3.8 g/dL (ref 3.5–5.0)
Alkaline Phosphatase: 79 U/L (ref 38–126)
Anion gap: 10 (ref 5–15)
BUN: 7 mg/dL (ref 6–20)
CO2: 25 mmol/L (ref 22–32)
Calcium: 9.7 mg/dL (ref 8.9–10.3)
Chloride: 103 mmol/L (ref 98–111)
Creatinine, Ser: 0.64 mg/dL (ref 0.61–1.24)
GFR, Estimated: >60 mL/min (ref >60)
Glucose, Bld: 107 mg/dL — ABNORMAL HIGH (ref 70–99)
Potassium: 3.6 mmol/L (ref 3.5–5.1)
Sodium: 138 mmol/L (ref 135–145)
Total Bilirubin: 0.6 mg/dL (ref 0.3–1.2)
Total Protein: 7.5 g/dL (ref 6.5–8.1)

## 2020-08-31 MED ORDER — DIPHENHYDRAMINE HCL 50 MG/ML IJ SOLN
50.0000 mg | Freq: Once | INTRAMUSCULAR | Status: AC
Start: 2020-08-31 — End: 2020-08-31
  Administered 2020-08-31: 50 mg via INTRAVENOUS

## 2020-08-31 MED ORDER — DIPHENHYDRAMINE HCL 50 MG/ML IJ SOLN
INTRAMUSCULAR | Status: AC
  Filled 2020-08-31: qty 1

## 2020-08-31 MED ORDER — FAMOTIDINE 20 MG IN NS 100 ML IVPB
20.0000 mg | Freq: Once | INTRAVENOUS | Status: AC
  Administered 2020-08-31: 20 mg via INTRAVENOUS

## 2020-08-31 MED ORDER — SODIUM CHLORIDE 0.9 % IV SOLN
222.9000 mg | Freq: Once | INTRAVENOUS | Status: AC
  Administered 2020-08-31: 220 mg via INTRAVENOUS
  Filled 2020-08-31: qty 22

## 2020-08-31 MED ORDER — SODIUM CHLORIDE 0.9 % IV SOLN
Freq: Once | INTRAVENOUS | Status: AC
  Filled 2020-08-31: qty 250

## 2020-08-31 MED ORDER — PALONOSETRON HCL INJECTION 0.25 MG/5ML
INTRAVENOUS | Status: AC
  Filled 2020-08-31: qty 5

## 2020-08-31 MED ORDER — HYDROCODONE-ACETAMINOPHEN 7.5-325 MG/15ML PO SOLN
10.0000 mL | Freq: Three times a day (TID) | ORAL | 0 refills | Status: DC | PRN
  Filled 2020-08-31: qty 473, 16d supply, fill #0

## 2020-08-31 MED ORDER — PACLITAXEL CHEMO INJECTION 300 MG/50ML
60.0000 mg/m2 | Freq: Once | INTRAVENOUS | Status: AC
  Administered 2020-08-31: 114 mg via INTRAVENOUS
  Filled 2020-08-31: qty 19

## 2020-08-31 MED ORDER — SODIUM CHLORIDE 0.9 % IV SOLN
200.0000 mg | Freq: Once | INTRAVENOUS | Status: AC
  Administered 2020-08-31: 200 mg via INTRAVENOUS
  Filled 2020-08-31: qty 8

## 2020-08-31 MED ORDER — FAMOTIDINE 20 MG IN NS 100 ML IVPB
INTRAVENOUS | Status: AC
  Filled 2020-08-31: qty 100

## 2020-08-31 MED ORDER — PALONOSETRON HCL INJECTION 0.25 MG/5ML
0.2500 mg | Freq: Once | INTRAVENOUS | Status: AC
  Administered 2020-08-31: 0.25 mg via INTRAVENOUS

## 2020-08-31 NOTE — Patient Instructions (Addendum)
[facility]  Discharge Instructions: Thank you for choosing [facility] to provide your oncology and hematology care.   If you have a lab appointment with the Cancer Center, please go directly to the Cancer Center and check in at the registration area.   Wear comfortable clothing and clothing appropriate for easy access to any Portacath or PICC line.   We strive to give you quality time with your provider. You may need to reschedule your appointment if you arrive late (15 or more minutes).  Arriving late affects you and other patients whose appointments are after yours.  Also, if you miss three or more appointments without notifying the office, you may be dismissed from the clinic at the provider's discretion.      For prescription refill requests, have your pharmacy contact our office and allow 72 hours for refills to be completed.    Today you received the following chemotherapy and/or immunotherapy agents: Keytruda, Taxol, Carboplatin     To help prevent nausea and vomiting after your treatment, we encourage you to take your nausea medication as directed.  BELOW ARE SYMPTOMS THAT SHOULD BE REPORTED IMMEDIATELY: . *FEVER GREATER THAN 100.4 F (38 C) OR HIGHER . *CHILLS OR SWEATING . *NAUSEA AND VOMITING THAT IS NOT CONTROLLED WITH YOUR NAUSEA MEDICATION . *UNUSUAL SHORTNESS OF BREATH . *UNUSUAL BRUISING OR BLEEDING . *URINARY PROBLEMS (pain or burning when urinating, or frequent urination) . *BOWEL PROBLEMS (unusual diarrhea, constipation, pain near the anus) . TENDERNESS IN MOUTH AND THROAT WITH OR WITHOUT PRESENCE OF ULCERS (sore throat, sores in mouth, or a toothache) . UNUSUAL RASH, SWELLING OR PAIN  . UNUSUAL VAGINAL DISCHARGE OR ITCHING   Items with * indicate a potential emergency and should be followed up as soon as possible or go to the Emergency Department if any problems should occur.  Please show the CHEMOTHERAPY ALERT CARD or  IMMUNOTHERAPY ALERT CARD at check-in to the Emergency Department and triage nurse.  Should you have questions after your visit or need to cancel or reschedule your appointment, please contact [facility]  Dept: [phone]  and follow the prompts.  Office hours are 8:00 a.m. to 4:30 p.m. Monday - Friday. Please note that voicemails left after 4:00 p.m. may not be returned until the following business day.  We are closed weekends and major holidays. You have access to a nurse at all times for urgent questions. Please call the main number to the clinic Dept: [phone] and follow the prompts.   For any non-urgent questions, you may also contact your provider using MyChart. We now offer e-Visits for anyone 18 and older to request care online for non-urgent symptoms. For details visit [email or web address].   Also download the MyChart app! Go to the app store, search "MyChart", open the app, select , and log in with your MyChart username and password.  Due to Covid, a mask is required upon entering the hospital/clinic. If you do not have a mask, one will be given to you upon arrival. For doctor visits, patients may have 1 support person aged 18 or older with them. For treatment visits, patients cannot have anyone with them due to current Covid guidelines and our immunocompromised population.   

## 2020-08-31 NOTE — Progress Notes (Signed)
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 He completed palliative radiation to sacrum on 04/10/20. Patient is receiving keytruda/carbo/taxol  Met with patient and interpretor in infusion. Patient reports he is having difficulty swallowing and not eating much. Patient is scheduled to see GI on 8/11. He mostly drinking liquids, recalls sodas and juice. He reports drinking 2 Ensure Plus and eating some soft foods like mashed potatoes. Patient denies nausea, vomiting diarrhea, says he is tired today.   Medications: reviewed  Labs: reviewed  Anthropometrics: Weight 156 lb 8 oz today decreased from 158 lb 12 oz on 7/22 and 164 lb 14.4 oz on 6/24  6/10- 159 lb 6.4 oz 5/13 - 161 lb 12.8 oz 4/01 - 160 lb 3.2 oz   NUTRITION DIAGNOSIS: Inadequate oral intake ongoing  INTERVENTION:  Educated on soft, moist high protein foods, handout with recipes provided Discussed foods easy to chew and swallow and ways to alter texture of foods (putting in blender, adding broth, creams, gravy) Discussed ways to add calories and protein to foods Recommended drinking 4-5 Ensure Plus/equivalent daily (350 kcal, 16 grams protein) Complimentary case of Ensure Plus provided today Patient has appointment with [facility] 8/11, will follow for GI recommendations  Patient has contact information    MONITORING, EVALUATION, GOAL: weight trends, intake   NEXT VISIT: To be scheduled

## 2020-08-31 NOTE — Progress Notes (Signed)
[name]   [phone] [phone]   Clinic Follow up Note   Patient Care Team: [name], MD as PCP - General (Hematology) [name], MD as Consulting Physician (Oncology)  Date of Service:  08/31/2020  CHIEF COMPLAINT: f/u of esophageal cancer  SUMMARY OF ONCOLOGIC HISTORY: Oncology History Overview Note  Cancer Staging Malignant neoplasm of middle third of esophagus ([place]) Staging form: Esophagus - Squamous Cell Carcinoma, AJCC 8th Edition - Clinical: Stage Unknown (cTX, cN1, cM0) - Signed by [name], MD on 08/18/2019    Malignant neoplasm of middle third of esophagus [facility])   Initial Diagnosis   Malignant neoplasm of middle third of esophagus ([place])   08/11/2019 Imaging   CT CAP W contrast 08/11/19 IMPRESSION: 1. Mid to lower esophageal mass along an 8 cm segment, large endoluminal component, strongly favoring esophageal malignancy. Borderline enlarged AP window lymph node. No findings of distant metastatic disease. 2. Trace bilateral pleural effusions. 3. Diffuse hepatic steatosis. 4. Cholelithiasis. 5. Fatty spermatic cords likely due to small bilateral indirect inguinal hernias.   08/11/2019 Procedure   EGD by Dr Carlean Purl  IMPRESSION - Partially obstructing, likely malignant esophageal tumor was found in the upper third of the esophagus and in the middle third of the esophagus. Biopsied. Very large and long - difficult but able to advance scope by this lesion - await CT for better length estimate - Normal stomach. - Normal examined duodenum.   08/11/2019 Initial Biopsy   FINAL MICROSCOPIC DIAGNOSIS:   A. ESOPHAGUS, UPPER, BIOPSY:  - Squamous cell carcinoma.    08/11/2019 Genetic Testing   PD-L1 Expression 90%   08/18/2019 Cancer Staging   Staging form: Esophagus - Squamous Cell Carcinoma, AJCC 8th Edition - Clinical: Stage Unknown (cTX, cN1, cM0) - Signed by [name], MD on 08/18/2019    08/29/2019 - 10/04/2019 Chemotherapy    Concurrent chemoradiation with weekly CT for 6 weeks starting 08/29/19-10/04/19    08/29/2019 - 10/06/2019 Radiation Therapy   Concurrent chemoradiation with Dr Lisbeth Renshaw starting 08/29/19-10/06/19   11/11/2019 PET scan   IMPRESSION: 1. Again seen is a long segment of FDG avid wall thickening of the distal half of the thoracic esophagus compatible with known esophageal neoplasm. 2. No signs of FDG avid nodal or distant metastatic disease.     11/28/2019 Procedure   EUS by Dr Rush Landmark IMPRESSION EGD Impression: - No gross lesions in esophagus proximally. - Previously noted esophageal cancer is not present. However, in its place and extending distal to this is likely LA Grade D chemotherapy/radiation esophagitis with bleeding. Biopsied after the EUS was complete to evaluate for persistent disease or not. - Z-line regular, 37 cm from the incisors. - 3 cm hiatal hernia. - Erythematous mucosa in the stomach. No other gross lesions in the stomach. Biopsied. - No gross lesions in the duodenal bulb, in the first portion of the duodenum and in the second portion of the duodenum. EUS Impression: - Wall thickening was seen in the thoracic esophagus into the gastroesophageal junction. The thickening appeared to primarily be within the luminal interface/superficial mucosa (Layer 1) and deep mucosa (Layer 2). The previously noted esophageal cancer has been replaced by what appears to be radiation/chemotherapy induced changes. With the limitations of having to use the mini-probe EUS, I could not visualize persistent significant disease other than the entire distal area of the esophagus having likely changes noted as above. - No malignant-appearing lymph nodes were visualized in the middle paraesophageal mediastinum (level 27M),  lower paraesophageal mediastinum (level 8L), diaphragmatic region (level 15) and paracardial region (level 16).      FINAL MICROSCOPIC DIAGNOSIS:   A. STOMACH, BIOPSY:  -  Mildly active chronic gastritis.  - Warthin-Starry negative for Helicobacter pylori.  - No intestinal metaplasia, dysplasia or carcinoma.   B. ESOPHAGUS, BIOPSY:  - Inflamed granulation tissue and exudate consistent with ulcer.  - No malignancy identified.    12/20/2019 - 02/2020 Chemotherapy   CAPOX q3weeks with Xeloda 1576m BID 2 weeks on/1 week off starting 12/20/19-02/2020   03/21/2020 Imaging   [facility]Circumferential thickening of the lower third of the esophagus which corresponds with known esophageal malignancy. No evidence of metastatic disease within the chest.  --For findings below the diaphragm, please see concurrent separately reported CT of the abdomen and pelvis.  --Expansile lytic mass with associated soft tissue component centered in the left sacral ala and abutting the left S1 and S2 nerve roots, left internal iliac vasculature, and left pyriformis muscle with no other metastatic disease within the abdomen or pelvis.   --Diffuse thickening of the distal esophagus compatible with known esophageal malignancy.   --Cholelithiasis with nonspecific gallbladder distention. Clinical correlation is recommended. If there is clinical concern for cholecystitis, further evaluation with ultrasound can be obtained.   03/21/2020 Imaging   CT Lumbar Spine  Expansile lytic lesion centered in the left sacral ala with no other evidence of metastatic disease in the lumbar spine.   03/26/2020 - 04/10/2020 Radiation Therapy   Palliative Radiation to Sacrum with Dr MLisbeth Renshaw2/28/22-3/15/22.    04/11/2020 Progression   PET  IMPRESSION: 1. New hypermetabolic expansile 6.1 cm upper left sacral lytic bone metastasis, centrally necrotic. 2. New hypermetabolic left level 1b and left level 2 neck lymph nodes, indeterminate for reactive versus metastatic nodes. 3. Persistent hypermetabolism associated with a short segment of mid to lower thoracic esophagus with associated  circumferential esophageal wall thickening, similar in metabolism and decreased in wall thickness since 11/11/2019 PET-CT, nonspecific, favor post treatment change, with residual neoplasm not excluded. 4. No additional sites of hypermetabolic metastatic disease. 5.  Chronic findings include: Cholelithiasis.   04/27/2020 -  Chemotherapy   First-line with immunotherapy Keytruda q3weeks starting 04/27/20       06/29/2020 -  Chemotherapy   Weekly carbo AUC 1.5 and Taxol 63mm2 on day 1, 8 every 21 days added to first-line Keytruda from C4 on 06/29/20.     07/17/2020 PET scan   IMPRESSION: 1. Left sacral metastasis is slightly enlarged and slightly increased in peripheral hypermetabolism. No additional evidence of metastatic disease. 2. Similar mild hypermetabolism associated with the mid esophagus. No residual hypermetabolism within left neck lymph nodes. 3. Small pericardial effusion. 4. Trace bilateral pleural effusions. 5. Cholelithiasis.        CURRENT THERAPY:  First-line Keytruda q3weeks starting 04/27/20, added weekly carbo AUC 1.5 and Taxol 6080m2 from cycle 4 (06/29/20) on day 1, 8 every 21 days   INTERVAL HISTORY:  VicDamone Fancher here for a follow up of esophageal cancer. He was last seen by me on 07/20/20 and by PA Cassie in the interim. He presents to the clinic accompanied by an interpreter. He reports he is not eating much because of difficulty swallowing. He notes he is able to swallow liquids. He is scheduled to see GI on 09/06/20. He reports his bowel movements are normal, and he denies nausea or vomiting. He reports the pain in his left hip and tailbone are still  present and about the same. He rates it 5/10 today. He is taking the liquid pain medicine twice a day. He is unsure of the amount, just that he fills to the first line on the cup. He also takes the morphine twice a day.   All other systems were reviewed with the patient and are negative.  MEDICAL  HISTORY:  Past Medical History:  Diagnosis Date   GERD (gastroesophageal reflux disease)    Hematemesis with nausea 08/10/2019    SURGICAL HISTORY: Past Surgical History:  Procedure Laterality Date   BIOPSY  08/11/2019   Procedure: BIOPSY;  Surgeon: [name], MD;  [facility] END[facility]COPY;  Service: Endoscopy;;   BIOPSY  11/28/2019   Procedure: BIOPSY;  Surgeon: [name], MD;  Location: [facility];  Service: Gastroenterology;;   ESOPHAGOGASTRODUODENOSCOPY  08/11/2019   ESOPHAGOGASTRODUODENOSCOPY (EGD) WITH PROPOFOL N/A 08/11/2019   Procedure: ESOPHAGOGASTRODUODENOSCOPY (EGD) WITH PROPOFOL;  Surgeon: [name], MD;  Location: [facility];  Service: Endoscopy;  Laterality: N/A;   ESOPHAGOGASTRODUODENOSCOPY (EGD) WITH PROPOFOL N/A 11/28/2019   Procedure: ESOPHAGOGASTRODUODENOSCOPY (EGD) WITH PROPOFOL;  Surgeon: Rush Landmark Telford Nab., MD;  Location: [facility];  Service: Gastroenterology;  Laterality: N/A;   EUS N/A 11/28/2019   Procedure: UPPER ENDOSCOPIC ULTRASOUND (EUS) RADIAL;  Surgeon: [name], MD;  Location: [facility];  Service: Gastroenterology;  Laterality: N/A;    I have reviewed the social history and family history with the patient and they are unchanged from previous note.  ALLERGIES:  has No Known Allergies.  MEDICATIONS:  Current Outpatient Medications  Medication Sig Dispense Refill   diphenhydrAMINE-APAP, sleep, (TYLENOL PM EXTRA STRENGTH) 50-1000 MG/30ML LIQD Take 15 mLs by mouth at bedtime as needed (sleep).     famotidine (PEPCID) 20 MG tablet TOME 1 PASTILLA POR VIA ORAL DOS VECES AL DIA 60 tablet 3   Ferrous Sulfate (IRON) 325 (65 Fe) MG TABS Take 1 tablet (325 mg total) by mouth 2 (two) times daily. 60 tablet 3   ferrous sulfate 325 (65 FE) MG tablet TAKE 1 TABLET BY MOUTH 2 TIMES DAILY. 60 tablet 3   gabapentin (NEURONTIN) 300 MG capsule Take 1 capsule (300 mg total) by mouth 3 (three) times daily. 90 capsule 0    HYDROcodone-acetaminophen (HYCET) 7.5-325 mg/15 ml solution Take 10 mLs by mouth every 8 (eight) hours as needed for moderate pain. 473 mL 0   mirtazapine (REMERON) 7.5 MG tablet TOME 1 PASTILLA POR VIA ORAL A LA HORA DE ACOSTARSE 30 tablet 0   morphine (MS CONTIN) 30 MG 12 hr tablet Take 1 tablet (30 mg total) by mouth every 8 (eight) hours. 90 tablet 0   prochlorperazine (COMPAZINE) 10 MG tablet Take 1 tablet (10 mg total) by mouth every 6 (six) hours as needed for nausea or vomiting. 60 tablet 2   sucralfate (CARAFATE) 1 g tablet Take 1 tablet (1 g total) by mouth 4 (four) times daily. Dissolve each tablet in 15 cc water before use. 120 tablet 2   No current facility-administered medications for this visit.    PHYSICAL EXAMINATION: ECOG PERFORMANCE STATUS: 2 - Symptomatic, <50% confined to bed  Vitals:   08/31/20 1124  BP: 104/76  Pulse: 99  Resp: 19  Temp: 98.7 F (37.1 C)  SpO2: 98%   Wt Readings from Last 3 Encounters:  08/31/20 156 lb 8 oz (71 kg)  08/17/20 158 lb 12 oz (72 kg)  08/10/20 160 lb 14.4 oz (73 kg)     GENERAL:alert,  no distress and comfortable SKIN: skin color normal, no rashes or significant lesions EYES: normal, Conjunctiva are pink and non-injected, sclera clear  NEURO: alert & oriented x 3 with fluent speech  LABORATORY DATA:  I have reviewed the data as listed CBC Latest Ref Rng & Units 08/31/2020 08/17/2020 08/10/2020  WBC 4.0 - 10.5 K/uL 2.8(L) 5.2 2.4(L)  Hemoglobin 13.0 - 17.0 g/dL 12.5(L) 12.7(L) 13.9  Hematocrit 39.0 - 52.0 % 36.3(L) 35.7(L) 39.4  Platelets 150 - 400 K/uL 215 144(L) 163     CMP Latest Ref Rng & Units 08/17/2020 08/10/2020 07/27/2020  Glucose 70 - 99 mg/dL 116(H) 110(H) 112(H)  BUN 6 - 20 mg/dL 6 <4(L) <4(L)  Creatinine 0.61 - 1.24 mg/dL 0.60(L) 0.63 0.64  Sodium 135 - 145 mmol/L 137 138 139  Potassium 3.5 - 5.1 mmol/L 3.7 3.6 3.6  Chloride 98 - 111 mmol/L 101 105 103  CO2 22 - 32 mmol/L _0 Calcium 8.9 - 10.3 mg/dL 9.3  9.1 9.5  Total Protein 6.5 - 8.1 g/dL 7.6 7.4 7.5  Total Bilirubin 0.3 - 1.2 mg/dL 0.7 0.7 0.5  Alkaline Phos 38 - 126 U/L 78 82 81  AST 15 - 41 U/L _1 ALT 0 - 44 U/L _2 RADIOGRAPHIC STUDIES: I have personally reviewed the radiological images as listed and agreed with the findings in the report. No results found.   ASSESSMENT & PLAN:  Jimmy Hawkins is a 45 y.o. male with   1. 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 08/11/19 CT CAP found him to have mid esophageal mass and enlarged AP window node, no distant metastasis. -He completed 6 weeks of concurrent chemoRT with weekly Carboplatin and Taxol 08/29/19-10/06/19 -Repeat PET scan October 2021 showed hypermetabolic wall thickening of the distal half of the thoracic esophageus, no notable distant metastasis. Dr. Kipp Brood has reviewed the PET scan, and feels that his tumor invades trachea is it's not resectable. He will f/u with Dr Kipp Brood on 01/06/20. -He completed consolidation chemotherapy with CAPOX q3weeks with Xeloda 151m BID 2 weeks on/1 week off starting 12/20/19-02/2020. -He completed palliative Radiation to sacrum with Dr MLisbeth Renshaw2/28/22-3/15/22.  -He has started First-line single agent Keytruda 04/27/20. Due to his slow improvement and squamous cell histology, Dr. FBurr Medicoadded chemo with carboplatin and Taxol 2 weeks on/1 week off from C4 on 06/29/20. He receives zarxio as needed for neutropenia. -His PET from 07/17/20 showed Left sacral metastasis is slightly enlarged and slightly increased in peripheral hypermetabolism. No additional evidence of metastatic disease. His cancer is controlled but did not have significant response from KO[place]oodalone. Will continue chemotherapy as well. -Labs reviewed and adequate to proceed with Keytruda and chemo today.  -f/u 3  weeks. He can contact [facility] if he has any concerns.    2. Sacral/back pain, LE weakness, secondary to bone metastasis -He was seen to have bone mets on 04/11/20 PET scan. -He completed palliative Radiation to sacrum with Dr MLisbeth Renshaw2/28/22-3/15/22. He was given short course dexa 474m  -He is currently taking morphine BID and hydrocodone 10cc BID. -He rates the pain at 5/10 today.   3. H/o Heavy alcohol user -He quit drinking since cancer diagnosis, but started again. Due to weakness, he is not able to drink as much since 04/13/20 and has stopped overall (  06/29/20)   4. Social and Acupuncturist  -Patient does not have insurance and is not currently working. He has already applied for Medicaid. -He has met with financial advocate Shauna and SW and he has grant assistance with medications. -Will see if he can get drug replacement for Keytruda. -Patient, family and Interpretor notes difficulty with communication. They will set up Mychart for more direct line.  -I will send a message to SW and financial advocate regarding his financial concerns    5. Dysphagia and weight loss -He continues to have difficulty swallowing, and as a result has lost weight. -He is scheduled for follow up with GI on 09/06/20 -f/u with dietician   PLAN: -proceed with [name] and taxol today, chemo day 8 next week  -labs and f/u in 3 weeks before next cycle chemo    No problem-specific Assessment & Plan notes found for this encounter.   No orders of the defined types were placed in this encounter.  All questions were answered. The patient knows to call the clinic with any problems, questions or concerns. No barriers to learning was detected. The total time spent in the appointment was 30 minutes.     [name], MD 08/31/2020   I, Wilburn Mylar, am acting as scribe for [name], MD.   I have reviewed the above documentation for accuracy and completeness, and I agree with the above.

## 2020-09-01 ENCOUNTER — Encounter: Payer: Self-pay | Admitting: Hematology

## 2020-09-01 ENCOUNTER — Other Ambulatory Visit (HOSPITAL_COMMUNITY): Payer: Self-pay

## 2020-09-01 ENCOUNTER — Encounter: Payer: Self-pay | Admitting: Physician Assistant

## 2020-09-03 ENCOUNTER — Inpatient Hospital Stay: Payer: Self-pay

## 2020-09-03 ENCOUNTER — Other Ambulatory Visit: Payer: Self-pay

## 2020-09-03 VITALS — BP 113/78 | HR 108 | Temp 100.1°F | Resp 19

## 2020-09-03 DIAGNOSIS — D500 Iron deficiency anemia secondary to blood loss (chronic): Secondary | ICD-10-CM

## 2020-09-03 DIAGNOSIS — D702 Other drug-induced agranulocytosis: Secondary | ICD-10-CM

## 2020-09-03 MED ORDER — FILGRASTIM-SNDZ 300 MCG/0.5ML IJ SOSY
300.0000 ug | PREFILLED_SYRINGE | Freq: Once | INTRAMUSCULAR | Status: AC
  Administered 2020-09-03: 300 ug via SUBCUTANEOUS

## 2020-09-03 MED ORDER — FILGRASTIM-SNDZ 300 MCG/0.5ML IJ SOSY
PREFILLED_SYRINGE | INTRAMUSCULAR | Status: AC
  Filled 2020-09-03: qty 0.5

## 2020-09-03 NOTE — Patient Instructions (Addendum)
Filgrastim, G-CSF injection Qu es este medicamento? El FILGRASTIM, G-CSF es un factor estimulante de colonias de granulocitos que estimula el crecimiento de los neutrfilos, un tipo de glbulo blanco importante en la lucha del cuerpo contra las infecciones. [place] para reducir la incidencia de fiebre e infeccin en pacientes con ciertos tipos de cncer que estn recibiendo quimioterapia que afecta la mdula sea, para estimular la produccin de clulas sanguneas a fin de eliminar los glbulos blancos del cuerpo antes de un trasplante de mdula sea, para reducir la incidencia de fiebre e infeccin en pacientes que tienen neutropenia crnica grave, y para [employer] de supervivencia despus de exposicin a radiacin dedosis alta que es txica para la mdula sea. Este medicamento puede ser utilizado para otros usos; si tiene [place] preguntaconsulte con su proveedor de atencin mdica o con su farmacutico. MARCAS COMUNES: Neupogen, Nivestym, Releuko, Zarxio [employer] debo informar a mi profesional de la salud antes de tomar estemedicamento? Necesitan saber si usted presenta alguno de los siguientes problemas osituaciones: enfermedad renal alergia al ltex terapia de radiacin en curso enfermedad de clulas falciformes una reaccin alrgica o inusual al filgrastim, pegfilgrastim, a otros medicamentos, alimentos, colorantes o conservantes siest embarazada o buscando quedar embarazada si est amamantando a un beb Cmo debo utilizar este medicamento? Este medicamento se administra mediante una inyeccin por va subcutnea o mediante infusin por va intravenosa. Cuando es mediante infusin por va intravenosa, generalmente lo administra un profesional de la salud en un hospital o en un entorno clnico. Si recibe [employer] en su casa, le ensearn cmo prepararlo y administrarlo. Consulte las Instrucciones de uso que vienen con el envase de su medicamento. Use el medicamento exactamente  como se le indique. Use su medicamento a intervalos regulares. No use su medicamentocon una frecuencia mayor a la indicada. Es importante que deseche las agujas y las jeringas usadas en un recipiente resistente a los pinchazos. No las deseche en la basura. Si no tiene un recipiente resistente a los pinchazos, llame a su farmacutico o proveedor deatencin de la salud para obtenerlo. Hable con su pediatra para informarse acerca del uso de este medicamento en nios. Aunque este medicamento se puede recetar a nios tan pequeos como de 64mses de edad con ciertas afecciones, existen precauciones que deben tomarse. Sobredosis: Pngase en contacto inmediatamente con un centro toxicolgico o unasala de urgencia si usted cree que haya tomado demasiado medicamento. ATENCIN: EConAgra Foodses solo para usted. No comparta este medicamento connadie. Qu sucede si me olvido de una dosis? Es importante no olvidar ninguna dosis. Hable con su mdico o profesional de lasalud si olvida una dosis. Qu puede interactuar con este medicamento? Este medicamento podra interactuar con los siguientes frmacos: medicamentos que pueden causar una liberacin de neutrfilos, tales como litio Puede ser que esta lista no menciona todas las posibles interacciones. Informe a su profesional de lKB Home	[place] tAES Corporationproductos a base de hierbas, medicamentos de vDesereto suplementos nutritivos que est tomando. Si usted fuma, consume bebidas alcohlicas o si utiliza drogas ilegales, indqueselo tambin a su profesional de lKB Home	[place] Algunas sustancias pueden interactuar consu medicamento. A qu debo estar atento al usar eCoca-Cola Se supervisar su estado de salud atentamente mientras reciba este medicamento. Usted podra necesitar realizarse anlisis de sangre mientras est usando estemedicamento. Hable con su proveedor de atencin mdica sobre su riesgo de cncer. Ustedpuede tener mayor riesgo para ciertos tipos de  cncer si uCanadaeste medicamento. Qu efectos secundarios puedo  tener al [employer] este medicamento? Efectos secundarios que debe informar a su mdico o a su profesional de lasalud tan pronto como sea posible: Chief of Staff, tales como erupcin cutnea, comezn/picazn o urticaria, e hinchazn de la cara, los labios o la Holiday representative de espalda mareos o sensacin de Marine scientist, enrojecimiento o Actor de la inyeccin puntos rojos en la piel falta de aire o problemas respiratorios signos y sntomas de lesin al rin, tales como dificultad para Garment/textile technologist, cambios en la cantidad de [place], u orina color rojo o Forensic psychologist oscuro dolor de Paramedic o del costado, o Social research officer, government en el hombro hinchazn cansancio sangrado omoretones inusuales Efectos secundarios que generalmente no requieren atencin mdica (infrmelos asu mdico o a Barrister's clerk de la salud si persisten o si son molestos): dolor de huesos tos diarrea cada del cabello dolor de Research officer, trade union Puede ser que esta lista no menciona todos los posibles efectos secundarios. Comunquese a su mdico por asesoramiento mdico [employer]. Usted puede informar los efectos secundarios a la FDA por telfono al1-800-FDA-1088. Dnde debo guardar mi medicina? Mantenga fuera del alcance de los nios. Guarde en el refrigerador a una temperatura de [place] 2 y 87 grados Celsius (entre 60 y 24 grados Fahrenheit). No congele. Mantenga en el envase para proteger de Naval architect. Deseche este medicamento si los frascos o jeringas quedan fuera del refrigerador durante ms de 24 horas. Deseche todo el medicamento queno haya utilizado despus de la fecha de vencimiento. ATENCIN: Este folleto es un resumen. Puede ser que no cubra toda la posible informacin. Si usted tiene preguntas acerca de esta medicina, consulte con sumdico, su farmacutico o su profesional de Technical sales engineer.  2022 Elsevier/Gold Standard (2019-07-19 00:00:00)

## 2020-09-04 NOTE — Progress Notes (Signed)
..  The following Assist/Replace Program for Feraheme from [facility] has been terminated due to program expired on 08/20/2020.  Last DOS: 08/22/2019.

## 2020-09-05 ENCOUNTER — Encounter: Payer: Self-pay | Admitting: Physician Assistant

## 2020-09-05 ENCOUNTER — Encounter: Payer: Self-pay | Admitting: Hematology

## 2020-09-05 ENCOUNTER — Other Ambulatory Visit: Payer: Self-pay

## 2020-09-05 ENCOUNTER — Other Ambulatory Visit (HOSPITAL_COMMUNITY): Payer: Self-pay

## 2020-09-05 DIAGNOSIS — K920 Hematemesis: Secondary | ICD-10-CM

## 2020-09-05 MED ORDER — PROCHLORPERAZINE MALEATE 10 MG PO TABS
10.0000 mg | ORAL_TABLET | Freq: Four times a day (QID) | ORAL | 2 refills | Status: DC | PRN
  Filled 2020-09-05: qty 60, 15d supply, fill #0

## 2020-09-06 ENCOUNTER — Encounter: Payer: Self-pay | Admitting: Physician Assistant

## 2020-09-06 ENCOUNTER — Ambulatory Visit: Payer: Self-pay | Admitting: Physician Assistant

## 2020-09-06 ENCOUNTER — Other Ambulatory Visit (HOSPITAL_COMMUNITY): Payer: Self-pay

## 2020-09-06 ENCOUNTER — Encounter: Payer: Self-pay | Admitting: Hematology

## 2020-09-06 VITALS — BP 100/78 | HR 99 | Ht 67.0 in | Wt 153.0 lb

## 2020-09-06 DIAGNOSIS — R1314 Dysphagia, pharyngoesophageal phase: Secondary | ICD-10-CM

## 2020-09-06 DIAGNOSIS — Z8501 Personal history of malignant neoplasm of esophagus: Secondary | ICD-10-CM

## 2020-09-06 MED ORDER — PANTOPRAZOLE SODIUM 40 MG PO TBEC
40.0000 mg | DELAYED_RELEASE_TABLET | Freq: Two times a day (BID) | ORAL | 3 refills | Status: DC
  Filled 2020-09-06: qty 60, 30d supply, fill #0

## 2020-09-06 MED ORDER — SUCRALFATE 1 G PO TABS
1.0000 g | ORAL_TABLET | Freq: Four times a day (QID) | ORAL | 2 refills | Status: DC
  Filled 2020-09-06: qty 120, 30d supply, fill #0

## 2020-09-06 NOTE — Patient Instructions (Addendum)
We have sent the following medications to your pharmacy for you to pick up at your convenience: Pantoprazole 40 mg twice daily 30-60 minutes before breakfast and dinner.  Refilled Carafate.   You have been scheduled for an endoscopy. Please follow written instructions given to you at your visit today. If you use inhalers (even only as needed), please bring them with you on the day of your procedure.  If you are age 43 or older, your body mass index should be between 23-30. Your Body mass index is 23.96 kg/m. If this is out of the aforementioned range listed, please consider follow up with your Primary Care Provider.  If you are age 41 or younger, your body mass index should be between 19-25. Your Body mass index is 23.96 kg/m. If this is out of the aformentioned range listed, please consider follow up with your Primary Care Provider.   __________________________________________________________  The [facility] providers would like to encourage you to use [facility] to communicate with providers for non-urgent requests or questions.  Due to long hold times on the telephone, sending your provider a message by [facility] may be a faster and more efficient way to get a response.  Please allow 48 business hours for a response.  Please remember that this is for non-urgent requests.

## 2020-09-06 NOTE — Progress Notes (Signed)
Chief Complaint: Difficulty swallowing  HPI:    [name] is a 45 year old Hispanic male, known to Dr. 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    08/11/2019 EGD with Dr. Carlean Purl with a partially obstructing likely malignant esophageal tumor in the upper third of the esophagus and in the middle third of the esophagus biopsied, very large and long and difficult but able to advance scope by this lesion.    11/28/2019 EGD/EUS with Dr. Rush Landmark with findings of previously noted esophageal cancer not present, however in its place and extending distal this was likely LA grade D chemotherapy/radiation esophagitis with bleeding, biopsied, Z-line regular, 3 cm hiatal hernia, erythematous mucosa in the stomach, normal duodenum, EUS with wall thickening seen in the thoracic esophagus into the gastroesophageal junction, previously noted esophageal cancer been replaced by what appeared to be radiation/chemotherapy induced changes with limitations of having to use the miniprobe EUS, no malignant appearing lymph nodes were visualized in the middle peristasis esophageal mediastinum, or anywhere else.  Patient continued on Pantoprazole 40 twice a day.    03/21/2020 CT circumferential thickening of the lower third of the esophagus, expansile lytic mass with associated soft tissue component centered in the left sacral alla and abutting the left S1 and S2 nerve roots.  CT lumbar spine on the same day with expansile lytic lesion centered in the left sacral alla with no other evidence of metastatic disease in the lumbar spine.    03/26/2020-04/10/2020 radiation therapy to the sacrum.    07/17/2020 PET scan with left sacral metastasis slightly enlarged and slightly increased in peripheral hypermetabolism, no additional evidence of metastatic disease, similar mild hypermetabolism associated  with the mid esophagus, small pericardial effusions.    08/31/2020 most recent visit with oncology Dr. Burr Medico and at that visit described that he was not eating much because of difficulty swallowing.    Today, patient presents to clinic accompanied by interpreter and explains that over the past 4 months he has had an increasing difficulty swallowing, this was just to solid foods but now he even has a little trouble with liquids.  He tells me they feel like they get down to a point and then just come back up.  He has been staying on mostly soups but has been losing some weight, about 8 pounds over that time.Marland Kitchen  He is currently not on Pantoprazole or Carafate.  Tells me he was not sure he was supposed to stay on these.    Denies fever, chills, abdominal pain, heartburn, reflux, nausea or vomiting.  Past Medical History:  Diagnosis Date   GERD (gastroesophageal reflux disease)    Hematemesis with nausea 08/10/2019    Past Surgical History:  Procedure Laterality Date   BIOPSY  08/11/2019   Procedure: BIOPSY;  Surgeon: [name], MD;  [facility] END[facility]COPY;  Service: Endoscopy;;   BIOPSY  11/28/2019   Procedure: BIOPSY;  Surgeon: [name], MD;  Location: [facility];  Service: Gastroenterology;;   ESOPHAGOGASTRODUODENOSCOPY  08/11/2019   ESOPHAGOGASTRODUODENOSCOPY (EGD) WITH PROPOFOL N/A 08/11/2019   Procedure: ESOPHAGOGASTRODUODENOSCOPY (EGD) WITH PROPOFOL;  Surgeon: [name], MD;  Location: [facility];  Service: Endoscopy;  Laterality: N/A;   ESOPHAGOGASTRODUODENOSCOPY (EGD) WITH PROPOFOL N/A 11/28/2019   Procedure: ESOPHAGOGASTRODUODENOSCOPY (EGD) WITH PROPOFOL;  Surgeon: Rush Landmark Telford Nab., MD;  Location[place] [facility];  Service: Gastroenterology;  Laterality: N/A;  EUS N/A 11/28/2019   Procedure: UPPER ENDOSCOPIC ULTRASOUND (EUS) RADIAL;  Surgeon: [name], MD;  Location: [facility];  Service: Gastroenterology;  Laterality: N/A;    Current  Outpatient Medications  Medication Sig Dispense Refill   diphenhydrAMINE-APAP, sleep, (TYLENOL PM EXTRA STRENGTH) 50-1000 MG/30ML LIQD Take 15 mLs by mouth at bedtime as needed (sleep).     famotidine (PEPCID) 20 MG tablet TOME 1 PASTILLA POR VIA ORAL DOS VECES AL DIA 60 tablet 3   Ferrous Sulfate (IRON) 325 (65 Fe) MG TABS Take 1 tablet (325 mg total) by mouth 2 (two) times daily. 60 tablet 3   ferrous sulfate 325 (65 FE) MG tablet TAKE 1 TABLET BY MOUTH 2 TIMES DAILY. 60 tablet 3   gabapentin (NEURONTIN) 300 MG capsule Take 1 capsule (300 mg total) by mouth 3 (three) times daily. 90 capsule 0   HYDROcodone-acetaminophen (HYCET) 7.5-325 mg/15 ml solution Take 10 mLs by mouth every 8 (eight) hours as needed for moderate pain. 473 mL 0   mirtazapine (REMERON) 7.5 MG tablet TOME 1 PASTILLA POR VIA ORAL A LA HORA DE ACOSTARSE 30 tablet 0   morphine (MS CONTIN) 30 MG 12 hr tablet Take 1 tablet (30 mg total) by mouth every 8 (eight) hours. 90 tablet 0   prochlorperazine (COMPAZINE) 10 MG tablet Take 1 tablet (10 mg total) by mouth every 6 (six) hours as needed for nausea or vomiting. 60 tablet 2   sucralfate (CARAFATE) 1 g tablet Take 1 tablet (1 g total) by mouth 4 (four) times daily. Dissolve each tablet in 15 cc water before use. 120 tablet 2   No current facility-administered medications for this visit.    Allergies as of 09/06/2020   (No Known Allergies)    Family History  Problem Relation Age of Onset   Stomach cancer Mother    Healthy Father     Social History   Socioeconomic History   Marital status: Single    Spouse name: Not on file   Number of children: Not on file   Years of education: Not on file   Highest education level: Not on file  Occupational History   Not on file  Tobacco Use   Smoking status: Never   Smokeless tobacco: Never  Vaping Use   Vaping Use: Never used  Substance and Sexual Activity   Alcohol use: Not Currently    Alcohol/week: 6.0 standard drinks     Types: 6 Cans of beer per week    Comment:  6 drinks/day   Drug use: Never   Sexual activity: Not on file  Other Topics Concern   Not on file  Social History Narrative   Not on file   Social Determinants of Health   Financial Resource Strain: Not on file  Food Insecurity: Not on file  Transportation Needs: Not on file  Physical Activity: Not on file  Stress: Not on file  Social Connections: Not on file  Intimate Partner Violence: Not on file    Review of Systems:    Constitutional: No fever or chills Cardiovascular: No chest pain Respiratory: No SOB  Gastrointestinal: See HPI and otherwise negative   Physical Exam:  Vital signs: BP 100/78   Pulse 99   Ht '5\' 7"'$  (1.702 m)   Wt 153 lb (69.4 kg)   BMI 23.96 kg/m    Constitutional:   Pleasant Hispanic male appears to be in NAD, Well developed, Well nourished, alert and cooperative Head:  Normocephalic and atraumatic.  Eyes:   PEERL, EOMI. No icterus. Conjunctiva pink. Ears:  Normal auditory acuity. Neck:  Supple Throat: Oral cavity and pharynx without inflammation, swelling or lesion.  Respiratory: Respirations even and unlabored. Lungs clear to auscultation bilaterally.   No wheezes, crackles, or rhonchi.  Cardiovascular: Normal S1, S2. No MRG. Regular rate and rhythm. No peripheral edema, cyanosis or pallor.  Gastrointestinal:  Soft, nondistended, nontender. No rebound or guarding. Normal bowel sounds. No appreciable masses or hepatomegaly. Rectal:  Not performed.  Psychiatric: Demonstrates good judgement and reason without abnormal affect or behaviors.  RELEVANT LABS AND IMAGING: CBC    Component Value Date/Time   WBC 2.8 (L) 08/31/2020 1103   RBC 3.85 (L) 08/31/2020 1103   HGB 12.5 (L) 08/31/2020 1103   HGB 12.7 (L) 08/17/2020 1339   HCT 36.3 (L) 08/31/2020 1103   PLT 215 08/31/2020 1103   PLT 144 (L) 08/17/2020 1339   MCV 94.3 08/31/2020 1103   MCH 32.5 08/31/2020 1103   MCHC 34.4 08/31/2020 1103   RDW  15.5 08/31/2020 1103   LYMPHSABS 0.5 (L) 08/31/2020 1103   MONOABS 0.7 08/31/2020 1103   EOSABS 0.0 08/31/2020 1103   BASOSABS 0.0 08/31/2020 1103    CMP     Component Value Date/Time   NA 138 08/31/2020 1103   K 3.6 08/31/2020 1103   CL 103 08/31/2020 1103   CO2 25 08/31/2020 1103   GLUCOSE 107 (H) 08/31/2020 1103   BUN 7 08/31/2020 1103   CREATININE 0.64 08/31/2020 1103   CREATININE 0.60 (L) 08/17/2020 1339   CALCIUM 9.7 08/31/2020 1103   PROT 7.5 08/31/2020 1103   ALBUMIN 3.8 08/31/2020 1103   AST 21 08/31/2020 1103   AST 22 08/17/2020 1339   ALT 11 08/31/2020 1103   ALT 18 08/17/2020 1339   ALKPHOS 79 08/31/2020 1103   BILITOT 0.6 08/31/2020 1103   BILITOT 0.7 08/17/2020 1339   GFRNONAA >60 08/31/2020 1103   GFRNONAA >60 08/17/2020 1339   GFRAA >60 10/17/2019 1441    Assessment: 1.  Dysphagia: Increasing over the past 4 months, with history of radiation and chemotherapy either Candida or stricture are most likely 2.  History of esophageal cancer: Squamous cell with some mets to the spine, status post chemo and radiation, continues to follow with oncology  Plan: 1.  Restarted Pantoprazole 40 mg twice daily, 30-60 minutes before breakfast and dinner #60 with 3 refills. 2.  Would recommend the patient restart his Carafate dissolved in 15 mL of water 4 times a day.  Prescribed #120 with 3 refills. 3.  Scheduled patient for an EGD with likely dilation in the [place] with Dr. Carlean Purl.  Did provide the patient with a detailed list of risks for the procedure and he agrees to proceed. 4.  Patient to continue his liquid diet 5.  Patient to follow in clinic per recommendations from Dr. Carlean Purl after time of procedure.  Ellouise Newer, PA-C [facility] 09/06/2020, 9:50 AM  Cc: [name], MD

## 2020-09-07 ENCOUNTER — Other Ambulatory Visit: Payer: Self-pay

## 2020-09-07 ENCOUNTER — Inpatient Hospital Stay: Payer: Self-pay

## 2020-09-07 VITALS — BP 115/77 | HR 89 | Temp 98.8°F | Resp 18 | Ht 67.0 in | Wt 156.4 lb

## 2020-09-07 DIAGNOSIS — C154 Malignant neoplasm of middle third of esophagus: Secondary | ICD-10-CM

## 2020-09-07 LAB — CBC WITH DIFFERENTIAL/PLATELET
Abs Immature Granulocytes: 0.25 10*3/uL — ABNORMAL HIGH (ref 0.00–0.07)
Basophils Absolute: 0 10*3/uL (ref 0.0–0.1)
Basophils Relative: 1 %
Eosinophils Absolute: 0 10*3/uL (ref 0.0–0.5)
Eosinophils Relative: 1 %
HCT: 33.4 % — ABNORMAL LOW (ref 39.0–52.0)
Hemoglobin: 11.9 g/dL — ABNORMAL LOW (ref 13.0–17.0)
Immature Granulocytes: 6 %
Lymphocytes Relative: 17 %
Lymphs Abs: 0.7 10*3/uL (ref 0.7–4.0)
MCH: 32.7 pg (ref 26.0–34.0)
MCHC: 35.6 g/dL (ref 30.0–36.0)
MCV: 91.8 fL (ref 80.0–100.0)
Monocytes Absolute: 0.7 10*3/uL (ref 0.1–1.0)
Monocytes Relative: 16 %
Neutro Abs: 2.5 10*3/uL (ref 1.7–7.7)
Neutrophils Relative %: 59 %
Platelets: 204 10*3/uL (ref 150–400)
RBC: 3.64 MIL/uL — ABNORMAL LOW (ref 4.22–5.81)
RDW: 14.8 % (ref 11.5–15.5)
WBC: 4.2 10*3/uL (ref 4.0–10.5)
nRBC: 0 % (ref 0.0–0.2)

## 2020-09-07 LAB — COMPREHENSIVE METABOLIC PANEL
ALT: 24 U/L (ref 0–44)
AST: 24 U/L (ref 15–41)
Albumin: 3.9 g/dL (ref 3.5–5.0)
Alkaline Phosphatase: 88 U/L (ref 38–126)
Anion gap: 9 (ref 5–15)
BUN: <4 mg/dL — ABNORMAL LOW (ref 6–20)
CO2: 24 mmol/L (ref 22–32)
Calcium: 9.1 mg/dL (ref 8.9–10.3)
Chloride: 105 mmol/L (ref 98–111)
Creatinine, Ser: 0.58 mg/dL — ABNORMAL LOW (ref 0.61–1.24)
GFR, Estimated: >60 mL/min (ref >60)
Glucose, Bld: 93 mg/dL (ref 70–99)
Potassium: 4 mmol/L (ref 3.5–5.1)
Sodium: 138 mmol/L (ref 135–145)
Total Bilirubin: 0.5 mg/dL (ref 0.3–1.2)
Total Protein: 7.7 g/dL (ref 6.5–8.1)

## 2020-09-07 MED ORDER — SODIUM CHLORIDE 0.9 % IV SOLN
222.9000 mg | Freq: Once | INTRAVENOUS | Status: AC
  Administered 2020-09-07: 220 mg via INTRAVENOUS
  Filled 2020-09-07: qty 22

## 2020-09-07 MED ORDER — DIPHENHYDRAMINE HCL 50 MG/ML IJ SOLN: 50.0000 mg | Freq: Once | INTRAMUSCULAR | Status: DC

## 2020-09-07 MED ORDER — SODIUM CHLORIDE 0.9 % IV SOLN
Freq: Once | INTRAVENOUS | Status: AC
  Filled 2020-09-07: qty 250

## 2020-09-07 MED ORDER — PALONOSETRON HCL INJECTION 0.25 MG/5ML
0.2500 mg | Freq: Once | INTRAVENOUS | Status: AC
  Administered 2020-09-07: 0.25 mg via INTRAVENOUS
  Filled 2020-09-07: qty 5

## 2020-09-07 MED ORDER — DIPHENHYDRAMINE HCL 50 MG/ML IJ SOLN
50.0000 mg | Freq: Once | INTRAMUSCULAR | Status: AC
  Administered 2020-09-07: 50 mg via INTRAVENOUS
  Filled 2020-09-07: qty 1

## 2020-09-07 MED ORDER — FAMOTIDINE 20 MG IN NS 100 ML IVPB
20.0000 mg | Freq: Once | INTRAVENOUS | Status: AC
  Administered 2020-09-07: 20 mg via INTRAVENOUS
  Filled 2020-09-07: qty 100

## 2020-09-07 MED ORDER — SODIUM CHLORIDE 0.9 % IV SOLN
60.0000 mg/m2 | Freq: Once | INTRAVENOUS | Status: AC
  Administered 2020-09-07: 114 mg via INTRAVENOUS
  Filled 2020-09-07: qty 19

## 2020-09-07 NOTE — Patient Instructions (Signed)
[facility]ICAL [facility]COLOG[facility]  Discharge Instructions: Thank you for choosing [facility] to provide your oncology and hematology care.   If you have a lab appointment with the Cancer Center, please go directly to the Cancer Center and check in at the registration area.   Wear comfortable clothing and clothing appropriate for easy access to any Portacath or PICC line.   We strive to give you quality time with your provider. You may need to reschedule your appointment if you arrive late (15 or more minutes).  Arriving late affects you and other patients whose appointments are after yours.  Also, if you miss three or more appointments without notifying the office, you may be dismissed from the clinic at the provider's discretion.      For prescription refill requests, have your pharmacy contact our office and allow 72 hours for refills to be completed.    Today you received the following chemotherapy and/or immunotherapy agents: Paclitaxel (Taxol) and Carboplatin.   To help prevent nausea and vomiting after your treatment, we encourage you to take your nausea medication as directed.  BELOW ARE SYMPTOMS THAT SHOULD BE REPORTED IMMEDIATELY: *FEVER GREATER THAN 100.4 F (38 C) OR HIGHER *CHILLS OR SWEATING *NAUSEA AND VOMITING THAT IS NOT CONTROLLED WITH YOUR NAUSEA MEDICATION *UNUSUAL SHORTNESS OF BREATH *UNUSUAL BRUISING OR BLEEDING *URINARY PROBLEMS (pain or burning when urinating, or frequent urination) *BOWEL PROBLEMS (unusual diarrhea, constipation, pain near the anus) TENDERNESS IN MOUTH AND THROAT WITH OR WITHOUT PRESENCE OF ULCERS (sore throat, sores in mouth, or a toothache) UNUSUAL RASH, SWELLING OR PAIN  UNUSUAL VAGINAL DISCHARGE OR ITCHING   Items with * indicate a potential emergency and should be followed up as soon as possible or go to the Emergency Department if any problems should occur.  Please show the CHEMOTHERAPY ALERT CARD or IMMUNOTHERAPY  ALERT CARD at check-in to the Emergency Department and triage nurse.  Should you have questions after your visit or need to cancel or reschedule your appointment, please contact [facility]  Dept: [phone]  and follow the prompts.  Office hours are 8:00 a.m. to 4:30 p.m. Monday - Friday. Please note that voicemails left after 4:00 p.m. may not be returned until the following business day.  We are closed weekends and major holidays. You have access to a nurse at all times for urgent questions. Please call the main number to the clinic Dept: [phone] and follow the prompts.   For any non-urgent questions, you may also contact your provider using MyChart. We now offer e-Visits for anyone 18 and older to request care online for non-urgent symptoms. For details visit [email or web address].   Also download the MyChart app! Go to the app store, search "MyChart", open the app, select [facility], and log in with your MyChart username and password.  Due to Covid, a mask is required upon entering the hospital/clinic. If you do not have a mask, one will be given to you upon arrival. For doctor visits, patients may have 1 support person aged 18 or older with them. For treatment visits, patients cannot have anyone with them due to current Covid guidelines and our immunocompromised population.   

## 2020-09-08 ENCOUNTER — Inpatient Hospital Stay: Payer: Self-pay

## 2020-09-08 ENCOUNTER — Other Ambulatory Visit (HOSPITAL_COMMUNITY): Payer: Self-pay

## 2020-09-08 VITALS — BP 112/80 | HR 98 | Temp 98.8°F | Resp 17

## 2020-09-08 DIAGNOSIS — D500 Iron deficiency anemia secondary to blood loss (chronic): Secondary | ICD-10-CM

## 2020-09-08 DIAGNOSIS — D702 Other drug-induced agranulocytosis: Secondary | ICD-10-CM

## 2020-09-08 MED ORDER — FILGRASTIM-SNDZ 300 MCG/0.5ML IJ SOSY
300.0000 ug | PREFILLED_SYRINGE | Freq: Once | INTRAMUSCULAR | Status: AC
  Administered 2020-09-08: 300 ug via SUBCUTANEOUS

## 2020-09-08 NOTE — Patient Instructions (Signed)
Filgrastim, G-CSF injection Qu es este medicamento? El FILGRASTIM, G-CSF es un factor estimulante de colonias de granulocitos que estimula el crecimiento de los neutrfilos, un tipo de glbulo blanco importante en la lucha del cuerpo contra las infecciones. [place] para reducir la incidencia de fiebre e infeccin en pacientes con ciertos tipos de cncer que estn recibiendo quimioterapia que afecta la mdula sea, para estimular la produccin de clulas sanguneas a fin de eliminar los glbulos blancos del cuerpo antes de un trasplante de mdula sea, para reducir la incidencia de fiebre e infeccin en pacientes que tienen neutropenia crnica grave, y para [employer] de supervivencia despus de exposicin a radiacin dedosis alta que es txica para la mdula sea. Este medicamento puede ser utilizado para otros usos; si tiene [place] preguntaconsulte con su proveedor de atencin mdica o con su farmacutico. MARCAS COMUNES: Neupogen, Nivestym, Releuko, Zarxio [employer] debo informar a mi profesional de la salud antes de tomar estemedicamento? Necesitan saber si usted presenta alguno de los siguientes problemas osituaciones: enfermedad renal alergia al ltex terapia de radiacin en curso enfermedad de clulas falciformes una reaccin alrgica o inusual al filgrastim, pegfilgrastim, a otros medicamentos, alimentos, colorantes o conservantes siest embarazada o buscando quedar embarazada si est amamantando a un beb Cmo debo utilizar este medicamento? Este medicamento se administra mediante una inyeccin por va subcutnea o mediante infusin por va intravenosa. Cuando es mediante infusin por va intravenosa, generalmente lo administra un profesional de la salud en un hospital o en un entorno clnico. Si recibe [employer] en su casa, le ensearn cmo prepararlo y administrarlo. Consulte las Instrucciones de uso que vienen con el envase de su medicamento. Use el medicamento exactamente  como se le indique. Use su medicamento a intervalos regulares. No use su medicamentocon una frecuencia mayor a la indicada. Es importante que deseche las agujas y las jeringas usadas en un recipiente resistente a los pinchazos. No las deseche en la basura. Si no tiene un recipiente resistente a los pinchazos, llame a su farmacutico o proveedor deatencin de la salud para obtenerlo. Hable con su pediatra para informarse acerca del uso de este medicamento en nios. Aunque este medicamento se puede recetar a nios tan pequeos como de 21mses de edad con ciertas afecciones, existen precauciones que deben tomarse. Sobredosis: Pngase en contacto inmediatamente con un centro toxicolgico o unasala de urgencia si usted cree que haya tomado demasiado medicamento. ATENCIN: EConAgra Foodses solo para usted. No comparta este medicamento connadie. Qu sucede si me olvido de una dosis? Es importante no olvidar ninguna dosis. Hable con su mdico o profesional de lasalud si olvida una dosis. Qu puede interactuar con este medicamento? Este medicamento podra interactuar con los siguientes frmacos: medicamentos que pueden causar una liberacin de neutrfilos, tales como litio Puede ser que esta lista no menciona todas las posibles interacciones. Informe a su profesional de lKB Home	[place] tAES Corporationproductos a base de hierbas, medicamentos de vValparaisoo suplementos nutritivos que est tomando. Si usted fuma, consume bebidas alcohlicas o si utiliza drogas ilegales, indqueselo tambin a su profesional de lKB Home	[place] Algunas sustancias pueden interactuar consu medicamento. A qu debo estar atento al usar eCoca-Cola Se supervisar su estado de salud atentamente mientras reciba este medicamento. Usted podra necesitar realizarse anlisis de sangre mientras est usando estemedicamento. Hable con su proveedor de atencin mdica sobre su riesgo de cncer. Ustedpuede tener mayor riesgo para ciertos tipos de  cncer si uCanadaeste medicamento. Qu efectos secundarios puedo  tener al [employer] este medicamento? Efectos secundarios que debe informar a su mdico o a su profesional de lasalud tan pronto como sea posible: Chief of Staff, tales como erupcin cutnea, comezn/picazn o urticaria, e hinchazn de la cara, los labios o la Holiday representative de espalda mareos o sensacin de Marine scientist, enrojecimiento o Actor de la inyeccin puntos rojos en la piel falta de aire o problemas respiratorios signos y sntomas de lesin al rin, tales como dificultad para Garment/textile technologist, cambios en la cantidad de [place], u orina color rojo o Forensic psychologist oscuro dolor de Paramedic o del costado, o Social research officer, government en el hombro hinchazn cansancio sangrado omoretones inusuales Efectos secundarios que generalmente no requieren atencin mdica (infrmelos asu mdico o a Barrister's clerk de la salud si persisten o si son molestos): dolor de huesos tos diarrea cada del cabello dolor de Research officer, trade union Puede ser que esta lista no menciona todos los posibles efectos secundarios. Comunquese a su mdico por asesoramiento mdico [employer]. Usted puede informar los efectos secundarios a la FDA por telfono al1-800-FDA-1088. Dnde debo guardar mi medicina? Mantenga fuera del alcance de los nios. Guarde en el refrigerador a una temperatura de [place] 2 y 61 grados Celsius (entre 39 y 20 grados Fahrenheit). No congele. Mantenga en el envase para proteger de Naval architect. Deseche este medicamento si los frascos o jeringas quedan fuera del refrigerador durante ms de 24 horas. Deseche todo el medicamento queno haya utilizado despus de la fecha de vencimiento. ATENCIN: Este folleto es un resumen. Puede ser que no cubra toda la posible informacin. Si usted tiene preguntas acerca de esta medicina, consulte con sumdico, su farmacutico o su profesional de Technical sales engineer.  2022 Elsevier/Gold Standard (2019-07-19 00:00:00)

## 2020-09-13 ENCOUNTER — Encounter: Payer: Self-pay | Admitting: Hematology

## 2020-09-13 ENCOUNTER — Encounter: Payer: Self-pay | Admitting: Physician Assistant

## 2020-09-14 ENCOUNTER — Encounter: Payer: Self-pay | Admitting: Internal Medicine

## 2020-09-14 ENCOUNTER — Ambulatory Visit (AMBULATORY_SURGERY_CENTER): Payer: Self-pay | Admitting: Internal Medicine

## 2020-09-14 ENCOUNTER — Other Ambulatory Visit: Payer: Self-pay

## 2020-09-14 VITALS — BP 101/71 | HR 89 | Temp 98.4°F | Resp 14 | Ht 67.0 in | Wt 153.0 lb

## 2020-09-14 DIAGNOSIS — K208 Other esophagitis without bleeding: Secondary | ICD-10-CM

## 2020-09-14 DIAGNOSIS — Z8501 Personal history of malignant neoplasm of esophagus: Secondary | ICD-10-CM

## 2020-09-14 DIAGNOSIS — K209 Esophagitis, unspecified without bleeding: Secondary | ICD-10-CM

## 2020-09-14 DIAGNOSIS — R1314 Dysphagia, pharyngoesophageal phase: Secondary | ICD-10-CM

## 2020-09-14 DIAGNOSIS — K222 Esophageal obstruction: Secondary | ICD-10-CM

## 2020-09-14 MED ORDER — SODIUM CHLORIDE 0.9 % IV SOLN: 500.0000 mL | Freq: Once | INTRAVENOUS | Status: DC

## 2020-09-14 NOTE — Progress Notes (Signed)
Vs by [place].

## 2020-09-14 NOTE — Progress Notes (Signed)
Patient instructed to have clear liquids for one hour, then full liquids today. Tomorrow start dysphagia eating plan, minced and moist foods. Interpreter Lorain Childes present, no questions asked form patient or care partner.

## 2020-09-14 NOTE — Progress Notes (Signed)
Report given to PACU, vss 

## 2020-09-14 NOTE — Progress Notes (Signed)
History and Physical Interval Note:  09/14/2020 10:05 AM  Jimmy Hawkins  has presented today for endoscopic procedure(s), with the diagnosis of  Encounter Diagnoses  Name Primary?   Personal history of esophageal cancer Yes   Pharyngoesophageal dysphagia   .  The various methods of evaluation and treatment have been discussed with the patient and/or family. After consideration of risks, benefits and other options for treatment, the patient has consented to  the endoscopic procedure(s).   The patient's history has been reviewed, patient examined, no change in status, stable for surgery.  I have reviewed the patient's chart and labs.  Questions were answered to the patient's satisfaction.     [name], MD, [facility]

## 2020-09-14 NOTE — Progress Notes (Signed)
Called to room to assist during endoscopic procedure.  Patient ID and intended procedure confirmed with present staff. Received instructions for my participation in the procedure from the performing physician.  

## 2020-09-14 NOTE — Progress Notes (Signed)
New instructions given to pt and amb gi referral put in

## 2020-09-14 NOTE — Progress Notes (Signed)
Interpreter, Iran Feliciano, present during recovery.

## 2020-09-14 NOTE — Progress Notes (Signed)
1018 HR > 100 with esmolol 25 mg given IV, MD updated, vss

## 2020-09-14 NOTE — Op Note (Addendum)
[facility] Patient Name: Jimmy Hawkins Procedure Date: 09/14/2020 10:01 AM MRN: [id number] Endoscopist: [name] , MD Age: 45 Referring MD:  Date of Birth: 01-Jun-1975 Gender: Male Account #: [id number] Procedure:                Upper GI endoscopy Indications:              Dysphagia, Suspected stricture of the esophagus,                            For therapy of esophageal stricture Medicines:                Propofol per Anesthesia, Monitored Anesthesia Care Procedure:                Pre-Anesthesia Assessment:                           - Prior to the procedure, a History and Physical                            was performed, and patient medications and                            allergies were reviewed. The patient's tolerance of                            previous anesthesia was also reviewed. The risks                            and benefits of the procedure and the sedation                            options and risks were discussed with the patient.                            All questions were answered, and informed consent                            was obtained. Prior Anticoagulants: The patient has                            taken no previous anticoagulant or antiplatelet                            agents. ASA Grade Assessment: II - A patient with                            mild systemic disease. After reviewing the risks                            and benefits, the patient was deemed in                            satisfactory condition to undergo the procedure.  After obtaining informed consent, the endoscope was                            passed under direct vision. Throughout the                            procedure, the patient's blood pressure, pulse, and                            oxygen saturations were monitored continuously. The                            GIF [id number] was introduced through the                             mouth, and advanced to the gastric cardia. The                            upper GI endoscopy was accomplished without                            difficulty. The patient tolerated the procedure                            well. Scope In: Scope Out: Findings:                 Esophagitis was found in the middle third of the                            esophagus. Biopsies were taken with a cold forceps                            for histology. Verification of patient                            identification for the specimen was done. Estimated                            blood loss was minimal.                           One severe (stenosis; an endoscope cannot pass)                            stenosis was found 30 to 35 cm from the incisors.                            This stenosis measured 1 cm (inner diameter) x 5 cm                            (in length). The stenosis was traversed after  dilation. A TTS dilator was passed through the                            scope. Dilation with an 12-09-11 mm balloon dilator                            was performed to 13 mm. The dilation site was                            examined and showed moderate improvement in luminal                            narrowing. Estimated blood loss was minimal.                            Biopsies were taken with a cold forceps for                            histology. Verification of patient identification                            for the specimen was done. Estimated blood loss was                            minimal.                           The cardia was normal. Complications:            No immediate complications. Estimated blood loss:                            Minimal. Estimated Blood Loss:     Estimated blood loss was minimal. Impression:               - Esophagitis. Biopsied.                           - Esophageal stenosis. Dilated. Biopsied. 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 post XRT +/- reflux, Candida?) scope passed                            through but styill felt gerip on scope so did not                            advance deeper into stomach to avoid trauma.                           - Normal cardia. Recommendation:           - Patient has a  contact number available for                            emergencies. The signs and symptoms of potential                            delayed complications were discussed with the                            patient. Return to normal activities tomorrow.                            Written discharge instructions were provided to the                            patient.                           - Clear liquids x 1 hour then soft foods rest of                            day. Start prior diet tomorrow.                           - Continue present medications. Sucralfata and bid                            PPI                           - Repeat upper endoscopy 09/19/20 for retreatment.                           - Await pathology results. [name], MD 09/14/2020 10:37:14 AM This report has been signed electronically.

## 2020-09-14 NOTE — Patient Instructions (Addendum)
There is a stricture in the esophagus - a narrow area. It looks like it is due to radiation treatments and not cancer but I took samples to understand.  I stretched it so I think you will swallow better but it needs more stretching so I need you to come back next week to do it again.   I appreciate the opportunity to care for you. [name], MD, FACG  USTED TUVO UN PROCEDIMIENTO ENDOSCPICO HOY EN EL [facility]:   Lea el informe del procedimiento que se le entreg para cualquier pregunta especfica sobre lo que se encontr durante su examen.  Si el informe del examen no responde a sus preguntas, por favor llame a su gastroenterlogo para aclararlo.  Si usted solicit que no se le den [employer] de lo que se Estate manager/land agent en su procedimiento al [employer] va a cuidar, entonces el informe del procedimiento se ha incluido en un sobre sellado para que usted lo revise despus cuando le sea ms conveniente.   LO QUE PUEDE ESPERAR: Algunas sensaciones de hinchazn en el abdomen.  Puede tener ms gases de lo normal.  El caminar puede ayudarle a eliminar el aire que se le puso en el tracto gastrointestinal durante el procedimiento y reducir la hinchazn.  Si le hicieron una endoscopia inferior (como una colonoscopia o una sigmoidoscopia flexible), podra notar manchas de sangre en las heces fecales o en el papel higinico.  Si se someti a una preparacin intestinal para su procedimiento, es posible que no tenga una evacuacin intestinal normal durante [employer].   Tenga en cuenta:  Es posible que note un poco de irritacin y congestin en la nariz o algn drenaje.  Esto es debido al oxgeno [employer] durante su procedimiento.  No hay que preocuparse y esto debe desaparecer ms o Scientist, research (medical).   Despus de la endoscopia superior (EGD)  Vmitos de Biochemist, clinical o material como caf molido   Dolor en el pecho o dolor debajo de los omplatos que antes no tena   Dolor o dificultad  persistente para tragar  Falta de aire que antes no tena   Fiebre de 100F o ms  Heces fecales negras y pegajosas   Para asuntos urgentes o de Freight forwarder, puede comunicarse con un gastroenterlogo a cualquier hora llamando al [phone].  DIETA:  Recomendamos una comida pequea al principio, pero luego puede continuar con su dieta normal.  Tome muchos lquidos, Teacher, adult education las bebidas alcohlicas durante 24 horas.    ACTIVIDAD:  Debe planear tomarse las cosas con calma por el resto del da y no debe CONDUCIR ni usar maquinaria pesada Programmer, applications (debido a los medicamentos de sedacin utilizados durante el examen).     SEGUIMIENTO: Nuestro personal llamar al nmero que aparece en su historial al siguiente da hbil de su procedimiento para ver cmo se siente y para responder cualquier pregunta o inquietud que pueda tener con respecto a la informacin que se le dio despus del procedimiento. Si no podemos contactarle, le dejaremos un mensaje.  Sin embargo, si se siente bien y no tiene Paediatric nurse, no es necesario que nos devuelva la llamada.  Asumiremos que ha regresado a sus actividades diarias normales sin incidentes. Si se le tomaron algunas biopsias, le contactaremos por telfono o por carta en las prximas 3 semanas.  Si no ha sabido [employer] biopsias en el transcurso de 3 semanas, por favor llmenos al [phone].   FIRMAS/CONFIDENCIALIDAD: Waldron Session  y/o el acompaante que le cuide han firmado documentos que se ingresarn en su historial mdico electrnico.  Estas firmas atestiguan el hecho de que la informacin anterior

## 2020-09-14 NOTE — Progress Notes (Signed)
1010 Robinul 0.1 mg IV given due large amount of secretions upon assessment.  MD made aware, vss 

## 2020-09-18 ENCOUNTER — Telehealth: Payer: Self-pay

## 2020-09-18 NOTE — Telephone Encounter (Signed)
  Follow up Call-  Call back number 09/14/2020  Post procedure Call Back phone  # [phone]  Permission to leave phone message Yes  Some recent data might be hidden     Patient questions:  Do you have a fever, pain , or abdominal swelling? No. Pain Score  0 *  Have you tolerated food without any problems? Yes.    Have you been able to return to your normal activities? Yes.    Do you have any questions about your discharge instructions: Diet   No. Medications  No. Follow up visit  No.  Do you have questions or concerns about your Care? No.  Actions: * If pain score is 4 or above: No action needed, pain <4.

## 2020-09-19 ENCOUNTER — Encounter: Payer: Self-pay | Admitting: Internal Medicine

## 2020-09-19 ENCOUNTER — Other Ambulatory Visit: Payer: Self-pay

## 2020-09-19 ENCOUNTER — Ambulatory Visit (AMBULATORY_SURGERY_CENTER): Payer: Self-pay | Admitting: Internal Medicine

## 2020-09-19 VITALS — BP 115/78 | HR 95 | Temp 98.0°F | Resp 21 | Ht 67.0 in | Wt 153.0 lb

## 2020-09-19 DIAGNOSIS — K200 Eosinophilic esophagitis: Secondary | ICD-10-CM

## 2020-09-19 DIAGNOSIS — R1310 Dysphagia, unspecified: Secondary | ICD-10-CM

## 2020-09-19 DIAGNOSIS — K222 Esophageal obstruction: Secondary | ICD-10-CM

## 2020-09-19 DIAGNOSIS — Z8501 Personal history of malignant neoplasm of esophagus: Secondary | ICD-10-CM

## 2020-09-19 MED ORDER — SODIUM CHLORIDE 0.9 % IV SOLN
500.0000 mL | Freq: Once | INTRAVENOUS | Status: DC
Start: 2020-09-19 — End: 2020-09-19

## 2020-09-19 NOTE — Progress Notes (Signed)
To PACU, VSS. Report to [email or web address] 

## 2020-09-19 NOTE — Progress Notes (Signed)
Called to room to assist during endoscopic procedure.  Patient ID and intended procedure confirmed with present staff. Received instructions for my participation in the procedure from the performing physician.  

## 2020-09-19 NOTE — Progress Notes (Signed)
VS completed by CW.  Spanish Interpreter at bedside.   Pt's states no medical or surgical changes since previsit or office visit.

## 2020-09-19 NOTE — Patient Instructions (Addendum)
I dilated or stretched the esophagus some more.  You may move to bigger foods tomorrow. Liquids for 1 hour today then very soft foods ok.  See attached diet for tomorrow.  We need to do this again in early September.  The biopsies did not show cancer, I think the stricture is from the radiation treatment.  I appreciate the opportunity to care for you. [name], MD, FACG   YOU HAD AN ENDOSCOPIC PROCEDURE TODAY AT [facility]:   Refer to the procedure report that was given to you for any specific questions about what was found during the examination.  If the procedure report does not answer your questions, please call your gastroenterologist to clarify.  If you requested that your care partner not be given the details of your procedure findings, then the procedure report has been included in a sealed envelope for you to review at your convenience later.  YOU SHOULD EXPECT: Some feelings of bloating in the abdomen. Passage of more gas than usual.  Walking can help get rid of the air that was put into your GI tract during the procedure and reduce the bloating. If you had a lower endoscopy (such as a colonoscopy or flexible sigmoidoscopy) you may notice spotting of blood in your stool or on the toilet paper. If you underwent a bowel prep for your procedure, you may not have a normal bowel movement for a few days.  Please Note:  You might notice some irritation and congestion in your nose or some drainage.  This is from the oxygen used during your procedure.  There is no need for concern and it should clear up in a day or so.  SYMPTOMS TO REPORT IMMEDIATELY:  Following lower endoscopy (colonoscopy or flexible sigmoidoscopy):  Excessive amounts of blood in the stool  Significant tenderness or worsening of abdominal pains  Swelling of the abdomen that is new, acute  Fever of 100F or higher  Following upper endoscopy (EGD)  Vomiting of blood or coffee ground material  New  chest pain or pain under the shoulder blades  Painful or persistently difficult swallowing  New shortness of breath  Fever of 100F or higher  Black, tarry-looking stools  For urgent or emergent issues, a gastroenterologist can be reached at any hour by calling [phone]. Do not use MyChart messaging for urgent concerns.    DIET:  We do recommend clear liquids x 1 hour, and then a soft diet for the rest of today.  You may resume your regular diet tomorrow.  Then you may proceed to your regular diet.  Drink plenty of fluids but you should avoid alcoholic beverages for 24 hours.  ACTIVITY:  You should plan to take it easy for the rest of today and you should NOT DRIVE or use heavy machinery until tomorrow (because of the sedation medicines used during the test).    FOLLOW UP: Our staff will call the number listed on your records 48-72 hours following your procedure to check on you and address any questions or concerns that you may have regarding the information given to you following your procedure. If we do not reach you, we will leave a message.  We will attempt to reach you two times.  During this call, we will ask if you have developed any symptoms of COVID 19. If you develop any symptoms (ie: fever, flu-like symptoms, shortness of breath, cough etc.) before then, please call [phone].  If you test positive for  Covid 19 in the 2 weeks post procedure, please call and report this information to [name].     SIGNATURES/CONFIDENTIALITY: You and/or your care partner have signed paperwork which will be entered into your electronic medical record.  These signatures attest to the fact that that the information above on your After Visit Summary has been reviewed and is understood.  Full responsibility of the confidentiality of this discharge information lies with you and/or your care-partner.

## 2020-09-19 NOTE — Progress Notes (Signed)
History and Physical Interval Note:  09/19/2020 9:24 AM  Jimmy Hawkins  has presented today for endoscopic procedure(s), with the diagnosis of  Encounter Diagnoses  Name Primary?   Pharyngoesophageal dysphagia Yes   Personal history of esophageal cancer   .  The various methods of evaluation and treatment have been discussed with the patient and/or family. After consideration of risks, benefits and other options for treatment, the patient has consented to  the endoscopic procedure(s).   The patient's history has been reviewed, patient examined, no change in status except improved dysphagia, stable for surgery.  I have reviewed the patient's chart and labs.  Questions were answered to the patient's satisfaction.     [name], MD, [facility]

## 2020-09-19 NOTE — Op Note (Signed)
[place] Patient Name: Jimmy Hawkins Procedure Date: 09/19/2020 9:23 AM MRN: [id number] Endoscopist: [name] , MD Age: 45 Referring MD:  Date of Birth: 12/06/1975 Gender: Male Account #: [id number] Procedure:                Upper GI endoscopy Indications:              Dysphagia, Stricture of the esophagus, For therapy                            of esophageal stricture Medicines:                Propofol per Anesthesia, Monitored Anesthesia Care Procedure:                Pre-Anesthesia Assessment:                           - Prior to the procedure, a History and Physical                            was performed, and patient medications and                            allergies were reviewed. The patient's tolerance of                            previous anesthesia was also reviewed. The risks                            and benefits of the procedure and the sedation                            options and risks were discussed with the patient.                            All questions were answered, and informed consent                            was obtained. Prior Anticoagulants: The patient has                            taken no previous anticoagulant or antiplatelet                            agents. ASA Grade Assessment: III - A patient with                            severe systemic disease. After reviewing the risks                            and benefits, the patient was deemed in                            satisfactory condition to undergo the procedure.  After obtaining informed consent, the endoscope was                            passed under direct vision. Throughout the                            procedure, the patient's blood pressure, pulse, and                            oxygen saturations were monitored continuously. The                            Endoscope was introduced through the mouth, and                             advanced to the second part of duodenum. The upper                            GI endoscopy was accomplished without difficulty.                            The patient tolerated the procedure well. Scope In: Scope Out: Findings:                 One benign-appearing, intrinsic severe (stenosis;                            an endoscope cannot pass) stenosis was found in the                            lower third of the esophagus. This stenosis                            measured 1.1 cm (inner diameter) x 5 cm (in                            length). The stenosis was traversed after dilation.                            A TTS dilator was passed through the scope.                            Dilation with a 13.5-14.5-15.5 mm balloon dilator                            was performed to 15.5 mm. The dilation site was                            examined and showed moderate improvement in luminal                            narrowing. Estimated blood loss was minimal.  Severe esophagitis was found in the lower third of                            the esophagus.                           The gastroesophageal flap valve was visualized                            endoscopically and classified as Hill Grade IV (no                            fold, wide open lumen, hiatal hernia present).                           A small hiatal hernia was present.                           Patchy mildly erythematous mucosa was found in the                            gastric antrum.                           The exam was otherwise without abnormality.                           The cardia and gastric fundus were otherwise normal                            on retroflexion. Complications:            No immediate complications. Estimated Blood Loss:     Estimated blood loss was minimal. Impression:               - Benign-appearing esophageal stenosis. Dilated.                           - Severe  radiation esophagitis. Biopsies from last                            week inflammation no cancer                           - Gastroesophageal flap valve classified as Hill                            Grade IV (no fold, wide open lumen, hiatal hernia                            present).                           - Small hiatal hernia.                           - Erythematous mucosa in the antrum.                           -  The examination was otherwise normal.                           - No specimens collected. Recommendation:           - Patient has a contact number available for                            emergencies. The signs and symptoms of potential                            delayed complications were discussed with the                            patient. Return to normal activities tomorrow.                            Written discharge instructions were provided to the                            patient.                           - Clear liquids x 1 hour then soft foods rest of                            day. Start small bites diet diet tomorrow.                           - Continue present medications.                           - schedule repeat dilation Sept 7 10 AM [name], MD 09/19/2020 9:51:38 AM This report has been signed electronically.

## 2020-09-21 ENCOUNTER — Encounter: Payer: Self-pay | Admitting: Hematology

## 2020-09-21 ENCOUNTER — Other Ambulatory Visit: Payer: Self-pay

## 2020-09-21 ENCOUNTER — Other Ambulatory Visit (HOSPITAL_COMMUNITY): Payer: Self-pay

## 2020-09-21 ENCOUNTER — Encounter: Payer: Self-pay | Admitting: Physician Assistant

## 2020-09-21 ENCOUNTER — Inpatient Hospital Stay: Payer: Self-pay

## 2020-09-21 ENCOUNTER — Telehealth: Payer: Self-pay

## 2020-09-21 ENCOUNTER — Inpatient Hospital Stay: Payer: Self-pay | Admitting: Dietician

## 2020-09-21 ENCOUNTER — Inpatient Hospital Stay (HOSPITAL_BASED_OUTPATIENT_CLINIC_OR_DEPARTMENT_OTHER): Payer: Self-pay | Admitting: Hematology

## 2020-09-21 VITALS — BP 115/78 | HR 115 | Temp 98.6°F | Resp 17 | Ht 67.0 in | Wt 154.0 lb

## 2020-09-21 VITALS — HR 100

## 2020-09-21 DIAGNOSIS — C154 Malignant neoplasm of middle third of esophagus: Secondary | ICD-10-CM

## 2020-09-21 DIAGNOSIS — C7951 Secondary malignant neoplasm of bone: Secondary | ICD-10-CM

## 2020-09-21 LAB — COMPREHENSIVE METABOLIC PANEL
ALT: 13 U/L (ref 0–44)
AST: 25 U/L (ref 15–41)
Albumin: 3.8 g/dL (ref 3.5–5.0)
Alkaline Phosphatase: 79 U/L (ref 38–126)
Anion gap: 9 (ref 5–15)
BUN: 4 mg/dL — ABNORMAL LOW (ref 6–20)
CO2: 23 mmol/L (ref 22–32)
Calcium: 9.4 mg/dL (ref 8.9–10.3)
Chloride: 104 mmol/L (ref 98–111)
Creatinine, Ser: 0.65 mg/dL (ref 0.61–1.24)
GFR, Estimated: >60 mL/min (ref >60)
Glucose, Bld: 113 mg/dL — ABNORMAL HIGH (ref 70–99)
Potassium: 3.7 mmol/L (ref 3.5–5.1)
Sodium: 136 mmol/L (ref 135–145)
Total Bilirubin: 0.6 mg/dL (ref 0.3–1.2)
Total Protein: 7.5 g/dL (ref 6.5–8.1)

## 2020-09-21 LAB — CBC WITH DIFFERENTIAL/PLATELET
Abs Immature Granulocytes: 0.04 10*3/uL (ref 0.00–0.07)
Basophils Absolute: 0 10*3/uL (ref 0.0–0.1)
Basophils Relative: 0 %
Eosinophils Absolute: 0 10*3/uL (ref 0.0–0.5)
Eosinophils Relative: 1 %
HCT: 34.5 % — ABNORMAL LOW (ref 39.0–52.0)
Hemoglobin: 11.8 g/dL — ABNORMAL LOW (ref 13.0–17.0)
Immature Granulocytes: 1 %
Lymphocytes Relative: 10 %
Lymphs Abs: 0.4 10*3/uL — ABNORMAL LOW (ref 0.7–4.0)
MCH: 32.5 pg (ref 26.0–34.0)
MCHC: 34.2 g/dL (ref 30.0–36.0)
MCV: 95 fL (ref 80.0–100.0)
Monocytes Absolute: 1 10*3/uL (ref 0.1–1.0)
Monocytes Relative: 25 %
Neutro Abs: 2.6 10*3/uL (ref 1.7–7.7)
Neutrophils Relative %: 63 %
Platelets: 205 10*3/uL (ref 150–400)
RBC: 3.63 MIL/uL — ABNORMAL LOW (ref 4.22–5.81)
RDW: 15.7 % — ABNORMAL HIGH (ref 11.5–15.5)
WBC: 4.1 10*3/uL (ref 4.0–10.5)
nRBC: 0 % (ref 0.0–0.2)

## 2020-09-21 MED ORDER — MIRTAZAPINE 7.5 MG PO TABS
ORAL_TABLET | ORAL | 1 refills | Status: DC
  Filled 2020-09-21: qty 30, 30d supply, fill #0

## 2020-09-21 MED ORDER — PALONOSETRON HCL INJECTION 0.25 MG/5ML
INTRAVENOUS | Status: AC
  Filled 2020-09-21: qty 5

## 2020-09-21 MED ORDER — SODIUM CHLORIDE 0.9 % IV SOLN: Freq: Once | INTRAVENOUS | Status: AC

## 2020-09-21 MED ORDER — FAMOTIDINE 20 MG IN NS 100 ML IVPB
20.0000 mg | Freq: Once | INTRAVENOUS | Status: AC
  Administered 2020-09-21: 20 mg via INTRAVENOUS

## 2020-09-21 MED ORDER — DIPHENHYDRAMINE HCL 50 MG/ML IJ SOLN
50.0000 mg | Freq: Once | INTRAMUSCULAR | Status: AC
  Administered 2020-09-21: 50 mg via INTRAVENOUS

## 2020-09-21 MED ORDER — SODIUM CHLORIDE 0.9 % IV SOLN
60.0000 mg/m2 | Freq: Once | INTRAVENOUS | Status: AC
  Administered 2020-09-21: 114 mg via INTRAVENOUS
  Filled 2020-09-21: qty 19

## 2020-09-21 MED ORDER — DIPHENHYDRAMINE HCL 50 MG/ML IJ SOLN
INTRAMUSCULAR | Status: AC
  Filled 2020-09-21: qty 1

## 2020-09-21 MED ORDER — FAMOTIDINE 20 MG IN NS 100 ML IVPB
INTRAVENOUS | Status: AC
  Filled 2020-09-21: qty 100

## 2020-09-21 MED ORDER — HYDROCODONE-ACETAMINOPHEN 7.5-325 MG/15ML PO SOLN
10.0000 mL | Freq: Three times a day (TID) | ORAL | 0 refills | Status: DC | PRN
  Filled 2020-09-21: qty 473, 16d supply, fill #0

## 2020-09-21 MED ORDER — CARBOPLATIN CHEMO INJECTION 450 MG/45ML
220.0000 mg | Freq: Once | INTRAVENOUS | Status: AC
  Administered 2020-09-21: 220 mg via INTRAVENOUS
  Filled 2020-09-21: qty 22

## 2020-09-21 MED ORDER — SODIUM CHLORIDE 0.9 % IV SOLN
200.0000 mg | Freq: Once | INTRAVENOUS | Status: AC
  Administered 2020-09-21: 200 mg via INTRAVENOUS
  Filled 2020-09-21: qty 8

## 2020-09-21 MED ORDER — PALONOSETRON HCL INJECTION 0.25 MG/5ML
0.2500 mg | Freq: Once | INTRAVENOUS | Status: AC
  Administered 2020-09-21: 0.25 mg via INTRAVENOUS

## 2020-09-21 NOTE — Progress Notes (Signed)
Pt does not have port. Sent to lab for lab draw peripherally.

## 2020-09-21 NOTE — Telephone Encounter (Signed)
  Follow up Call-  Call back number 09/19/2020 09/14/2020  Post procedure Call Back phone  # [phone]  Permission to leave phone message Yes Yes  Some recent data might be hidden     Patient questions:  Do you have a fever, pain , or abdominal swelling? No. Pain Score  0 *  Have you tolerated food without any problems? Yes.    Have you been able to return to your normal activities? Yes.    Do you have any questions about your discharge instructions: Diet   No. Medications  No. Follow up visit  No.  Do you have questions or concerns about your Care? No.  Actions: * If pain score is 4 or above: No action needed, pain <4.  Have you developed a fever since your procedure? no  2.   Have you had an respiratory symptoms (SOB or cough) since your procedure? no  3.   Have you tested positive for COVID 19 since your procedure no  4.   Have you had any family members/close contacts diagnosed with the COVID 19 since your procedure?  no   If yes to any of these questions please route to Joylene John, RN and Joella Prince, RN

## 2020-09-21 NOTE — Progress Notes (Signed)
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 He completed palliative radiation to sacrum on 3/15. Patient is receiving keytruda/carbo/taxol  Met with patient and interpretor in infusion. He reports swallowing is much improved s/p esophageal dilation on 8/24. Patient reports tolerating regular textures, denies swallowing difficulties. Patient reports his appetite is slow to return, MD has started him on appetite stimulant. Patient continues to drink 3 Ensure Plus daily. He denies nausea, vomiting, diarrhea.    Medications: Remeron  Labs: Glucose 113, BUN 4  Anthropometrics: Weight 154 lb today decreased from 156 lb 8 oz on 8/5 and 158 lb 12 oz on 7/22.   NUTRITION DIAGNOSIS: Inadequate oral intake improving   INTERVENTION:  Encouraged high calorie, high protein foods  Encouraged eating smaller meals and snacks more frequently Reviewed soft, moist high protein foods Take appetite stimulant as prescribed Continue drinking 3 Ensure Plus/equivalent daily, provided coupons Awaiting arrival of Ensure cases, will provide complimentary case to patient as able - pt aware   MONITORING, EVALUATION, GOAL: weight trends, intake   NEXT VISIT: To be scheduled with treatment

## 2020-09-21 NOTE — Progress Notes (Signed)
[place]   [phone] Fax:([phone]   Clinic Follow up Note   Patient Care Team: [name], MD as PCP - General (Hematology) [name], MD as Consulting Physician (Oncology)  Date of Service:  09/21/2020  CHIEF COMPLAINT: f/u of esophageal cancer  ASSESSMENT & PLAN:  Jimmy Hawkins is a 45 y.o. male with   1. 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 08/11/19 CT CAP found him to have mid esophageal mass and enlarged AP window node, no distant metastasis. -He completed 6 weeks of concurrent chemoRT with weekly Carboplatin and Taxol 08/29/19-10/06/19 -Repeat PET scan October 2021 showed hypermetabolic wall thickening of the distal half of the thoracic esophageus, no notable distant metastasis.  -He received CAPOX q3weeks with Xeloda 1575m BID 2 weeks on/1 week off 12/20/19-02/2020. -He completed palliative Radiation to sacrum with Dr MLisbeth Renshaw2/28/22-3/15/22.  -He has started First-line single agent Keytruda 04/27/20. Due to his slow improvement and squamous cell histology, Dr. FBurr Medicoadded chemo with carboplatin and Taxol 2 weeks on/1 week off from C4 on 06/29/20. He receives zarxio as needed for neutropenia. -His PET from 07/17/20 showed Left sacral metastasis is slightly enlarged and slightly increased in peripheral hypermetabolism. No additional evidence of metastatic disease. Will continue current regimen. -Labs reviewed and adequate to proceed with Keytruda and chemo today. We will schedule zarxio for Monday, 8/29 and 9/6. -f/u 3 weeks. Will order restaging scan at that visit.   2. Sacral/back pain, LE weakness, secondary to bone metastasis -He was seen to have bone mets on 04/11/20 PET scan. -He completed palliative Radiation to sacrum with Dr MLisbeth Renshaw2/28/22-3/15/22. He was given short course dexa 475m   -PET 07/17/20 showed enlargement and slight increase in peripheral hypermetabolism to left sacral metastasis. -He is currently taking morphine SR BID and hydrocodone 7.5-10cc BID. -He rates the pain at 4/10 today.   3. H/o Heavy alcohol user -He quit drinking since cancer diagnosis, but started again. Due to weakness, he is not able to drink as much since 04/13/20 and has stopped overall (06/29/20)   4. 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 He has already applied for Medicaid. -He has met with financial advocate Shauna and SW and he has grant assistance with medications. -Will see if he can get drug replacement for Keytruda. -Patient, family and Interpretor notes difficulty with communication. They will set up Mychart for more direct line.    5. Dysphagia and weight loss -He continues to have difficulty swallowing, and as a result has lost weight. -he reports poor appetite. I will refill his mirtazapine. -f/u with dietician     PLAN: -I refilled mirtazapine -proceed with C9 Keytruda, carbo and taxol today, chemo day 8 next week  -add zarxio on 09/24/20 and 10/02/20 -labs and f/u in 3 weeks before next cycle chemo and Keytruda, will order restaging scan on next visit  -I refilled Hydrocordone today   No problem-specific Assessment & Plan notes found for this encounter.   SUMMARY OF ONCOLOGIC HISTORY: Oncology History Overview Note  Cancer Staging Malignant neoplasm of middle third of esophagus (HCP[place]ome HeightsStaging form: Esophagus - Squamous Cell Carcinoma, AJCC 8th Edition - Clinical: Stage Unknown (cTX, cN1, cM0) - Signed by FeTruitt MerleMD on 08/18/2019    Malignant neoplasm of middle third of esophagus ([facility]  Initial  Diagnosis   Malignant neoplasm of middle third of esophagus ([place])   08/11/2019 Imaging   CT CAP W contrast 08/11/19 IMPRESSION: 1. Mid to lower esophageal mass along an 8 cm segment, large endoluminal component,  strongly favoring esophageal malignancy. Borderline enlarged AP window lymph node. No findings of distant metastatic disease. 2. Trace bilateral pleural effusions. 3. Diffuse hepatic steatosis. 4. Cholelithiasis. 5. Fatty spermatic cords likely due to small bilateral indirect inguinal hernias.   08/11/2019 Procedure   EGD by Dr Carlean Purl  IMPRESSION - Partially obstructing, likely malignant esophageal tumor was found in the upper third of the esophagus and in the middle third of the esophagus. Biopsied. Very large and long - difficult but able to advance scope by this lesion - await CT for better length estimate - Normal stomach. - Normal examined duodenum.   08/11/2019 Initial Biopsy   FINAL MICROSCOPIC DIAGNOSIS:   A. ESOPHAGUS, UPPER, BIOPSY:  - Squamous cell carcinoma.    08/11/2019 Genetic Testing   PD-L1 Expression 90%   08/18/2019 Cancer Staging   Staging form: Esophagus - Squamous Cell Carcinoma, AJCC 8th Edition - Clinical: Stage Unknown (cTX, cN1, cM0) - Signed by [name], MD on 08/18/2019   08/29/2019 - 10/04/2019 Chemotherapy   Concurrent chemoradiation with weekly CT for 6 weeks starting 08/29/19-10/04/19    08/29/2019 - 10/06/2019 Radiation Therapy   Concurrent chemoradiation with Dr Lisbeth Renshaw starting 08/29/19-10/06/19   11/11/2019 PET scan   IMPRESSION: 1. Again seen is a long segment of FDG avid wall thickening of the distal half of the thoracic esophagus compatible with known esophageal neoplasm. 2. No signs of FDG avid nodal or distant metastatic disease.     11/28/2019 Procedure   EUS by Dr Rush Landmark IMPRESSION EGD Impression: - No gross lesions in esophagus proximally. - Previously noted esophageal cancer is not present. However, in its place and extending distal to this is likely LA Grade D chemotherapy/radiation esophagitis with bleeding. Biopsied after the EUS was complete to evaluate for persistent disease or not. - Z-line regular, 37 cm from the incisors. - 3 cm  hiatal hernia. - Erythematous mucosa in the stomach. No other gross lesions in the stomach. Biopsied. - No gross lesions in the duodenal bulb, in the first portion of the duodenum and in the second portion of the duodenum. EUS Impression: - Wall thickening was seen in the thoracic esophagus into the gastroesophageal junction. The thickening appeared to primarily be within the luminal interface/superficial mucosa (Layer 1) and deep mucosa (Layer 2). The previously noted esophageal cancer has been replaced by what appears to be radiation/chemotherapy induced changes. With the limitations of having to use the mini-probe EUS, I could not visualize persistent significant disease other than the entire distal area of the esophagus having likely changes noted as above. - No malignant-appearing lymph nodes were visualized in the middle paraesophageal mediastinum (level 48M), lower paraesophageal mediastinum (level 8L), diaphragmatic region (level 15) and paracardial region (level 16).      FINAL MICROSCOPIC DIAGNOSIS:   A. STOMACH, BIOPSY:  - Mildly active chronic gastritis.  - Warthin-Starry negative for Helicobacter pylori.  - No intestinal metaplasia, dysplasia or carcinoma.   B. ESOPHAGUS, BIOPSY:  - Inflamed granulation tissue and exudate consistent with ulcer.  - No malignancy identified.    12/20/2019 - 02/2020 Chemotherapy   CAPOX q3weeks with Xeloda 1554m BID 2 weeks on/1 week off starting 12/20/19-02/2020   03/21/2020 [facility]   [facility]--Circumferential thickening of the lower third of the  esophagus which corresponds with known esophageal malignancy. No evidence of metastatic disease within the chest.  --For findings below the diaphragm, please see concurrent separately reported CT of the abdomen and pelvis.  --Expansile lytic mass with associated soft tissue component centered in the left sacral ala and abutting the left S1 and S2 nerve roots, left internal iliac  vasculature, and left pyriformis muscle with no other metastatic disease within the abdomen or pelvis.   --Diffuse thickening of the distal esophagus compatible with known esophageal malignancy.   --Cholelithiasis with nonspecific gallbladder distention. Clinical correlation is recommended. If there is clinical concern for cholecystitis, further evaluation with ultrasound can be obtained.   03/21/2020 Imaging   CT Lumbar Spine  Expansile lytic lesion centered in the left sacral ala with no other evidence of metastatic disease in the lumbar spine.   03/26/2020 - 04/10/2020 Radiation Therapy   Palliative Radiation to Sacrum with Dr Lisbeth Renshaw 03/26/20-04/10/20.    04/11/2020 Progression   PET  IMPRESSION: 1. New hypermetabolic expansile 6.1 cm upper left sacral lytic bone metastasis, centrally necrotic. 2. New hypermetabolic left level 1b and left level 2 neck lymph nodes, indeterminate for reactive versus metastatic nodes. 3. Persistent hypermetabolism associated with a short segment of mid to lower thoracic esophagus with associated circumferential esophageal wall thickening, similar in metabolism and decreased in wall thickness since 11/11/2019 PET-CT, nonspecific, favor post treatment change, with residual neoplasm not excluded. 4. No additional sites of hypermetabolic metastatic disease. 5.  Chronic findings include: Cholelithiasis.   04/27/2020 -  Chemotherapy   First-line with immunotherapy Keytruda q3weeks starting 04/27/20       06/29/2020 -  Chemotherapy   Weekly carbo AUC 1.5 and Taxol 31m/m2 on day 1, 8 every 21 days added to first-line Keytruda from C4 on 06/29/20.     07/17/2020 PET scan   IMPRESSION: 1. Left sacral metastasis is slightly enlarged and slightly increased in peripheral hypermetabolism. No additional evidence of metastatic disease. 2. Similar mild hypermetabolism associated with the mid esophagus. No residual hypermetabolism within left neck lymph nodes. 3. Small  pericardial effusion. 4. Trace bilateral pleural effusions. 5. Cholelithiasis.     09/14/2020 Procedure   Upper Endoscopy  Impression: - Esophagitis. Biopsied. - Esophageal stenosis. Dilated. Biopsied. this looks most like a mid-distal inflammatory stricture (? post XRT +/- reflux, Candida?) scope passed through but styill felt gerip on scope so did not advance deeper into stomach to avoid trauma. - Normal cardia.    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 - NO EVIDENCE OF DYSPLASIA OR MALIGNANCY. - SEE MICROSCOPIC DESCRIPTION. Microscopic Comment While histologic changes of radiation effects are not apparent in the inflamed granulation tissue, the presence of ulceration can be indicative of radiation effects.      CURRENT THERAPY:  First-line Keytruda q3weeks starting 04/27/20, added weekly carbo AUC 1.5 and Taxol 677mm2 from cycle 4 (06/29/20) on day 1, 8 every 21 days   INTERVAL HISTORY:  ViJourden Gilsons here for a follow up of esophageal cancer. He was last seen by me on 08/31/20. He presents to the clinic accompanied by his wife and an interpreter. He reports he is eating better since undergoing esophageal dilation. He rates his pain at 4/10 today. He endorses using the morphine twice a day. He notes he drinks 7.5 cc of hydrocodone at a time (this is the third line on the measuring cup he uses). He reports poor appetite and requests a  medication. He notes he has not been taking the mirtazapine for the last 2 months. He reports he sleep well. He denies any cough or LE edema. He notes some swelling in his throat from the procedure, but this is improving.   All other systems were reviewed with the patient and are negative.  MEDICAL HISTORY:  Past Medical History:  Diagnosis Date   Blood transfusion without reported diagnosis    Cancer ([place])    GERD (gastroesophageal reflux disease)     Hematemesis with nausea 08/10/2019    SURGICAL HISTORY: Past Surgical History:  Procedure Laterality Date   BIOPSY  08/11/2019   Procedure: BIOPSY;  Surgeon: [name], MD;  Location[facility] ENDOSCOPY;  Service: Endoscopy;;   BIOPSY  11/28/2019   Procedure: BIOPSY;  Surgeon: [name], MD;  Location: [facility] ENDOSCOPY;  Service: Gastroenterology;;   ESOPHAGOGASTRODUODENOSCOPY  08/11/2019   ESOPHAGOGASTRODUODENOSCOPY (EGD) WITH PROPOFOL N/A 08/11/2019   Procedure: ESOPHAGOGASTRODUODENOSCOPY (EGD) WITH PROPOFOL;  Surgeon: [name], MD;  Location: [place];  Service: Endoscopy;  Laterality: N/A;   ESOPHAGOGASTRODUODENOSCOPY (EGD) WITH PROPOFOL N/A 11/28/2019   Procedure: ESOPHAGOGASTRODUODENOSCOPY (EGD) WITH PROPOFOL;  Surgeon: Rush Landmark Telford Nab., MD;  Location: [place];  Service: Gastroenterology;  Laterality: N/A;   EUS N/A 11/28/2019   Procedure: UPPER ENDOSCOPIC ULTRASOUND (EUS) RADIAL;  Surgeon: [name], MD;  Location: [place];  Service: Gastroenterology;  Laterality: N/A;    I have reviewed the social history and family history with the patient and they are unchanged from previous note.  ALLERGIES:  has No Known Allergies.  MEDICATIONS:  Current Outpatient Medications  Medication Sig Dispense Refill   diphenhydrAMINE-APAP, sleep, (TYLENOL PM EXTRA STRENGTH) 50-1000 MG/30ML LIQD Take 15 mLs by mouth at bedtime as needed (sleep).     famotidine (PEPCID) 20 MG tablet TOME 1 PASTILLA POR VIA ORAL DOS VECES AL DIA 60 tablet 3   Ferrous Sulfate (IRON) 325 (65 Fe) MG TABS Take 1 tablet (325 mg total) by mouth 2 (two) times daily. 60 tablet 3   ferrous sulfate 325 (65 FE) MG tablet TAKE 1 TABLET BY MOUTH 2 TIMES DAILY. 60 tablet 3   gabapentin (NEURONTIN) 300 MG capsule Take 1 capsule (300 mg total) by mouth 3 (three) times daily. 90 capsule 0   HYDROcodone-acetaminophen (HYCET) 7.5-325 mg/15 ml solution Take 10 mLs by mouth every 8 (eight)  hours as needed for moderate pain. 473 mL 0   mirtazapine (REMERON) 7.5 MG tablet TOME 1 PASTILLA POR VIA ORAL A LA HORA DE ACOSTARSE 30 tablet 1   morphine (MS CONTIN) 30 MG 12 hr tablet Take 1 tablet (30 mg total) by mouth every 8 (eight) hours. 90 tablet 0   pantoprazole (PROTONIX) 40 MG tablet Take 1 tablet (40 mg total) by mouth 2 (two) times daily before a meal. 60 tablet 3   prochlorperazine (COMPAZINE) 10 MG tablet Take 1 tablet (10 mg total) by mouth every 6 (six) hours as needed for nausea or vomiting. 60 tablet 2   sucralfate (CARAFATE) 1 g tablet Take 1 tablet by mouth 4 times daily. (Dissolve in 15 mls of water before taking) 120 tablet 2   No current facility-administered medications for this visit.    PHYSICAL EXAMINATION: ECOG PERFORMANCE STATUS: 2 - Symptomatic, <50% confined to bed  Vitals:   09/21/20 1320  BP: 115/78  Pulse: (!) 115  Resp: 17  Temp: 98.6 F (37 C)  SpO2: 98%   Wt Readings from Last 3  Encounters:  09/21/20 154 lb (69.9 kg)  09/19/20 153 lb (69.4 kg)  09/14/20 153 lb (69.4 kg)     GENERAL:alert, no distress and comfortable SKIN: skin color, texture, turgor are normal, no rashes or significant lesions EYES: normal, Conjunctiva are pink and non-injected, sclera clear  NECK: supple, thyroid normal size, non-tender, without nodularity LYMPH:  no palpable lymphadenopathy in the cervical, axillary  LUNGS: clear to auscultation and percussion with normal breathing effort HEART: regular rate & rhythm and no murmurs and no lower extremity edema ABDOMEN:abdomen soft, non-tender and normal bowel sounds Musculoskeletal:no cyanosis of digits and no clubbing  NEURO: alert & oriented x 3 with fluent speech, no focal motor/sensory deficits  LABORATORY DATA:  I have reviewed the data as listed CBC Latest Ref Rng & Units 09/21/2020 09/07/2020 08/31/2020  WBC 4.0 - 10.5 K/uL 4.1 4.2 2.8(L)  Hemoglobin 13.0 - 17.0 g/dL 11.8(L) 11.9(L) 12.5(L)  Hematocrit 39.0 -  52.0 % 34.5(L) 33.4(L) 36.3(L)  Platelets 150 - 400 K/uL 205 204 215     CMP Latest Ref Rng & Units 09/21/2020 09/07/2020 08/31/2020  Glucose 70 - 99 mg/dL 113(H) 93 107(H)  BUN 6 - 20 mg/dL 4(L) <4(L) 7  Creatinine 0.61 - 1.24 mg/dL 0.65 0.58(L) 0.64  Sodium 135 - 145 mmol/L 136 138 138  Potassium 3.5 - 5.1 mmol/L 3.7 4.0 3.6  Chloride 98 - 111 mmol/L 104 105 103  CO2 22 - 32 mmol/L '23 24 25  ' Calcium 8.9 - 10.3 mg/dL 9.4 9.1 9.7  Total Protein 6.5 - 8.1 g/dL 7.5 7.7 7.5  Total Bilirubin 0.3 - 1.2 mg/dL 0.6 0.5 0.6  Alkaline Phos 38 - 126 U/L 79 88 79  AST 15 - 41 U/L '25 24 21  ' ALT 0 - 44 U/L '13 24 11      ' RADIOGRAPHIC STUDIES: I have personally reviewed the radiological images as listed and agreed with the findings in the report. No results found.    No orders of the defined types were placed in this encounter.  All questions were answered. The patient knows to call the clinic with any problems, questions or concerns. No barriers to learning was detected. The total time spent in the appointment was 30 minutes.     [name], MD 09/21/2020   I, Wilburn Mylar, am acting as scribe for [name], MD.   I have reviewed the above documentation for accuracy and completeness, and I agree with the above.

## 2020-09-21 NOTE — Patient Instructions (Signed)
[facility] ONCOLOGY  Discharge Instructions: Thank you for choosing [facility] to provide your oncology and hematology care.   If you have a lab appointment with the [facility], please go directly to the [facility] and check in at the registration area.   Wear comfortable clothing and clothing appropriate for easy access to any Portacath or PICC line.   We strive to give you quality time with your provider. You may need to reschedule your appointment if you arrive late (15 or more minutes).  Arriving late affects you and other patients whose appointments are after yours.  Also, if you miss three or more appointments without notifying the office, you may be dismissed from the clinic at the provider's discretion.      For prescription refill requests, have your pharmacy contact our office and allow 72 hours for refills to be completed.    Today you received the following chemotherapy and/or immunotherapy agents paclitaxel, keytruda, carboplatin      To help prevent nausea and vomiting after your treatment, we encourage you to take your nausea medication as directed.  BELOW ARE SYMPTOMS THAT SHOULD BE REPORTED IMMEDIATELY: *FEVER GREATER THAN 100.4 F (38 C) OR HIGHER *CHILLS OR SWEATING *NAUSEA AND VOMITING THAT IS NOT CONTROLLED WITH YOUR NAUSEA MEDICATION *UNUSUAL SHORTNESS OF BREATH *UNUSUAL BRUISING OR BLEEDING *URINARY PROBLEMS (pain or burning when urinating, or frequent urination) *BOWEL PROBLEMS (unusual diarrhea, constipation, pain near the anus) TENDERNESS IN MOUTH AND THROAT WITH OR WITHOUT PRESENCE OF ULCERS (sore throat, sores in mouth, or a toothache) UNUSUAL RASH, SWELLING OR PAIN  UNUSUAL VAGINAL DISCHARGE OR ITCHING   Items with * indicate a potential emergency and should be followed up as soon as possible or go to the Emergency Department if any problems should occur.  Please show the CHEMOTHERAPY ALERT CARD or IMMUNOTHERAPY  ALERT CARD at check-in to the Emergency Department and triage nurse.  Should you have questions after your visit or need to cancel or reschedule your appointment, please contact [facility]  Dept: [phone]  and follow the prompts.  Office hours are 8:00 a.m. to 4:30 p.m. Monday - Friday. Please note that voicemails left after 4:00 p.m. may not be returned until the following business day.  We are closed weekends and major holidays. You have access to a nurse at all times for urgent questions. Please call the main number to the clinic Dept: [phone] and follow the prompts.   For any non-urgent questions, you may also contact your provider using MyChart. We now offer e-Visits for anyone 43 and older to request care online for non-urgent symptoms. For details visit [email or web address].   Also download the MyChart app! Go to the app store, search "MyChart", open the app, select [facility], and log in with your MyChart username and password.  Due to Covid, a mask is required upon entering the hospital/clinic. If you do not have a mask, one will be given to you upon arrival. For doctor visits, patients may have 1 support person aged 16 or older with them. For treatment visits, patients cannot have anyone with them due to current Covid guidelines and our immunocompromised population.

## 2020-09-22 ENCOUNTER — Encounter: Payer: Self-pay | Admitting: Hematology

## 2020-09-22 NOTE — Addendum Note (Signed)
Addended by: [name] on: 09/22/2020 08:22 AM   Modules accepted: Orders

## 2020-09-24 ENCOUNTER — Inpatient Hospital Stay: Payer: Self-pay

## 2020-09-24 ENCOUNTER — Telehealth: Payer: Self-pay | Admitting: Hematology

## 2020-09-24 ENCOUNTER — Other Ambulatory Visit: Payer: Self-pay

## 2020-09-24 VITALS — BP 125/83 | HR 100 | Temp 98.9°F | Resp 18

## 2020-09-24 DIAGNOSIS — D702 Other drug-induced agranulocytosis: Secondary | ICD-10-CM

## 2020-09-24 DIAGNOSIS — D500 Iron deficiency anemia secondary to blood loss (chronic): Secondary | ICD-10-CM

## 2020-09-24 MED ORDER — FILGRASTIM-SNDZ 300 MCG/0.5ML IJ SOSY
300.0000 ug | PREFILLED_SYRINGE | Freq: Once | INTRAMUSCULAR | Status: AC
  Administered 2020-09-24: 300 ug via SUBCUTANEOUS
  Filled 2020-09-24: qty 0.5

## 2020-09-24 NOTE — Telephone Encounter (Signed)
Scheduled follow-up appointments per 8/26 los. Patient's neice is aware.

## 2020-09-24 NOTE — Patient Instructions (Signed)
Filgrastim, G-CSF injection Qu es este medicamento? El FILGRASTIM, G-CSF es un factor estimulante de colonias de granulocitos que estimula el crecimiento de los neutrfilos, un tipo de glbulo blanco importante en la lucha del cuerpo contra las infecciones. [place] para reducir la incidencia de fiebre e infeccin en pacientes con ciertos tipos de cncer que estn recibiendo quimioterapia que afecta la mdula sea, para estimular la produccin de clulas sanguneas a fin de eliminar los glbulos blancos del cuerpo antes de un trasplante de mdula sea, para reducir la incidencia de fiebre e infeccin en pacientes que tienen neutropenia crnica grave, y para [employer] de supervivencia despus de exposicin a radiacin dedosis alta que es txica para la mdula sea. Este medicamento puede ser utilizado para otros usos; si tiene [place] preguntaconsulte con su proveedor de atencin mdica o con su farmacutico. MARCAS COMUNES: Neupogen, Nivestym, Releuko, Zarxio [employer] debo informar a mi profesional de la salud antes de tomar estemedicamento? Necesitan saber si usted presenta alguno de los siguientes problemas osituaciones: enfermedad renal alergia al ltex terapia de radiacin en curso enfermedad de clulas falciformes una reaccin alrgica o inusual al filgrastim, pegfilgrastim, a otros medicamentos, alimentos, colorantes o conservantes siest embarazada o buscando quedar embarazada si est amamantando a un beb Cmo debo utilizar este medicamento? Este medicamento se administra mediante una inyeccin por va subcutnea o mediante infusin por va intravenosa. Cuando es mediante infusin por va intravenosa, generalmente lo administra un profesional de la salud en un hospital o en un entorno clnico. Si recibe [employer] en su casa, le ensearn cmo prepararlo y administrarlo. Consulte las Instrucciones de uso que vienen con el envase de su medicamento. Use el medicamento exactamente  como se le indique. Use su medicamento a intervalos regulares. No use su medicamentocon una frecuencia mayor a la indicada. Es importante que deseche las agujas y las jeringas usadas en un recipiente resistente a los pinchazos. No las deseche en la basura. Si no tiene un recipiente resistente a los pinchazos, llame a su farmacutico o proveedor deatencin de la salud para obtenerlo. Hable con su pediatra para informarse acerca del uso de este medicamento en nios. Aunque este medicamento se puede recetar a nios tan pequeos como de 65mses de edad con ciertas afecciones, existen precauciones que deben tomarse. Sobredosis: Pngase en contacto inmediatamente con un centro toxicolgico o unasala de urgencia si usted cree que haya tomado demasiado medicamento. ATENCIN: EConAgra Foodses solo para usted. No comparta este medicamento connadie. Qu sucede si me olvido de una dosis? Es importante no olvidar ninguna dosis. Hable con su mdico o profesional de lasalud si olvida una dosis. Qu puede interactuar con este medicamento? Este medicamento podra interactuar con los siguientes frmacos: medicamentos que pueden causar una liberacin de neutrfilos, tales como litio Puede ser que esta lista no menciona todas las posibles interacciones. Informe a su profesional de lKB Home	[place] tAES Corporationproductos a base de hierbas, medicamentos de [place]o suplementos nutritivos que est tomando. Si usted fuma, consume bebidas alcohlicas o si utiliza drogas ilegales, indqueselo tambin a su profesional de lKB Home	[place] Algunas sustancias pueden interactuar consu medicamento. A qu debo estar atento al usar eCoca-Cola Se supervisar su estado de salud atentamente mientras reciba este medicamento. Usted podra necesitar realizarse anlisis de sangre mientras est usando estemedicamento. Hable con su proveedor de atencin mdica sobre su riesgo de cncer. Ustedpuede tener mayor riesgo para ciertos tipos de  cncer si uCanadaeste medicamento. Qu efectos secundarios puedo  tener al [employer] este medicamento? Efectos secundarios que debe informar a su mdico o a su profesional de lasalud tan pronto como sea posible: Chief of Staff, tales como erupcin cutnea, comezn/picazn o urticaria, e hinchazn de la cara, los labios o la Holiday representative de espalda mareos o sensacin de Marine scientist, enrojecimiento o Actor de la inyeccin puntos rojos en la piel falta de aire o problemas respiratorios signos y sntomas de lesin al rin, tales como dificultad para Garment/textile technologist, cambios en la cantidad de [place], u orina color rojo o Forensic psychologist oscuro dolor de Paramedic o del costado, o Social research officer, government en el hombro hinchazn cansancio sangrado omoretones inusuales Efectos secundarios que generalmente no requieren atencin mdica (infrmelos asu mdico o a Barrister's clerk de la salud si persisten o si son molestos): dolor de huesos tos diarrea cada del cabello dolor de Research officer, trade union Puede ser que esta lista no menciona todos los posibles efectos secundarios. Comunquese a su mdico por asesoramiento mdico [employer]. Usted puede informar los efectos secundarios a la FDA por telfono al1-800-FDA-1088. Dnde debo guardar mi medicina? Mantenga fuera del alcance de los nios. Guarde en el refrigerador a una temperatura de [place] 2 y 62 grados Celsius (entre 89 y 57 grados Fahrenheit). No congele. Mantenga en el envase para proteger de Naval architect. Deseche este medicamento si los frascos o jeringas quedan fuera del refrigerador durante ms de 24 horas. Deseche todo el medicamento queno haya utilizado despus de la fecha de vencimiento. ATENCIN: Este folleto es un resumen. Puede ser que no cubra toda la posible informacin. Si usted tiene preguntas acerca de esta medicina, consulte con sumdico, su farmacutico o su profesional de Technical sales engineer.  2022 Elsevier/Gold Standard (2019-07-19 00:00:00)

## 2020-09-25 ENCOUNTER — Encounter: Payer: Self-pay | Admitting: Hematology

## 2020-09-25 ENCOUNTER — Encounter: Payer: Self-pay | Admitting: Physician Assistant

## 2020-09-28 ENCOUNTER — Inpatient Hospital Stay: Payer: Self-pay | Attending: Hematology

## 2020-09-28 ENCOUNTER — Other Ambulatory Visit: Payer: Self-pay

## 2020-09-28 ENCOUNTER — Inpatient Hospital Stay: Payer: Self-pay

## 2020-09-28 VITALS — BP 104/89 | HR 102 | Temp 98.8°F | Resp 17 | Wt 153.1 lb

## 2020-09-28 DIAGNOSIS — Z5112 Encounter for antineoplastic immunotherapy: Secondary | ICD-10-CM | POA: Insufficient documentation

## 2020-09-28 DIAGNOSIS — G893 Neoplasm related pain (acute) (chronic): Secondary | ICD-10-CM | POA: Insufficient documentation

## 2020-09-28 DIAGNOSIS — Z5189 Encounter for other specified aftercare: Secondary | ICD-10-CM | POA: Insufficient documentation

## 2020-09-28 DIAGNOSIS — Z923 Personal history of irradiation: Secondary | ICD-10-CM | POA: Insufficient documentation

## 2020-09-28 DIAGNOSIS — C154 Malignant neoplasm of middle third of esophagus: Secondary | ICD-10-CM

## 2020-09-28 DIAGNOSIS — Z79899 Other long term (current) drug therapy: Secondary | ICD-10-CM | POA: Insufficient documentation

## 2020-09-28 DIAGNOSIS — F1020 Alcohol dependence, uncomplicated: Secondary | ICD-10-CM | POA: Insufficient documentation

## 2020-09-28 DIAGNOSIS — C7951 Secondary malignant neoplasm of bone: Secondary | ICD-10-CM | POA: Insufficient documentation

## 2020-09-28 DIAGNOSIS — R634 Abnormal weight loss: Secondary | ICD-10-CM | POA: Insufficient documentation

## 2020-09-28 DIAGNOSIS — Z5111 Encounter for antineoplastic chemotherapy: Secondary | ICD-10-CM | POA: Insufficient documentation

## 2020-09-28 DIAGNOSIS — R531 Weakness: Secondary | ICD-10-CM | POA: Insufficient documentation

## 2020-09-28 DIAGNOSIS — R1310 Dysphagia, unspecified: Secondary | ICD-10-CM | POA: Insufficient documentation

## 2020-09-28 LAB — CBC WITH DIFFERENTIAL/PLATELET
Abs Immature Granulocytes: 0.19 10*3/uL — ABNORMAL HIGH (ref 0.00–0.07)
Basophils Absolute: 0 10*3/uL (ref 0.0–0.1)
Basophils Relative: 1 %
Eosinophils Absolute: 0.1 10*3/uL (ref 0.0–0.5)
Eosinophils Relative: 2 %
HCT: 32.2 % — ABNORMAL LOW (ref 39.0–52.0)
Hemoglobin: 11.3 g/dL — ABNORMAL LOW (ref 13.0–17.0)
Immature Granulocytes: 4 %
Lymphocytes Relative: 13 %
Lymphs Abs: 0.6 10*3/uL — ABNORMAL LOW (ref 0.7–4.0)
MCH: 32.9 pg (ref 26.0–34.0)
MCHC: 35.1 g/dL (ref 30.0–36.0)
MCV: 93.9 fL (ref 80.0–100.0)
Monocytes Absolute: 0.6 10*3/uL (ref 0.1–1.0)
Monocytes Relative: 13 %
Neutro Abs: 3 10*3/uL (ref 1.7–7.7)
Neutrophils Relative %: 67 %
Platelets: 172 10*3/uL (ref 150–400)
RBC: 3.43 MIL/uL — ABNORMAL LOW (ref 4.22–5.81)
RDW: 14.6 % (ref 11.5–15.5)
WBC: 4.5 10*3/uL (ref 4.0–10.5)
nRBC: 0 % (ref 0.0–0.2)

## 2020-09-28 LAB — COMPREHENSIVE METABOLIC PANEL
ALT: 13 U/L (ref 0–44)
AST: 19 U/L (ref 15–41)
Albumin: 3.6 g/dL (ref 3.5–5.0)
Alkaline Phosphatase: 77 U/L (ref 38–126)
Anion gap: 11 (ref 5–15)
BUN: 4 mg/dL — ABNORMAL LOW (ref 6–20)
CO2: 23 mmol/L (ref 22–32)
Calcium: 9.1 mg/dL (ref 8.9–10.3)
Chloride: 104 mmol/L (ref 98–111)
Creatinine, Ser: 0.6 mg/dL — ABNORMAL LOW (ref 0.61–1.24)
GFR, Estimated: >60 mL/min (ref >60)
Glucose, Bld: 111 mg/dL — ABNORMAL HIGH (ref 70–99)
Potassium: 3.6 mmol/L (ref 3.5–5.1)
Sodium: 138 mmol/L (ref 135–145)
Total Bilirubin: 0.4 mg/dL (ref 0.3–1.2)
Total Protein: 7.2 g/dL (ref 6.5–8.1)

## 2020-09-28 MED ORDER — SODIUM CHLORIDE 0.9 % IV SOLN
222.9000 mg | Freq: Once | INTRAVENOUS | Status: AC
  Administered 2020-09-28: 220 mg via INTRAVENOUS
  Filled 2020-09-28: qty 22

## 2020-09-28 MED ORDER — DIPHENHYDRAMINE HCL 50 MG/ML IJ SOLN
INTRAMUSCULAR | Status: AC
  Filled 2020-09-28: qty 1

## 2020-09-28 MED ORDER — PALONOSETRON HCL INJECTION 0.25 MG/5ML
INTRAVENOUS | Status: AC
  Filled 2020-09-28: qty 5

## 2020-09-28 MED ORDER — SODIUM CHLORIDE 0.9 % IV SOLN
60.0000 mg/m2 | Freq: Once | INTRAVENOUS | Status: AC
  Administered 2020-09-28: 114 mg via INTRAVENOUS
  Filled 2020-09-28: qty 19

## 2020-09-28 MED ORDER — SODIUM CHLORIDE 0.9 % IV SOLN
Freq: Once | INTRAVENOUS | Status: AC
Start: 2020-09-28 — End: 2020-09-28

## 2020-09-28 MED ORDER — PALONOSETRON HCL INJECTION 0.25 MG/5ML
0.2500 mg | Freq: Once | INTRAVENOUS | Status: AC
  Administered 2020-09-28: 0.25 mg via INTRAVENOUS

## 2020-09-28 MED ORDER — FAMOTIDINE 20 MG IN NS 100 ML IVPB
20.0000 mg | Freq: Once | INTRAVENOUS | Status: AC
  Administered 2020-09-28: 20 mg via INTRAVENOUS

## 2020-09-28 MED ORDER — DIPHENHYDRAMINE HCL 50 MG/ML IJ SOLN
50.0000 mg | Freq: Once | INTRAMUSCULAR | Status: AC
  Administered 2020-09-28: 50 mg via INTRAVENOUS

## 2020-09-28 MED ORDER — FAMOTIDINE 20 MG IN NS 100 ML IVPB
INTRAVENOUS | Status: AC
  Filled 2020-09-28: qty 100

## 2020-09-28 NOTE — Patient Instructions (Signed)
[facility]  Discharge Instructions: Thank you for choosing [facility] to provide your oncology and hematology care.   If you have a lab appointment with the Cancer Center, please go directly to the Cancer Center and check in at the registration area.   Wear comfortable clothing and clothing appropriate for easy access to any Portacath or PICC line.   We strive to give you quality time with your provider. You may need to reschedule your appointment if you arrive late (15 or more minutes).  Arriving late affects you and other patients whose appointments are after yours.  Also, if you miss three or more appointments without notifying the office, you may be dismissed from the clinic at the provider's discretion.      For prescription refill requests, have your pharmacy contact our office and allow 72 hours for refills to be completed.    Today you received the following chemotherapy and/or immunotherapy agents: Paclitaxel and Carboplatin      To help prevent nausea and vomiting after your treatment, we encourage you to take your nausea medication as directed.  BELOW ARE SYMPTOMS THAT SHOULD BE REPORTED IMMEDIATELY: *FEVER GREATER THAN 100.4 F (38 C) OR HIGHER *CHILLS OR SWEATING *NAUSEA AND VOMITING THAT IS NOT CONTROLLED WITH YOUR NAUSEA MEDICATION *UNUSUAL SHORTNESS OF BREATH *UNUSUAL BRUISING OR BLEEDING *URINARY PROBLEMS (pain or burning when urinating, or frequent urination) *BOWEL PROBLEMS (unusual diarrhea, constipation, pain near the anus) TENDERNESS IN MOUTH AND THROAT WITH OR WITHOUT PRESENCE OF ULCERS (sore throat, sores in mouth, or a toothache) UNUSUAL RASH, SWELLING OR PAIN  UNUSUAL VAGINAL DISCHARGE OR ITCHING   Items with * indicate a potential emergency and should be followed up as soon as possible or go to the Emergency Department if any problems should occur.  Please show the CHEMOTHERAPY ALERT CARD or IMMUNOTHERAPY ALERT  CARD at check-in to the Emergency Department and triage nurse.  Should you have questions after your visit or need to cancel or reschedule your appointment, please contact [facility]  Dept: [phone]  and follow the prompts.  Office hours are 8:00 a.m. to 4:30 p.m. Monday - Friday. Please note that voicemails left after 4:00 p.m. may not be returned until the following business day.  We are closed weekends and major holidays. You have access to a nurse at all times for urgent questions. Please call the main number to the clinic Dept: [phone] and follow the prompts.   For any non-urgent questions, you may also contact your provider using MyChart. We now offer e-Visits for anyone 18 and older to request care online for non-urgent symptoms. For details visit [email or web address].   Also download the MyChart app! Go to the app store, search "MyChart", open the app, select [facility], and log in with your MyChart username and password.  Due to Covid, a mask is required upon entering the hospital/clinic. If you do not have a mask, one will be given to you upon arrival. For doctor visits, patients may have 1 support person aged 18 or older with them. For treatment visits, patients cannot have anyone with them due to current Covid guidelines and our immunocompromised population.   

## 2020-09-28 NOTE — Progress Notes (Signed)
Per Dr. Burr Medico, "OK To Treat w/HR of 102 today"

## 2020-10-02 ENCOUNTER — Other Ambulatory Visit: Payer: Self-pay

## 2020-10-02 ENCOUNTER — Inpatient Hospital Stay: Payer: Self-pay

## 2020-10-02 VITALS — BP 106/79 | HR 107 | Temp 98.9°F | Resp 18

## 2020-10-02 DIAGNOSIS — D500 Iron deficiency anemia secondary to blood loss (chronic): Secondary | ICD-10-CM

## 2020-10-02 DIAGNOSIS — D702 Other drug-induced agranulocytosis: Secondary | ICD-10-CM

## 2020-10-02 MED ORDER — FILGRASTIM-SNDZ 300 MCG/0.5ML IJ SOSY
300.0000 ug | PREFILLED_SYRINGE | Freq: Once | INTRAMUSCULAR | Status: AC
  Administered 2020-10-02: 300 ug via SUBCUTANEOUS
  Filled 2020-10-02: qty 0.5

## 2020-10-02 NOTE — Patient Instructions (Addendum)
Filgrastim, G-CSF injection Qu es este medicamento? El FILGRASTIM, G-CSF es un factor estimulante de colonias de granulocitos que estimula el crecimiento de los neutrfilos, un tipo de glbulo blanco importante en la lucha del cuerpo contra las infecciones. [place] para reducir la incidencia de fiebre e infeccin en pacientes con ciertos tipos de cncer que estn recibiendo quimioterapia que afecta la mdula sea, para estimular la produccin de clulas sanguneas a fin de eliminar los glbulos blancos del cuerpo antes de un trasplante de mdula sea, para reducir la incidencia de fiebre e infeccin en pacientes que tienen neutropenia crnica grave, y para [employer] de supervivencia despus de exposicin a radiacin de dosis alta que es txica para la mdula sea. Este medicamento puede ser utilizado para otros usos; si tiene alguna pregunta consulte con su proveedor de atencin mdica o con su farmacutico. MARCAS COMUNES: Neupogen, Nivestym, Releuko, Zarxio [employer] debo informar a mi profesional de la salud antes de tomar este medicamento? Necesitan saber si usted presenta alguno de los siguientes problemas o situaciones: enfermedad renal alergia al ltex terapia de radiacin en curso enfermedad de clulas falciformes una reaccin alrgica o inusual al filgrastim, pegfilgrastim, a otros medicamentos, alimentos, colorantes o conservantes si est embarazada o buscando quedar embarazada si est amamantando a un beb Cmo debo utilizar este medicamento? Este medicamento se administra mediante una inyeccin por va subcutnea o mediante infusin por va intravenosa. Cuando es mediante infusin por va intravenosa, generalmente lo administra un profesional de la salud en un hospital o en un entorno clnico. Si recibe Coca-Cola en su casa, le ensearn cmo prepararlo y administrarlo. Consulte las Instrucciones de uso que vienen con el envase de su medicamento. Use el medicamento  exactamente como se le indique. Use su medicamento a intervalos regulares. No use su medicamento con una frecuencia mayor a la indicada. Es importante que deseche las agujas y las jeringas usadas en un recipiente resistente a los pinchazos. No las deseche en la basura. Si no tiene un recipiente resistente a los pinchazos, llame a su farmacutico o proveedor de atencin de la salud para obtenerlo. Hable con su pediatra para informarse acerca del uso de este medicamento en nios. Aunque este medicamento se puede recetar a nios tan pequeos como de 7 meses de edad con ciertas afecciones, existen precauciones que deben tomarse. Sobredosis: Pngase en contacto inmediatamente con un centro toxicolgico o una sala de urgencia si usted cree que haya tomado demasiado medicamento. ATENCIN: [employer] es solo para usted. No comparta este medicamento con nadie. Qu sucede si me olvido de una dosis? Es importante no olvidar ninguna dosis. Hable con su mdico o profesional de la salud si [employer] dosis. Qu puede interactuar con este medicamento? Este medicamento podra interactuar con los siguientes frmacos: medicamentos que pueden causar una liberacin de neutrfilos, tales como litio Puede ser que esta lista no menciona todas las posibles interacciones. Informe a su profesional de [employer]	[place] de [employer] productos a base de hierbas, medicamentos de [place] o suplementos nutritivos que est tomando. Si usted fuma, consume bebidas alcohlicas o si utiliza drogas ilegales, indqueselo tambin a su profesional de KB Home	[place]. Algunas sustancias pueden interactuar con su medicamento. A qu debo estar atento al usar Coca-Cola? Se supervisar su estado de salud atentamente mientras reciba este medicamento. Usted podra necesitar realizarse [employer] de sangre mientras est usando [place]. Hable con su proveedor de atencin mdica sobre su riesgo de cncer. Usted puede tener mayor riesgo  para  ciertos tipos de cncer si [place] este medicamento. Qu efectos secundarios puedo tener al [employer] este medicamento? Efectos secundarios que debe informar a su mdico o a Barrister's clerk de la salud tan pronto como sea posible: Chief of Staff, tales como erupcin cutnea, comezn/picazn o urticaria, e hinchazn de la cara, los labios o la Holiday representative de espalda mareos o sensacin de Marine scientist, enrojecimiento o Actor de la inyeccin puntos rojos en la piel falta de aire o problemas respiratorios signos y sntomas de lesin al rin, tales como dificultad para Garment/textile technologist, cambios en la cantidad de [place], u orina color rojo o Forensic psychologist oscuro dolor de Paramedic o del costado, o Social research officer, government en el hombro hinchazn cansancio sangrado o moretones inusuales Efectos secundarios que generalmente no requieren atencin mdica (infrmelos a su mdico o a Barrister's clerk de la salud si persisten o si son molestos): dolor de huesos tos diarrea cada del cabello dolor de Research officer, trade union Puede ser que esta lista no menciona todos los posibles efectos secundarios. Comunquese a su mdico por asesoramiento mdico [employer]. Usted puede informar los efectos secundarios a la FDA por telfono al 1-800-FDA-1088. Dnde debo guardar mi medicina? Mantenga fuera del alcance de los nios. Guarde en el refrigerador a una temperatura de [place] 2 y 78 grados Celsius (entre 35 y 31 grados Fahrenheit). No congele. Mantenga en el envase para proteger de Naval architect. Deseche este medicamento si los frascos o jeringas quedan fuera del refrigerador durante ms de 24 horas. Deseche todo el medicamento que no haya utilizado despus de la fecha de vencimiento. ATENCIN: Este folleto es un resumen. Puede ser que no cubra toda la posible informacin. Si usted tiene preguntas acerca de esta medicina, consulte con su mdico, su farmacutico o su profesional de Technical sales engineer.  2022 Elsevier/Gold Standard  (2019-07-19 00:00:00)

## 2020-10-03 ENCOUNTER — Ambulatory Visit (AMBULATORY_SURGERY_CENTER): Payer: Self-pay | Admitting: Internal Medicine

## 2020-10-03 ENCOUNTER — Encounter: Payer: Self-pay | Admitting: Internal Medicine

## 2020-10-03 VITALS — BP 109/79 | HR 92 | Temp 97.1°F | Resp 16 | Ht 67.0 in | Wt 153.0 lb

## 2020-10-03 DIAGNOSIS — R1314 Dysphagia, pharyngoesophageal phase: Secondary | ICD-10-CM

## 2020-10-03 DIAGNOSIS — K2289 Other specified disease of esophagus: Secondary | ICD-10-CM

## 2020-10-03 DIAGNOSIS — Z8501 Personal history of malignant neoplasm of esophagus: Secondary | ICD-10-CM

## 2020-10-03 DIAGNOSIS — K222 Esophageal obstruction: Secondary | ICD-10-CM

## 2020-10-03 MED ORDER — SODIUM CHLORIDE 0.9 % IV SOLN
500.0000 mL | Freq: Once | INTRAVENOUS | Status: DC
Start: 2020-10-03 — End: 2020-10-03

## 2020-10-03 NOTE — Op Note (Signed)
[facility] Patient Name: Marshawn Cline Procedure Date: 10/03/2020 9:37 AM MRN: [id number] Endoscopist: [name] , MD Age: 45 Referring MD:  Date of Birth: 06/07/1975 Gender: Male Account #: [id number] Procedure:                Upper GI endoscopy Indications:              Dysphagia, Stricture of the esophagus, For therapy                            of esophageal stricture Medicines:                Propofol per Anesthesia, Monitored Anesthesia Care Procedure:                Pre-Anesthesia Assessment:                           - Prior to the procedure, a History and Physical                            was performed, and patient medications and                            allergies were reviewed. The patient's tolerance of                            previous anesthesia was also reviewed. The risks                            and benefits of the procedure and the sedation                            options and risks were discussed with the patient.                            All questions were answered, and informed consent                            was obtained. Prior Anticoagulants: The patient has                            taken no previous anticoagulant or antiplatelet                            agents. ASA Grade Assessment: III - A patient with                            severe systemic disease. After reviewing the risks                            and benefits, the patient was deemed in                            satisfactory condition to undergo the procedure.  After obtaining informed consent, the endoscope was                            passed under direct vision. Throughout the                            procedure, the patient's blood pressure, pulse, and                            oxygen saturations were monitored continuously. The                            GIF [id number] was introduced through the                             mouth, and advanced to the antrum of the stomach.                            The upper GI endoscopy was accomplished without                            difficulty. The patient tolerated the procedure                            well. Scope In: Scope Out: Findings:                 One benign-appearing, intrinsic moderate                            (circumferential scarring or stenosis; an endoscope                            may pass) stenosis was found in the lower third of                            the esophagus. This stenosis measured 1.2 cm (inner                            diameter) x 5 cm (in length). The stenosis was                            traversed. A TTS dilator was passed through the                            scope. Dilation with a 13.5-14.5-15.5 mm balloon                            dilator was performed to 15.5 mm. The dilation site                            was examined and showed moderate improvement in  luminal narrowing. Estimated blood loss was minimal.                           The cardia and gastric fundus were normal on                            retroflexion.                           The exam was otherwise without abnormality. Complications:            No immediate complications. Estimated Blood Loss:     Estimated blood loss was minimal. Impression:               - Benign-appearing esophageal stenosis. Dilated. It                            is inflamed as before but improved and scope was                            able to pass prior to dilation this time (after 2                            previous dilations). maximum dilation size 15.5 mm                           - The examination was otherwise normal.                           - No specimens collected. Recommendation:           - Patient has a contact number available for                            emergencies. The signs and symptoms of potential                             delayed complications were discussed with the                            patient. Return to normal activities tomorrow.                            Written discharge instructions were provided to the                            patient.                           - Clear liquids x 1 hour then soft foods rest of                            day. Start prior diet tomorrow.                           - Continue present medications. Bid  PPI and QID                            carafate                           - Repeat upper endoscopy Oct 31, 2020 for                            retreatment. [name], MD 10/03/2020 10:11:45 AM This report has been signed electronically.

## 2020-10-03 NOTE — Progress Notes (Signed)
Pt's states no medical or surgical changes since previsit or office visit. VS assessed by C.W 

## 2020-10-03 NOTE — Patient Instructions (Addendum)
I dilated the esophagus again today.  It is getting better.  I want to repeat this procedure on October 5 - we will make an appointment.  Take liquids only until 11 AM today then soft foods. See Post-dilation diet handout provided.  Tomorrow you can try to eat any type food you want but would avoid large pieces of meat, bread or large raw vegetables or fruits. Keeping things soft will make swallowing better.  I appreciate the opportunity to care for you.  Continue present medications.   [name], MD, FACG  USTED TUVO UN PROCEDIMIENTO ENDOSCPICO HOY EN EL [facility]:   Lea el informe del procedimiento que se le entreg para cualquier pregunta especfica sobre lo que se encontr durante su examen.  Si el informe del examen no responde a sus preguntas, por favor llame a su gastroenterlogo para aclararlo.  Si usted solicit que no se le den [employer] de lo que se Estate manager/land agent en su procedimiento al [employer] va a cuidar, entonces el informe del procedimiento se ha incluido en un sobre sellado para que usted lo revise despus cuando le sea ms conveniente.   LO QUE PUEDE ESPERAR: Algunas sensaciones de hinchazn en el abdomen.  Puede tener ms gases de lo normal.  El caminar puede ayudarle a eliminar el aire que se le puso en el tracto gastrointestinal durante el procedimiento y reducir la hinchazn.  Si le hicieron una endoscopia inferior (como una colonoscopia o una sigmoidoscopia flexible), podra notar manchas de sangre en las heces fecales o en el papel higinico.  Si se someti a una preparacin intestinal para su procedimiento, es posible que no tenga una evacuacin intestinal normal durante [employer].   Tenga en cuenta:  Es posible que note un poco de irritacin y congestin en la nariz o algn drenaje.  Esto es debido al oxgeno [employer] durante su procedimiento.  No hay que preocuparse y esto debe desaparecer ms o Scientist, research (medical).    Despus de la endoscopia  superior (EGD)  Vmitos de Biochemist, clinical o material como caf molido   Dolor en el pecho o dolor debajo de los omplatos que antes no tena   Dolor o dificultad persistente para tragar  Falta de aire que antes no tena   Fiebre de 100F o ms  Heces fecales negras y pegajosas   Para asuntos urgentes o de Freight forwarder, puede comunicarse con un gastroenterlogo a cualquier hora llamando al [phone].  DIETA:  Post-dilation diet: Clear liquids for 1 hour (until 11am). Then a Soft diet (see handout) for the rest of today. Tome muchos lquidos, Teacher, adult education las bebidas alcohlicas durante 24 horas.    ACTIVIDAD:  Debe planear tomarse las cosas con calma por el resto del da y no debe CONDUCIR ni usar maquinaria pesada Programmer, applications (debido a los medicamentos de sedacin utilizados durante el examen).     SEGUIMIENTO: Nuestro personal llamar al nmero que aparece en su historial al siguiente da hbil de su procedimiento para ver cmo se siente y para responder cualquier pregunta o inquietud que pueda tener con respecto a la informacin que se le dio despus del procedimiento. Si no podemos contactarle, le dejaremos un mensaje.  Sin embargo, si se siente bien y no tiene Paediatric nurse, no es necesario que nos devuelva la llamada.  Asumiremos que ha regresado a sus actividades diarias normales sin incidentes. Si se le tomaron algunas biopsias, le contactaremos por telfono o por carta en  las prximas 3 semanas.  Si no ha sabido [employer] biopsias en el transcurso de 3 semanas, por favor llmenos al [phone].   FIRMAS/CONFIDENCIALIDAD: Usted y/o el acompaante que le cuide han firmado documentos que se ingresarn en su historial mdico Emergency planning/management officer.  Estas firmas atestiguan el hecho de que la informacin anterior    YOU HAD AN ENDOSCOPIC PROCEDURE TODAY AT [facility]:   Refer to the procedure report that was given to you for any specific questions about what was found during  the examination.  If the procedure report does not answer your questions, please call your gastroenterologist to clarify.  If you requested that your care partner not be given the details of your procedure findings, then the procedure report has been included in a sealed envelope for you to review at your convenience later.  YOU SHOULD EXPECT: Some feelings of bloating in the abdomen. Passage of more gas than usual.  Walking can help get rid of the air that was put into your GI tract during the procedure and reduce the bloating. If you had a lower endoscopy (such as a colonoscopy or flexible sigmoidoscopy) you may notice spotting of blood in your stool or on the toilet paper. If you underwent a bowel prep for your procedure, you may not have a normal bowel movement for a few days.  Please Note:  You might notice some irritation and congestion in your nose or some drainage.  This is from the oxygen used during your procedure.  There is no need for concern and it should clear up in a day or so.  SYMPTOMS TO REPORT IMMEDIATELY:  Following upper endoscopy (EGD)  Vomiting of blood or coffee ground material  New chest pain or pain under the shoulder blades  Painful or persistently difficult swallowing  New shortness of breath  Fever of 100F or higher  Black, tarry-looking stools  For urgent or emergent issues, a gastroenterologist can be reached at any hour by calling [phone]. Do not use MyChart messaging for urgent concerns.    DIET:  Post-dilation diet: Clear liquids for 1 hour (until 11am). Then a Soft diet (see handout) for the rest of today. Drink plenty of fluids but you should avoid alcoholic beverages for 24 hours.  ACTIVITY:  You should plan to take it easy for the rest of today and you should NOT DRIVE or use heavy machinery until tomorrow (because of the sedation medicines used during the test).    FOLLOW UP: Our staff will call the number listed on your records 48-72 hours  following your procedure to check on you and address any questions or concerns that you may have regarding the information given to you following your procedure. If we do not reach you, we will leave a message.  We will attempt to reach you two times.  During this call, we will ask if you have developed any symptoms of COVID 19. If you develop any symptoms (ie: fever, flu-like symptoms, shortness of breath, cough etc.) before then, please call [phone].  If you test positive for Covid 19 in the 2 weeks post procedure, please call and report this information to [name].    If any biopsies were taken you will be contacted by phone or by letter within the next 1-3 weeks.  Please call us at [phone] if you have not heard about the biopsies in 3 weeks.    SIGNATURES/CONFIDENTIALITY: You and/or your care partner have signed paperwork  which will be entered into your electronic medical record.  These signatures attest to the fact that that the information above on your After Visit Summary has been reviewed and is understood.  Full responsibility of the confidentiality of this discharge information lies with you and/or your care-partner.

## 2020-10-03 NOTE — Progress Notes (Signed)
Repeat EGD scheduled for 10/31/20 at 930. Instructions provided in spanish and reviewed with pt and his niece who helps with interpreting. They verbalized understanding of instructions.

## 2020-10-03 NOTE — Progress Notes (Signed)
[facility] History and Physical   Primary Care Physician:  [name], MD   Reason for Procedure:  Esophageal stricture dilation  Plan:    Esophagoscopy?EGD and esophageal dilation     HPI: Jimmy Hawkins is a 45 y.o. male w/ hx esophageal cancer and a radiation stricture here for repeat dilation to treat dysphagia.   Past Medical History:  Diagnosis Date   Blood transfusion without reported diagnosis    Cancer ([place])    Hematemesis with nausea 08/10/2019    Past Surgical History:  Procedure Laterality Date   BIOPSY  08/11/2019   Procedure: BIOPSY;  Surgeon: [name], MD;  Location[facility];  Service: Endoscopy;;   BIOPSY  11/28/2019   Procedure: BIOPSY;  Surgeon: [name], MD;  Location: [employer];  Service: Gastroenterology;;   ESOPHAGOGASTRODUODENOSCOPY  08/11/2019   ESOPHAGOGASTRODUODENOSCOPY (EGD) WITH PROPOFOL N/A 08/11/2019   Procedure: ESOPHAGOGASTRODUODENOSCOPY (EGD) WITH PROPOFOL;  Surgeon: [name], MD;  Location: [place];  Service: Endoscopy;  Laterality: N/A;   ESOPHAGOGASTRODUODENOSCOPY (EGD) WITH PROPOFOL N/A 11/28/2019   Procedure: ESOPHAGOGASTRODUODENOSCOPY (EGD) WITH PROPOFOL;  Surgeon: Rush Landmark Telford Nab., MD;  Location: [place];  Service: Gastroenterology;  Laterality: N/A;   EUS N/A 11/28/2019   Procedure: UPPER ENDOSCOPIC ULTRASOUND (EUS) RADIAL;  Surgeon: [name], MD;  Location: [place];  Service: Gastroenterology;  Laterality: N/A;    Prior to Admission medications   Medication Sig Start Date End Date Taking? Authorizing Provider  diphenhydrAMINE-APAP, sleep, (TYLENOL PM EXTRA STRENGTH) 50-1000 MG/30ML LIQD Take 15 mLs by mouth at bedtime as needed (sleep).   Yes [provider]  famotidine (PEPCID) 20 MG tablet TOME 1 PASTILLA POR VIA ORAL DOS VECES AL DIA 12/20/19 12/19/20 Yes [name], MD  Ferrous Sulfate (IRON) 325 (65 Fe) MG TABS Take 1 tablet (325  mg total) by mouth 2 (two) times daily. 07/20/20 10/03/20 Yes [name], MD  ferrous sulfate 325 (65 FE) MG tablet TAKE 1 TABLET BY MOUTH 2 TIMES DAILY. 05/18/20 05/18/21 Yes [name], MD  gabapentin (NEURONTIN) 300 MG capsule Take 1 capsule (300 mg total) by mouth 3 (three) times daily. 07/06/20  Yes [name], MD  HYDROcodone-acetaminophen (HYCET) 7.5-325 mg/15 ml solution Take 10 mLs by mouth every 8 (eight) hours as needed for moderate pain. 09/21/20 03/20/21 Yes [name], MD  morphine (MS CONTIN) 30 MG 12 hr tablet Take 1 tablet (30 mg total) by mouth every 8 (eight) hours. 08/14/20 02/10/21 Yes [name], MD  pantoprazole (PROTONIX) 40 MG tablet Take 1 tablet (40 mg total) by mouth 2 (two) times daily before a meal. 09/06/20  Yes Lemmon, Lavone Nian, PA  prochlorperazine (COMPAZINE) 10 MG tablet Take 1 tablet (10 mg total) by mouth every 6 (six) hours as needed for nausea or vomiting. 09/05/20 09/05/21 Yes [name], MD  sucralfate (CARAFATE) 1 g tablet Take 1 tablet by mouth 4 times daily. (Dissolve in 15 mls of water before taking) 09/06/20  Yes Lemmon, Lavone Nian, PA  mirtazapine (REMERON) 7.5 MG tablet TOME 1 PASTILLA POR VIA ORAL A LA HORA DE ACOSTARSE Patient not taking: Reported on 10/03/2020 09/21/20 09/21/21  [name], MD  omeprazole (PRILOSEC) 40 MG capsule Take 1 capsule (40 mg total) by mouth daily. 09/12/19 11/28/19  [name], MD    Current Outpatient Medications  Medication Sig Dispense Refill   diphenhydrAMINE-APAP, sleep, (TYLENOL PM EXTRA STRENGTH) 50-1000 MG/30ML LIQD Take 15 mLs by mouth at bedtime as needed (sleep).  famotidine (PEPCID) 20 MG tablet TOME 1 PASTILLA POR VIA ORAL DOS VECES AL DIA 60 tablet 3   Ferrous Sulfate (IRON) 325 (65 Fe) MG TABS Take 1 tablet (325 mg total) by mouth 2 (two) times daily. 60 tablet 3   ferrous sulfate 325 (65 FE) MG tablet TAKE 1 TABLET BY MOUTH 2 TIMES DAILY. 60 tablet 3   gabapentin (NEURONTIN) 300 MG capsule Take 1 capsule (300 mg total)  by mouth 3 (three) times daily. 90 capsule 0   HYDROcodone-acetaminophen (HYCET) 7.5-325 mg/15 ml solution Take 10 mLs by mouth every 8 (eight) hours as needed for moderate pain. 473 mL 0   morphine (MS CONTIN) 30 MG 12 hr tablet Take 1 tablet (30 mg total) by mouth every 8 (eight) hours. 90 tablet 0   pantoprazole (PROTONIX) 40 MG tablet Take 1 tablet (40 mg total) by mouth 2 (two) times daily before a meal. 60 tablet 3   prochlorperazine (COMPAZINE) 10 MG tablet Take 1 tablet (10 mg total) by mouth every 6 (six) hours as needed for nausea or vomiting. 60 tablet 2   sucralfate (CARAFATE) 1 g tablet Take 1 tablet by mouth 4 times daily. (Dissolve in 15 mls of water before taking) 120 tablet 2   mirtazapine (REMERON) 7.5 MG tablet TOME 1 PASTILLA POR VIA ORAL A LA HORA DE ACOSTARSE (Patient not taking: Reported on 10/03/2020) 30 tablet 1   Current Facility-Administered Medications  Medication Dose Route Frequency Provider Last Rate Last Admin   0.9 %  sodium chloride infusion  500 mL Intravenous Once [name], MD       Facility-Administered Medications Ordered in Other Visits  Medication Dose Route Frequency Provider Last Rate Last Admin   diphenhydrAMINE (BENADRYL) 50 MG/ML injection            diphenhydrAMINE (BENADRYL) 50 MG/ML injection            famotidine (PEPCID) 20 mg IVPB            famotidine (PEPCID) 20 mg IVPB            palonosetron (ALOXI) 0.25 MG/5ML injection            palonosetron (ALOXI) 0.25 MG/5ML injection             Allergies as of 10/03/2020   (No Known Allergies)    Family History  Problem Relation Age of Onset   Esophageal cancer Mother    Stomach cancer Mother    Healthy Father    Colon cancer Neg Hx    Rectal cancer Neg Hx     Social History   Socioeconomic History   Marital status: Single    Spouse name: Not on file   Number of children: Not on file   Years of education: Not on file   Highest education level: Not on file  Occupational  History   Not on file  Tobacco Use   Smoking status: Never   Smokeless tobacco: Never  Vaping Use   Vaping Use: Never used  Substance and Sexual Activity   Alcohol use: Not Currently    Alcohol/week: 6.0 standard drinks    Types: 6 Cans of beer per week   Drug use: Never   Sexual activity: Not on file  Other Topics Concern   Not on file  Social History Narrative   Not on file   Social Determinants of Health   Financial Resource Strain: Not on file  Food Insecurity: Not on  file  Transportation Needs: Not on file  Physical Activity: Not on file  Stress: Not on file  Social Connections: Not on file  Intimate Partner Violence: Not on file    Review of Systems:  All other review of systems negative except as mentioned in the HPI.  Physical Exam: Vital signs BP (!) 106/58   Pulse (!) 102   Temp (!) 97.1 F (36.2 C) (Skin)   Ht '5\' 7"'$  (1.702 m)   Wt 153 lb (69.4 kg)   SpO2 99%   BMI 23.96 kg/m   General:   Alert,  Well-developed, well-nourished, pleasant and cooperative in NAD Lungs:  Clear throughout to auscultation.   Heart:  Regular rate and rhythm; no murmurs, clicks, rubs,  or gallops. Abdomen:  Soft, nontender and nondistended. Normal bowel sounds.   Neuro/Psych:  Alert and cooperative. Normal mood and affect. A and O x 3   '@Reniya Mcclees'$  Simonne Maffucci, MD, [facility] [phone] (pager) 10/03/2020 9:38 AM@

## 2020-10-03 NOTE — Progress Notes (Signed)
Report to PACU, RN, vss, BBS= Clear.  

## 2020-10-03 NOTE — Progress Notes (Signed)
Called to room to assist during endoscopic procedure.  Patient ID and intended procedure confirmed with present staff. Received instructions for my participation in the procedure from the performing physician.  

## 2020-10-05 ENCOUNTER — Telehealth: Payer: Self-pay

## 2020-10-05 ENCOUNTER — Telehealth: Payer: Self-pay | Admitting: *Deleted

## 2020-10-05 NOTE — Telephone Encounter (Signed)
  Follow up Call-  Call back number 10/03/2020 09/19/2020 09/14/2020  Post procedure Call Back phone  # [phone]  Permission to leave phone message - Yes Yes  Some recent data might be hidden     Patient questions:  Do you have a fever, pain , or abdominal swelling? No. Pain Score  0 *  Have you tolerated food without any problems? Yes.    Have you been able to return to your normal activities? Yes.    Do you have any questions about your discharge instructions: Diet   No. Medications  No. Follow up visit  No.  Do you have questions or concerns about your Care? No.  Actions: * If pain score is 4 or above: No action needed, pain <4.

## 2020-10-05 NOTE — Telephone Encounter (Signed)
Attempted to reach patient for post-procedure follow-up call. No answer. Left voicemail.

## 2020-10-12 ENCOUNTER — Encounter: Payer: Self-pay | Admitting: Physician Assistant

## 2020-10-12 ENCOUNTER — Encounter: Payer: Self-pay | Admitting: Hematology

## 2020-10-12 ENCOUNTER — Other Ambulatory Visit (HOSPITAL_COMMUNITY): Payer: Self-pay

## 2020-10-12 ENCOUNTER — Inpatient Hospital Stay: Payer: Self-pay

## 2020-10-12 ENCOUNTER — Other Ambulatory Visit: Payer: Self-pay

## 2020-10-12 ENCOUNTER — Inpatient Hospital Stay (HOSPITAL_BASED_OUTPATIENT_CLINIC_OR_DEPARTMENT_OTHER): Payer: Self-pay | Admitting: Hematology

## 2020-10-12 VITALS — BP 114/83 | HR 101 | Temp 98.2°F | Resp 18 | Ht 67.0 in | Wt 156.8 lb

## 2020-10-12 VITALS — HR 98

## 2020-10-12 DIAGNOSIS — C154 Malignant neoplasm of middle third of esophagus: Secondary | ICD-10-CM

## 2020-10-12 DIAGNOSIS — C7951 Secondary malignant neoplasm of bone: Secondary | ICD-10-CM

## 2020-10-12 DIAGNOSIS — K920 Hematemesis: Secondary | ICD-10-CM

## 2020-10-12 LAB — CBC WITH DIFFERENTIAL/PLATELET
Abs Immature Granulocytes: 0.1 10*3/uL — ABNORMAL HIGH (ref 0.00–0.07)
Basophils Absolute: 0 10*3/uL (ref 0.0–0.1)
Basophils Relative: 1 %
Eosinophils Absolute: 0 10*3/uL (ref 0.0–0.5)
Eosinophils Relative: 2 %
HCT: 35.6 % — ABNORMAL LOW (ref 39.0–52.0)
Hemoglobin: 12.1 g/dL — ABNORMAL LOW (ref 13.0–17.0)
Immature Granulocytes: 4 %
Lymphocytes Relative: 16 %
Lymphs Abs: 0.4 10*3/uL — ABNORMAL LOW (ref 0.7–4.0)
MCH: 33.2 pg (ref 26.0–34.0)
MCHC: 34 g/dL (ref 30.0–36.0)
MCV: 97.8 fL (ref 80.0–100.0)
Monocytes Absolute: 0.5 10*3/uL (ref 0.1–1.0)
Monocytes Relative: 18 %
Neutro Abs: 1.5 10*3/uL — ABNORMAL LOW (ref 1.7–7.7)
Neutrophils Relative %: 59 %
Platelets: 160 10*3/uL (ref 150–400)
RBC: 3.64 MIL/uL — ABNORMAL LOW (ref 4.22–5.81)
RDW: 15.2 % (ref 11.5–15.5)
WBC: 2.6 10*3/uL — ABNORMAL LOW (ref 4.0–10.5)
nRBC: 0 % (ref 0.0–0.2)

## 2020-10-12 LAB — COMPREHENSIVE METABOLIC PANEL
ALT: 8 U/L (ref 0–44)
AST: 18 U/L (ref 15–41)
Albumin: 3.7 g/dL (ref 3.5–5.0)
Alkaline Phosphatase: 78 U/L (ref 38–126)
Anion gap: 10 (ref 5–15)
BUN: 5 mg/dL — ABNORMAL LOW (ref 6–20)
CO2: 24 mmol/L (ref 22–32)
Calcium: 9.4 mg/dL (ref 8.9–10.3)
Chloride: 104 mmol/L (ref 98–111)
Creatinine, Ser: 0.62 mg/dL (ref 0.61–1.24)
GFR, Estimated: >60 mL/min (ref >60)
Glucose, Bld: 110 mg/dL — ABNORMAL HIGH (ref 70–99)
Potassium: 3.7 mmol/L (ref 3.5–5.1)
Sodium: 138 mmol/L (ref 135–145)
Total Bilirubin: 0.7 mg/dL (ref 0.3–1.2)
Total Protein: 7.4 g/dL (ref 6.5–8.1)

## 2020-10-12 LAB — TSH: TSH: 1.863 u[IU]/mL (ref 0.320–4.118)

## 2020-10-12 MED ORDER — FAMOTIDINE 20 MG IN NS 100 ML IVPB
20.0000 mg | Freq: Once | INTRAVENOUS | Status: AC
  Administered 2020-10-12: 20 mg via INTRAVENOUS

## 2020-10-12 MED ORDER — SODIUM CHLORIDE 0.9 % IV SOLN: Freq: Once | INTRAVENOUS | Status: AC

## 2020-10-12 MED ORDER — SODIUM CHLORIDE 0.9 % IV SOLN
222.9000 mg | Freq: Once | INTRAVENOUS | Status: AC
  Administered 2020-10-12: 220 mg via INTRAVENOUS
  Filled 2020-10-12: qty 22

## 2020-10-12 MED ORDER — SODIUM CHLORIDE 0.9 % IV SOLN
200.0000 mg | Freq: Once | INTRAVENOUS | Status: AC
  Administered 2020-10-12: 200 mg via INTRAVENOUS
  Filled 2020-10-12: qty 8

## 2020-10-12 MED ORDER — MORPHINE SULFATE ER 30 MG PO TBCR
30.0000 mg | EXTENDED_RELEASE_TABLET | Freq: Two times a day (BID) | ORAL | 0 refills | Status: DC
  Filled 2020-10-12: qty 60, 30d supply, fill #0

## 2020-10-12 MED ORDER — PROCHLORPERAZINE MALEATE 10 MG PO TABS
10.0000 mg | ORAL_TABLET | Freq: Four times a day (QID) | ORAL | 2 refills | Status: DC | PRN
  Filled 2020-10-12: qty 60, 15d supply, fill #0

## 2020-10-12 MED ORDER — HYDROCODONE-ACETAMINOPHEN 7.5-325 MG/15ML PO SOLN
10.0000 mL | Freq: Three times a day (TID) | ORAL | 0 refills | Status: DC | PRN
  Filled 2020-10-12: qty 473, 16d supply, fill #0

## 2020-10-12 MED ORDER — PALONOSETRON HCL INJECTION 0.25 MG/5ML
INTRAVENOUS | Status: AC
  Filled 2020-10-12: qty 5

## 2020-10-12 MED ORDER — PANTOPRAZOLE SODIUM 40 MG PO TBEC
40.0000 mg | DELAYED_RELEASE_TABLET | Freq: Two times a day (BID) | ORAL | 3 refills | Status: DC
  Filled 2020-10-12: qty 60, 30d supply, fill #0

## 2020-10-12 MED ORDER — SODIUM CHLORIDE 0.9 % IV SOLN
60.0000 mg/m2 | Freq: Once | INTRAVENOUS | Status: AC
  Administered 2020-10-12: 114 mg via INTRAVENOUS
  Filled 2020-10-12: qty 19

## 2020-10-12 MED ORDER — DIPHENHYDRAMINE HCL 50 MG/ML IJ SOLN
50.0000 mg | Freq: Once | INTRAMUSCULAR | Status: AC
  Administered 2020-10-12: 50 mg via INTRAVENOUS
  Filled 2020-10-12: qty 1

## 2020-10-12 MED ORDER — PALONOSETRON HCL INJECTION 0.25 MG/5ML
0.2500 mg | Freq: Once | INTRAVENOUS | Status: AC
  Administered 2020-10-12: 0.25 mg via INTRAVENOUS

## 2020-10-12 MED ORDER — FAMOTIDINE 20 MG IN NS 100 ML IVPB
INTRAVENOUS | Status: AC
  Filled 2020-10-12: qty 100

## 2020-10-12 NOTE — Progress Notes (Signed)
Jimmy Hawkins   [phone] Fax:(336) [phone]   Clinic Follow up Note   Patient Care Team: [name], MD as PCP - General (Hematology) [name], MD as Consulting Physician (Oncology)  Date of Service:  10/12/2020  CHIEF COMPLAINT: f/u of esophageal cancer  CURRENT THERAPY:  First-line Keytruda q3weeks starting 04/27/20, added weekly carbo AUC 1.5 and Taxol 49m/m2 from cycle 4 (06/29/20) on day 1, 8 every 21 days   ASSESSMENT & PLAN:  Jimmy Purteeis a 45y.o. male with   1. 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 08/11/19 CT CAP found him to have mid esophageal mass and enlarged AP window node, no distant metastasis. -He completed 6 weeks of concurrent chemoRT with weekly Carboplatin and Taxol 08/29/19-10/06/19 -Repeat PET scan October 2021 showed hypermetabolic wall thickening of the distal half of the thoracic esophageus, no notable distant metastasis.  -He received CAPOX q3weeks with Xeloda 15049mBID 2 weeks on/1 week off 12/20/19-02/2020. -He completed palliative Radiation to sacrum with Dr MoLisbeth Renshaw/28/22-3/15/22.  -He has started First-line single agent Keytruda 04/27/20. Due to his slow improvement and squamous cell histology, we added chemo with carboplatin and Taxol 2 weeks on/1 week off from C4 on 06/29/20. He receives zarxio as needed for neutropenia. -His PET from 07/17/20 showed Left sacral metastasis is slightly enlarged and slightly increased in peripheral hypermetabolism. No additional evidence of metastatic disease. Will continue current regimen. -Labs reviewed and adequate to proceed with Keytruda and chemo today. We will schedule zarxio for tomorrow, in addition to his already scheduled injection 10/15/20 -f/u 3 weeks with restaging scan prior to that visit.   2. Sacral/back pain, LE weakness,  secondary to bone metastasis -He was seen to have bone mets on 04/11/20 PET scan. -He completed palliative Radiation to sacrum with Dr MoLisbeth Renshaw/28/22-3/15/22. He was given short course dexa 3m56m -PET 07/17/20 showed enlargement and slight increase in peripheral hypermetabolism to left sacral metastasis. -He is currently taking morphine SR BID and hydrocodone 7.5-10cc BID. -He rates the pain at 5/10 today.   3. H/o Heavy alcohol user -He quit drinking since cancer diagnosis, but started again. Due to weakness, he is not able to drink as much since 04/13/20 and stopped overall 06/29/20 -today he reports he is back to drinking 5 beers a day (10/12/20). I advised him to abstain for the day of and a few following chemo.   4. Social and Financial Support  -Patient does not have insurance and is not currently working. He has already applied for Medicaid. -He has met with financial advocate Shauna and SW and he has grant assistance with medications. -Will see if he can get drug replacement for Keytruda. -Patient, family and Interpretor notes difficulty with communication. They will set up Mychart for more direct line.    5. Dysphagia and weight loss -He continues to have difficulty swallowing, and as a result has lost weight. -he is undergoing esophageal dilation under Dr. GesCarlean Purlhe reports increased appetite on mirtazapine. -f/u with dietician      PLAN: -proceed with C10 Keytruda, carbo and taxol today, chemo day 8 next week  -add zarxio on day 2 and 4 of each treatment -labs, flush, f/u in 3 weeks before next cycle chemo and Keytruda, with restaging PET scan prior to next visit  -I refilled MS contin and hydrocodone  No problem-specific Assessment & Plan notes found for this encounter.   SUMMARY OF ONCOLOGIC HISTORY: Oncology History Overview Note  Cancer Staging Malignant neoplasm of middle third of esophagus ([place]) Staging form: Esophagus - Squamous Cell Carcinoma, AJCC 8th  Edition - Clinical: Stage Unknown (cTX, cN1, cM0) - Signed by [name], MD on 08/18/2019    Malignant neoplasm of middle third of esophagus [facility])   Initial Diagnosis   Malignant neoplasm of middle third of esophagus ([place])   08/11/2019 Imaging   CT CAP W contrast 08/11/19 IMPRESSION: 1. Mid to lower esophageal mass along an 8 cm segment, large endoluminal component, strongly favoring esophageal malignancy. Borderline enlarged AP window lymph node. No findings of distant metastatic disease. 2. Trace bilateral pleural effusions. 3. Diffuse hepatic steatosis. 4. Cholelithiasis. 5. Fatty spermatic cords likely due to small bilateral indirect inguinal hernias.   08/11/2019 Procedure   EGD by Dr Carlean Purl  IMPRESSION - Partially obstructing, likely malignant esophageal tumor was found in the upper third of the esophagus and in the middle third of the esophagus. Biopsied. Very large and long - difficult but able to advance scope by this lesion - await CT for better length estimate - Normal stomach. - Normal examined duodenum.   08/11/2019 Initial Biopsy   FINAL MICROSCOPIC DIAGNOSIS:   A. ESOPHAGUS, UPPER, BIOPSY:  - Squamous cell carcinoma.    08/11/2019 Genetic Testing   PD-L1 Expression 90%   08/18/2019 Cancer Staging   Staging form: Esophagus - Squamous Cell Carcinoma, AJCC 8th Edition - Clinical: Stage Unknown (cTX, cN1, cM0) - Signed by [name], MD on 08/18/2019   08/29/2019 - 10/04/2019 Chemotherapy   Concurrent chemoradiation with weekly CT for 6 weeks starting 08/29/19-10/04/19    08/29/2019 - 10/06/2019 Radiation Therapy   Concurrent chemoradiation with Dr Lisbeth Renshaw starting 08/29/19-10/06/19   11/11/2019 PET scan   IMPRESSION: 1. Again seen is a long segment of FDG avid wall thickening of the distal half of the thoracic esophagus compatible with known esophageal neoplasm. 2. No signs of FDG avid nodal or distant metastatic disease.     11/28/2019 Procedure   EUS by Dr  Rush Landmark IMPRESSION EGD Impression: - No gross lesions in esophagus proximally. - Previously noted esophageal cancer is not present. However, in its place and extending distal to this is likely LA Grade D chemotherapy/radiation esophagitis with bleeding. Biopsied after the EUS was complete to evaluate for persistent disease or not. - Z-line regular, 37 cm from the incisors. - 3 cm hiatal hernia. - Erythematous mucosa in the stomach. No other gross lesions in the stomach. Biopsied. - No gross lesions in the duodenal bulb, in the first portion of the duodenum and in the second portion of the duodenum. EUS Impression: - Wall thickening was seen in the thoracic esophagus into the gastroesophageal junction. The thickening appeared to primarily be within the luminal interface/superficial mucosa (Layer 1) and deep mucosa (Layer 2). The previously noted esophageal cancer has been replaced by what appears to be radiation/chemotherapy induced changes. With the limitations of having to use the mini-probe EUS, I could not visualize persistent significant disease other than the entire distal area of the esophagus having likely changes noted as above. - No malignant-appearing lymph nodes were visualized in the middle paraesophageal mediastinum (level 58M), lower paraesophageal mediastinum (level 8L), diaphragmatic region (level 15) and paracardial region (level 16).      FINAL MICROSCOPIC DIAGNOSIS:   A. STOMACH, BIOPSY:  - Mildly active chronic gastritis.  - Warthin-Starry negative for  Helicobacter pylori.  - No intestinal metaplasia, dysplasia or carcinoma.   B. ESOPHAGUS, BIOPSY:  - Inflamed granulation tissue and exudate consistent with ulcer.  - No malignancy identified.    12/20/2019 - 02/2020 Chemotherapy   CAPOX q3weeks with Xeloda 1580m BID 2 weeks on/1 week off starting 12/20/19-02/2020   03/21/2020 Imaging   [facility]--Circumferential thickening of the lower third of the  esophagus which corresponds with known esophageal malignancy. No evidence of metastatic disease within the chest.  --For findings below the diaphragm, please see concurrent separately reported CT of the abdomen and pelvis.  --Expansile lytic mass with associated soft tissue component centered in the left sacral ala and abutting the left S1 and S2 nerve roots, left internal iliac vasculature, and left pyriformis muscle with no other metastatic disease within the abdomen or pelvis.   --Diffuse thickening of the distal esophagus compatible with known esophageal malignancy.   --Cholelithiasis with nonspecific gallbladder distention. Clinical correlation is recommended. If there is clinical concern for cholecystitis, further evaluation with ultrasound can be obtained.   03/21/2020 Imaging   CT Lumbar Spine  Expansile lytic lesion centered in the left sacral ala with no other evidence of metastatic disease in the lumbar spine.   03/26/2020 - 04/10/2020 Radiation Therapy   Palliative Radiation to Sacrum with Dr MLisbeth Renshaw2/28/22-3/15/22.    04/11/2020 Progression   PET  IMPRESSION: 1. New hypermetabolic expansile 6.1 cm upper left sacral lytic bone metastasis, centrally necrotic. 2. New hypermetabolic left level 1b and left level 2 neck lymph nodes, indeterminate for reactive versus metastatic nodes. 3. Persistent hypermetabolism associated with a short segment of mid to lower thoracic esophagus with associated circumferential esophageal wall thickening, similar in metabolism and decreased in wall thickness since 11/11/2019 PET-CT, nonspecific, favor post treatment change, with residual neoplasm not excluded. 4. No additional sites of hypermetabolic metastatic disease. 5.  Chronic findings include: Cholelithiasis.   04/27/2020 -  Chemotherapy   First-line with immunotherapy Keytruda q3weeks starting 04/27/20       06/29/2020 -  Chemotherapy   Weekly carbo AUC 1.5 and Taxol 615mm2 on day 1, 8 every  21 days added to first-line Keytruda from C4 on 06/29/20.     07/17/2020 PET scan   IMPRESSION: 1. Left sacral metastasis is slightly enlarged and slightly increased in peripheral hypermetabolism. No additional evidence of metastatic disease. 2. Similar mild hypermetabolism associated with the mid esophagus. No residual hypermetabolism within left neck lymph nodes. 3. Small pericardial effusion. 4. Trace bilateral pleural effusions. 5. Cholelithiasis.     09/14/2020 Procedure   Upper Endoscopy  Impression: - Esophagitis. Biopsied. - Esophageal stenosis. Dilated. Biopsied. this looks most like a mid-distal inflammatory stricture (? post XRT +/- reflux, Candida?) scope passed through but styill felt gerip on scope so did not advance deeper into stomach to avoid trauma. - Normal cardia.    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 - NO EVIDENCE OF DYSPLASIA OR MALIGNANCY. - SEE MICROSCOPIC DESCRIPTION. Microscopic Comment While histologic changes of radiation effects are not apparent in the inflamed granulation tissue, the presence of ulceration can be indicative of radiation effects.      INTERVAL HISTORY:  Jimmy Hawkins here for a follow up of esophageal cancer. He was last seen by me on 09/21/20. He presents to the clinic accompanied by his niece and our interpreter, JuAlmyra FreeHe reports his pain is stable, no new pain. He also reports  neuropathy in his fingers but it does not impact his function, as well as in his toes mostly in the left. He also reports dry mouth/throat. He reports he is drinking 5 beers a day. He reports he is eating well on the mirtazapine.   All other systems were reviewed with the patient and are negative.  MEDICAL HISTORY:  Past Medical History:  Diagnosis Date   Blood transfusion without reported diagnosis    Hematemesis with nausea 08/10/2019   Squamous cell  carcinoma, esophagus ([place]) 04/2019   metastatic    SURGICAL HISTORY: Past Surgical History:  Procedure Laterality Date   BIOPSY  08/11/2019   Procedure: BIOPSY;  Surgeon: [name], MD;  Location: [facility] ENDOSCOPY;  Service: Endoscopy;;   BIOPSY  11/28/2019   Procedure: BIOPSY;  Surgeon: [name], MD;  Location[facility] END[facility]COPY;  Service: Gastroenterology;;   ESOPHAGOGASTRODUODENOSCOPY  08/11/2019   ESOPHAGOGASTRODUODENOSCOPY (EGD) WITH PROPOFOL N/A 08/11/2019   Procedure: ESOPHAGOGASTRODUODENOSCOPY (EGD) WITH PROPOFOL;  Surgeon: [name], MD;  Location: [place];  Service: Endoscopy;  Laterality: N/A;   ESOPHAGOGASTRODUODENOSCOPY (EGD) WITH PROPOFOL N/A 11/28/2019   Procedure: ESOPHAGOGASTRODUODENOSCOPY (EGD) WITH PROPOFOL;  Surgeon: Rush Landmark Telford Nab., MD;  Location: [place];  Service: Gastroenterology;  Laterality: N/A;   EUS N/A 11/28/2019   Procedure: UPPER ENDOSCOPIC ULTRASOUND (EUS) RADIAL;  Surgeon: [name], MD;  Location: [place];  Service: Gastroenterology;  Laterality: N/A;    I have reviewed the social history and family history with the patient and they are unchanged from previous note.  ALLERGIES:  has No Known Allergies.  MEDICATIONS:  Current Outpatient Medications  Medication Sig Dispense Refill   diphenhydrAMINE-APAP, sleep, (TYLENOL PM EXTRA STRENGTH) 50-1000 MG/30ML LIQD Take 15 mLs by mouth at bedtime as needed (sleep).     famotidine (PEPCID) 20 MG tablet TOME 1 PASTILLA POR VIA ORAL DOS VECES AL DIA 60 tablet 3   Ferrous Sulfate (IRON) 325 (65 Fe) MG TABS Take 1 tablet (325 mg total) by mouth 2 (two) times daily. 60 tablet 3   ferrous sulfate 325 (65 FE) MG tablet TAKE 1 TABLET BY MOUTH 2 TIMES DAILY. 60 tablet 3   gabapentin (NEURONTIN) 300 MG capsule Take 1 capsule (300 mg total) by mouth 3 (three) times daily. 90 capsule 0   HYDROcodone-acetaminophen (HYCET) 7.5-325 mg/15 ml solution Take 10 mLs by mouth  every 8 (eight) hours as needed for moderate pain. 473 mL 0   mirtazapine (REMERON) 7.5 MG tablet TOME 1 PASTILLA POR VIA ORAL A LA HORA DE ACOSTARSE (Patient not taking: Reported on 10/03/2020) 30 tablet 1   morphine (MS CONTIN) 30 MG 12 hr tablet Take 1 tablet (30 mg total) by mouth every 12 (twelve) hours. 60 tablet 0   pantoprazole (PROTONIX) 40 MG tablet Take 1 tablet (40 mg total) by mouth 2 (two) times daily before a meal. 60 tablet 3   prochlorperazine (COMPAZINE) 10 MG tablet Take 1 tablet by mouth every 6 hours as needed for nausea or vomiting. 60 tablet 2   sucralfate (CARAFATE) 1 g tablet Take 1 tablet by mouth 4 times daily. (Dissolve in 15 mls of water before taking) 120 tablet 2   No current facility-administered medications for this visit.   Facility-Administered Medications Ordered in Other Visits  Medication Dose Route Frequency Provider Last Rate Last Admin   CARBOplatin (PARAPLATIN) 220 mg in sodium chloride 0.9 % 250 mL chemo infusion  220 mg Intravenous Once [name], MD  diphenhydrAMINE (BENADRYL) 50 MG/ML injection            diphenhydrAMINE (BENADRYL) 50 MG/ML injection            famotidine (PEPCID) 20 mg IVPB            famotidine (PEPCID) 20 mg IVPB            famotidine (PEPCID) 20 mg IVPB            PACLitaxel (TAXOL) 114 mg in sodium chloride 0.9 % 250 mL chemo infusion (</= 58m/m2)  60 mg/m2 (Treatment Plan Recorded) Intravenous Once FTruitt Merle MD 269 mL/hr at 10/12/20 1321 114 mg at 10/12/20 1321   palonosetron (ALOXI) 0.25 MG/5ML injection            palonosetron (ALOXI) 0.25 MG/5ML injection            palonosetron (ALOXI) 0.25 MG/5ML injection             PHYSICAL EXAMINATION: ECOG PERFORMANCE STATUS: 2 - Symptomatic, <50% confined to bed  Vitals:   10/12/20 1018  BP: 114/83  Pulse: (!) 101  Resp: 18  Temp: 98.2 F (36.8 C)  SpO2: 100%   Wt Readings from Last 3 Encounters:  10/12/20 156 lb 12.8 oz (71.1 kg)  10/03/20 153 lb (69.4 kg)   09/28/20 153 lb 1.9 oz (69.5 kg)     GENERAL:alert, no distress and comfortable SKIN: skin color normal, no rashes or significant lesions EYES: normal, Conjunctiva are pink and non-injected, sclera clear  NEURO: alert & oriented x 3 with fluent speech  LABORATORY DATA:  I have reviewed the data as listed CBC Latest Ref Rng & Units 10/12/2020 09/28/2020 09/21/2020  WBC 4.0 - 10.5 K/uL 2.6(L) 4.5 4.1  Hemoglobin 13.0 - 17.0 g/dL 12.1(L) 11.3(L) 11.8(L)  Hematocrit 39.0 - 52.0 % 35.6(L) 32.2(L) 34.5(L)  Platelets 150 - 400 K/uL 160 172 205     CMP Latest Ref Rng & Units 10/12/2020 09/28/2020 09/21/2020  Glucose 70 - 99 mg/dL 110(H) 111(H) 113(H)  BUN 6 - 20 mg/dL 5(L) 4(L) 4(L)  Creatinine 0.61 - 1.24 mg/dL 0.62 0.60(L) 0.65  Sodium 135 - 145 mmol/L 138 138 136  Potassium 3.5 - 5.1 mmol/L 3.7 3.6 3.7  Chloride 98 - 111 mmol/L 104 104 104  CO2 22 - 32 mmol/L _0 Calcium 8.9 - 10.3 mg/dL 9.4 9.1 9.4  Total Protein 6.5 - 8.1 g/dL 7.4 7.2 7.5  Total Bilirubin 0.3 - 1.2 mg/dL 0.7 0.4 0.6  Alkaline Phos 38 - 126 U/L 78 77 79  AST 15 - 41 U/L _1 ALT 0 - 44 U/L _2 RADIOGRAPHIC STUDIES: I have personally reviewed the radiological images as listed and agreed with the findings in the report. No results found.    Orders Placed This Encounter  Procedures   NM PET Image Restage (PS) Skull Base to Thigh (F-18 FDG)    Standing Status:   Future    Standing Expiration Date:   10/12/2021    Order Specific Question:   If indicated for the ordered procedure, I authorize the administration of a radiopharmaceutical per Radiology protocol    Answer:   Yes    Order Specific Question:   [facility]    [facility]   Order Specific Question:   Radiology Contrast Protocol - do NOT remove file path    Answer:   \\  [email or web address]   All questions were answered. The patient knows to call the clinic with any  problems, questions or concerns. No barriers to learning was detected. The total time spent in the appointment was 30 minutes.     [name], MD 10/12/2020   I, Wilburn Mylar, am acting as scribe for [name], MD.   I have reviewed the above documentation for accuracy and completeness, and I agree with the above.

## 2020-10-12 NOTE — Patient Instructions (Signed)
[facility] MEDICAL ONCOLOGY   Discharge Instructions: Thank you for choosing [facility] to provide your oncology and hematology care.   If you have a lab appointment with the Cancer Center, please go directly to the Cancer Center and check in at the registration area.   Wear comfortable clothing and clothing appropriate for easy access to any Portacath or PICC line.   We strive to give you quality time with your provider. You may need to reschedule your appointment if you arrive late (15 or more minutes).  Arriving late affects you and other patients whose appointments are after yours.  Also, if you miss three or more appointments without notifying the office, you may be dismissed from the clinic at the provider's discretion.      For prescription refill requests, have your pharmacy contact our office and allow 72 hours for refills to be completed.    Today you received the following chemotherapy and/or immunotherapy agents: Pembrolizumab (Keytruda), Paclitaxel (Taxol), and Carboplatin      To help prevent nausea and vomiting after your treatment, we encourage you to take your nausea medication as directed.  BELOW ARE SYMPTOMS THAT SHOULD BE REPORTED IMMEDIATELY: *FEVER GREATER THAN 100.4 F (38 C) OR HIGHER *CHILLS OR SWEATING *NAUSEA AND VOMITING THAT IS NOT CONTROLLED WITH YOUR NAUSEA MEDICATION *UNUSUAL SHORTNESS OF BREATH *UNUSUAL BRUISING OR BLEEDING *URINARY PROBLEMS (pain or burning when urinating, or frequent urination) *BOWEL PROBLEMS (unusual diarrhea, constipation, pain near the anus) TENDERNESS IN MOUTH AND THROAT WITH OR WITHOUT PRESENCE OF ULCERS (sore throat, sores in mouth, or a toothache) UNUSUAL RASH, SWELLING OR PAIN  UNUSUAL VAGINAL DISCHARGE OR ITCHING   Items with * indicate a potential emergency and should be followed up as soon as possible or go to the Emergency Department if any problems should occur.  Please show the CHEMOTHERAPY  ALERT CARD or IMMUNOTHERAPY ALERT CARD at check-in to the Emergency Department and triage nurse.  Should you have questions after your visit or need to cancel or reschedule your appointment, please contact [facility]ICAL [facility]COL[facility]  Dept: [phone]  and follow the prompts.  Office hours are 8:00 a.m. to 4:30 p.m. Monday - Friday. Please note that voicemails left after 4:00 p.m. may not be returned until the following business day.  We are closed weekends and major holidays. You have access to a nurse at all times for urgent questions. Please call the main number to the clinic Dept: [phone] and follow the prompts.   For any non-urgent questions, you may also contact your provider using MyChart. We now offer e-Visits for anyone 18 and older to request care online for non-urgent symptoms. For details visit [email or web address].   Also download the MyChart app! Go to the app store, search "MyChart", open the app, select , and log in with your MyChart username and password.  Due to Covid, a mask is required upon entering the hospital/clinic. If you do not have a mask, one will be given to you upon arrival. For doctor visits, patients may have 1 support person aged 18 or older with them. For treatment visits, patients cannot have anyone with them due to current Covid guidelines and our immunocompromised population.   

## 2020-10-13 ENCOUNTER — Inpatient Hospital Stay: Payer: Self-pay

## 2020-10-13 VITALS — BP 106/84 | HR 110 | Temp 98.5°F | Resp 17

## 2020-10-13 DIAGNOSIS — D702 Other drug-induced agranulocytosis: Secondary | ICD-10-CM

## 2020-10-13 DIAGNOSIS — D500 Iron deficiency anemia secondary to blood loss (chronic): Secondary | ICD-10-CM

## 2020-10-13 MED ORDER — FILGRASTIM-SNDZ 300 MCG/0.5ML IJ SOSY
300.0000 ug | PREFILLED_SYRINGE | Freq: Once | INTRAMUSCULAR | Status: AC
  Administered 2020-10-13: 300 ug via SUBCUTANEOUS

## 2020-10-15 ENCOUNTER — Inpatient Hospital Stay: Payer: Self-pay

## 2020-10-15 ENCOUNTER — Other Ambulatory Visit: Payer: Self-pay

## 2020-10-15 VITALS — BP 117/83 | HR 113 | Temp 98.3°F | Resp 18

## 2020-10-15 DIAGNOSIS — D500 Iron deficiency anemia secondary to blood loss (chronic): Secondary | ICD-10-CM

## 2020-10-15 DIAGNOSIS — D702 Other drug-induced agranulocytosis: Secondary | ICD-10-CM

## 2020-10-15 MED ORDER — FILGRASTIM-SNDZ 300 MCG/0.5ML IJ SOSY
PREFILLED_SYRINGE | INTRAMUSCULAR | Status: AC
  Filled 2020-10-15: qty 0.5

## 2020-10-15 MED ORDER — FILGRASTIM-SNDZ 300 MCG/0.5ML IJ SOSY
300.0000 ug | PREFILLED_SYRINGE | Freq: Once | INTRAMUSCULAR | Status: AC
  Administered 2020-10-15: 300 ug via SUBCUTANEOUS

## 2020-10-19 ENCOUNTER — Inpatient Hospital Stay: Payer: Self-pay

## 2020-10-19 ENCOUNTER — Other Ambulatory Visit: Payer: Self-pay

## 2020-10-19 VITALS — BP 105/72 | HR 99 | Temp 98.9°F | Resp 18 | Ht 67.0 in | Wt 154.8 lb

## 2020-10-19 DIAGNOSIS — C154 Malignant neoplasm of middle third of esophagus: Secondary | ICD-10-CM

## 2020-10-19 LAB — COMPREHENSIVE METABOLIC PANEL
ALT: 9 U/L (ref 0–44)
AST: 20 U/L (ref 15–41)
Albumin: 3.7 g/dL (ref 3.5–5.0)
Alkaline Phosphatase: 76 U/L (ref 38–126)
Anion gap: 13 (ref 5–15)
BUN: 4 mg/dL — ABNORMAL LOW (ref 6–20)
CO2: 21 mmol/L — ABNORMAL LOW (ref 22–32)
Calcium: 9.4 mg/dL (ref 8.9–10.3)
Chloride: 104 mmol/L (ref 98–111)
Creatinine, Ser: 0.63 mg/dL (ref 0.61–1.24)
GFR, Estimated: >60 mL/min (ref >60)
Glucose, Bld: 104 mg/dL — ABNORMAL HIGH (ref 70–99)
Potassium: 3.6 mmol/L (ref 3.5–5.1)
Sodium: 138 mmol/L (ref 135–145)
Total Bilirubin: 0.4 mg/dL (ref 0.3–1.2)
Total Protein: 7.4 g/dL (ref 6.5–8.1)

## 2020-10-19 LAB — CBC WITH DIFFERENTIAL/PLATELET
Abs Immature Granulocytes: 0.11 10*3/uL — ABNORMAL HIGH (ref 0.00–0.07)
Basophils Absolute: 0 10*3/uL (ref 0.0–0.1)
Basophils Relative: 1 %
Eosinophils Absolute: 0.1 10*3/uL (ref 0.0–0.5)
Eosinophils Relative: 1 %
HCT: 32.1 % — ABNORMAL LOW (ref 39.0–52.0)
Hemoglobin: 11.3 g/dL — ABNORMAL LOW (ref 13.0–17.0)
Immature Granulocytes: 3 %
Lymphocytes Relative: 21 %
Lymphs Abs: 0.8 10*3/uL (ref 0.7–4.0)
MCH: 34.1 pg — ABNORMAL HIGH (ref 26.0–34.0)
MCHC: 35.2 g/dL (ref 30.0–36.0)
MCV: 97 fL (ref 80.0–100.0)
Monocytes Absolute: 0.6 10*3/uL (ref 0.1–1.0)
Monocytes Relative: 17 %
Neutro Abs: 2.1 10*3/uL (ref 1.7–7.7)
Neutrophils Relative %: 57 %
Platelets: 151 10*3/uL (ref 150–400)
RBC: 3.31 MIL/uL — ABNORMAL LOW (ref 4.22–5.81)
RDW: 13.9 % (ref 11.5–15.5)
WBC: 3.6 10*3/uL — ABNORMAL LOW (ref 4.0–10.5)
nRBC: 0 % (ref 0.0–0.2)

## 2020-10-19 MED ORDER — PALONOSETRON HCL INJECTION 0.25 MG/5ML
0.2500 mg | Freq: Once | INTRAVENOUS | Status: AC
  Administered 2020-10-19: 0.25 mg via INTRAVENOUS
  Filled 2020-10-19: qty 5

## 2020-10-19 MED ORDER — FAMOTIDINE 20 MG IN NS 100 ML IVPB
20.0000 mg | Freq: Once | INTRAVENOUS | Status: AC
  Administered 2020-10-19: 20 mg via INTRAVENOUS
  Filled 2020-10-19: qty 100

## 2020-10-19 MED ORDER — SODIUM CHLORIDE 0.9 % IV SOLN
222.9000 mg | Freq: Once | INTRAVENOUS | Status: AC
  Administered 2020-10-19: 220 mg via INTRAVENOUS
  Filled 2020-10-19: qty 22

## 2020-10-19 MED ORDER — SODIUM CHLORIDE 0.9 % IV SOLN: Freq: Once | INTRAVENOUS | Status: AC

## 2020-10-19 MED ORDER — DIPHENHYDRAMINE HCL 50 MG/ML IJ SOLN
50.0000 mg | Freq: Once | INTRAMUSCULAR | Status: AC
  Administered 2020-10-19: 50 mg via INTRAVENOUS
  Filled 2020-10-19: qty 1

## 2020-10-19 MED ORDER — SODIUM CHLORIDE 0.9 % IV SOLN
60.0000 mg/m2 | Freq: Once | INTRAVENOUS | Status: AC
  Administered 2020-10-19: 114 mg via INTRAVENOUS
  Filled 2020-10-19: qty 19

## 2020-10-19 NOTE — Patient Instructions (Addendum)
[facility]  Discharge Instructions: Thank you for choosing [facility] to provide your oncology and hematology care.   If you have a lab appointment with the Cancer Center, please go directly to the Cancer Center and check in at the registration area.   Wear comfortable clothing and clothing appropriate for easy access to any Portacath or PICC line.   We strive to give you quality time with your provider. You may need to reschedule your appointment if you arrive late (15 or more minutes).  Arriving late affects you and other patients whose appointments are after yours.  Also, if you miss three or more appointments without notifying the office, you may be dismissed from the clinic at the provider's discretion.      For prescription refill requests, have your pharmacy contact our office and allow 72 hours for refills to be completed.    Today you received the following chemotherapy and/or immunotherapy agents: Paclitaxel (Taxol), and Carboplatin.    To help prevent nausea and vomiting after your treatment, we encourage you to take your nausea medication as directed.  BELOW ARE SYMPTOMS THAT SHOULD BE REPORTED IMMEDIATELY: *FEVER GREATER THAN 100.4 F (38 C) OR HIGHER *CHILLS OR SWEATING *NAUSEA AND VOMITING THAT IS NOT CONTROLLED WITH YOUR NAUSEA MEDICATION *UNUSUAL SHORTNESS OF BREATH *UNUSUAL BRUISING OR BLEEDING *URINARY PROBLEMS (pain or burning when urinating, or frequent urination) *BOWEL PROBLEMS (unusual diarrhea, constipation, pain near the anus) TENDERNESS IN MOUTH AND THROAT WITH OR WITHOUT PRESENCE OF ULCERS (sore throat, sores in mouth, or a toothache) UNUSUAL RASH, SWELLING OR PAIN  UNUSUAL VAGINAL DISCHARGE OR ITCHING   Items with * indicate a potential emergency and should be followed up as soon as possible or go to the Emergency Department if any problems should occur.  Please show the CHEMOTHERAPY ALERT CARD or IMMUNOTHERAPY  ALERT CARD at check-in to the Emergency Department and triage nurse.  Should you have questions after your visit or need to cancel or reschedule your appointment, please contact [facility]  Dept: [phone]  and follow the prompts.  Office hours are 8:00 a.m. to 4:30 p.m. Monday - Friday. Please note that voicemails left after 4:00 p.m. may not be returned until the following business day.  We are closed weekends and major holidays. You have access to a nurse at all times for urgent questions. Please call the main number to the clinic Dept: [phone] and follow the prompts.   For any non-urgent questions, you may also contact your provider using MyChart. We now offer e-Visits for anyone 18 and older to request care online for non-urgent symptoms. For details visit [email or web address].   Also download the MyChart app! Go to the app store, search "MyChart", open the app, select , and log in with your MyChart username and password.  Due to Covid, a mask is required upon entering the hospital/clinic. If you do not have a mask, one will be given to you upon arrival. For doctor visits, patients may have 1 support person aged 18 or older with them. For treatment visits, patients cannot have anyone with them due to current Covid guidelines and our immunocompromised population.   

## 2020-10-20 ENCOUNTER — Inpatient Hospital Stay: Payer: Self-pay

## 2020-10-20 DIAGNOSIS — C154 Malignant neoplasm of middle third of esophagus: Secondary | ICD-10-CM

## 2020-10-22 ENCOUNTER — Inpatient Hospital Stay: Payer: Self-pay

## 2020-10-22 ENCOUNTER — Other Ambulatory Visit: Payer: Self-pay

## 2020-10-22 ENCOUNTER — Other Ambulatory Visit: Payer: Self-pay | Admitting: Hematology

## 2020-10-22 VITALS — BP 118/84 | HR 109 | Temp 98.6°F | Resp 16

## 2020-10-22 DIAGNOSIS — D702 Other drug-induced agranulocytosis: Secondary | ICD-10-CM

## 2020-10-22 DIAGNOSIS — D500 Iron deficiency anemia secondary to blood loss (chronic): Secondary | ICD-10-CM

## 2020-10-22 MED ORDER — FILGRASTIM-SNDZ 300 MCG/0.5ML IJ SOSY
300.0000 ug | PREFILLED_SYRINGE | Freq: Once | INTRAMUSCULAR | Status: AC
Start: 2020-10-22 — End: 2020-10-22
  Administered 2020-10-22: 300 ug via SUBCUTANEOUS

## 2020-10-31 ENCOUNTER — Ambulatory Visit (AMBULATORY_SURGERY_CENTER): Payer: Self-pay | Admitting: Internal Medicine

## 2020-10-31 ENCOUNTER — Other Ambulatory Visit: Payer: Self-pay

## 2020-10-31 ENCOUNTER — Encounter: Payer: Self-pay | Admitting: Internal Medicine

## 2020-10-31 DIAGNOSIS — K222 Esophageal obstruction: Secondary | ICD-10-CM

## 2020-10-31 DIAGNOSIS — K2289 Other specified disease of esophagus: Secondary | ICD-10-CM

## 2020-10-31 DIAGNOSIS — R1310 Dysphagia, unspecified: Secondary | ICD-10-CM

## 2020-10-31 DIAGNOSIS — R1314 Dysphagia, pharyngoesophageal phase: Secondary | ICD-10-CM

## 2020-10-31 DIAGNOSIS — Z8501 Personal history of malignant neoplasm of esophagus: Secondary | ICD-10-CM

## 2020-10-31 DIAGNOSIS — K209 Esophagitis, unspecified without bleeding: Secondary | ICD-10-CM

## 2020-10-31 MED ORDER — SODIUM CHLORIDE 0.9 % IV SOLN: 500.0000 mL | Freq: Once | INTRAVENOUS | Status: DC

## 2020-10-31 NOTE — Progress Notes (Signed)
Report to PACU, RN, vss, BBS= Clear.  

## 2020-10-31 NOTE — Op Note (Signed)
[place] Patient Name: Jimmy Hawkins Procedure Date: 10/31/2020 9:50 AM MRN: [id number] Endoscopist: [name] , MD Age: 45 Referring MD:  Date of Birth: Nov 22, 1975 Gender: Male Account #: [id number] Procedure:                Upper GI endoscopy Indications:              Dysphagia, Stricture of the esophagus, Follow-up of                            esophageal stricture, For therapy of esophageal                            stricture Medicines:                Propofol per Anesthesia, Monitored Anesthesia Care Procedure:                Pre-Anesthesia Assessment:                           - Prior to the procedure, a History and Physical                            was performed, and patient medications and                            allergies were reviewed. The patient's tolerance of                            previous anesthesia was also reviewed. The risks                            and benefits of the procedure and the sedation                            options and risks were discussed with the patient.                            All questions were answered, and informed consent                            was obtained. Prior Anticoagulants: The patient has                            taken no previous anticoagulant or antiplatelet                            agents. ASA Grade Assessment: III - A patient with                            severe systemic disease. After reviewing the risks                            and benefits, the patient was deemed in  satisfactory condition to undergo the procedure.                           After obtaining informed consent, the endoscope was                            passed under direct vision. Throughout the                            procedure, the patient's blood pressure, pulse, and                            oxygen saturations were monitored continuously. The                            GIF  [id number] #[id number] was introduced through the                            mouth, and advanced to the antrum of the stomach.                            The upper GI endoscopy was accomplished without                            difficulty. The patient tolerated the procedure                            well. Scope In: Scope Out: Findings:                 One benign-appearing, intrinsic severe (stenosis;                            an endoscope cannot pass) stenosis was found 34 to                            36 cm from the incisors. Associated esophagitis                            seen.This stenosis measured 1.2 cm (inner diameter)                            x 2 cm (in length). The stenosis was traversed                            after dilation. A TTS dilator was passed through                            the scope. Dilation with 12, 13.5, 15 and 16.5 mm                            dilators was performed to 16.5 mm. The dilation  site was examined and showed moderate improvement                            in luminal narrowing. Estimated blood loss was                            minimal.                           The exam was otherwise without abnormality.                           The cardia and gastric fundus were normal on                            retroflexion. Complications:            No immediate complications. Estimated Blood Loss:     Estimated blood loss was minimal. 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 Dilated to 16.5                            mm (last time 9/7 was 15.5 max)                           - The examination was otherwise normal.                           - No specimens collected. Recommendation:           - Patient has a contact number available for                            emergencies. The signs and symptoms of potential                             delayed complications were discussed with the                            patient. Return to normal activities tomorrow.                            Written discharge instructions were provided to the                            patient.                           - Clear liquids x 1 hour then soft foods rest of                            day. Start prior diet tomorrow.                           -  Continue present medications.                           - Repeat upper endoscopy 11/20/20 at [place], MD 10/31/2020 10:24:39 AM This report has been signed electronically.

## 2020-10-31 NOTE — Progress Notes (Signed)
[facility] History and Physical   Primary Care Physician:  [name], MD   Reason for Procedure:   Esophageal stricture dilation  Plan:    Upper endoscopy and esophageal dilation     HPI: Jimmy Hawkins is a 45 y.o. 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 He has been undergoing sequential dilation of the stricture and here is for repeat exam and treatment.   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    Past Surgical History:  Procedure Laterality Date   BIOPSY  08/11/2019   Procedure: BIOPSY;  Surgeon: [name], MD;  Location: [facility] ENDOSCOPY;  Service: Endoscopy;;   BIOPSY  11/28/2019   Procedure: BIOPSY;  Surgeon: [name], MD;  Location: [facility] ENDOSCOPY;  Service: Gastroenterology;;   ESOPHAGOGASTRODUODENOSCOPY  08/11/2019   ESOPHAGOGASTRODUODENOSCOPY (EGD) WITH PROPOFOL N/A 08/11/2019   Procedure: ESOPHAGOGASTRODUODENOSCOPY (EGD) WITH PROPOFOL;  Surgeon: [name], MD;  Location: [place];  Service: Endoscopy;  Laterality: N/A;   ESOPHAGOGASTRODUODENOSCOPY (EGD) WITH PROPOFOL N/A 11/28/2019   Procedure: ESOPHAGOGASTRODUODENOSCOPY (EGD) WITH PROPOFOL;  Surgeon: Rush Landmark Telford Nab., MD;  Location: [place];  Service: Gastroenterology;  Laterality: N/A;   EUS N/A 11/28/2019   Procedure: UPPER ENDOSCOPIC ULTRASOUND (EUS) RADIAL;  Surgeon: [name], MD;  Location: [place];  Service: Gastroenterology;  Laterality: N/A;    Prior to Admission medications   Medication Sig Start Date End Date Taking? Authorizing Provider  gabapentin (NEURONTIN) 300 MG capsule Take 1 capsule (300 mg total) by mouth 3 (three) times daily. 07/06/20  Yes [name], MD  HYDROcodone-acetaminophen (HYCET) 7.5-325 mg/15 ml solution Take 10 mLs by mouth  every 8 (eight) hours as needed for moderate pain. 10/12/20 04/10/21 Yes [name], MD  mirtazapine (REMERON) 7.5 MG tablet TOME 1 PASTILLA POR VIA ORAL A LA HORA DE ACOSTARSE 09/21/20 09/21/21 Yes [name], MD  morphine (MS CONTIN) 30 MG 12 hr tablet Take 1 tablet (30 mg total) by mouth every 12 (twelve) hours. 10/12/20 04/10/21 Yes [name], MD  pantoprazole (PROTONIX) 40 MG tablet Take 1 tablet (40 mg total) by mouth 2 (two) times daily before a meal. 10/12/20  Yes [name], MD  prochlorperazine (COMPAZINE) 10 MG tablet Take 1 tablet by mouth every 6 hours as needed for nausea or vomiting. 10/12/20 10/12/21 Yes [name], MD  diphenhydrAMINE-APAP, sleep, (TYLENOL PM EXTRA STRENGTH) 50-1000 MG/30ML LIQD Take 15 mLs by mouth at bedtime as needed (sleep).    [provider]  famotidine (PEPCID) 20 MG tablet TOME 1 PASTILLA POR VIA ORAL DOS VECES AL DIA 12/20/19 12/19/20  [name], MD  Ferrous Sulfate (IRON) 325 (65 Fe) MG TABS Take 1 tablet (325 mg total) by mouth 2 (two) times daily. 07/20/20 10/03/20  [name], MD  ferrous sulfate 325 (65 FE) MG tablet TAKE 1 TABLET BY MOUTH 2 TIMES DAILY. 05/18/20 05/18/21  [name], MD  sucralfate (CARAFATE) 1 g tablet Take 1 tablet by mouth 4 times daily. (Dissolve in 15 mls of water before taking) 09/06/20   Levin Erp, PA  omeprazole (PRILOSEC) 40 MG capsule Take 1 capsule (40 mg total) by mouth daily. 09/12/19 11/28/19  [name], MD    Current Outpatient Medications  Medication Sig Dispense Refill   gabapentin (NEURONTIN) 300 MG capsule Take 1 capsule (300  mg total) by mouth 3 (three) times daily. 90 capsule 0   HYDROcodone-acetaminophen (HYCET) 7.5-325 mg/15 ml solution Take 10 mLs by mouth every 8 (eight) hours as needed for moderate pain. 473 mL 0   mirtazapine (REMERON) 7.5 MG tablet TOME 1 PASTILLA POR VIA ORAL A LA HORA DE ACOSTARSE 30 tablet 1   morphine (MS CONTIN) 30 MG 12 hr tablet Take 1 tablet (30 mg total) by mouth every 12 (twelve)  hours. 60 tablet 0   pantoprazole (PROTONIX) 40 MG tablet Take 1 tablet (40 mg total) by mouth 2 (two) times daily before a meal. 60 tablet 3   prochlorperazine (COMPAZINE) 10 MG tablet Take 1 tablet by mouth every 6 hours as needed for nausea or vomiting. 60 tablet 2   diphenhydrAMINE-APAP, sleep, (TYLENOL PM EXTRA STRENGTH) 50-1000 MG/30ML LIQD Take 15 mLs by mouth at bedtime as needed (sleep).     famotidine (PEPCID) 20 MG tablet TOME 1 PASTILLA POR VIA ORAL DOS VECES AL DIA 60 tablet 3   Ferrous Sulfate (IRON) 325 (65 Fe) MG TABS Take 1 tablet (325 mg total) by mouth 2 (two) times daily. 60 tablet 3   ferrous sulfate 325 (65 FE) MG tablet TAKE 1 TABLET BY MOUTH 2 TIMES DAILY. 60 tablet 3   sucralfate (CARAFATE) 1 g tablet Take 1 tablet by mouth 4 times daily. (Dissolve in 15 mls of water before taking) 120 tablet 2   Current Facility-Administered Medications  Medication Dose Route Frequency Provider Last Rate Last Admin   0.9 %  sodium chloride infusion  500 mL Intravenous Once Nandigam, Venia Minks, MD       Facility-Administered Medications Ordered in Other Visits  Medication Dose Route Frequency Provider Last Rate Last Admin   diphenhydrAMINE (BENADRYL) 50 MG/ML injection            diphenhydrAMINE (BENADRYL) 50 MG/ML injection            famotidine (PEPCID) 20 mg IVPB            famotidine (PEPCID) 20 mg IVPB            palonosetron (ALOXI) 0.25 MG/5ML injection            palonosetron (ALOXI) 0.25 MG/5ML injection             Allergies as of 10/31/2020   (No Known Allergies)    Family History  Problem Relation Age of Onset   Esophageal cancer Mother    Stomach cancer Mother    Healthy Father    Colon cancer Neg Hx    Rectal cancer Neg Hx     Social History   Socioeconomic History   Marital status: Single    Spouse name: Not on file   Number of children: Not on file   Years of education: Not on file   Highest education level: Not on file  Occupational History   Not  on file  Tobacco Use   Smoking status: Never   Smokeless tobacco: Never  Vaping Use   Vaping Use: Never used  Substance and Sexual Activity   Alcohol use: Not Currently    Alcohol/week: 6.0 standard drinks    Types: 6 Cans of beer per week   Drug use: Never   Sexual activity: Not on file  Other Topics Concern   Not on file  Social History Narrative   Not on file   Social Determinants of Health   Financial Resource Strain: Not on file  Food Insecurity: Not on file  Transportation Needs: Not on file  Physical Activity: Not on file  Stress: Not on file  Social Connections: Not on file  Intimate Partner Violence: Not on file  Review of Systems:  All other review of systems negative except as mentioned in the HPI.  Physical Exam: Vital signs BP 110/71   Pulse (!) 112   Temp 98.4 F (36.9 C) (Temporal)   Ht 5\' 7"  (1.702 m)   Wt 153 lb (69.4 kg)   SpO2 98%   BMI 23.96 kg/m   General:   Alert,  Well-developed, well-nourished, pleasant and cooperative in NAD Lungs:  Clear throughout to auscultation.   Heart:  Regular rate and rhythm; no murmurs, clicks, rubs,  or gallops. Abdomen:  Soft, nontender and nondistended. Normal bowel sounds.   Neuro/Psych:  Alert and cooperative. Normal mood and affect. A and O x 3   @Armond Cuthrell  Simonne Maffucci, MD, [facility] [phone] (pager) 10/31/2020 9:50 AM@

## 2020-10-31 NOTE — Progress Notes (Signed)
DT-VS  Pt's states no medical or surgical changes since previsit or office visit.

## 2020-10-31 NOTE — Patient Instructions (Addendum)
I stretched the esophagus more today. 16.5 mm, last time it was 15.5 mm.  Let's repeat this again - we will; make another appointment for October 25 at [facility].  I appreciate the opportunity to care for you. [name], MD, [facility] Start post dilation diet today   YOU HAD AN ENDOSCOPIC PROCEDURE TODAY AT [facility]:   Refer to the procedure report that was given to you for any specific questions about what was found during the examination.  If the procedure report does not answer your questions, please call your gastroenterologist to clarify.  If you requested that your care partner not be given the details of your procedure findings, then the procedure report has been included in a sealed envelope for you to review at your convenience later.  YOU SHOULD EXPECT: Some feelings of bloating in the abdomen. Passage of more gas than usual.  Walking can help get rid of the air that was put into your GI tract during the procedure and reduce the bloating. If you had a lower endoscopy (such as a colonoscopy or flexible sigmoidoscopy) you may notice spotting of blood in your stool or on the toilet paper. If you underwent a bowel prep for your procedure, you may not have a normal bowel movement for a few days.  Please Note:  You might notice some irritation and congestion in your nose or some drainage.  This is from the oxygen used during your procedure.  There is no need for concern and it should clear up in a day or so.  SYMPTOMS TO REPORT IMMEDIATELY:  Following upper endoscopy (EGD)  Vomiting of blood or coffee ground material  New chest pain or pain under the shoulder blades  Painful or persistently difficult swallowing  New shortness of breath  Fever of 100F or higher  Black, tarry-looking stools  For urgent or emergent issues, a gastroenterologist can be reached at any hour by calling [phone]. Do not use MyChart messaging for urgent concerns.   DIET:  We do recommend a  small meal at first, but then you may proceed to your regular diet.  Drink plenty of fluids but you should avoid alcoholic beverages for 24 hours.  ACTIVITY:  You should plan to take it easy for the rest of today and you should NOT DRIVE or use heavy machinery until tomorrow (because of the sedation medicines used during the test).    FOLLOW UP: Our staff will call the number listed on your records 48-72 hours following your procedure to check on you and address any questions or concerns that you may have regarding the information given to you following your procedure. If we do not reach you, we will leave a message.  We will attempt to reach you two times.  During this call, we will ask if you have developed any symptoms of COVID 19. If you develop any symptoms (ie: fever, flu-like symptoms, shortness of breath, cough etc.) before then, please call [phone].  If you test positive for Covid 19 in the 2 weeks post procedure, please call and report this information to [name].    If any biopsies were taken you will be contacted by phone or by letter within the next 1-3 weeks.  Please call us at [phone] if you have not heard about the biopsies in 3 weeks.   SIGNATURES/CONFIDENTIALITY: You and/or your care partner have signed paperwork which will be entered into your electronic medical record.  These signatures attest to  the fact that that the information above on your After Visit Summary has been reviewed and is understood.  Full responsibility of the confidentiality of this discharge information lies with you and/or your care-partner.

## 2020-10-31 NOTE — Progress Notes (Signed)
Called to room to assist during endoscopic procedure.  Patient ID and intended procedure confirmed with present staff. Received instructions for my participation in the procedure from the performing physician.  

## 2020-11-01 ENCOUNTER — Other Ambulatory Visit: Payer: Self-pay | Admitting: Hematology

## 2020-11-02 ENCOUNTER — Encounter: Payer: Self-pay | Admitting: Hematology

## 2020-11-02 ENCOUNTER — Inpatient Hospital Stay (HOSPITAL_BASED_OUTPATIENT_CLINIC_OR_DEPARTMENT_OTHER): Payer: Self-pay | Admitting: Hematology

## 2020-11-02 ENCOUNTER — Telehealth: Payer: Self-pay | Admitting: *Deleted

## 2020-11-02 ENCOUNTER — Encounter: Payer: Self-pay | Admitting: Physician Assistant

## 2020-11-02 ENCOUNTER — Inpatient Hospital Stay: Payer: Self-pay

## 2020-11-02 ENCOUNTER — Other Ambulatory Visit (HOSPITAL_COMMUNITY): Payer: Self-pay

## 2020-11-02 ENCOUNTER — Inpatient Hospital Stay: Payer: Self-pay | Attending: Hematology

## 2020-11-02 ENCOUNTER — Telehealth: Payer: Self-pay | Admitting: Hematology

## 2020-11-02 ENCOUNTER — Other Ambulatory Visit: Payer: Self-pay

## 2020-11-02 VITALS — BP 100/67 | HR 81 | Temp 99.9°F | Resp 16 | Wt 152.2 lb

## 2020-11-02 DIAGNOSIS — G893 Neoplasm related pain (acute) (chronic): Secondary | ICD-10-CM | POA: Insufficient documentation

## 2020-11-02 DIAGNOSIS — Z5189 Encounter for other specified aftercare: Secondary | ICD-10-CM | POA: Insufficient documentation

## 2020-11-02 DIAGNOSIS — Z5111 Encounter for antineoplastic chemotherapy: Secondary | ICD-10-CM | POA: Insufficient documentation

## 2020-11-02 DIAGNOSIS — Z923 Personal history of irradiation: Secondary | ICD-10-CM | POA: Insufficient documentation

## 2020-11-02 DIAGNOSIS — C154 Malignant neoplasm of middle third of esophagus: Secondary | ICD-10-CM

## 2020-11-02 DIAGNOSIS — R531 Weakness: Secondary | ICD-10-CM | POA: Insufficient documentation

## 2020-11-02 DIAGNOSIS — C7951 Secondary malignant neoplasm of bone: Secondary | ICD-10-CM | POA: Insufficient documentation

## 2020-11-02 DIAGNOSIS — Z79899 Other long term (current) drug therapy: Secondary | ICD-10-CM | POA: Insufficient documentation

## 2020-11-02 DIAGNOSIS — R634 Abnormal weight loss: Secondary | ICD-10-CM | POA: Insufficient documentation

## 2020-11-02 DIAGNOSIS — R1310 Dysphagia, unspecified: Secondary | ICD-10-CM | POA: Insufficient documentation

## 2020-11-02 DIAGNOSIS — Z5112 Encounter for antineoplastic immunotherapy: Secondary | ICD-10-CM | POA: Insufficient documentation

## 2020-11-02 DIAGNOSIS — F1020 Alcohol dependence, uncomplicated: Secondary | ICD-10-CM | POA: Insufficient documentation

## 2020-11-02 LAB — CBC WITH DIFFERENTIAL/PLATELET
Abs Immature Granulocytes: 0.06 10*3/uL (ref 0.00–0.07)
Basophils Absolute: 0 10*3/uL (ref 0.0–0.1)
Basophils Relative: 0 %
Eosinophils Absolute: 0.1 10*3/uL (ref 0.0–0.5)
Eosinophils Relative: 1 %
HCT: 34.5 % — ABNORMAL LOW (ref 39.0–52.0)
Hemoglobin: 11.7 g/dL — ABNORMAL LOW (ref 13.0–17.0)
Immature Granulocytes: 1 %
Lymphocytes Relative: 14 %
Lymphs Abs: 0.6 10*3/uL — ABNORMAL LOW (ref 0.7–4.0)
MCH: 33.2 pg (ref 26.0–34.0)
MCHC: 33.9 g/dL (ref 30.0–36.0)
MCV: 98 fL (ref 80.0–100.0)
Monocytes Absolute: 0.7 10*3/uL (ref 0.1–1.0)
Monocytes Relative: 16 %
Neutro Abs: 3.1 10*3/uL (ref 1.7–7.7)
Neutrophils Relative %: 68 %
Platelets: 215 10*3/uL (ref 150–400)
RBC: 3.52 MIL/uL — ABNORMAL LOW (ref 4.22–5.81)
RDW: 14.1 % (ref 11.5–15.5)
WBC: 4.6 10*3/uL (ref 4.0–10.5)
nRBC: 0 % (ref 0.0–0.2)

## 2020-11-02 LAB — COMPREHENSIVE METABOLIC PANEL
ALT: 11 U/L (ref 0–44)
AST: 21 U/L (ref 15–41)
Albumin: 3.8 g/dL (ref 3.5–5.0)
Alkaline Phosphatase: 78 U/L (ref 38–126)
Anion gap: 11 (ref 5–15)
BUN: 5 mg/dL — ABNORMAL LOW (ref 6–20)
CO2: 21 mmol/L — ABNORMAL LOW (ref 22–32)
Calcium: 9.6 mg/dL (ref 8.9–10.3)
Chloride: 104 mmol/L (ref 98–111)
Creatinine, Ser: 0.65 mg/dL (ref 0.61–1.24)
GFR, Estimated: >60 mL/min (ref >60)
Glucose, Bld: 111 mg/dL — ABNORMAL HIGH (ref 70–99)
Potassium: 3.5 mmol/L (ref 3.5–5.1)
Sodium: 136 mmol/L (ref 135–145)
Total Bilirubin: 0.6 mg/dL (ref 0.3–1.2)
Total Protein: 7.8 g/dL (ref 6.5–8.1)

## 2020-11-02 MED ORDER — SODIUM CHLORIDE 0.9 % IV SOLN
222.9000 mg | Freq: Once | INTRAVENOUS | Status: AC
  Administered 2020-11-02: 220 mg via INTRAVENOUS
  Filled 2020-11-02: qty 22

## 2020-11-02 MED ORDER — FAMOTIDINE 20 MG IN NS 100 ML IVPB
20.0000 mg | Freq: Once | INTRAVENOUS | Status: AC
  Administered 2020-11-02: 20 mg via INTRAVENOUS
  Filled 2020-11-02: qty 100

## 2020-11-02 MED ORDER — SODIUM CHLORIDE 0.9 % IV SOLN: Freq: Once | INTRAVENOUS | Status: DC | PRN

## 2020-11-02 MED ORDER — DIPHENHYDRAMINE HCL 50 MG/ML IJ SOLN
50.0000 mg | Freq: Once | INTRAMUSCULAR | Status: AC
  Administered 2020-11-02: 50 mg via INTRAVENOUS
  Filled 2020-11-02: qty 1

## 2020-11-02 MED ORDER — HYDROCODONE-ACETAMINOPHEN 7.5-325 MG/15ML PO SOLN
10.0000 mL | Freq: Three times a day (TID) | ORAL | 0 refills | Status: DC | PRN
  Filled 2020-11-02: qty 473, 16d supply, fill #0

## 2020-11-02 MED ORDER — SODIUM CHLORIDE 0.9 % IV SOLN
60.0000 mg/m2 | Freq: Once | INTRAVENOUS | Status: AC
  Administered 2020-11-02: 114 mg via INTRAVENOUS
  Filled 2020-11-02: qty 19

## 2020-11-02 MED ORDER — LORATADINE 10 MG PO TABS
10.0000 mg | ORAL_TABLET | Freq: Every day | ORAL | Status: DC
  Administered 2020-11-02: 10 mg via ORAL

## 2020-11-02 MED ORDER — LORATADINE 10 MG PO TABS
ORAL_TABLET | ORAL | Status: AC
  Filled 2020-11-02: qty 1

## 2020-11-02 MED ORDER — PALONOSETRON HCL INJECTION 0.25 MG/5ML
0.2500 mg | Freq: Once | INTRAVENOUS | Status: AC
  Administered 2020-11-02: 0.25 mg via INTRAVENOUS
  Filled 2020-11-02: qty 5

## 2020-11-02 MED ORDER — SODIUM CHLORIDE 0.9 % IV SOLN
200.0000 mg | Freq: Once | INTRAVENOUS | Status: AC
  Administered 2020-11-02: 200 mg via INTRAVENOUS
  Filled 2020-11-02: qty 8

## 2020-11-02 MED ORDER — SODIUM CHLORIDE 0.9 % IV SOLN: Freq: Once | INTRAVENOUS | Status: AC

## 2020-11-02 NOTE — Progress Notes (Signed)
Pt does not have port. Labs drawn peripherally.  

## 2020-11-02 NOTE — Progress Notes (Signed)
[facility]   [phone] Fax:([phone]) [phone]   Clinic Follow up Note   Patient Care Team: [name], MD as PCP - General (Hematology) [name], MD as Consulting Physician (Oncology)  Date of Service:  11/02/2020  CHIEF COMPLAINT: f/u of esophageal cancer  CURRENT THERAPY:  First-line Keytruda q3weeks starting 04/27/20, added weekly carbo AUC 1.5 and Taxol 39m/m2 from cycle 4 (06/29/20) on day 1, 8 every 21 days   ASSESSMENT & PLAN:  Jimmy Bartolomeiis a 45y.o. male with   1. 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 08/11/19 CT CAP found him to have mid esophageal mass and enlarged AP window node, no distant metastasis. -He completed 6 weeks of concurrent chemoRT with weekly Carboplatin and Taxol 08/29/19-10/06/19 -Repeat PET scan October 2021 showed hypermetabolic wall thickening of the distal half of the thoracic esophageus, no notable distant metastasis.  -He received CAPOX q3weeks with Xeloda 15087mBID 2 weeks on/1 week off 12/20/19-02/2020. -He completed palliative Radiation to sacrum with Dr MoLisbeth Renshaw/28/22-3/15/22.  -He has started First-line single agent Keytruda 04/27/20. Due to his slow improvement and squamous cell histology, we added chemo with carboplatin and Taxol 2 weeks on/1 week off from C4 on 06/29/20. He receives zarxio as needed for neutropenia. -His PET from 07/17/20 showed Left sacral metastasis is slightly enlarged and slightly increased in peripheral hypermetabolism. No additional evidence of metastatic disease. Will continue current regimen. -Labs reviewed and adequate to proceed with Keytruda and chemo today. -he is scheduled for PET on 11/08/20. Will see him after PET    2. Sacral/back pain, LE weakness, secondary to bone metastasis -He was seen to have bone mets on 04/11/20 PET scan. -He  completed palliative Radiation to sacrum with Dr MoLisbeth Renshaw/28/22-3/15/22. He was given short course dexa 15m61m -PET 07/17/20 showed enlargement and slight increase in peripheral hypermetabolism to left sacral metastasis. -He is currently taking morphine SR BID and hydrocodone 10cc TID. I refilled his hydrocodone today.   3. H/o Heavy alcohol user -He quit drinking since cancer diagnosis, but started again. Due to weakness, he is not able to drink as much since 04/13/20 and stopped overall 06/29/20 -on 10/12/20, he reported he is back to drinking 5 beers a day. I previously advised him to abstain for the day of and a few following chemo.   4. Social and Financial Support  -Patient does not have insurance and is not currently working. He has already applied for Medicaid. -He has met with financial advocate Shauna and SW and he has grant assistance with medications. -Will see if he can get drug replacement for Keytruda. -Patient, family and Interpretor notes difficulty with communication. They will set up Mychart for more direct line.    5. Dysphagia and weight loss -He continues to have difficulty swallowing, and as a result has lost weight. -he is undergoing esophageal dilation under Dr. GesCarlean Purlhe reports increased appetite on mirtazapine. -f/u with dietician      PLAN: -proceed with C11Frontierarbo and taxol today, chemo day 8 next week  -add zarxio on day 2 and 4 of each treatment -PET scan 11/08/20  -I will see him on 11/09/20 to discuss the results -I refilled hydrocodone  -will increase MS contin 63m515mom q12h to q8h for better pain control    No problem-specific Assessment & Plan notes found for  this encounter.   SUMMARY OF ONCOLOGIC HISTORY: Oncology History Overview Note  Cancer Staging Malignant neoplasm of middle third of esophagus ([place]) Staging form: Esophagus - Squamous Cell Carcinoma, AJCC 8th Edition - Clinical: Stage Unknown (cTX, cN1, cM0) - Signed by [name],  MD on 08/18/2019    Malignant neoplasm of middle third of esophagus [facility])   Initial Diagnosis   Malignant neoplasm of middle third of esophagus ([place])   08/11/2019 Imaging   CT CAP W contrast 08/11/19 IMPRESSION: 1. Mid to lower esophageal mass along an 8 cm segment, large endoluminal component, strongly favoring esophageal malignancy. Borderline enlarged AP window lymph node. No findings of distant metastatic disease. 2. Trace bilateral pleural effusions. 3. Diffuse hepatic steatosis. 4. Cholelithiasis. 5. Fatty spermatic cords likely due to small bilateral indirect inguinal hernias.   08/11/2019 Procedure   EGD by Dr Carlean Purl  IMPRESSION - Partially obstructing, likely malignant esophageal tumor was found in the upper third of the esophagus and in the middle third of the esophagus. Biopsied. Very large and long - difficult but able to advance scope by this lesion - await CT for better length estimate - Normal stomach. - Normal examined duodenum.   08/11/2019 Initial Biopsy   FINAL MICROSCOPIC DIAGNOSIS:   A. ESOPHAGUS, UPPER, BIOPSY:  - Squamous cell carcinoma.    08/11/2019 Genetic Testing   PD-L1 Expression 90%   08/18/2019 Cancer Staging   Staging form: Esophagus - Squamous Cell Carcinoma, AJCC 8th Edition - Clinical: Stage Unknown (cTX, cN1, cM0) - Signed by [name], MD on 08/18/2019   08/29/2019 - 10/04/2019 Chemotherapy   Concurrent chemoradiation with weekly CT for 6 weeks starting 08/29/19-10/04/19    08/29/2019 - 10/06/2019 Radiation Therapy   Concurrent chemoradiation with Dr Lisbeth Renshaw starting 08/29/19-10/06/19   11/11/2019 PET scan   IMPRESSION: 1. Again seen is a long segment of FDG avid wall thickening of the distal half of the thoracic esophagus compatible with known esophageal neoplasm. 2. No signs of FDG avid nodal or distant metastatic disease.     11/28/2019 Procedure   EUS by Dr Rush Landmark IMPRESSION EGD Impression: - No gross lesions in esophagus proximally. -  Previously noted esophageal cancer is not present. However, in its place and extending distal to this is likely LA Grade D chemotherapy/radiation esophagitis with bleeding. Biopsied after the EUS was complete to evaluate for persistent disease or not. - Z-line regular, 37 cm from the incisors. - 3 cm hiatal hernia. - Erythematous mucosa in the stomach. No other gross lesions in the stomach. Biopsied. - No gross lesions in the duodenal bulb, in the first portion of the duodenum and in the second portion of the duodenum. EUS Impression: - Wall thickening was seen in the thoracic esophagus into the gastroesophageal junction. The thickening appeared to primarily be within the luminal interface/superficial mucosa (Layer 1) and deep mucosa (Layer 2). The previously noted esophageal cancer has been replaced by what appears to be radiation/chemotherapy induced changes. With the limitations of having to use the mini-probe EUS, I could not visualize persistent significant disease other than the entire distal area of the esophagus having likely changes noted as above. - No malignant-appearing lymph nodes were visualized in the middle paraesophageal mediastinum (level 58M), lower paraesophageal mediastinum (level 8L), diaphragmatic region (level 15) and paracardial region (level 16).      FINAL MICROSCOPIC DIAGNOSIS:   A. STOMACH, BIOPSY:  - Mildly active chronic gastritis.  - Warthin-Starry negative for Helicobacter pylori.  - No intestinal metaplasia, dysplasia  or carcinoma.   B. ESOPHAGUS, BIOPSY:  - Inflamed granulation tissue and exudate consistent with ulcer.  - No malignancy identified.    12/20/2019 - 02/2020 Chemotherapy   CAPOX q3weeks with Xeloda 1556m BID 2 weeks on/1 week off starting 12/20/19-02/2020   03/21/2020 Imaging   [facility]--Circumferential thickening of the lower third of the esophagus which corresponds with known esophageal malignancy. No evidence of metastatic  disease within the chest.  --For findings below the diaphragm, please see concurrent separately reported CT of the abdomen and pelvis.  --Expansile lytic mass with associated soft tissue component centered in the left sacral ala and abutting the left S1 and S2 nerve roots, left internal iliac vasculature, and left pyriformis muscle with no other metastatic disease within the abdomen or pelvis.   --Diffuse thickening of the distal esophagus compatible with known esophageal malignancy.   --Cholelithiasis with nonspecific gallbladder distention. Clinical correlation is recommended. If there is clinical concern for cholecystitis, further evaluation with ultrasound can be obtained.   03/21/2020 Imaging   CT Lumbar Spine  Expansile lytic lesion centered in the left sacral ala with no other evidence of metastatic disease in the lumbar spine.   03/26/2020 - 04/10/2020 Radiation Therapy   Palliative Radiation to Sacrum with Dr MLisbeth Renshaw2/28/22-3/15/22.    04/11/2020 Progression   PET  IMPRESSION: 1. New hypermetabolic expansile 6.1 cm upper left sacral lytic bone metastasis, centrally necrotic. 2. New hypermetabolic left level 1b and left level 2 neck lymph nodes, indeterminate for reactive versus metastatic nodes. 3. Persistent hypermetabolism associated with a short segment of mid to lower thoracic esophagus with associated circumferential esophageal wall thickening, similar in metabolism and decreased in wall thickness since 11/11/2019 PET-CT, nonspecific, favor post treatment change, with residual neoplasm not excluded. 4. No additional sites of hypermetabolic metastatic disease. 5.  Chronic findings include: Cholelithiasis.   04/27/2020 -  Chemotherapy   First-line with immunotherapy Keytruda q3weeks starting 04/27/20       06/29/2020 -  Chemotherapy   Weekly carbo AUC 1.5 and Taxol 651mm2 on day 1, 8 every 21 days added to first-line Keytruda from C4 on 06/29/20.     07/17/2020 PET scan    IMPRESSION: 1. Left sacral metastasis is slightly enlarged and slightly increased in peripheral hypermetabolism. No additional evidence of metastatic disease. 2. Similar mild hypermetabolism associated with the mid esophagus. No residual hypermetabolism within left neck lymph nodes. 3. Small pericardial effusion. 4. Trace bilateral pleural effusions. 5. Cholelithiasis.     09/14/2020 Procedure   Upper Endoscopy  Impression: - Esophagitis. Biopsied. - Esophageal stenosis. Dilated. Biopsied. this looks most like a mid-distal inflammatory stricture (? post XRT +/- reflux, Candida?) scope passed through but styill felt gerip on scope so did not advance deeper into stomach to avoid trauma. - Normal cardia.    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 - NO EVIDENCE OF DYSPLASIA OR MALIGNANCY. - SEE MICROSCOPIC DESCRIPTION. Microscopic Comment While histologic changes of radiation effects are not apparent in the inflamed granulation tissue, the presence of ulceration can be indicative of radiation effects.      INTERVAL HISTORY:  Jimmy Hawkins here for a follow up of esophageal cancer. He was last seen by me on 10/12/20. He was seen in the infusion area. He reports his urine color is normal. He reports residual pain from his esophageal dilation yesterday.   All other systems were reviewed with the patient  and are negative.  MEDICAL HISTORY:  Past Medical History:  Diagnosis Date   Blood transfusion without reported diagnosis    Hematemesis with nausea 08/10/2019   Squamous cell carcinoma, esophagus ([place]) 04/2019   metastatic    SURGICAL HISTORY: Past Surgical History:  Procedure Laterality Date   BIOPSY  08/11/2019   Procedure: BIOPSY;  Surgeon: [name], MD;  Location[facility] ENDOSCOPY;  Service: Endoscopy;;   BIOPSY  11/28/2019   Procedure: BIOPSY;  Surgeon: [name], MD;  Location: [facility] ENDOSCOPY;  Service: Gastroenterology;;   ESOPHAGOGASTRODUODENOSCOPY  08/11/2019   ESOPHAGOGASTRODUODENOSCOPY (EGD) WITH PROPOFOL N/A 08/11/2019   Procedure: ESOPHAGOGASTRODUODENOSCOPY (EGD) WITH PROPOFOL;  Surgeon: [name], MD;  Location: [facility];  Service: Endoscopy;  Laterality: N/A;   ESOPHAGOGASTRODUODENOSCOPY (EGD) WITH PROPOFOL N/A 11/28/2019   Procedure: ESOPHAGOGASTRODUODENOSCOPY (EGD) WITH PROPOFOL;  Surgeon: Rush Landmark Telford Nab., MD;  Location: [facility];  Service: Gastroenterology;  Laterality: N/A;   EUS N/A 11/28/2019   Procedure: UPPER ENDOSCOPIC ULTRASOUND (EUS) RADIAL;  Surgeon: [name], MD;  Location: [facility];  Service: Gastroenterology;  Laterality: N/A;    I have reviewed the social history and family history with the patient and they are unchanged from previous note.  ALLERGIES:  has No Known Allergies.  MEDICATIONS:  Current Outpatient Medications  Medication Sig Dispense Refill   diphenhydrAMINE-APAP, sleep, (TYLENOL PM EXTRA STRENGTH) 50-1000 MG/30ML LIQD Take 15 mLs by mouth at bedtime as needed (sleep).     famotidine (PEPCID) 20 MG tablet TOME 1 PASTILLA POR VIA ORAL DOS VECES AL DIA 60 tablet 3   Ferrous Sulfate (IRON) 325 (65 Fe) MG TABS Take 1 tablet (325 mg total) by mouth 2 (two) times daily. 60 tablet 3   ferrous sulfate 325 (65 FE) MG tablet TAKE 1 TABLET BY MOUTH 2 TIMES DAILY. 60 tablet 3   gabapentin (NEURONTIN) 300 MG capsule Take 1 capsule (300 mg total) by mouth 3 (three) times daily. 90 capsule 0   HYDROcodone-acetaminophen (HYCET) 7.5-325 mg/15 ml solution Take 10 mLs by mouth every 8 (eight) hours as needed for moderate pain. 473 mL 0   mirtazapine (REMERON) 7.5 MG tablet TOME 1 PASTILLA POR VIA ORAL A LA HORA DE ACOSTARSE 30 tablet 1   morphine (MS CONTIN) 30 MG 12 hr tablet Take 1 tablet (30 mg total) by mouth every 12 (twelve) hours. 60 tablet 0   pantoprazole (PROTONIX) 40 MG tablet  Take 1 tablet (40 mg total) by mouth 2 (two) times daily before a meal. 60 tablet 3   prochlorperazine (COMPAZINE) 10 MG tablet Take 1 tablet by mouth every 6 hours as needed for nausea or vomiting. 60 tablet 2   sucralfate (CARAFATE) 1 g tablet Take 1 tablet by mouth 4 times daily. (Dissolve in 15 mls of water before taking) 120 tablet 2   No current facility-administered medications for this visit.   Facility-Administered Medications Ordered in Other Visits  Medication Dose Route Frequency Provider Last Rate Last Admin   0.9 %  sodium chloride infusion   Intravenous Once PRN [name], MD   Stopped at 11/02/20 1510   diphenhydrAMINE (BENADRYL) 50 MG/ML injection            diphenhydrAMINE (BENADRYL) 50 MG/ML injection            famotidine (PEPCID) 20 mg IVPB            famotidine (PEPCID) 20 mg IVPB  loratadine (CLARITIN) 10 MG tablet            loratadine (CLARITIN) tablet 10 mg  10 mg Oral Daily [name], MD   10 mg at 11/02/20 1439   palonosetron (ALOXI) 0.25 MG/5ML injection            palonosetron (ALOXI) 0.25 MG/5ML injection             PHYSICAL EXAMINATION: ECOG PERFORMANCE STATUS: 2 - Symptomatic, <50% confined to bed  There were no vitals filed for this visit. Wt Readings from Last 3 Encounters:  11/02/20 152 lb 4 oz (69.1 kg)  10/31/20 153 lb (69.4 kg)  10/19/20 154 lb 12 oz (70.2 kg)     GENERAL:alert, no distress and comfortable SKIN: skin color normal, no rashes or significant lesions EYES: normal, Conjunctiva are pink and non-injected, sclera clear  NEURO: alert & oriented x 3 with fluent speech  LABORATORY DATA:  I have reviewed the data as listed CBC Latest Ref Rng & Units 11/02/2020 10/19/2020 10/12/2020  WBC 4.0 - 10.5 K/uL 4.6 3.6(L) 2.6(L)  Hemoglobin 13.0 - 17.0 g/dL 11.7(L) 11.3(L) 12.1(L)  Hematocrit 39.0 - 52.0 % 34.5(L) 32.1(L) 35.6(L)  Platelets 150 - 400 K/uL 215 151 160     CMP Latest Ref Rng & Units 11/02/2020 10/19/2020 10/12/2020   Glucose 70 - 99 mg/dL 111(H) 104(H) 110(H)  BUN 6 - 20 mg/dL 5(L) 4(L) 5(L)  Creatinine 0.61 - 1.24 mg/dL 0.65 0.63 0.62  Sodium 135 - 145 mmol/L 136 138 138  Potassium 3.5 - 5.1 mmol/L 3.5 3.6 3.7  Chloride 98 - 111 mmol/L 104 104 104  CO2 22 - 32 mmol/L 21(L) 21(L) 24  Calcium 8.9 - 10.3 mg/dL 9.6 9.4 9.4  Total Protein 6.5 - 8.1 g/dL 7.8 7.4 7.4  Total Bilirubin 0.3 - 1.2 mg/dL 0.6 0.4 0.7  Alkaline Phos 38 - 126 U/L 78 76 78  AST 15 - 41 U/L '21 20 18  ' ALT 0 - 44 U/L '11 9 8      ' RADIOGRAPHIC STUDIES: I have personally reviewed the radiological images as listed and agreed with the findings in the report. No results found.    No orders of the defined types were placed in this encounter.  All questions were answered. The patient knows to call the clinic with any problems, questions or concerns. No barriers to learning was detected. The total time spent in the appointment was 30 minutes.     [name], MD 11/02/2020   I, Wilburn Mylar, am acting as scribe for [name], MD.   I have reviewed the above documentation for accuracy and completeness, and I agree with the above.

## 2020-11-02 NOTE — Patient Instructions (Signed)
[facility]  Discharge Instructions: Thank you for choosing [facility] to provide your oncology and hematology care.   If you have a lab appointment with the [facility], please go directly to the [facility] and check in at the registration area.   Wear comfortable clothing and clothing appropriate for easy access to any Portacath or PICC line.   We strive to give you quality time with your provider. You may need to reschedule your appointment if you arrive late (15 or more minutes).  Arriving late affects you and other patients whose appointments are after yours.  Also, if you miss three or more appointments without notifying the office, you may be dismissed from the clinic at the provider's discretion.      For prescription refill requests, have your pharmacy contact our office and allow 72 hours for refills to be completed.    Today you received the following chemotherapy and/or immunotherapy agents Carboplatin, Taxol, and Keytruda      To help prevent nausea and vomiting after your treatment, we encourage you to take your nausea medication as directed.  BELOW ARE SYMPTOMS THAT SHOULD BE REPORTED IMMEDIATELY: *FEVER GREATER THAN 100.4 F (38 C) OR HIGHER *CHILLS OR SWEATING *NAUSEA AND VOMITING THAT IS NOT CONTROLLED WITH YOUR NAUSEA MEDICATION *UNUSUAL SHORTNESS OF BREATH *UNUSUAL BRUISING OR BLEEDING *URINARY PROBLEMS (pain or burning when urinating, or frequent urination) *BOWEL PROBLEMS (unusual diarrhea, constipation, pain near the anus) TENDERNESS IN MOUTH AND THROAT WITH OR WITHOUT PRESENCE OF ULCERS (sore throat, sores in mouth, or a toothache) UNUSUAL RASH, SWELLING OR PAIN  UNUSUAL VAGINAL DISCHARGE OR ITCHING   Items with * indicate a potential emergency and should be followed up as soon as possible or go to the Emergency Department if any problems should occur.  Please show the CHEMOTHERAPY ALERT CARD or IMMUNOTHERAPY  ALERT CARD at check-in to the Emergency Department and triage nurse.  Should you have questions after your visit or need to cancel or reschedule your appointment, please contact [facility]  Dept: [phone]  and follow the prompts.  Office hours are 8:00 a.m. to 4:30 p.m. Monday - Friday. Please note that voicemails left after 4:00 p.m. may not be returned until the following business day.  We are closed weekends and major holidays. You have access to a nurse at all times for urgent questions. Please call the main number to the clinic Dept: [phone] and follow the prompts.   For any non-urgent questions, you may also contact your provider using MyChart. We now offer e-Visits for anyone 56 and older to request care online for non-urgent symptoms. For details visit [email or web address].   Also download the MyChart app! Go to the app store, search "MyChart", open the app, select [facility], and log in with your MyChart username and password.  Due to Covid, a mask is required upon entering the hospital/clinic. If you do not have a mask, one will be given to you upon arrival. For doctor visits, patients may have 1 support person aged 54 or older with them. For treatment visits, patients cannot have anyone with them due to current Covid guidelines and our immunocompromised population.

## 2020-11-02 NOTE — Telephone Encounter (Signed)
Scheduled follow-up appointment per 10/7 los. Patient's niece is aware.

## 2020-11-02 NOTE — Progress Notes (Signed)
Md aware of HR.   1432  PT c/o itching on upper abd and chin.  No rash noted; on redness. Carboplatin stopped.   94/69; HR 88; 1005 on room air; RR 18.   Dr. Burr Medico notified.  MD ordered to give claritin.  Wait 15 mins.  Resume Carboplatin after 15 mins at 1/2 rate.  May increase to full rate after 15 mins if pt tolerates.  1440 claritin given as ordered.  1451 VS 100/67; 78 Hr. RR 18; 100% room air.  Pt reports itching resolved.  Redness almost gone.   1445 Carboplatin restarted at 122 ml/ hr per MD.  1510 VS 96/66; Hr 82; RR 16; 100% on room air.  Pt denies any s/s of a reaction.  Redness on abd and chin resolved.  Pt denies being itchy.   Carboplatin resume to original rate of 244 ml/ hr.  1518.  VS 99% Room air; HR 75, RR 16; 94/65.  Pt denies any s/s of a reaction.   1520  Pt denies any s/s of a reaction.  Translator on the phone.  Pt verbalizes "everything is ok".   1547.  Treatment completed.  Pt denies any s/s of  a reaction.  Pt left the unit ambulatory using a walker.

## 2020-11-02 NOTE — Telephone Encounter (Signed)
  Follow up Call-  Call back number 10/31/2020 10/03/2020 09/19/2020 09/14/2020  Post procedure Call Back phone  # [phone]  Phone comments phone # for [place] -niece - - -  Permission to leave phone message Yes - Yes Yes  Some recent data might be hidden     Patient questions:  Do you have a fever, pain , or abdominal swelling? No. Pain Score  0 *  Have you tolerated food without any problems? Yes.    Have you been able to return to your normal activities? Yes.    Do you have any questions about your discharge instructions: Diet   No. Medications  No. Follow up visit  No.  Do you have questions or concerns about your Care? No.  Actions: * If pain score is 4 or above: No action needed, pain <4.  Have you developed a fever since your procedure? no  2.   Have you had an respiratory symptoms (SOB or cough) since your procedure? no  3.   Have you tested positive for COVID 19 since your procedure no  4.   Have you had any family members/close contacts diagnosed with the COVID 19 since your procedure?  no   If yes to any of these questions please route to Joylene John, RN and Joella Prince, RN

## 2020-11-08 ENCOUNTER — Ambulatory Visit (HOSPITAL_COMMUNITY)
Admission: RE | Admit: 2020-11-08 | Discharge: 2020-11-08 | Disposition: A | Discharge status: Home / Self Care | Payer: Self-pay | Source: Ambulatory Visit | Attending: Hematology | Admitting: Hematology

## 2020-11-08 ENCOUNTER — Other Ambulatory Visit: Payer: Self-pay | Admitting: Hematology

## 2020-11-08 ENCOUNTER — Encounter: Payer: Self-pay | Admitting: Hematology

## 2020-11-08 ENCOUNTER — Encounter: Payer: Self-pay | Admitting: Physician Assistant

## 2020-11-08 ENCOUNTER — Other Ambulatory Visit: Payer: Self-pay

## 2020-11-08 ENCOUNTER — Other Ambulatory Visit (HOSPITAL_COMMUNITY): Payer: Self-pay

## 2020-11-08 DIAGNOSIS — C154 Malignant neoplasm of middle third of esophagus: Secondary | ICD-10-CM | POA: Insufficient documentation

## 2020-11-08 DIAGNOSIS — I3139 Other pericardial effusion (noninflammatory): Secondary | ICD-10-CM | POA: Insufficient documentation

## 2020-11-08 DIAGNOSIS — K920 Hematemesis: Secondary | ICD-10-CM

## 2020-11-08 LAB — GLUCOSE, CAPILLARY: Glucose-Capillary: 104 mg/dL — ABNORMAL HIGH (ref 70–99)

## 2020-11-08 MED ORDER — PROCHLORPERAZINE MALEATE 10 MG PO TABS
10.0000 mg | ORAL_TABLET | Freq: Four times a day (QID) | ORAL | 2 refills | Status: DC | PRN
  Filled 2020-11-08: qty 60, 15d supply, fill #0

## 2020-11-08 MED ORDER — FLUDEOXYGLUCOSE F - 18 (FDG) INJECTION
7.6000 | Freq: Once | INTRAVENOUS | Status: AC | PRN
  Administered 2020-11-08: 8.2 via INTRAVENOUS

## 2020-11-09 ENCOUNTER — Inpatient Hospital Stay (HOSPITAL_BASED_OUTPATIENT_CLINIC_OR_DEPARTMENT_OTHER): Payer: Self-pay | Admitting: Hematology

## 2020-11-09 ENCOUNTER — Inpatient Hospital Stay: Payer: Self-pay

## 2020-11-09 ENCOUNTER — Other Ambulatory Visit (HOSPITAL_COMMUNITY): Payer: Self-pay

## 2020-11-09 ENCOUNTER — Encounter: Payer: Self-pay | Admitting: Hematology

## 2020-11-09 ENCOUNTER — Telehealth: Payer: Self-pay | Admitting: Hematology

## 2020-11-09 ENCOUNTER — Other Ambulatory Visit: Payer: Self-pay

## 2020-11-09 ENCOUNTER — Encounter: Payer: Self-pay | Admitting: Physician Assistant

## 2020-11-09 VITALS — BP 126/85 | HR 105 | Temp 98.7°F | Resp 18 | Ht 67.0 in | Wt 150.5 lb

## 2020-11-09 DIAGNOSIS — Z66 Do not resuscitate: Secondary | ICD-10-CM

## 2020-11-09 DIAGNOSIS — C154 Malignant neoplasm of middle third of esophagus: Secondary | ICD-10-CM

## 2020-11-09 DIAGNOSIS — C7951 Secondary malignant neoplasm of bone: Secondary | ICD-10-CM

## 2020-11-09 LAB — CBC WITH DIFFERENTIAL/PLATELET
Abs Immature Granulocytes: 0.06 10*3/uL (ref 0.00–0.07)
Basophils Absolute: 0 10*3/uL (ref 0.0–0.1)
Basophils Relative: 1 %
Eosinophils Absolute: 0 10*3/uL (ref 0.0–0.5)
Eosinophils Relative: 2 %
HCT: 32.8 % — ABNORMAL LOW (ref 39.0–52.0)
Hemoglobin: 11.6 g/dL — ABNORMAL LOW (ref 13.0–17.0)
Immature Granulocytes: 2 %
Lymphocytes Relative: 28 %
Lymphs Abs: 0.7 10*3/uL (ref 0.7–4.0)
MCH: 33.5 pg (ref 26.0–34.0)
MCHC: 35.4 g/dL (ref 30.0–36.0)
MCV: 94.8 fL (ref 80.0–100.0)
Monocytes Absolute: 0.4 10*3/uL (ref 0.1–1.0)
Monocytes Relative: 17 %
Neutro Abs: 1.3 10*3/uL — ABNORMAL LOW (ref 1.7–7.7)
Neutrophils Relative %: 50 %
Platelets: 220 10*3/uL (ref 150–400)
RBC: 3.46 MIL/uL — ABNORMAL LOW (ref 4.22–5.81)
RDW: 13.3 % (ref 11.5–15.5)
WBC: 2.5 10*3/uL — ABNORMAL LOW (ref 4.0–10.5)
nRBC: 0 % (ref 0.0–0.2)

## 2020-11-09 LAB — CMP (CANCER CENTER ONLY)
ALT: 15 U/L (ref 0–44)
AST: 32 U/L (ref 15–41)
Albumin: 4.1 g/dL (ref 3.5–5.0)
Alkaline Phosphatase: 77 U/L (ref 38–126)
Anion gap: 11 (ref 5–15)
BUN: <5 mg/dL — ABNORMAL LOW (ref 6–20)
CO2: 24 mmol/L (ref 22–32)
Calcium: 9.1 mg/dL (ref 8.9–10.3)
Chloride: 102 mmol/L (ref 98–111)
Creatinine: 0.37 mg/dL — ABNORMAL LOW (ref 0.61–1.24)
GFR, Estimated: >60 mL/min (ref >60)
Glucose, Bld: 97 mg/dL (ref 70–99)
Potassium: 3.7 mmol/L (ref 3.5–5.1)
Sodium: 137 mmol/L (ref 135–145)
Total Bilirubin: 0.4 mg/dL (ref 0.3–1.2)
Total Protein: 8.2 g/dL — ABNORMAL HIGH (ref 6.5–8.1)

## 2020-11-09 MED ORDER — SODIUM CHLORIDE 0.9 % IV SOLN
60.0000 mg/m2 | Freq: Once | INTRAVENOUS | Status: AC
  Administered 2020-11-09: 114 mg via INTRAVENOUS
  Filled 2020-11-09: qty 19

## 2020-11-09 MED ORDER — MORPHINE SULFATE ER 30 MG PO TBCR
30.0000 mg | EXTENDED_RELEASE_TABLET | Freq: Three times a day (TID) | ORAL | 0 refills | Status: DC
Start: 2020-11-09 — End: 2020-12-12
  Filled 2020-11-09: qty 90, 30d supply, fill #0

## 2020-11-09 MED ORDER — SODIUM CHLORIDE 0.9 % IV SOLN
Freq: Once | INTRAVENOUS | Status: AC
Start: 2020-11-09 — End: 2020-11-09

## 2020-11-09 MED ORDER — OXYCODONE HCL 5 MG/5ML PO SOLN
5.0000 mg | Freq: Four times a day (QID) | ORAL | 0 refills | Status: DC | PRN
  Filled 2020-11-09 – 2020-11-12 (×2): qty 473, 24d supply, fill #0

## 2020-11-09 MED ORDER — DIPHENHYDRAMINE HCL 50 MG/ML IJ SOLN
50.0000 mg | Freq: Once | INTRAMUSCULAR | Status: AC
  Administered 2020-11-09: 50 mg via INTRAVENOUS
  Filled 2020-11-09: qty 1

## 2020-11-09 MED ORDER — FAMOTIDINE 20 MG IN NS 100 ML IVPB
20.0000 mg | Freq: Once | INTRAVENOUS | Status: AC
  Administered 2020-11-09: 20 mg via INTRAVENOUS
  Filled 2020-11-09: qty 100

## 2020-11-09 MED ORDER — PALONOSETRON HCL INJECTION 0.25 MG/5ML
0.2500 mg | Freq: Once | INTRAVENOUS | Status: AC
  Administered 2020-11-09: 0.25 mg via INTRAVENOUS
  Filled 2020-11-09: qty 5

## 2020-11-09 MED ORDER — SODIUM CHLORIDE 0.9 % IV SOLN
220.0000 mg | Freq: Once | INTRAVENOUS | Status: AC
  Administered 2020-11-09: 220 mg via INTRAVENOUS
  Filled 2020-11-09: qty 22

## 2020-11-09 MED ORDER — MIRTAZAPINE 15 MG PO TABS
15.0000 mg | ORAL_TABLET | Freq: Every day | ORAL | 2 refills | Status: DC
  Filled 2020-11-09: qty 30, 30d supply, fill #0

## 2020-11-09 NOTE — Progress Notes (Signed)
[place]   [phone] [phone]) [phone]   Clinic Follow up Note   Patient Care Team: [name], MD as PCP - General (Hematology) [name], MD as Consulting Physician (Oncology)  Date of Service:  11/09/2020  CHIEF COMPLAINT: f/u of esophageal cancer  CURRENT THERAPY:  First-line Keytruda q3weeks starting 04/27/20, added weekly carbo AUC 1.5 and Taxol 87m/m2 from cycle 4 (06/29/20) on day 1, 8 every 21 days   ASSESSMENT & PLAN:  Jimmy Blaggis a 45y.o. male with   1. 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 08/11/19 CT CAP found him to have mid esophageal mass and enlarged AP window node, no distant metastasis. -He completed 6 weeks of concurrent chemoRT with weekly Carboplatin and Taxol 08/29/19-10/06/19 -Repeat PET scan October 2021 showed hypermetabolic wall thickening of the distal half of the thoracic esophageus, no notable distant metastasis.  -He received CAPOX q3weeks with Xeloda 15070mBID 2 weeks on/1 week off 12/20/19-02/2020. -He completed palliative Radiation to sacrum with Dr MoLisbeth Renshaw/28/22-3/15/22.  -He has started First-line single agent Keytruda 04/27/20. Due to his slow improvement and squamous cell histology, we added chemo with carboplatin and Taxol 2 weeks on/1 week off from C4 on 06/29/20. He receives zarxio as needed for neutropenia. -restaging PET 11/08/20 showed worsening diease to the left sacral lesion and mild increase in activity to esophageal mass. No new metastasis. I reviewed the images and results with them today. -I again discussed that his cancer is not curable, but goal of treatment is palliative to prolong his life and improve his QOL. I discussed that we can other chemo options,but response rate will be lower.  I discussed the option of single agent docetaxel,  irinotecan, or FOLFIRI.  I advised that there is a balance of treatment and quality of life. He would like to continue to treat at this time. He will proceed with his scheduled treatment today, and we will switch his treatment to docetaxel in 2 and 5 weeks. -Potential side effect of docetaxel discussed with him in detail, he agrees to proceed.   2. Sacral/back pain, LE weakness, secondary to bone metastasis -He was seen to have bone mets on 04/11/20 PET scan. -He completed palliative Radiation to sacrum with Dr MoLisbeth Renshaw/28/22-3/15/22. He was given short course dexa 109m8m -restaging PET 11/08/20 showed continued worsening to left sacral metastatic lesion with erosion across SI joint into left iliac bone, left sacrum, and sacral spinal canal. -He is currently taking morphine SR q8hr and hydrocodone 5cc TID. He was also using a previously-prescribed bottle of liquid oxycodone, which he is now out of. I advised him to increase the hydrocodone to 10 cc. I refilled the MS contin, and I will call in the oxycodone for him to use after he finishes the hydrocodone.  3. Goal of care discussion  -We again discussed the incurable nature of his cancer, and the overall poor prognosis, especially if he does not have good response to chemotherapy or progress on chemo -The patient understands the goal of care is palliative. -I recommend DNR/DNI, he agrees today (11/09/20)  4. H/o Heavy alcohol user -He quit drinking since cancer diagnosis, but started again. Due to weakness, he is not able to drink as much since 04/13/20 and stopped overall 06/29/20 -on 10/12/20, he reported he is back to drinking 5 beers a day. I  previously advised him to abstain for the day of and a few following chemo.   5. Social and Financial Support  -Patient does not have insurance and is not currently working. He has already applied for Medicaid. -He has met with financial advocate Shauna and SW and he has grant assistance with medications.    6. Dysphagia and weight loss -He continues to have difficulty swallowing, and as a result has lost weight. -he is undergoing esophageal dilation under Dr. Carlean Purl, most recently 10/30/20. -he reports increased appetite on mirtazapine. I refilled for him today. -f/u with dietician    7.  Goal of care discussion, DNR  -We again discussed the incurable nature of his cancer, and the overall poor prognosis, especially if he does not have good response to chemotherapy or progress on chemo -The patient understands the goal of care is palliative. -We discussed code status today. I recommend DNR/DNI, he agreed. Will document     PLAN: -proceed with C12D8 [name] and taxol today -Lab, follow-up and first cycle docetaxel in 2 weeks   No problem-specific Assessment & Plan notes found for this encounter.   SUMMARY OF ONCOLOGIC HISTORY: Oncology History Overview Note  Cancer Staging Malignant neoplasm of middle third of esophagus ([place]) Staging form: Esophagus - Squamous Cell Carcinoma, AJCC 8th Edition - Clinical: Stage Unknown (cTX, cN1, cM0) - Signed by [name], MD on 08/18/2019    Malignant neoplasm of middle third of esophagus [facility])   Initial Diagnosis   Malignant neoplasm of middle third of esophagus ([place])   08/11/2019 Imaging   CT CAP W contrast 08/11/19 IMPRESSION: 1. Mid to lower esophageal mass along an 8 cm segment, large endoluminal component, strongly favoring esophageal malignancy. Borderline enlarged AP window lymph node. No findings of distant metastatic disease. 2. Trace bilateral pleural effusions. 3. Diffuse hepatic steatosis. 4. Cholelithiasis. 5. Fatty spermatic cords likely due to small bilateral indirect inguinal hernias.   08/11/2019 Procedure   EGD by Dr Carlean Purl  IMPRESSION - Partially obstructing, likely malignant esophageal tumor was found in the upper third of the esophagus and in the middle third of the esophagus. Biopsied. Very large and long -  difficult but able to advance scope by this lesion - await CT for better length estimate - Normal stomach. - Normal examined duodenum.   08/11/2019 Initial Biopsy   FINAL MICROSCOPIC DIAGNOSIS:   A. ESOPHAGUS, UPPER, BIOPSY:  - Squamous cell carcinoma.    08/11/2019 Genetic Testing   PD-L1 Expression 90%   08/18/2019 Cancer Staging   Staging form: Esophagus - Squamous Cell Carcinoma, AJCC 8th Edition - Clinical: Stage Unknown (cTX, cN1, cM0) - Signed by [name], MD on 08/18/2019   08/29/2019 - 10/04/2019 Chemotherapy   Concurrent chemoradiation with weekly CT for 6 weeks starting 08/29/19-10/04/19    08/29/2019 - 10/06/2019 Radiation Therapy   Concurrent chemoradiation with Dr Lisbeth Renshaw starting 08/29/19-10/06/19   11/11/2019 PET scan   IMPRESSION: 1. Again seen is a long segment of FDG avid wall thickening of the distal half of the thoracic esophagus compatible with known esophageal neoplasm. 2. No signs of FDG avid nodal or distant metastatic disease.     11/28/2019 Procedure   EUS by Dr Rush Landmark IMPRESSION EGD Impression: - No gross lesions in esophagus proximally. - Previously noted esophageal cancer is not present. However, in its place and extending distal to this is likely LA Grade D chemotherapy/radiation esophagitis with bleeding. Biopsied after the EUS was complete to evaluate for persistent disease or not. -  Z-line regular, 37 cm from the incisors. - 3 cm hiatal hernia. - Erythematous mucosa in the stomach. No other gross lesions in the stomach. Biopsied. - No gross lesions in the duodenal bulb, in the first portion of the duodenum and in the second portion of the duodenum. EUS Impression: - Wall thickening was seen in the thoracic esophagus into the gastroesophageal junction. The thickening appeared to primarily be within the luminal interface/superficial mucosa (Layer 1) and deep mucosa (Layer 2). The previously noted esophageal cancer has been replaced by what appears to be  radiation/chemotherapy induced changes. With the limitations of having to use the mini-probe EUS, I could not visualize persistent significant disease other than the entire distal area of the esophagus having likely changes noted as above. - No malignant-appearing lymph nodes were visualized in the middle paraesophageal mediastinum (level 32M), lower paraesophageal mediastinum (level 8L), diaphragmatic region (level 15) and paracardial region (level 16).      FINAL MICROSCOPIC DIAGNOSIS:   A. STOMACH, BIOPSY:  - Mildly active chronic gastritis.  - Warthin-Starry negative for Helicobacter pylori.  - No intestinal metaplasia, dysplasia or carcinoma.   B. ESOPHAGUS, BIOPSY:  - Inflamed granulation tissue and exudate consistent with ulcer.  - No malignancy identified.    12/20/2019 - 02/2020 Chemotherapy   CAPOX q3weeks with Xeloda 1558m BID 2 weeks on/1 week off starting 12/20/19-02/2020   03/21/2020 Imaging   [facility]--Circumferential thickening of the lower third of the esophagus which corresponds with known esophageal malignancy. No evidence of metastatic disease within the chest.  --For findings below the diaphragm, please see concurrent separately reported CT of the abdomen and pelvis.  --Expansile lytic mass with associated soft tissue component centered in the left sacral ala and abutting the left S1 and S2 nerve roots, left internal iliac vasculature, and left pyriformis muscle with no other metastatic disease within the abdomen or pelvis.   --Diffuse thickening of the distal esophagus compatible with known esophageal malignancy.   --Cholelithiasis with nonspecific gallbladder distention. Clinical correlation is recommended. If there is clinical concern for cholecystitis, further evaluation with ultrasound can be obtained.   03/21/2020 Imaging   CT Lumbar Spine  Expansile lytic lesion centered in the left sacral ala with no other evidence of metastatic disease in the  lumbar spine.   03/26/2020 - 04/10/2020 Radiation Therapy   Palliative Radiation to Sacrum with Dr MLisbeth Renshaw2/28/22-3/15/22.    04/11/2020 Progression   PET  IMPRESSION: 1. New hypermetabolic expansile 6.1 cm upper left sacral lytic bone metastasis, centrally necrotic. 2. New hypermetabolic left level 1b and left level 2 neck lymph nodes, indeterminate for reactive versus metastatic nodes. 3. Persistent hypermetabolism associated with a short segment of mid to lower thoracic esophagus with associated circumferential esophageal wall thickening, similar in metabolism and decreased in wall thickness since 11/11/2019 PET-CT, nonspecific, favor post treatment change, with residual neoplasm not excluded. 4. No additional sites of hypermetabolic metastatic disease. 5.  Chronic findings include: Cholelithiasis.   04/27/2020 -  Chemotherapy   First-line with immunotherapy Keytruda q3weeks starting 04/27/20       06/29/2020 -  Chemotherapy   Weekly carbo AUC 1.5 and Taxol 637mm2 on day 1, 8 every 21 days added to first-line Keytruda from C4 on 06/29/20.     07/17/2020 PET scan   IMPRESSION: 1. Left sacral metastasis is slightly enlarged and slightly increased in peripheral hypermetabolism. No additional evidence of metastatic disease. 2. Similar mild hypermetabolism associated with the mid esophagus. No residual  hypermetabolism within left neck lymph nodes. 3. Small pericardial effusion. 4. Trace bilateral pleural effusions. 5. Cholelithiasis.     09/14/2020 Procedure   Upper Endoscopy  Impression: - Esophagitis. Biopsied. - Esophageal stenosis. Dilated. Biopsied. this looks most like a mid-distal inflammatory stricture (? post XRT +/- reflux, Candida?) scope passed through but styill felt gerip on scope so did not advance deeper into stomach to avoid trauma. - Normal cardia.    09/14/2020 Pathology Results   Diagnosis Surgical [P], esophjageal stricture - INFLAMED GRANULATION TISSUE AND  EXUDATE. - NO EVIDENCE OF DYSPLASIA OR MALIGNANCY. - SEE MICROSCOPIC DESCRIPTION. Microscopic Comment While histologic changes of radiation effects are not apparent in the inflamed granulation tissue, the presence of ulceration can be indicative of radiation effects.      INTERVAL HISTORY:  Jimmy Hawkins is here for a follow up of esophageal cancer. He was last seen by me on 11/02/20. He presents to the clinic accompanied by his wife and an interpreter. He reports worsening pain to both sides of the low back and the left hip down the leg. His wife brought in a bottle of oxycodone they had from March. She notes this was initially too strong, but since his pain has worsened, he used what he had left. He reports he is taking 5 mL of hycet and MS contin every 8 hours. He reports the esophageal dilation he underwent on 10/4 has helped his swallowing. He notes, however, that his appetite is low, and they are requesting a refill of his mirtazapine. He feels this was helpful.   All other systems were reviewed with the patient and are negative.  MEDICAL HISTORY:  Past Medical History:  Diagnosis Date   Blood transfusion without reported diagnosis    Hematemesis with nausea 08/10/2019   Squamous cell carcinoma, esophagus ([place]) 04/2019   metastatic    SURGICAL HISTORY: Past Surgical History:  Procedure Laterality Date   BIOPSY  08/11/2019   Procedure: BIOPSY;  Surgeon: [name], MD;  Location: [facility] ENDOSCOPY;  Service: Endoscopy;;   BIOPSY  11/28/2019   Procedure: BIOPSY;  Surgeon: [name], MD;  Location: [facility] ENDOSCOPY;  Service: Gastroenterology;;   ESOPHAGOGASTRODUODENOSCOPY  08/11/2019   ESOPHAGOGASTRODUODENOSCOPY (EGD) WITH PROPOFOL N/A 08/11/2019   Procedure: ESOPHAGOGASTRODUODENOSCOPY (EGD) WITH PROPOFOL;  Surgeon: [name], MD;  Location: [place];  Service: Endoscopy;  Laterality: N/A;   ESOPHAGOGASTRODUODENOSCOPY (EGD) WITH PROPOFOL N/A  11/28/2019   Procedure: ESOPHAGOGASTRODUODENOSCOPY (EGD) WITH PROPOFOL;  Surgeon: Rush Landmark Telford Nab., MD;  Location: [place];  Service: Gastroenterology;  Laterality: N/A;   EUS N/A 11/28/2019   Procedure: UPPER ENDOSCOPIC ULTRASOUND (EUS) RADIAL;  Surgeon: [name], MD;  Location: [place];  Service: Gastroenterology;  Laterality: N/A;    I have reviewed the social history and family history with the patient and they are unchanged from previous note.  ALLERGIES:  has No Known Allergies.  MEDICATIONS:  Current Outpatient Medications  Medication Sig Dispense Refill   diphenhydrAMINE-APAP, sleep, (TYLENOL PM EXTRA STRENGTH) 50-1000 MG/30ML LIQD Take 15 mLs by mouth at bedtime as needed (sleep).     famotidine (PEPCID) 20 MG tablet TOME 1 PASTILLA POR VIA ORAL DOS VECES AL DIA 60 tablet 3   Ferrous Sulfate (IRON) 325 (65 Fe) MG TABS Take 1 tablet (325 mg total) by mouth 2 (two) times daily. 60 tablet 3   ferrous sulfate 325 (65 FE) MG tablet TAKE 1 TABLET BY MOUTH 2 TIMES DAILY. 60 tablet 3  gabapentin (NEURONTIN) 300 MG capsule Take 1 capsule (300 mg total) by mouth 3 (three) times daily. 90 capsule 0   HYDROcodone-acetaminophen (HYCET) 7.5-325 mg/15 ml solution Take 10 mLs by mouth every 8 (eight) hours as needed for moderate pain. 473 mL 0   mirtazapine (REMERON) 7.5 MG tablet TOME 1 PASTILLA POR VIA ORAL A LA HORA DE ACOSTARSE 30 tablet 1   morphine (MS CONTIN) 30 MG 12 hr tablet Take 1 tablet (30 mg total) by mouth every 12 (twelve) hours. 60 tablet 0   pantoprazole (PROTONIX) 40 MG tablet Take 1 tablet (40 mg total) by mouth 2 (two) times daily before a meal. 60 tablet 3   prochlorperazine (COMPAZINE) 10 MG tablet Take 1 tablet by mouth every 6 hours as needed for nausea or vomiting. 60 tablet 2   sucralfate (CARAFATE) 1 g tablet Take 1 tablet by mouth 4 times daily. (Dissolve in 15 mls of water before taking) 120 tablet 2   No current facility-administered  medications for this visit.   Facility-Administered Medications Ordered in Other Visits  Medication Dose Route Frequency Provider Last Rate Last Admin   diphenhydrAMINE (BENADRYL) 50 MG/ML injection            diphenhydrAMINE (BENADRYL) 50 MG/ML injection            famotidine (PEPCID) 20 mg IVPB            famotidine (PEPCID) 20 mg IVPB            palonosetron (ALOXI) 0.25 MG/5ML injection            palonosetron (ALOXI) 0.25 MG/5ML injection             PHYSICAL EXAMINATION: ECOG PERFORMANCE STATUS: 2 - Symptomatic, <50% confined to bed  There were no vitals filed for this visit. Wt Readings from Last 3 Encounters:  11/02/20 152 lb 4 oz (69.1 kg)  10/31/20 153 lb (69.4 kg)  10/19/20 154 lb 12 oz (70.2 kg)     GENERAL:alert, no distress and comfortable SKIN: skin color normal, no rashes or significant lesions EYES: normal, Conjunctiva are pink and non-injected, sclera clear  NEURO: alert & oriented x 3 with fluent speech  LABORATORY DATA:  I have reviewed the data as listed CBC Latest Ref Rng & Units 11/02/2020 10/19/2020 10/12/2020  WBC 4.0 - 10.5 K/uL 4.6 3.6(L) 2.6(L)  Hemoglobin 13.0 - 17.0 g/dL 11.7(L) 11.3(L) 12.1(L)  Hematocrit 39.0 - 52.0 % 34.5(L) 32.1(L) 35.6(L)  Platelets 150 - 400 K/uL 215 151 160     CMP Latest Ref Rng & Units 11/02/2020 10/19/2020 10/12/2020  Glucose 70 - 99 mg/dL 111(H) 104(H) 110(H)  BUN 6 - 20 mg/dL 5(L) 4(L) 5(L)  Creatinine 0.61 - 1.24 mg/dL 0.65 0.63 0.62  Sodium 135 - 145 mmol/L 136 138 138  Potassium 3.5 - 5.1 mmol/L 3.5 3.6 3.7  Chloride 98 - 111 mmol/L 104 104 104  CO2 22 - 32 mmol/L 21(L) 21(L) 24  Calcium 8.9 - 10.3 mg/dL 9.6 9.4 9.4  Total Protein 6.5 - 8.1 g/dL 7.8 7.4 7.4  Total Bilirubin 0.3 - 1.2 mg/dL 0.6 0.4 0.7  Alkaline Phos 38 - 126 U/L 78 76 78  AST 15 - 41 U/L _0 ALT 0 - 44 U/L _1 RADIOGRAPHIC STUDIES: I have personally reviewed the radiological images as listed and agreed with the findings in  the report. No results found.    No  orders of the defined types were placed in this encounter.  All questions were answered. The patient knows to call the clinic with any problems, questions or concerns. No barriers to learning was detected. The total time spent in the appointment was 40 minutes.     [name], MD 11/09/2020   I, Wilburn Mylar, am acting as scribe for [name], MD.   I have reviewed the above documentation for accuracy and completeness, and I agree with the above.

## 2020-11-09 NOTE — Telephone Encounter (Signed)
Scheduled appointment per 10/14 sch msg. Patient is aware.  

## 2020-11-09 NOTE — Progress Notes (Signed)
Per Dr. Burr Medico, ok to treat with ANC 1.3 and elevated heart rate.

## 2020-11-09 NOTE — Patient Instructions (Signed)
[facility] MEDICAL ONCOLOGY  Discharge Instructions: Thank you for choosing [facility] to provide your oncology and hematology care.   If you have a lab appointment with the Cancer Center, please go directly to the Cancer Center and check in at the registration area.   Wear comfortable clothing and clothing appropriate for easy access to any Portacath or PICC line.   We strive to give you quality time with your provider. You may need to reschedule your appointment if you arrive late (15 or more minutes).  Arriving late affects you and other patients whose appointments are after yours.  Also, if you miss three or more appointments without notifying the office, you may be dismissed from the clinic at the provider's discretion.      For prescription refill requests, have your pharmacy contact our office and allow 72 hours for refills to be completed.    Today you received the following chemotherapy and/or immunotherapy agents: Taxol & Carboplatin   To help prevent nausea and vomiting after your treatment, we encourage you to take your nausea medication as directed.  BELOW ARE SYMPTOMS THAT SHOULD BE REPORTED IMMEDIATELY: *FEVER GREATER THAN 100.4 F (38 C) OR HIGHER *CHILLS OR SWEATING *NAUSEA AND VOMITING THAT IS NOT CONTROLLED WITH YOUR NAUSEA MEDICATION *UNUSUAL SHORTNESS OF BREATH *UNUSUAL BRUISING OR BLEEDING *URINARY PROBLEMS (pain or burning when urinating, or frequent urination) *BOWEL PROBLEMS (unusual diarrhea, constipation, pain near the anus) TENDERNESS IN MOUTH AND THROAT WITH OR WITHOUT PRESENCE OF ULCERS (sore throat, sores in mouth, or a toothache) UNUSUAL RASH, SWELLING OR PAIN  UNUSUAL VAGINAL DISCHARGE OR ITCHING   Items with * indicate a potential emergency and should be followed up as soon as possible or go to the Emergency Department if any problems should occur.  Please show the CHEMOTHERAPY ALERT CARD or IMMUNOTHERAPY ALERT CARD at  check-in to the Emergency Department and triage nurse.  Should you have questions after your visit or need to cancel or reschedule your appointment, please contact [facility]  Dept: [phone]  and follow the prompts.  Office hours are 8:00 a.m. to 4:30 p.m. Monday - Friday. Please note that voicemails left after 4:00 p.m. may not be returned until the following business day.  We are closed weekends and major holidays. You have access to a nurse at all times for urgent questions. Please call the main number to the clinic Dept: [phone] and follow the prompts.   For any non-urgent questions, you may also contact your provider using MyChart. We now offer e-Visits for anyone 18 and older to request care online for non-urgent symptoms. For details visit [email or web address].   Also download the MyChart app! Go to the app store, search "MyChart", open the app, select [facility], and log in with your MyChart username and password.  Due to Covid, a mask is required upon entering the hospital/clinic. If you do not have a mask, one will be given to you upon arrival. For doctor visits, patients may have 1 support person aged 18 or older with them. For treatment visits, patients cannot have anyone with them due to current Covid guidelines and our immunocompromised population.   

## 2020-11-10 ENCOUNTER — Encounter: Payer: Self-pay | Admitting: Hematology

## 2020-11-10 ENCOUNTER — Encounter: Payer: Self-pay | Admitting: Physician Assistant

## 2020-11-10 ENCOUNTER — Other Ambulatory Visit (HOSPITAL_COMMUNITY): Payer: Self-pay

## 2020-11-10 DIAGNOSIS — Z66 Do not resuscitate: Secondary | ICD-10-CM | POA: Insufficient documentation

## 2020-11-10 MED ORDER — DEXAMETHASONE 4 MG PO TABS
4.0000 mg | ORAL_TABLET | Freq: Two times a day (BID) | ORAL | 1 refills | Status: DC
  Filled 2020-11-10: qty 30, 15d supply, fill #0

## 2020-11-10 NOTE — Progress Notes (Signed)
DISCONTINUE OFF PATHWAY REGIMEN - Gastroesophageal   [id number]:Pembrolizumab 200 mg IV D1 q21 Days:   A cycle is every 21 days:     Pembrolizumab   **Always confirm dose/schedule in your pharmacy ordering system**  REASON: Disease Progression PRIOR TREATMENT: Off Pathway: Pembrolizumab 200 mg IV D1 q21 Days TREATMENT RESPONSE: Partial Response (PR)  START OFF PATHWAY REGIMEN - Gastroesophageal   [id number]:Docetaxel 75 mg/m2:   A cycle is every 21 days:     Docetaxel   **Always confirm dose/schedule in your pharmacy ordering system**  Patient Characteristics: Distant Metastases (cM1/pM1) / Locally Recurrent Disease, Squamous Cell, Esophageal & GE Junction, Third Line and Beyond, MSS/pMMR or MSI Unknown, PD-L1 Expression  PositiveCPS ? 10 Histology: Squamous Cell Disease Classification: Esophageal Therapeutic Status: Distant Metastases (No Additional Staging) Line of Therapy: Third Engineer, civil (consulting) Status: MSS/pMMR PD-L1 Expression Status: PD-L1 Expression PositiveCPS ? 10 Intent of Therapy: Non-Curative / Palliative Intent, Discussed with Patient

## 2020-11-12 ENCOUNTER — Inpatient Hospital Stay: Payer: Self-pay

## 2020-11-12 ENCOUNTER — Other Ambulatory Visit: Payer: Self-pay

## 2020-11-12 ENCOUNTER — Telehealth: Payer: Self-pay | Admitting: Hematology

## 2020-11-12 ENCOUNTER — Encounter: Payer: Self-pay | Admitting: Hematology

## 2020-11-12 ENCOUNTER — Other Ambulatory Visit (HOSPITAL_COMMUNITY): Payer: Self-pay

## 2020-11-12 ENCOUNTER — Ambulatory Visit: Payer: Self-pay

## 2020-11-12 ENCOUNTER — Encounter: Payer: Self-pay | Admitting: Physician Assistant

## 2020-11-12 VITALS — BP 116/81 | HR 112 | Temp 99.9°F | Resp 18

## 2020-11-12 DIAGNOSIS — D702 Other drug-induced agranulocytosis: Secondary | ICD-10-CM

## 2020-11-12 DIAGNOSIS — D500 Iron deficiency anemia secondary to blood loss (chronic): Secondary | ICD-10-CM

## 2020-11-12 MED ORDER — FILGRASTIM-SNDZ 300 MCG/0.5ML IJ SOSY
300.0000 ug | PREFILLED_SYRINGE | Freq: Once | INTRAMUSCULAR | Status: AC
  Administered 2020-11-12: 300 ug via SUBCUTANEOUS
  Filled 2020-11-12: qty 0.5

## 2020-11-12 NOTE — Telephone Encounter (Signed)
Scheduled follow-up appointment per 10/14 los. Patient's niece is aware.

## 2020-11-12 NOTE — Patient Instructions (Signed)
Filgrastim, G-CSF injection Qu es este medicamento? El FILGRASTIM, G-CSF es un factor estimulante de colonias de granulocitos que estimula el crecimiento de los neutrfilos, un tipo de glbulo blanco importante en la lucha del cuerpo contra las infecciones. [place] para reducir la incidencia de fiebre e infeccin en pacientes con ciertos tipos de cncer que estn recibiendo quimioterapia que afecta la mdula sea, para estimular la produccin de clulas sanguneas a fin de eliminar los glbulos blancos del cuerpo antes de un trasplante de mdula sea, para reducir la incidencia de fiebre e infeccin en pacientes que tienen neutropenia crnica grave, y para [employer] de supervivencia despus de exposicin a radiacin de dosis alta que es txica para la mdula sea. Este medicamento puede ser utilizado para otros usos; si tiene alguna pregunta consulte con su proveedor de atencin mdica o con su farmacutico. MARCAS COMUNES: Neupogen, Nivestym, Releuko, Zarxio [employer] debo informar a mi profesional de la salud antes de tomar este medicamento? Necesitan saber si usted presenta alguno de los siguientes problemas o situaciones: enfermedad renal alergia al ltex terapia de radiacin en curso enfermedad de clulas falciformes una reaccin alrgica o inusual al filgrastim, pegfilgrastim, a otros medicamentos, alimentos, colorantes o conservantes si est embarazada o buscando quedar embarazada si est amamantando a un beb Cmo debo utilizar este medicamento? Este medicamento se administra mediante una inyeccin por va subcutnea o mediante infusin por va intravenosa. Cuando es mediante infusin por va intravenosa, generalmente lo administra un profesional de la salud en un hospital o en un entorno clnico. Si recibe Coca[employer]a en su casa, le ensearn cmo prepararlo y administrarlo. Consulte las Instrucciones de uso que vienen con el envase de su medicamento. Use el medicamento  exactamente como se le indique. Use su medicamento a intervalos regulares. No use su medicamento con una frecuencia mayor a la indicada. Es importante que deseche las agujas y las jeringas usadas en un recipiente resistente a los pinchazos. No las deseche en la basura. Si no tiene un recipiente resistente a los pinchazos, llame a su farmacutico o proveedor de atencin de la salud para obtenerlo. Hable con su pediatra para informarse acerca del uso de este medicamento en nios. Aunque este medicamento se puede recetar a nios tan pequeos como de 7 meses de edad con ciertas afecciones, existen precauciones que deben tomarse. Sobredosis: Pngase en contacto inmediatamente con un centro toxicolgico o una sala de urgencia si usted cree que haya tomado demasiado medicamento. ATENCIN: [employer] es solo para usted. No comparta este medicamento con nadie. Qu sucede si me olvido de una dosis? Es importante no olvidar ninguna dosis. Hable con su mdico o profesional de la salud si [employer] dosis. Qu puede interactuar con este medicamento? Este medicamento podra interactuar con los siguientes frmacos: medicamentos que pueden causar una liberacin de neutrfilos, tales como litio Puede ser que esta lista no menciona todas las posibles interacciones. Informe a su profesional de [employer]	[place] de [employer] productos a base de hierbas, medicamentos de [place] o suplementos nutritivos que est tomando. Si usted fuma, consume bebidas alcohlicas o si utiliza drogas ilegales, indqueselo tambin a su profesional de [employer]	[place]. Algunas sustancias pueden interactuar con su medicamento. A qu debo estar atento al usar Coca-Cola? Se supervisar su estado de salud atentamente mientras reciba este medicamento. Usted podra necesitar realizarse [employer] de sangre mientras est usando [place]. Hable con su proveedor de atencin mdica sobre su riesgo de cncer. Usted puede tener mayor riesgo  para  ciertos tipos de cncer si [place] este medicamento. Qu efectos secundarios puedo tener al [employer] este medicamento? Efectos secundarios que debe informar a su mdico o a Barrister's clerk de la salud tan pronto como sea posible: Chief of Staff, tales como erupcin cutnea, comezn/picazn o urticaria, e hinchazn de la cara, los labios o la Holiday representative de espalda mareos o sensacin de Marine scientist, enrojecimiento o Actor de la inyeccin puntos rojos en la piel falta de aire o problemas respiratorios signos y sntomas de lesin al rin, tales como dificultad para Garment/textile technologist, cambios en la cantidad de [place], u orina color rojo o Forensic psychologist oscuro dolor de Paramedic o del costado, o Social research officer, government en el hombro hinchazn cansancio sangrado o moretones inusuales Efectos secundarios que generalmente no requieren atencin mdica (infrmelos a su mdico o a Barrister's clerk de la salud si persisten o si son molestos): dolor de huesos tos diarrea cada del cabello dolor de Research officer, trade union Puede ser que esta lista no menciona todos los posibles efectos secundarios. Comunquese a su mdico por asesoramiento mdico [employer]. Usted puede informar los efectos secundarios a la FDA por telfono al 1-800-FDA-1088. Dnde debo guardar mi medicina? Mantenga fuera del alcance de los nios. Guarde en el refrigerador a una temperatura de [place] 2 y 91 grados Celsius (entre 37 y 73 grados Fahrenheit). No congele. Mantenga en el envase para proteger de Naval architect. Deseche este medicamento si los frascos o jeringas quedan fuera del refrigerador durante ms de 24 horas. Deseche todo el medicamento que no haya utilizado despus de la fecha de vencimiento. ATENCIN: Este folleto es un resumen. Puede ser que no cubra toda la posible informacin. Si usted tiene preguntas acerca de esta medicina, consulte con su mdico, su farmacutico o su profesional de Technical sales engineer.  2022 Elsevier/Gold Standard  (2019-07-19 00:00:00)

## 2020-11-12 NOTE — Progress Notes (Signed)
..  Patient Assist/Replace for the following has been terminated. Medication: Keytruda (pembrolizumab) Reason for Termination: Disease progression, medication discontinued 11/09/2020. Last DOS: 11/02/2020. Marland KitchenJuan Quam, CPhT IV Drug Replacement Specialist [place] Phone: [phone]

## 2020-11-13 ENCOUNTER — Encounter: Payer: Self-pay | Admitting: Hematology

## 2020-11-13 ENCOUNTER — Telehealth: Payer: Self-pay

## 2020-11-13 NOTE — Progress Notes (Signed)
Created GFE(Good Faith Estimate) to provide to patient. 

## 2020-11-13 NOTE — Telephone Encounter (Addendum)
This nurse placed a call to patient using pacific interpreters  and reached his niece and she accepted the message.  Informed her that  Dexa prescription was ready for pick up at [facility].  Informed of instructions for taking medication.  Acknowledged understanding and she will go and pick up the medication for him.  No further questions or concerns at this time.  Advised if there are any questions or concerns to give [name] a call at the clinic.       ----- Message from [name], MD sent at 11/10/2020 11:25 AM EDT ----- Please let pt know that I called in dexa to [facility], he needs to take 1 tab twice daily the day before next cycle chemo docetaxel, which will be scheduled for 10/28  Thanks   Krista Blue

## 2020-11-16 NOTE — Progress Notes (Signed)
..  Patient is receiving Assistance Medication - Supplied Externally. Medication: Udenyca (pegfilgrastim-cbqv) Manufacture: [employer] Approval Dates: Approved from 11/16/2020 until 11/16/2021. ID: [id number] Reason: Self Pay First DOS: 11/26/2020.  Marland KitchenJuan Quam, CPhT IV Drug Replacement Specialist [place] Phone: [phone]

## 2020-11-16 NOTE — Progress Notes (Signed)
Pharmacist Chemotherapy Monitoring - Initial Assessment    Anticipated start date: 11/23/20   The following has been reviewed per standard work regarding the patient's treatment regimen: The patient's diagnosis, treatment plan and drug doses, and organ/hematologic function Lab orders and baseline tests specific to treatment regimen  The treatment plan start date, drug sequencing, and pre-medications Prior authorization status  Patient's documented medication list, including drug-drug interaction screen and prescriptions for anti-emetics and supportive care specific to the treatment regimen The drug concentrations, fluid compatibility, administration routes, and timing of the medications to be used The patient's access for treatment and lifetime cumulative dose history, if applicable  The patient's medication allergies and previous infusion related reactions, if applicable   Changes made to treatment plan:  N/A  Follow up needed:  N/A   Jimmy Hawkins K, RPH, 11/16/2020  11:05 AM

## 2020-11-20 ENCOUNTER — Encounter: Payer: Self-pay | Admitting: Internal Medicine

## 2020-11-20 ENCOUNTER — Ambulatory Visit: Payer: Self-pay | Admitting: Internal Medicine

## 2020-11-20 VITALS — BP 112/67 | HR 107 | Temp 98.0°F | Ht 67.0 in | Wt 150.0 lb

## 2020-11-20 DIAGNOSIS — K222 Esophageal obstruction: Secondary | ICD-10-CM

## 2020-11-20 MED ORDER — SODIUM CHLORIDE 0.9 % IV SOLN
500.0000 mL | Freq: Once | INTRAVENOUS | Status: DC
Start: 2020-11-20 — End: 2020-11-20

## 2020-11-20 NOTE — Progress Notes (Signed)
Patient had a sandwich at 8:00 this morning, also had water to drink at 2:00 pm this afternoon.  Per Dr. Carlean Purl patient needs to be rescheduled.   Instructions provided to patient and procedure has been rescheduled.

## 2020-11-22 MED FILL — Dexamethasone Sodium Phosphate Inj 100 MG/10ML: INTRAMUSCULAR | Qty: 1 | Status: AC

## 2020-11-23 ENCOUNTER — Encounter: Payer: Self-pay | Admitting: Hematology

## 2020-11-23 ENCOUNTER — Other Ambulatory Visit: Payer: Self-pay

## 2020-11-23 ENCOUNTER — Inpatient Hospital Stay: Payer: Self-pay

## 2020-11-23 ENCOUNTER — Inpatient Hospital Stay (HOSPITAL_BASED_OUTPATIENT_CLINIC_OR_DEPARTMENT_OTHER): Payer: Self-pay | Admitting: Hematology

## 2020-11-23 VITALS — BP 99/68 | HR 78 | Temp 98.0°F | Resp 18 | Wt 156.8 lb

## 2020-11-23 DIAGNOSIS — C154 Malignant neoplasm of middle third of esophagus: Secondary | ICD-10-CM

## 2020-11-23 LAB — COMPREHENSIVE METABOLIC PANEL
ALT: 40 U/L (ref 0–44)
AST: 32 U/L (ref 15–41)
Albumin: 3.3 g/dL — ABNORMAL LOW (ref 3.5–5.0)
Alkaline Phosphatase: 83 U/L (ref 38–126)
Anion gap: 10 (ref 5–15)
BUN: 13 mg/dL (ref 6–20)
CO2: 24 mmol/L (ref 22–32)
Calcium: 8.4 mg/dL — ABNORMAL LOW (ref 8.9–10.3)
Chloride: 101 mmol/L (ref 98–111)
Creatinine, Ser: 0.61 mg/dL (ref 0.61–1.24)
GFR, Estimated: >60 mL/min (ref >60)
Glucose, Bld: 100 mg/dL — ABNORMAL HIGH (ref 70–99)
Potassium: 3.4 mmol/L — ABNORMAL LOW (ref 3.5–5.1)
Sodium: 135 mmol/L (ref 135–145)
Total Bilirubin: 0.5 mg/dL (ref 0.3–1.2)
Total Protein: 6.4 g/dL — ABNORMAL LOW (ref 6.5–8.1)

## 2020-11-23 LAB — CBC WITH DIFFERENTIAL/PLATELET
Abs Immature Granulocytes: 0.89 10*3/uL — ABNORMAL HIGH (ref 0.00–0.07)
Basophils Absolute: 0.1 10*3/uL (ref 0.0–0.1)
Basophils Relative: 1 %
Eosinophils Absolute: 0 10*3/uL (ref 0.0–0.5)
Eosinophils Relative: 0 %
HCT: 36.6 % — ABNORMAL LOW (ref 39.0–52.0)
Hemoglobin: 12.6 g/dL — ABNORMAL LOW (ref 13.0–17.0)
Immature Granulocytes: 10 %
Lymphocytes Relative: 10 %
Lymphs Abs: 0.9 10*3/uL (ref 0.7–4.0)
MCH: 33.8 pg (ref 26.0–34.0)
MCHC: 34.4 g/dL (ref 30.0–36.0)
MCV: 98.1 fL (ref 80.0–100.0)
Monocytes Absolute: 1.5 10*3/uL — ABNORMAL HIGH (ref 0.1–1.0)
Monocytes Relative: 17 %
Neutro Abs: 5.6 10*3/uL (ref 1.7–7.7)
Neutrophils Relative %: 62 %
Platelets: 161 10*3/uL (ref 150–400)
RBC: 3.73 MIL/uL — ABNORMAL LOW (ref 4.22–5.81)
RDW: 15 % (ref 11.5–15.5)
WBC: 8.9 10*3/uL (ref 4.0–10.5)
nRBC: 0.2 % (ref 0.0–0.2)

## 2020-11-23 MED ORDER — HYDROCODONE-ACETAMINOPHEN 7.5-325 MG/15ML PO SOLN
10.0000 mL | Freq: Four times a day (QID) | ORAL | 0 refills | Status: DC | PRN
  Filled 2020-11-23: qty 473, 12d supply, fill #0

## 2020-11-23 MED ORDER — DOCETAXEL CHEMO INJECTION 160 MG/16ML
75.0000 mg/m2 | Freq: Once | INTRAVENOUS | Status: AC
  Administered 2020-11-23: 140 mg via INTRAVENOUS
  Filled 2020-11-23: qty 14

## 2020-11-23 MED ORDER — SODIUM CHLORIDE 0.9 % IV SOLN: Freq: Once | INTRAVENOUS | Status: AC

## 2020-11-23 MED ORDER — SODIUM CHLORIDE 0.9 % IV SOLN
10.0000 mg | Freq: Once | INTRAVENOUS | Status: AC
  Administered 2020-11-23: 10 mg via INTRAVENOUS
  Filled 2020-11-23: qty 10

## 2020-11-23 NOTE — Patient Instructions (Signed)
[facility]  Discharge Instructions: Thank you for choosing [facility] to provide your oncology and hematology care.   If you have a lab appointment with the [facility], please go directly to the [facility] and check in at the registration area.   Wear comfortable clothing and clothing appropriate for easy access to any Portacath or PICC line.   We strive to give you quality time with your provider. You may need to reschedule your appointment if you arrive late (15 or more minutes).  Arriving late affects you and other patients whose appointments are after yours.  Also, if you miss three or more appointments without notifying the office, you may be dismissed from the clinic at the provider's discretion.      For prescription refill requests, have your pharmacy contact our office and allow 72 hours for refills to be completed.    Today you received the following chemotherapy and/or immunotherapy agents taxotere      To help prevent nausea and vomiting after your treatment, we encourage you to take your nausea medication as directed.  BELOW ARE SYMPTOMS THAT SHOULD BE REPORTED IMMEDIATELY: *FEVER GREATER THAN 100.4 F (38 C) OR HIGHER *CHILLS OR SWEATING *NAUSEA AND VOMITING THAT IS NOT CONTROLLED WITH YOUR NAUSEA MEDICATION *UNUSUAL SHORTNESS OF BREATH *UNUSUAL BRUISING OR BLEEDING *URINARY PROBLEMS (pain or burning when urinating, or frequent urination) *BOWEL PROBLEMS (unusual diarrhea, constipation, pain near the anus) TENDERNESS IN MOUTH AND THROAT WITH OR WITHOUT PRESENCE OF ULCERS (sore throat, sores in mouth, or a toothache) UNUSUAL RASH, SWELLING OR PAIN  UNUSUAL VAGINAL DISCHARGE OR ITCHING   Items with * indicate a potential emergency and should be followed up as soon as possible or go to the Emergency Department if any problems should occur.  Please show the CHEMOTHERAPY ALERT CARD or IMMUNOTHERAPY ALERT CARD at check-in to  the Emergency Department and triage nurse.  Should you have questions after your visit or need to cancel or reschedule your appointment, please contact [facility]  Dept: [phone]  and follow the prompts.  Office hours are 8:00 a.m. to 4:30 p.m. Monday - Friday. Please note that voicemails left after 4:00 p.m. may not be returned until the following business day.  We are closed weekends and major holidays. You have access to a nurse at all times for urgent questions. Please call the main number to the clinic Dept: [phone] and follow the prompts.   For any non-urgent questions, you may also contact your provider using MyChart. We now offer e-Visits for anyone 43 and older to request care online for non-urgent symptoms. For details visit [email or web address].   Also download the MyChart app! Go to the app store, search "MyChart", open the app, select [facility], and log in with your MyChart username and password.  Due to Covid, a mask is required upon entering the hospital/clinic. If you do not have a mask, one will be given to you upon arrival. For doctor visits, patients may have 1 support person aged 43 or older with them. For treatment visits, patients cannot have anyone with them due to current Covid guidelines and our immunocompromised population.

## 2020-11-23 NOTE — Progress Notes (Signed)
[place]   Telephone:(336) [phone] Fax:(336) [phone]   Clinic Follow up Note   Patient Care Team: [name], MD as PCP - General (Hematology) [name], MD as Consulting Physician (Oncology)  Date of Service:  11/23/2020  CHIEF COMPLAINT: f/u of esophageal cancer  CURRENT THERAPY:  First-line Keytruda q3weeks starting 04/27/20, added weekly carbo AUC 1.5 and Taxol 535m/m2 from cycle 4 (06/29/20) on day 1, 8 every 21 days   ASSESSMENT & PLAN:  Jimmy Usmanis a 45y.o. male with   1. 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 08/11/19 CT CAP found him to have mid esophageal mass and enlarged AP window node, no distant metastasis. -He completed 6 weeks of concurrent chemoRT with weekly Carboplatin and Taxol 08/29/19-10/06/19 -Repeat PET scan October 2021 showed hypermetabolic wall thickening of the distal half of the thoracic esophageus, no notable distant metastasis.  -He received CAPOX q3weeks with Xeloda 15075mBID 2 weeks on/1 week off 12/20/19-02/2020. -He completed palliative Radiation to sacrum with Dr MoLisbeth Renshaw/28/22-3/15/22.  -He started First-line single agent Keytruda 04/27/20. Due to his slow improvement and squamous cell histology, we added chemo with carboplatin and Taxol 2 weeks on/1 week off from C4 on 06/29/20.  -restaging PET 11/08/20 showed worsening diease to the left sacral lesion and mild increase in activity to esophageal mass. No new metastasis. -he is switching to docetaxel today, 11/23/20. I recommend port as his veins can be difficult to access. He is agreeable.  2. Symptom Management: Dysphagia and weight loss, blurry vision, falls -He continues to have difficulty swallowing, and as a result has lost weight. -he is undergoing esophageal dilation under Dr. GeCarlean Purlmost recently 11/20/20 and repeat  scheduled 12/24/20. -he reports increased appetite on mirtazapine. -he reports new blurry vision and several fall at home the last two weeks (10/14-10/28)   2. Sacral/back pain, LE weakness, secondary to bone metastasis -He was seen to have bone mets on 04/11/20 PET scan. -He completed palliative Radiation to sacrum with Dr MoLisbeth Renshaw/28/22-3/15/22. He was given short course dexa 35m25m -restaging PET 11/08/20 showed continued worsening to left sacral metastatic lesion with erosion across SI joint into left iliac bone, left sacrum, and sacral spinal canal. -He is currently taking hydrocodone 10 cc, MS Contin 30 mg q8hr, and oxycodone 5 cc q6hr. I will refill his hydrocodone per his request.   3. Goal of care discussion, DNR  -We again discussed the incurable nature of his cancer, and the overall poor prognosis, especially if he does not have good response to chemotherapy or progress on chemo -The patient understands the goal of care is palliative. -I recommend DNR/DNI, he agreed 11/09/20   4. H/o Heavy alcohol user -He quit drinking since cancer diagnosis, but started again. Due to weakness, he is not able to drink as much since 04/13/20 and stopped overall 06/29/20 -on 10/12/20, he reported he is back to drinking 5 beers a day. I previously advised him to abstain for the day of and a few following chemo.   5. Social and Financial Support  -Patient does not have insurance and is not currently working. He has already applied for Medicaid. -He has met with financial advocate Shauna and SW and he has grant assistance with medications.   7. Goal of care discussion, DNR  -We again discussed the incurable nature of his cancer, and  the overall poor prognosis, especially if he does not have good response to chemotherapy or progress on chemo -The patient understands the goal of care is palliative. -We discussed code status today. I recommend DNR/DNI, he agreed. Will document      PLAN: -proceed with C1  docetaxel today  -udenyca on day 3 -I will refill his hydrocodone -referral for port placement -lab, flush, f/u, and C2 as scheduled on 12/14/20   No problem-specific Assessment & Plan notes found for this encounter.   SUMMARY OF ONCOLOGIC HISTORY: Oncology History Overview Note  Cancer Staging Malignant neoplasm of middle third of esophagus ([place]) Staging form: Esophagus - Squamous Cell Carcinoma, AJCC 8th Edition - Clinical: Stage Unknown (cTX, cN1, cM0) - Signed by [name], MD on 08/18/2019    Malignant neoplasm of middle third of esophagus [facility])   Initial Diagnosis   Malignant neoplasm of middle third of esophagus ([place])   08/11/2019 Imaging   CT CAP W contrast 08/11/19 IMPRESSION: 1. Mid to lower esophageal mass along an 8 cm segment, large endoluminal component, strongly favoring esophageal malignancy. Borderline enlarged AP window lymph node. No findings of distant metastatic disease. 2. Trace bilateral pleural effusions. 3. Diffuse hepatic steatosis. 4. Cholelithiasis. 5. Fatty spermatic cords likely due to small bilateral indirect inguinal hernias.   08/11/2019 Procedure   EGD by Dr Carlean Purl  IMPRESSION - Partially obstructing, likely malignant esophageal tumor was found in the upper third of the esophagus and in the middle third of the esophagus. Biopsied. Very large and long - difficult but able to advance scope by this lesion - await CT for better length estimate - Normal stomach. - Normal examined duodenum.   08/11/2019 Initial Biopsy   FINAL MICROSCOPIC DIAGNOSIS:   A. ESOPHAGUS, UPPER, BIOPSY:  - Squamous cell carcinoma.    08/11/2019 Genetic Testing   PD-L1 Expression 90%   08/18/2019 Cancer Staging   Staging form: Esophagus - Squamous Cell Carcinoma, AJCC 8th Edition - Clinical: Stage Unknown (cTX, cN1, cM0) - Signed by [name], MD on 08/18/2019    08/29/2019 - 10/04/2019 Chemotherapy   Concurrent chemoradiation with weekly CT for 6 weeks starting  08/29/19-10/04/19    08/29/2019 - 10/06/2019 Radiation Therapy   Concurrent chemoradiation with Dr Lisbeth Renshaw starting 08/29/19-10/06/19   11/11/2019 PET scan   IMPRESSION: 1. Again seen is a long segment of FDG avid wall thickening of the distal half of the thoracic esophagus compatible with known esophageal neoplasm. 2. No signs of FDG avid nodal or distant metastatic disease.     11/28/2019 Procedure   EUS by Dr Rush Landmark IMPRESSION EGD Impression: - No gross lesions in esophagus proximally. - Previously noted esophageal cancer is not present. However, in its place and extending distal to this is likely LA Grade D chemotherapy/radiation esophagitis with bleeding. Biopsied after the EUS was complete to evaluate for persistent disease or not. - Z-line regular, 37 cm from the incisors. - 3 cm hiatal hernia. - Erythematous mucosa in the stomach. No other gross lesions in the stomach. Biopsied. - No gross lesions in the duodenal bulb, in the first portion of the duodenum and in the second portion of the duodenum. EUS Impression: - Wall thickening was seen in the thoracic esophagus into the gastroesophageal junction. The thickening appeared to primarily be within the luminal interface/superficial mucosa (Layer 1) and deep mucosa (Layer 2). The previously noted esophageal cancer has been replaced by what appears to be radiation/chemotherapy induced changes. With the limitations of having to use the  mini-probe EUS, I could not visualize persistent significant disease other than the entire distal area of the esophagus having likely changes noted as above. - No malignant-appearing lymph nodes were visualized in the middle paraesophageal mediastinum (level 54M), lower paraesophageal mediastinum (level 8L), diaphragmatic region (level 15) and paracardial region (level 16).      FINAL MICROSCOPIC DIAGNOSIS:   A. STOMACH, BIOPSY:  - Mildly active chronic gastritis.  - Warthin-Starry negative for  Helicobacter pylori.  - No intestinal metaplasia, dysplasia or carcinoma.   B. ESOPHAGUS, BIOPSY:  - Inflamed granulation tissue and exudate consistent with ulcer.  - No malignancy identified.    12/20/2019 - 02/2020 Chemotherapy   CAPOX q3weeks with Xeloda 1558m BID 2 weeks on/1 week off starting 12/20/19-02/2020   03/21/2020 [facility]   [facility]Circumferential thickening of the lower third of the esophagus which corresponds with known esophageal malignancy. No evidence of metastatic disease within the chest.  --For findings below the diaphragm, please see concurrent separately reported CT of the abdomen and pelvis.  --Expansile lytic mass with associated soft tissue component centered in the left sacral ala and abutting the left S1 and S2 nerve roots, left internal iliac vasculature, and left pyriformis muscle with no other metastatic disease within the abdomen or pelvis.   --Diffuse thickening of the distal esophagus compatible with known esophageal malignancy.   --Cholelithiasis with nonspecific gallbladder distention. Clinical correlation is recommended. If there is clinical concern for cholecystitis, further evaluation with ultrasound can be obtained.   03/21/2020 Imaging   CT Lumbar Spine  Expansile lytic lesion centered in the left sacral ala with no other evidence of metastatic disease in the lumbar spine.   03/26/2020 - 04/10/2020 Radiation Therapy   Palliative Radiation to Sacrum with Dr MLisbeth Renshaw2/28/22-3/15/22.    04/11/2020 Progression   PET  IMPRESSION: 1. New hypermetabolic expansile 6.1 cm upper left sacral lytic bone metastasis, centrally necrotic. 2. New hypermetabolic left level 1b and left level 2 neck lymph nodes, indeterminate for reactive versus metastatic nodes. 3. Persistent hypermetabolism associated with a short segment of mid to lower thoracic esophagus with associated circumferential esophageal wall thickening, similar in metabolism and decreased  in wall thickness since 11/11/2019 PET-CT, nonspecific, favor post treatment change, with residual neoplasm not excluded. 4. No additional sites of hypermetabolic metastatic disease. 5.  Chronic findings include: Cholelithiasis.   04/27/2020 -  Chemotherapy   First-line with immunotherapy Keytruda q3weeks starting 04/27/20       06/29/2020 -  Chemotherapy   Weekly carbo AUC 1.5 and Taxol 655mm2 on day 1, 8 every 21 days added to first-line Keytruda from C4 on 06/29/20.     07/17/2020 PET scan   IMPRESSION: 1. Left sacral metastasis is slightly enlarged and slightly increased in peripheral hypermetabolism. No additional evidence of metastatic disease. 2. Similar mild hypermetabolism associated with the mid esophagus. No residual hypermetabolism within left neck lymph nodes. 3. Small pericardial effusion. 4. Trace bilateral pleural effusions. 5. Cholelithiasis.     09/14/2020 Procedure   Upper Endoscopy  Impression: - Esophagitis. Biopsied. - Esophageal stenosis. Dilated. Biopsied. this looks most like a mid-distal inflammatory stricture (? post XRT +/- reflux, Candida?) scope passed through but styill felt gerip on scope so did not advance deeper into stomach to avoid trauma. - Normal cardia.    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 - NO EVIDENCE OF DYSPLASIA OR MALIGNANCY. - SEE MICROSCOPIC DESCRIPTION. Microscopic Comment While  histologic changes of radiation effects are not apparent in the inflamed granulation tissue, the presence of ulceration can be indicative of radiation effects.   11/08/2020 PET scan   IMPRESSION: 1. Substantial (about 70%) increase in volume of the left sacral metastatic lesion which has central necrosis and peripheral hypermetabolic activity. This lesion now has increased erosion across the SI joint into the left iliac bone and into the left sacrum and sacral spinal  canal. 2. Mild increase in activity in the thickened segment of mid to distal esophagus corresponding to the original mass, current maximum SUV 6.2 (formerly 4.6). 3. Stable small posterior pericardial effusion.   11/23/2020 -  Chemotherapy   Patient is on Treatment Plan : LUNG Docetaxel q21d        INTERVAL HISTORY:  Jimmy Hawkins is here for a follow up of esophageal cancer. He was last seen by me on 11/09/20. He presents to the clinic accompanied by his wife and an interpreter. He reports he is losing his voice. He reports pain to his throat and the top of his stomach. He reports he has been taking the dexamethasone for the last 5 days. He also reports blurry vision and several falls at home, as well as some trouble sleeping. He reports currently feeling dizzy.   All other systems were reviewed with the patient and are negative.  MEDICAL HISTORY:  Past Medical History:  Diagnosis Date   Blood transfusion without reported diagnosis    Hematemesis with nausea 08/10/2019   Squamous cell carcinoma, esophagus ([place]) 04/2019   metastatic    SURGICAL HISTORY: Past Surgical History:  Procedure Laterality Date   BIOPSY  08/11/2019   Procedure: BIOPSY;  Surgeon: [name], MD;  Location: [facility] ENDOSCOPY;  Service: Endoscopy;;   BIOPSY  11/28/2019   Procedure: BIOPSY;  Surgeon: [name], MD;  Location: [facility] ENDOSCOPY;  Service: Gastroenterology;;   ESOPHAGOGASTRODUODENOSCOPY  08/11/2019   ESOPHAGOGASTRODUODENOSCOPY (EGD) WITH PROPOFOL N/A 08/11/2019   Procedure: ESOPHAGOGASTRODUODENOSCOPY (EGD) WITH PROPOFOL;  Surgeon: [name], MD;  Location: [place];  Service: Endoscopy;  Laterality: N/A;   ESOPHAGOGASTRODUODENOSCOPY (EGD) WITH PROPOFOL N/A 11/28/2019   Procedure: ESOPHAGOGASTRODUODENOSCOPY (EGD) WITH PROPOFOL;  Surgeon: Rush Landmark Telford Nab., MD;  Location: [place];  Service: Gastroenterology;  Laterality: N/A;   EUS N/A 11/28/2019    Procedure: UPPER ENDOSCOPIC ULTRASOUND (EUS) RADIAL;  Surgeon: [name], MD;  Location: [place];  Service: Gastroenterology;  Laterality: N/A;    I have reviewed the social history and family history with the patient and they are unchanged from previous note.  ALLERGIES:  has No Known Allergies.  MEDICATIONS:  Current Outpatient Medications  Medication Sig Dispense Refill   dexamethasone (DECADRON) 4 MG tablet Take 1 tablet (4 mg total) by mouth 2 (two) times daily. Start the day before Taxotere. Then daily after chemo for 2 days. 30 tablet 1   diphenhydrAMINE-APAP, sleep, (TYLENOL PM EXTRA STRENGTH) 50-1000 MG/30ML LIQD Take 15 mLs by mouth at bedtime as needed (sleep).     famotidine (PEPCID) 20 MG tablet TOME 1 PASTILLA POR VIA ORAL DOS VECES AL DIA 60 tablet 3   Ferrous Sulfate (IRON) 325 (65 Fe) MG TABS Take 1 tablet (325 mg total) by mouth 2 (two) times daily. 60 tablet 3   ferrous sulfate 325 (65 FE) MG tablet TAKE 1 TABLET BY MOUTH 2 TIMES DAILY. 60 tablet 3   gabapentin (NEURONTIN) 300 MG capsule Take 1 capsule (300 mg total) by mouth 3 (three)  times daily. 90 capsule 0   HYDROcodone-acetaminophen (HYCET) 7.5-325 mg/15 ml solution Take 10 mLs by mouth every 6 (six) hours as needed for moderate pain. 473 mL 0   mirtazapine (REMERON) 15 MG tablet Take 1 tablet (15 mg total) by mouth at bedtime. 30 tablet 2   morphine (MS CONTIN) 30 MG 12 hr tablet Take 1 tablet (30 mg total) by mouth every 8 (eight) hours. 90 tablet 0   oxyCODONE (ROXICODONE) 5 MG/5ML solution Take 5 mLs (5 mg total) by mouth every 6 (six) hours as needed for severe pain. OK to refill on 11/12/2020 473 mL 0   pantoprazole (PROTONIX) 40 MG tablet Take 1 tablet (40 mg total) by mouth 2 (two) times daily before a meal. 60 tablet 3   prochlorperazine (COMPAZINE) 10 MG tablet Take 1 tablet by mouth every 6 hours as needed for nausea or vomiting. 60 tablet 2   sucralfate (CARAFATE) 1 g tablet Take 1 tablet  by mouth 4 times daily. (Dissolve in 15 mls of water before taking) 120 tablet 2   No current facility-administered medications for this visit.   Facility-Administered Medications Ordered in Other Visits  Medication Dose Route Frequency Provider Last Rate Last Admin   diphenhydrAMINE (BENADRYL) 50 MG/ML injection            diphenhydrAMINE (BENADRYL) 50 MG/ML injection            famotidine (PEPCID) 20 mg IVPB            famotidine (PEPCID) 20 mg IVPB            palonosetron (ALOXI) 0.25 MG/5ML injection            palonosetron (ALOXI) 0.25 MG/5ML injection             PHYSICAL EXAMINATION: ECOG PERFORMANCE STATUS: 3 - Symptomatic, >50% confined to bed  There were no vitals filed for this visit. Wt Readings from Last 3 Encounters:  11/23/20 156 lb 12.8 oz (71.1 kg)  11/20/20 150 lb (68 kg)  11/09/20 150 lb 8 oz (68.3 kg)     GENERAL:alert, no distress and comfortable SKIN: skin color, texture, turgor are normal, no rashes or significant lesions EYES: normal, Conjunctiva are pink and non-injected, sclera clear  Musculoskeletal:no cyanosis of digits and no clubbing; (+) tenderness to side related to recent fall NEURO: alert & oriented x 3 with fluent speech, no focal motor/sensory deficits  LABORATORY DATA:  I have reviewed the data as listed CBC Latest Ref Rng & Units 11/23/2020 11/09/2020 11/02/2020  WBC 4.0 - 10.5 K/uL 8.9 2.5(L) 4.6  Hemoglobin 13.0 - 17.0 g/dL 12.6(L) 11.6(L) 11.7(L)  Hematocrit 39.0 - 52.0 % 36.6(L) 32.8(L) 34.5(L)  Platelets 150 - 400 K/uL 161 220 215     CMP Latest Ref Rng & Units 11/23/2020 11/09/2020 11/02/2020  Glucose 70 - 99 mg/dL 100(H) 97 111(H)  BUN 6 - 20 mg/dL 13 <5(L) 5(L)  Creatinine 0.61 - 1.24 mg/dL 0.61 0.37(L) 0.65  Sodium 135 - 145 mmol/L 135 137 136  Potassium 3.5 - 5.1 mmol/L 3.4(L) 3.7 3.5  Chloride 98 - 111 mmol/L 101 102 104  CO2 22 - 32 mmol/L 24 24 21(L)  Calcium 8.9 - 10.3 mg/dL 8.4(L) 9.1 9.6  Total Protein 6.5 - 8.1 g/dL  6.4(L) 8.2(H) 7.8  Total Bilirubin 0.3 - 1.2 mg/dL 0.5 0.4 0.6  Alkaline Phos 38 - 126 U/L 83 77 78  AST 15 - 41 U/L 32 32 21  ALT  0 - 44 U/L 40 15 11      RADIOGRAPHIC STUDIES: I have personally reviewed the radiological images as listed and agreed with the findings in the report. No results found.    No orders of the defined types were placed in this encounter.  All questions were answered. The patient knows to call the clinic with any problems, questions or concerns. No barriers to learning was detected. The total time spent in the appointment was 30 minutes.     [name], MD 11/23/2020   I, Wilburn Mylar, am acting as scribe for [name], MD.   I have reviewed the above documentation for accuracy and completeness, and I agree with the above.

## 2020-11-23 NOTE — Progress Notes (Signed)
Placed IR order for PortaCath placement.  Called IR Scheduling for confirmation of PortaCath orders and to try to get him scheduled.

## 2020-11-23 NOTE — Progress Notes (Signed)
Met with patient and accompanying adult to provide GFE(Good Faith Estimate) and supporting paperwork.  They have my card for any additional financial questions or concerns.

## 2020-11-24 ENCOUNTER — Other Ambulatory Visit (HOSPITAL_COMMUNITY): Payer: Self-pay

## 2020-11-24 ENCOUNTER — Encounter: Payer: Self-pay | Admitting: Hematology

## 2020-11-24 ENCOUNTER — Encounter: Payer: Self-pay | Admitting: Physician Assistant

## 2020-11-26 ENCOUNTER — Other Ambulatory Visit: Payer: Self-pay | Admitting: Hematology

## 2020-11-26 ENCOUNTER — Inpatient Hospital Stay: Payer: Self-pay

## 2020-11-26 ENCOUNTER — Encounter: Payer: Self-pay | Admitting: Hematology

## 2020-11-26 ENCOUNTER — Other Ambulatory Visit (HOSPITAL_COMMUNITY): Payer: Self-pay

## 2020-11-26 ENCOUNTER — Other Ambulatory Visit: Payer: Self-pay

## 2020-11-26 ENCOUNTER — Encounter: Payer: Self-pay | Admitting: Physician Assistant

## 2020-11-26 ENCOUNTER — Telehealth: Payer: Self-pay | Admitting: Hematology

## 2020-11-26 VITALS — BP 122/70 | HR 128 | Temp 101.8°F | Resp 20

## 2020-11-26 DIAGNOSIS — N3000 Acute cystitis without hematuria: Secondary | ICD-10-CM

## 2020-11-26 DIAGNOSIS — C154 Malignant neoplasm of middle third of esophagus: Secondary | ICD-10-CM

## 2020-11-26 LAB — URINALYSIS, COMPLETE (UACMP) WITH MICROSCOPIC
Bacteria, UA: NONE SEEN
Bilirubin Urine: NEGATIVE
Glucose, UA: NEGATIVE mg/dL
Hgb urine dipstick: NEGATIVE
Ketones, ur: NEGATIVE mg/dL
Leukocytes,Ua: NEGATIVE
Nitrite: NEGATIVE
Protein, ur: NEGATIVE mg/dL
Specific Gravity, Urine: 1.01 (ref 1.005–1.030)
pH: 6 (ref 5.0–8.0)

## 2020-11-26 MED ORDER — CIPROFLOXACIN HCL 500 MG PO TABS
500.0000 mg | ORAL_TABLET | Freq: Two times a day (BID) | ORAL | 0 refills | Status: DC
  Filled 2020-11-26: qty 10, 5d supply, fill #0

## 2020-11-26 MED ORDER — PEGFILGRASTIM-CBQV 6 MG/0.6ML ~~LOC~~ SOSY
6.0000 mg | PREFILLED_SYRINGE | Freq: Once | SUBCUTANEOUS | Status: AC
  Administered 2020-11-26: 6 mg via SUBCUTANEOUS
  Filled 2020-11-26: qty 0.6

## 2020-11-26 NOTE — Progress Notes (Signed)
Pt is here with elevated heart rate of 128 and elevated temperature of 101.8. Pt states he feels okay. MD Burr Medico made aware and told this LPN she would speak with pt.

## 2020-11-26 NOTE — Telephone Encounter (Signed)
Patient came in today for G-CSF injection.  He was found to be febrile, other vital signs were stable.  I saw him after injection, he reports 4 days history of urinary frequency and dysuria, he did not notice fever at home.  No chills.  He otherwise has no other new complaints.  I ordered UA and urine culture today and called in Cipro 500 mg twice daily for 5 days to [place] today.  He knows to call me back if he has recurrent fever or dysuria does not resolve in the next few days.   [name]  11/26/2020

## 2020-11-27 LAB — URINE CULTURE: Culture: NO GROWTH

## 2020-12-04 ENCOUNTER — Other Ambulatory Visit (HOSPITAL_COMMUNITY): Payer: Self-pay

## 2020-12-06 ENCOUNTER — Other Ambulatory Visit (HOSPITAL_COMMUNITY): Payer: Self-pay

## 2020-12-06 ENCOUNTER — Other Ambulatory Visit: Payer: Self-pay | Admitting: Hematology

## 2020-12-06 ENCOUNTER — Encounter: Payer: Self-pay | Admitting: Physician Assistant

## 2020-12-06 ENCOUNTER — Other Ambulatory Visit (HOSPITAL_COMMUNITY): Payer: Self-pay | Admitting: Physician Assistant

## 2020-12-06 ENCOUNTER — Encounter: Payer: Self-pay | Admitting: Hematology

## 2020-12-06 ENCOUNTER — Other Ambulatory Visit: Payer: Self-pay | Admitting: Internal Medicine

## 2020-12-06 MED ORDER — HYDROCODONE-ACETAMINOPHEN 7.5-325 MG/15ML PO SOLN
10.0000 mL | Freq: Four times a day (QID) | ORAL | 0 refills | Status: DC | PRN
  Filled 2020-12-06: qty 473, 12d supply, fill #0

## 2020-12-06 NOTE — H&P (Signed)
Chief Complaint: Patient was seen in consultation today for image guided port-a-catheter placement at the request of Feng,Yan  Referring Physician(s): Feng,Yan  Supervising Physician: Mir, Sharen Heck  Patient Status: [facility] - Out-pt  History of Present Illness: Jimmy Hawkins is a 45 y.o. 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  Patient has been under care of medical and radiation oncology and  he is s/p multiple rounds of cheoradiation therapy. PET on 11/09/20 showed progression of the disease, and a port-a-cath placement was recommended to the patient as  his chemotherapy regimen was changed on 11/23/20 and patient may develop poor venous access in the near future. After thorough discussion and shared decision making, patient decided to proceed with the port-a-cath placement.   IR was requested for image guided port-a-cath placement.  Patient presents to [facility] IR today for the procedure.  Of note, he was prescribe Cipro for UTI on 10/31, UA and culture were negative for UTI.  An Interpreter was utilized for the interview as patient's preferred language is [place].   Patient laying in bed, not in acute distress.  Denise headache, fever, chills, shortness of breath, cough, chest pain, abdominal pain, nausea ,vomiting, and bleeding.   Past Medical History:  Diagnosis Date   Blood transfusion without reported diagnosis    Cramps of left lower extremity 2021   Hematemesis with nausea 08/10/2019   Squamous cell carcinoma, esophagus ([place]) 04/2019   metastatic    Past Surgical History:  Procedure Laterality Date   BIOPSY  08/11/2019   Procedure: BIOPSY;  Surgeon: [name], MD;  Location: [facility] ENDOSCOPY;  Service: Endoscopy;;   BIOPSY  11/28/2019   Procedure: BIOPSY;  Surgeon: [name], MD;  Location: [facility] ENDOSCOPY;  Service: Gastroenterology;;   ESOPHAGOGASTRODUODENOSCOPY  08/11/2019    ESOPHAGOGASTRODUODENOSCOPY (EGD) WITH PROPOFOL N/A 08/11/2019   Procedure: ESOPHAGOGASTRODUODENOSCOPY (EGD) WITH PROPOFOL;  Surgeon: [name], MD;  Location: [place];  Service: Endoscopy;  Laterality: N/A;   ESOPHAGOGASTRODUODENOSCOPY (EGD) WITH PROPOFOL N/A 11/28/2019   Procedure: ESOPHAGOGASTRODUODENOSCOPY (EGD) WITH PROPOFOL;  Surgeon: Rush Landmark Telford Nab., MD;  Location: [place];  Service: Gastroenterology;  Laterality: N/A;   EUS N/A 11/28/2019   Procedure: UPPER ENDOSCOPIC ULTRASOUND (EUS) RADIAL;  Surgeon: [name], MD;  Location: [place];  Service: Gastroenterology;  Laterality: N/A;    Allergies: Patient has no known allergies.  Medications: Prior to Admission medications   Medication Sig Start Date End Date Taking? Authorizing Provider  ciprofloxacin (CIPRO) 500 MG tablet Take 1 tablet by mouth 2 (two) times daily. 11/26/20   [name], MD  dexamethasone (DECADRON) 4 MG tablet Take 1 tablet (4 mg total) by mouth 2 (two) times daily. Start the day before Taxotere. Then daily after chemo for 2 days. 11/10/20   [name], MD  diphenhydrAMINE-APAP, sleep, (TYLENOL PM EXTRA STRENGTH) 50-1000 MG/30ML LIQD Take 15 mLs by mouth at bedtime as needed (sleep).    [provider]  famotidine (PEPCID) 20 MG tablet TOME 1 PASTILLA POR VIA ORAL DOS VECES AL DIA 12/20/19 12/19/20  [name], MD  Ferrous Sulfate (IRON) 325 (65 Fe) MG TABS Take 1 tablet (325 mg total) by mouth 2 (two) times daily. 07/20/20 10/03/20  [name], MD  ferrous sulfate 325 (65 FE) MG tablet TAKE 1 TABLET BY MOUTH 2 TIMES DAILY. 05/18/20 05/18/21  [name], MD  gabapentin (NEURONTIN) 300 MG capsule Take 1 capsule (300 mg total) by  mouth 3 (three) times daily. 07/06/20   [name], MD  HYDROcodone-acetaminophen (HYCET) 7.5-325 mg/15 ml solution Take 10 mLs by mouth every 6 (six) hours as needed for moderate pain. 11/23/20 05/22/21  [name], MD  mirtazapine (REMERON) 15 MG tablet Take 1  tablet (15 mg total) by mouth at bedtime. 11/09/20   [name], MD  morphine (MS CONTIN) 30 MG 12 hr tablet Take 1 tablet (30 mg total) by mouth every 8 (eight) hours. 11/09/20 05/08/21  [name], MD  oxyCODONE (ROXICODONE) 5 MG/5ML solution Take 5 mLs (5 mg total) by mouth every 6 (six) hours as needed for severe pain. OK to refill on 11/12/2020 11/09/20   [name], MD  pantoprazole (PROTONIX) 40 MG tablet Take 1 tablet (40 mg total) by mouth 2 (two) times daily before a meal. 10/12/20   [name], MD  prochlorperazine (COMPAZINE) 10 MG tablet Take 1 tablet by mouth every 6 hours as needed for nausea or vomiting. 11/08/20 11/08/21  [name], MD  sucralfate (CARAFATE) 1 g tablet Take 1 tablet by mouth 4 times daily. (Dissolve in 15 mls of water before taking) 09/06/20   Levin Erp, PA  omeprazole (PRILOSEC) 40 MG capsule Take 1 capsule (40 mg total) by mouth daily. 09/12/19 11/28/19  [name], MD     Family History  Problem Relation Age of Onset   Esophageal cancer Mother    Stomach cancer Mother    Healthy Father    Colon cancer Neg Hx    Rectal cancer Neg Hx     Social History   Socioeconomic History   Marital status: Single    Spouse name: Not on file   Number of children: Not on file   Years of education: Not on file   Highest education level: Not on file  Occupational History   Not on file  Tobacco Use   Smoking status: Never   Smokeless tobacco: Never  Vaping Use   Vaping Use: Never used  Substance and Sexual Activity   Alcohol use: Yes    Alcohol/week: 30.0 standard drinks    Types: 30 Cans of beer per week   Drug use: Never   Sexual activity: Not on file  Other Topics Concern   Not on file  Social History Narrative   Not on file   Social Determinants of Health   Financial Resource Strain: Not on file  Food Insecurity: Not on file  Transportation Needs: Not on file  Physical Activity: Not on file  Stress: Not on file  Social Connections: Not on  file     Review of Systems: A 12 point ROS discussed and pertinent positives are indicated in the HPI above.  All other systems are negative.  Vital Signs: There were no vitals taken for this visit.   Physical Exam Vitals and nursing note reviewed.  Constitutional:      General: Patient is not in acute distress.    Appearance: Normal appearance. Patient is not ill-appearing.  HENT:     Head: Normocephalic and atraumatic.     Mouth/Throat:     Mouth: Mucous membranes are moist.     Pharynx: Oropharynx is clear.  Cardiovascular:     Rate and Rhythm: Normal rate and regular rhythm.     Pulses: Normal pulses.     Heart sounds: Normal heart sounds.  Pulmonary:     Effort: Pulmonary effort is normal.     Breath sounds: Normal breath sounds.  Abdominal:  General: Abdomen is flat. Bowel sounds are normal.     Palpations: Abdomen is soft.  Musculoskeletal:     Cervical back: Neck supple.  Skin:    General: Skin is warm and dry.     Coloration: Skin is not jaundiced or pale.  Neurological:     Mental Status: Patient is alert and oriented to person, place, and time.  Psychiatric:        Mood and Affect: Mood normal.        Behavior: Behavior normal.        Judgment: Judgment normal.    MD Evaluation Airway: WNL Heart: WNL Abdomen: WNL Chest/ Lungs: WNL ASA  Classification: 3 Mallampati/Airway Score: Two  Imaging: NM PET Image Restage (PS) Skull Base to Thigh (F-18 FDG)  Result Date: 11/09/2020 CLINICAL DATA:  Subsequent treatment strategy for squamous cell carcinoma the middle third of the esophagus. EXAM: NUCLEAR MEDICINE PET SKULL BASE TO THIGH TECHNIQUE: 8.2 mCi F-18 FDG was injected intravenously. Full-ring PET imaging was performed from the skull base to thigh after the radiotracer. CT data was obtained and used for attenuation correction and anatomic localization. Fasting blood glucose: 104 mg/dl COMPARISON:  Multiple exams, including 07/17/2020 FINDINGS:  Mediastinal blood pool activity: SUV max 2.5 Liver activity: SUV max NA NECK: No significant abnormal activity in the neck. Incidental physiologic muscular activity noted. Incidental CT findings: none CHEST: In the vicinity of the mid to distal esophageal mass there is chronic and continuing wall thickening, maximum SUV is currently 6.2 (formerly 4.6). No definite separate hypermetabolic adenopathy. Mild indistinctness of tissue planes around the affected segment of the esophagus as on prior exams. Incidental CT findings: Stable trace pericardial effusion posteriorly. ABDOMEN/PELVIS: Accentuated activity in the ascending colon, not previously present not associated with CT abnormality, hence likely physiologic. No hepatic lesion identified. Incidental CT findings: none SKELETON: Increased size of the large left sacral metastatic lesion currently 7.7 by 6.4 by 8.5 cm (volume = 220 cm^3), with increased erosion across the left SI joint and into the left iliac bone for example on image 160 of series 4, and increased erosion into the sacrum and sacral spinal canal eccentric to the left in the upper sacral region. The mass previously measured 7.2 by 4.9 by 7.1 cm (volume = 130 cm^3). This lesion has hypermetabolic activity along its margins, maximum SUV 9.8 (previously 9.2). There is mild increase in presacral stranding. The mass partially abuts the left piriformis muscle and displaces the left sciatic nerve above the level of the sciatic notch. There is hypo activity in the mid and lower thoracic vertebra likely related to prior radiation therapy of the esophageal mass. Incidental CT findings: none IMPRESSION: 1. Substantial (about 70%) increase in volume of the left sacral metastatic lesion which has central necrosis and peripheral hypermetabolic activity. This lesion now has increased erosion across the SI joint into the left iliac bone and into the left sacrum and sacral spinal canal. 2. Mild increase in activity in  the thickened segment of mid to distal esophagus corresponding to the original mass, current maximum SUV 6.2 (formerly 4.6). 3. Stable small posterior pericardial effusion. Electronically Signed   By: Van Clines M.D.   On: 11/09/2020 11:25    Labs:  CBC: Recent Labs    10/19/20 1356 11/02/20 1047 11/09/20 1035 11/23/20 1237  WBC 3.6* 4.6 2.5* 8.9  HGB 11.3* 11.7* 11.6* 12.6*  HCT 32.1* 34.5* 32.8* 36.6*  PLT 151 215 220 161    COAGS: No  results for input(s): INR, APTT in the last 8760 hours.  BMP: Recent Labs    10/19/20 1356 11/02/20 1047 11/09/20 1033 11/23/20 1237  NA 138 136 137 135  K 3.6 3.5 3.7 3.4*  CL 104 104 102 101  CO2 21* 21* 24 24  GLUCOSE 104* 111* 97 100*  BUN 4* 5* <5* 13  CALCIUM 9.4 9.6 9.1 8.4*  CREATININE 0.63 0.65 0.37* 0.61  GFRNONAA >60 >60 >60 >60    LIVER FUNCTION TESTS: Recent Labs    10/19/20 1356 11/02/20 1047 11/09/20 1033 11/23/20 1237  BILITOT 0.4 0.6 0.4 0.5  AST 20 21 32 32  ALT 9 11 15  40  ALKPHOS 76 78 77 83  PROT 7.4 7.8 8.2* 6.4*  ALBUMIN 3.7 3.8 4.1 3.3*    TUMOR MARKERS: No results for input(s): AFPTM, CEA, CA199, CHROMGRNA in the last 8760 hours.  Assessment and Plan: 45 y.o. male with metastatic esophageal cancer in need of long term venous access for chemotherapy.   IR was requested for image guided port-a-catheter placement.  Pt presents to [facility] IR today for the procedure.  NPO since [place] No VS taken yet. Not on AC/AP   Risks and benefits of image guided port-a-catheter placement was discussed with the patient including, but not limited to bleeding, infection, pneumothorax, or fibrin sheath development and need for additional procedures.  All of the patient's questions were answered, patient is agreeable to proceed. Consent signed and in chart.  Thank you for this interesting consult.  I greatly enjoyed meeting Jimmy Hawkins and look forward to participating in their care.  A copy of this  report was sent to the requesting provider on this date.  Electronically Signed: Tera Mater, PA-C 12/07/2020, 10:31 AM   I spent a total of  40 Minutes   in face to face in clinical consultation, greater than 50% of which was counseling/coordinating care for [facility] placement.   This chart was dictated using voice recognition software.  Despite best efforts to proofread,  errors can occur which can change the documentation meaning.

## 2020-12-07 ENCOUNTER — Other Ambulatory Visit: Payer: Self-pay

## 2020-12-07 ENCOUNTER — Ambulatory Visit (HOSPITAL_COMMUNITY)
Admission: RE | Admit: 2020-12-07 | Discharge: 2020-12-07 | Disposition: A | Discharge status: Home / Self Care | Payer: Self-pay | Source: Ambulatory Visit | Attending: Hematology | Admitting: Hematology

## 2020-12-07 ENCOUNTER — Encounter (HOSPITAL_COMMUNITY): Payer: Self-pay

## 2020-12-07 DIAGNOSIS — C154 Malignant neoplasm of middle third of esophagus: Secondary | ICD-10-CM

## 2020-12-07 DIAGNOSIS — C159 Malignant neoplasm of esophagus, unspecified: Secondary | ICD-10-CM | POA: Insufficient documentation

## 2020-12-07 HISTORY — PX: IR IMAGING GUIDED PORT INSERTION: IMG5740

## 2020-12-07 MED ORDER — FENTANYL CITRATE (PF) 100 MCG/2ML IJ SOLN
INTRAMUSCULAR | Status: AC | PRN
  Administered 2020-12-07 (×2): 50 ug via INTRAVENOUS

## 2020-12-07 MED ORDER — MIDAZOLAM HCL 2 MG/2ML IJ SOLN
INTRAMUSCULAR | Status: AC | PRN
  Administered 2020-12-07 (×2): 1 mg via INTRAVENOUS
  Administered 2020-12-07: 2 mg via INTRAVENOUS

## 2020-12-07 MED ORDER — FENTANYL CITRATE (PF) 100 MCG/2ML IJ SOLN
INTRAMUSCULAR | Status: AC
  Filled 2020-12-07: qty 2

## 2020-12-07 MED ORDER — SODIUM CHLORIDE 0.9 % IV SOLN: INTRAVENOUS | Status: DC

## 2020-12-07 MED ORDER — MIDAZOLAM HCL 2 MG/2ML IJ SOLN
INTRAMUSCULAR | Status: AC
  Filled 2020-12-07: qty 4

## 2020-12-07 MED ORDER — LIDOCAINE-EPINEPHRINE 2 %-1:100000 IJ SOLN
INTRAMUSCULAR | Status: AC | PRN
  Administered 2020-12-07: 20 mL

## 2020-12-07 MED ORDER — LIDOCAINE-EPINEPHRINE (PF) 2 %-1:200000 IJ SOLN
INTRAMUSCULAR | Status: AC
  Filled 2020-12-07: qty 20

## 2020-12-07 MED ORDER — HEPARIN SOD (PORK) LOCK FLUSH 100 UNIT/ML IV SOLN
INTRAVENOUS | Status: AC
  Filled 2020-12-07: qty 5

## 2020-12-07 NOTE — Procedures (Signed)
Interventional Radiology Procedure Note  Procedure: Port placement.  Indication: Esophageal Ca  Findings: Please refer to procedural dictation for full description.  Complications: None  EBL: < 10 mL  Miachel Roux, MD [phone]

## 2020-12-10 ENCOUNTER — Other Ambulatory Visit (HOSPITAL_COMMUNITY): Payer: Self-pay

## 2020-12-10 ENCOUNTER — Encounter: Payer: Self-pay | Admitting: Hematology

## 2020-12-10 ENCOUNTER — Encounter: Payer: Self-pay | Admitting: Physician Assistant

## 2020-12-10 ENCOUNTER — Other Ambulatory Visit: Payer: Self-pay

## 2020-12-10 DIAGNOSIS — K920 Hematemesis: Secondary | ICD-10-CM

## 2020-12-10 MED ORDER — PROCHLORPERAZINE MALEATE 10 MG PO TABS
10.0000 mg | ORAL_TABLET | Freq: Four times a day (QID) | ORAL | 2 refills | Status: DC | PRN
  Filled 2020-12-10: qty 60, 15d supply, fill #0
  Filled 2021-01-03: qty 60, 15d supply, fill #1

## 2020-12-10 NOTE — Telephone Encounter (Signed)
Pt has 2 refills on his Compazine already in Epic.

## 2020-12-12 ENCOUNTER — Other Ambulatory Visit: Payer: Self-pay | Admitting: Hematology

## 2020-12-12 DIAGNOSIS — C154 Malignant neoplasm of middle third of esophagus: Secondary | ICD-10-CM

## 2020-12-12 MED ORDER — MORPHINE SULFATE ER 30 MG PO TBCR
30.0000 mg | EXTENDED_RELEASE_TABLET | Freq: Three times a day (TID) | ORAL | 0 refills | Status: DC
  Filled 2020-12-12: qty 90, 30d supply, fill #0

## 2020-12-13 ENCOUNTER — Other Ambulatory Visit (HOSPITAL_COMMUNITY): Payer: Self-pay

## 2020-12-13 ENCOUNTER — Encounter: Payer: Self-pay | Admitting: Physician Assistant

## 2020-12-13 ENCOUNTER — Encounter: Payer: Self-pay | Admitting: Hematology

## 2020-12-14 ENCOUNTER — Inpatient Hospital Stay: Payer: Self-pay | Attending: Hematology

## 2020-12-14 ENCOUNTER — Encounter: Payer: Self-pay | Admitting: Hematology

## 2020-12-14 ENCOUNTER — Other Ambulatory Visit: Payer: Self-pay

## 2020-12-14 ENCOUNTER — Encounter: Payer: Self-pay | Admitting: Physician Assistant

## 2020-12-14 ENCOUNTER — Inpatient Hospital Stay: Payer: Self-pay

## 2020-12-14 ENCOUNTER — Other Ambulatory Visit (HOSPITAL_COMMUNITY): Payer: Self-pay

## 2020-12-14 ENCOUNTER — Inpatient Hospital Stay (HOSPITAL_BASED_OUTPATIENT_CLINIC_OR_DEPARTMENT_OTHER): Payer: Self-pay | Admitting: Hematology

## 2020-12-14 VITALS — BP 124/88 | HR 121 | Temp 99.0°F | Resp 18 | Ht 67.0 in | Wt 153.0 lb

## 2020-12-14 VITALS — HR 104

## 2020-12-14 DIAGNOSIS — R634 Abnormal weight loss: Secondary | ICD-10-CM | POA: Insufficient documentation

## 2020-12-14 DIAGNOSIS — Z95828 Presence of other vascular implants and grafts: Secondary | ICD-10-CM

## 2020-12-14 DIAGNOSIS — Z79899 Other long term (current) drug therapy: Secondary | ICD-10-CM | POA: Insufficient documentation

## 2020-12-14 DIAGNOSIS — G893 Neoplasm related pain (acute) (chronic): Secondary | ICD-10-CM | POA: Insufficient documentation

## 2020-12-14 DIAGNOSIS — C154 Malignant neoplasm of middle third of esophagus: Secondary | ICD-10-CM

## 2020-12-14 DIAGNOSIS — Z5189 Encounter for other specified aftercare: Secondary | ICD-10-CM | POA: Insufficient documentation

## 2020-12-14 DIAGNOSIS — Z5111 Encounter for antineoplastic chemotherapy: Secondary | ICD-10-CM | POA: Insufficient documentation

## 2020-12-14 DIAGNOSIS — R1310 Dysphagia, unspecified: Secondary | ICD-10-CM | POA: Insufficient documentation

## 2020-12-14 DIAGNOSIS — F1020 Alcohol dependence, uncomplicated: Secondary | ICD-10-CM | POA: Insufficient documentation

## 2020-12-14 DIAGNOSIS — C7951 Secondary malignant neoplasm of bone: Secondary | ICD-10-CM | POA: Insufficient documentation

## 2020-12-14 DIAGNOSIS — Z923 Personal history of irradiation: Secondary | ICD-10-CM | POA: Insufficient documentation

## 2020-12-14 DIAGNOSIS — R531 Weakness: Secondary | ICD-10-CM | POA: Insufficient documentation

## 2020-12-14 LAB — COMPREHENSIVE METABOLIC PANEL
ALT: 9 U/L (ref 0–44)
AST: 21 U/L (ref 15–41)
Albumin: 3.3 g/dL — ABNORMAL LOW (ref 3.5–5.0)
Alkaline Phosphatase: 93 U/L (ref 38–126)
Anion gap: 10 (ref 5–15)
BUN: 5 mg/dL — ABNORMAL LOW (ref 6–20)
CO2: 25 mmol/L (ref 22–32)
Calcium: 9.2 mg/dL (ref 8.9–10.3)
Chloride: 102 mmol/L (ref 98–111)
Creatinine, Ser: 0.59 mg/dL — ABNORMAL LOW (ref 0.61–1.24)
GFR, Estimated: >60 mL/min (ref >60)
Glucose, Bld: 120 mg/dL — ABNORMAL HIGH (ref 70–99)
Potassium: 3.6 mmol/L (ref 3.5–5.1)
Sodium: 137 mmol/L (ref 135–145)
Total Bilirubin: 0.6 mg/dL (ref 0.3–1.2)
Total Protein: 7.2 g/dL (ref 6.5–8.1)

## 2020-12-14 LAB — CBC WITH DIFFERENTIAL/PLATELET
Abs Immature Granulocytes: 0.03 10*3/uL (ref 0.00–0.07)
Basophils Absolute: 0 10*3/uL (ref 0.0–0.1)
Basophils Relative: 0 %
Eosinophils Absolute: 0.1 10*3/uL (ref 0.0–0.5)
Eosinophils Relative: 2 %
HCT: 32.6 % — ABNORMAL LOW (ref 39.0–52.0)
Hemoglobin: 11 g/dL — ABNORMAL LOW (ref 13.0–17.0)
Immature Granulocytes: 1 %
Lymphocytes Relative: 16 %
Lymphs Abs: 0.6 10*3/uL — ABNORMAL LOW (ref 0.7–4.0)
MCH: 32.9 pg (ref 26.0–34.0)
MCHC: 33.7 g/dL (ref 30.0–36.0)
MCV: 97.6 fL (ref 80.0–100.0)
Monocytes Absolute: 0.8 10*3/uL (ref 0.1–1.0)
Monocytes Relative: 20 %
Neutro Abs: 2.4 10*3/uL (ref 1.7–7.7)
Neutrophils Relative %: 61 %
Platelets: 162 10*3/uL (ref 150–400)
RBC: 3.34 MIL/uL — ABNORMAL LOW (ref 4.22–5.81)
RDW: 14.1 % (ref 11.5–15.5)
WBC: 3.9 10*3/uL — ABNORMAL LOW (ref 4.0–10.5)
nRBC: 0 % (ref 0.0–0.2)

## 2020-12-14 MED ORDER — SODIUM CHLORIDE 0.9 % IV SOLN
75.0000 mg/m2 | Freq: Once | INTRAVENOUS | Status: AC
  Administered 2020-12-14: 140 mg via INTRAVENOUS
  Filled 2020-12-14: qty 14

## 2020-12-14 MED ORDER — SODIUM CHLORIDE 0.9 % IV SOLN: Freq: Once | INTRAVENOUS | Status: AC

## 2020-12-14 MED ORDER — HEPARIN SOD (PORK) LOCK FLUSH 100 UNIT/ML IV SOLN
500.0000 [IU] | Freq: Once | INTRAVENOUS | Status: AC | PRN
  Administered 2020-12-14: 500 [IU]

## 2020-12-14 MED ORDER — SODIUM CHLORIDE 0.9 % IV SOLN
10.0000 mg | Freq: Once | INTRAVENOUS | Status: AC
  Administered 2020-12-14: 10 mg via INTRAVENOUS
  Filled 2020-12-14: qty 10

## 2020-12-14 MED ORDER — GABAPENTIN 300 MG PO CAPS
300.0000 mg | ORAL_CAPSULE | Freq: Three times a day (TID) | ORAL | 0 refills | Status: DC
  Filled 2020-12-14: qty 90, 30d supply, fill #0

## 2020-12-14 MED ORDER — DEXAMETHASONE 4 MG PO TABS
4.0000 mg | ORAL_TABLET | Freq: Two times a day (BID) | ORAL | 1 refills | Status: DC
  Filled 2020-12-14: qty 15, 8d supply, fill #0

## 2020-12-14 MED ORDER — SODIUM CHLORIDE 0.9% FLUSH
10.0000 mL | INTRAVENOUS | Status: DC | PRN
  Administered 2020-12-14: 10 mL

## 2020-12-14 MED ORDER — SODIUM CHLORIDE 0.9 % IV SOLN
16.0000 mg | Freq: Once | INTRAVENOUS | Status: AC
  Administered 2020-12-14: 16 mg via INTRAVENOUS
  Filled 2020-12-14: qty 8

## 2020-12-14 MED ORDER — SODIUM CHLORIDE 0.9% FLUSH
10.0000 mL | INTRAVENOUS | Status: DC | PRN
  Administered 2020-12-14: 10 mL via INTRAVENOUS

## 2020-12-14 NOTE — Patient Instructions (Signed)
[facility] MEDICAL ONCOLOGY  Discharge Instructions: ?Thank you for choosing [facility] to provide your oncology and hematology care.  ? ?If you have a lab appointment with the Cancer Center, please go directly to the Cancer Center and check in at the registration area. ?  ?Wear comfortable clothing and clothing appropriate for easy access to any Portacath or PICC line.  ? ?We strive to give you quality time with your provider. You may need to reschedule your appointment if you arrive late (15 or more minutes).  Arriving late affects you and other patients whose appointments are after yours.  Also, if you miss three or more appointments without notifying the office, you may be dismissed from the clinic at the provider?s discretion.    ?  ?For prescription refill requests, have your pharmacy contact our office and allow 72 hours for refills to be completed.   ? ?Today you received the following chemotherapy and/or immunotherapy agents : Taxotere    ?  ?To help prevent nausea and vomiting after your treatment, we encourage you to take your nausea medication as directed. ? ?BELOW ARE SYMPTOMS THAT SHOULD BE REPORTED IMMEDIATELY: ?*FEVER GREATER THAN 100.4 F (38 ?C) OR HIGHER ?*CHILLS OR SWEATING ?*NAUSEA AND VOMITING THAT IS NOT CONTROLLED WITH YOUR NAUSEA MEDICATION ?*UNUSUAL SHORTNESS OF BREATH ?*UNUSUAL BRUISING OR BLEEDING ?*URINARY PROBLEMS (pain or burning when urinating, or frequent urination) ?*BOWEL PROBLEMS (unusual diarrhea, constipation, pain near the anus) ?TENDERNESS IN MOUTH AND THROAT WITH OR WITHOUT PRESENCE OF ULCERS (sore throat, sores in mouth, or a toothache) ?UNUSUAL RASH, SWELLING OR PAIN  ?UNUSUAL VAGINAL DISCHARGE OR ITCHING  ? ?Items with * indicate a potential emergency and should be followed up as soon as possible or go to the Emergency Department if any problems should occur. ? ?Please show the CHEMOTHERAPY ALERT CARD or IMMUNOTHERAPY ALERT CARD at check-in to  the Emergency Department and triage nurse. ? ?Should you have questions after your visit or need to cancel or reschedule your appointment, please contact [facility] MEDICAL ONCOLOG[facility]  Dept: [phone]  and follow the prompts.  Office hours are 8:00 a.m. to 4:30 p.m. Monday - Friday. Please note that voicemails left after 4:00 p.m. may not be returned until the following business day.  We are closed weekends and major holidays. You have access to a nurse at all times for urgent questions. Please call the main number to the clinic Dept: [phone] and follow the prompts. ? ? ?For any non-urgent questions, you may also contact your provider using MyChart. We now offer e-Visits for anyone 18 and older to request care online for non-urgent symptoms. For details visit [email or web address]. ?  ?Also download the MyChart app! Go to the app store, search "MyChart", open the app, select [facility], and log in with your MyChart username and password. ? ?Due to Covid, a mask is required upon entering the hospital/clinic. If you do not have a mask, one will be given to you upon arrival. For doctor visits, patients may have 1 support person aged 18 or older with them. For treatment visits, patients cannot have anyone with them due to current Covid guidelines and our immunocompromised population.  ? ?

## 2020-12-14 NOTE — Progress Notes (Signed)
[place]   [phone] [phone]   Clinic Follow up Note   Patient Care Team: [name], MD as PCP - General (Hematology) [name], MD as Consulting Physician (Oncology)  Date of Service:  12/14/2020  CHIEF COMPLAINT: f/u of metastatic esophageal cancer  CURRENT THERAPY:  Docetaxel, starting 11/23/20  ASSESSMENT & PLAN:  Jimmy Hawkins is a 45 y.o. male with   1. 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 08/11/19 CT CAP found him to have mid esophageal mass and enlarged AP window node, no distant metastasis. -He completed 6 weeks of concurrent chemoRT with weekly Carboplatin and Taxol 08/29/19-10/06/19 -Repeat PET scan 10/2019 showed hypermetabolic wall thickening of the distal half of the thoracic esophageus, no notable distant metastasis.  -He received CAPOX q3weeks with Xeloda 1525m BID 2 weeks on/1 week off 12/20/19-02/2020. -He completed palliative Radiation to sacrum with Dr MLisbeth Renshaw2/28/22-3/15/22.  -He started First-line single agent Keytruda 04/27/20. Due to his slow improvement and squamous cell histology, we added chemo with carboplatin and Taxol 2 weeks on/1 week off from C4 on 06/29/20.  -restaging PET 11/08/20 showed worsening diease to the left sacral lesion and mild increase in activity to esophageal mass. No new metastasis. -he switched to docetaxel on 11/23/20. He tolerated cycle 1 well; his pain is stable. He notes he did not take the dexamethasone yesterday as he ran out (recall there was confusion is in his medications, which we reviewed last time). I refilled today  -he had port placed on 12/07/20 -lab reviewed, adequate for treatment, will proceed to cycle 2 docetaxel today   2. Symptom Management: Dysphagia and weight loss -He continues to have difficulty swallowing, and as  a result has lost weight. -he is undergoing esophageal dilation under Dr. GCarlean Purl most recently 11/20/20 and repeat scheduled 12/24/20. -he reports increased appetite on mirtazapine.   3. Sacral/back pain, LE weakness, secondary to bone metastasis -He was seen to have bone mets on 04/11/20 PET scan. -He completed palliative Radiation to sacrum with Dr MLisbeth Renshaw2/28/22-3/15/22. He was given short course dexa 441m  -restaging PET 11/08/20 showed continued worsening to left sacral metastatic lesion with erosion across SI joint into left iliac bone, left sacrum, and sacral spinal canal. -He is currently taking hydrocodone 10 cc and MS Contin 30 mg q8hr. He stopped the oxycodone as he did not feel it was helping. Pain is 6/10 today. -I previously prescribed gabapentin in 06/2020. He notes he took it and ran out. I will refill for him to try again.   4. Goal of care discussion, DNR  -We previously discussed the incurable nature of his cancer, and the overall poor prognosis, especially if he does not have good response to chemotherapy or progress on chemo -The patient understands the goal of care is palliative. -I recommend DNR/DNI, he agreed 11/09/20   5. H/o Heavy alcohol user -He quit drinking since cancer diagnosis, but started again. Due to weakness, he is not able to drink as much since 04/13/20 and stopped overall 06/29/20 -on 10/12/20, he reported he is back to drinking 5 beers a day. I previously advised him to abstain for the day of and a few following chemo.   6. Social and Financial Support  -Patient does not have insurance and is not currently working. He has already applied for Medicaid. -He has met with financial advocate  Shauna and SW and he has grant assistance with medications.      PLAN: -proceed with C2 docetaxel today             -udenyca on day 3 (Monday) -I will see if our palliative care nurse can see him while he is here on Monday for pain management  -I will refill his  dexamethasone and gabapentin -lab, flush, f/u, and C3 in 3 weeks   No problem-specific Assessment & Plan notes found for this encounter.   SUMMARY OF ONCOLOGIC HISTORY: Oncology History Overview Note  Cancer Staging Malignant neoplasm of middle third of esophagus ([place]) Staging form: Esophagus - Squamous Cell Carcinoma, AJCC 8th Edition - Clinical: Stage Unknown (cTX, cN1, cM0) - Signed by [name], MD on 08/18/2019    Malignant neoplasm of middle third of esophagus [facility])   Initial Diagnosis   Malignant neoplasm of middle third of esophagus ([place])   08/11/2019 Imaging   CT CAP W contrast 08/11/19 IMPRESSION: 1. Mid to lower esophageal mass along an 8 cm segment, large endoluminal component, strongly favoring esophageal malignancy. Borderline enlarged AP window lymph node. No findings of distant metastatic disease. 2. Trace bilateral pleural effusions. 3. Diffuse hepatic steatosis. 4. Cholelithiasis. 5. Fatty spermatic cords likely due to small bilateral indirect inguinal hernias.   08/11/2019 Procedure   EGD by Dr Carlean Purl  IMPRESSION - Partially obstructing, likely malignant esophageal tumor was found in the upper third of the esophagus and in the middle third of the esophagus. Biopsied. Very large and long - difficult but able to advance scope by this lesion - await CT for better length estimate - Normal stomach. - Normal examined duodenum.   08/11/2019 Initial Biopsy   FINAL MICROSCOPIC DIAGNOSIS:   A. ESOPHAGUS, UPPER, BIOPSY:  - Squamous cell carcinoma.    08/11/2019 Genetic Testing   PD-L1 Expression 90%   08/18/2019 Cancer Staging   Staging form: Esophagus - Squamous Cell Carcinoma, AJCC 8th Edition - Clinical: Stage Unknown (cTX, cN1, cM0) - Signed by [name], MD on 08/18/2019    08/29/2019 - 10/04/2019 Chemotherapy   Concurrent chemoradiation with weekly CT for 6 weeks starting 08/29/19-10/04/19    08/29/2019 - 10/06/2019 Radiation Therapy   Concurrent chemoradiation  with Dr Lisbeth Renshaw starting 08/29/19-10/06/19   11/11/2019 PET scan   IMPRESSION: 1. Again seen is a long segment of FDG avid wall thickening of the distal half of the thoracic esophagus compatible with known esophageal neoplasm. 2. No signs of FDG avid nodal or distant metastatic disease.     11/28/2019 Procedure   EUS by Dr Rush Landmark IMPRESSION EGD Impression: - No gross lesions in esophagus proximally. - Previously noted esophageal cancer is not present. However, in its place and extending distal to this is likely LA Grade D chemotherapy/radiation esophagitis with bleeding. Biopsied after the EUS was complete to evaluate for persistent disease or not. - Z-line regular, 37 cm from the incisors. - 3 cm hiatal hernia. - Erythematous mucosa in the stomach. No other gross lesions in the stomach. Biopsied. - No gross lesions in the duodenal bulb, in the first portion of the duodenum and in the second portion of the duodenum. EUS Impression: - Wall thickening was seen in the thoracic esophagus into the gastroesophageal junction. The thickening appeared to primarily be within the luminal interface/superficial mucosa (Layer 1) and deep mucosa (Layer 2). The previously noted esophageal cancer has been replaced by what appears to be radiation/chemotherapy induced changes. With the limitations of having to use  the mini-probe EUS, I could not visualize persistent significant disease other than the entire distal area of the esophagus having likely changes noted as above. - No malignant-appearing lymph nodes were visualized in the middle paraesophageal mediastinum (level 66M), lower paraesophageal mediastinum (level 8L), diaphragmatic region (level 15) and paracardial region (level 16).      FINAL MICROSCOPIC DIAGNOSIS:   A. STOMACH, BIOPSY:  - Mildly active chronic gastritis.  - Warthin-Starry negative for Helicobacter pylori.  - No intestinal metaplasia, dysplasia or carcinoma.   B.  ESOPHAGUS, BIOPSY:  - Inflamed granulation tissue and exudate consistent with ulcer.  - No malignancy identified.    12/20/2019 - 02/2020 Chemotherapy   CAPOX q3weeks with Xeloda 1531m BID 2 weeks on/1 week off starting 12/20/19-02/2020   03[facility]21/2020 [facility]   [facility]Circumferential thickening of the lower third of the esophagus which corresponds with known esophageal malignancy. No evidence of metastatic disease within the chest.  --For findings below the diaphragm, please see concurrent separately reported CT of the abdomen and pelvis.  --Expansile lytic mass with associated soft tissue component centered in the left sacral ala and abutting the left S1 and S2 nerve roots, left internal iliac vasculature, and left pyriformis muscle with no other metastatic disease within the abdomen or pelvis.   --Diffuse thickening of the distal esophagus compatible with known esophageal malignancy.   --Cholelithiasis with nonspecific gallbladder distention. Clinical correlation is recommended. If there is clinical concern for cholecystitis, further evaluation with ultrasound can be obtained.   03/21/2020 Imaging   CT Lumbar Spine  Expansile lytic lesion centered in the left sacral ala with no other evidence of metastatic disease in the lumbar spine.   03/26/2020 - 04/10/2020 Radiation Therapy   Palliative Radiation to Sacrum with Dr MLisbeth Renshaw2/28/22-3/15/22.    04/11/2020 Progression   PET  IMPRESSION: 1. New hypermetabolic expansile 6.1 cm upper left sacral lytic bone metastasis, centrally necrotic. 2. New hypermetabolic left level 1b and left level 2 neck lymph nodes, indeterminate for reactive versus metastatic nodes. 3. Persistent hypermetabolism associated with a short segment of mid to lower thoracic esophagus with associated circumferential esophageal wall thickening, similar in metabolism and decreased in wall thickness since 11/11/2019 PET-CT, nonspecific, favor post treatment change,  with residual neoplasm not excluded. 4. No additional sites of hypermetabolic metastatic disease. 5.  Chronic findings include: Cholelithiasis.   04/27/2020 -  Chemotherapy   First-line with immunotherapy Keytruda q3weeks starting 04/27/20       06/29/2020 -  Chemotherapy   Weekly carbo AUC 1.5 and Taxol 638mm2 on day 1, 8 every 21 days added to first-line Keytruda from C4 on 06/29/20.     07/17/2020 PET scan   IMPRESSION: 1. Left sacral metastasis is slightly enlarged and slightly increased in peripheral hypermetabolism. No additional evidence of metastatic disease. 2. Similar mild hypermetabolism associated with the mid esophagus. No residual hypermetabolism within left neck lymph nodes. 3. Small pericardial effusion. 4. Trace bilateral pleural effusions. 5. Cholelithiasis.     09/14/2020 Procedure   Upper Endoscopy  Impression: - Esophagitis. Biopsied. - Esophageal stenosis. Dilated. Biopsied. this looks most like a mid-distal inflammatory stricture (? post XRT +/- reflux, Candida?) scope passed through but styill felt gerip on scope so did not advance deeper into stomach to avoid trauma. - Normal cardia.    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 - NO EVIDENCE OF DYSPLASIA OR MALIGNANCY. - SEE MICROSCOPIC DESCRIPTION. Microscopic Comment  While histologic changes of radiation effects are not apparent in the inflamed granulation tissue, the presence of ulceration can be indicative of radiation effects.   11/08/2020 PET scan   IMPRESSION: 1. Substantial (about 70%) increase in volume of the left sacral metastatic lesion which has central necrosis and peripheral hypermetabolic activity. This lesion now has increased erosion across the SI joint into the left iliac bone and into the left sacrum and sacral spinal canal. 2. Mild increase in activity in the thickened segment of mid to distal esophagus  corresponding to the original mass, current maximum SUV 6.2 (formerly 4.6). 3. Stable small posterior pericardial effusion.   11/23/2020 -  Chemotherapy   Patient is on Treatment Plan : LUNG Docetaxel q21d        INTERVAL HISTORY:  Jimmy Hawkins is here for a follow up of metastatic esophageal cancer. He was last seen by me on 11/23/20. He presents to the clinic accompanied by his wife and our interpreter Almyra Free. He reports he tolerated his last chemo treatment very well. He continues to have pain from the bone metastases, which he notes is stable. He rates the pain at 6/10. He is more comfortable standing (with walker) or laying down, so he stood for most of our appointment today. I asked if he is still taking the gabapentin, and he reports he took the initial prescription but ran out. He notes he honestly doesn't remember if it helped or not.   All other systems were reviewed with the patient and are negative.  MEDICAL HISTORY:  Past Medical History:  Diagnosis Date   Blood transfusion without reported diagnosis    Cramps of left lower extremity 2021   Hematemesis with nausea 08/10/2019   Squamous cell carcinoma, esophagus ([place]) 04/2019   metastatic    SURGICAL HISTORY: Past Surgical History:  Procedure Laterality Date   BIOPSY  08/11/2019   Procedure: BIOPSY;  Surgeon: [name], MD;  [facility] END[facility]COPY;  Service: Endoscopy;;   BIOPSY  11/28/2019   Procedure: BIOPSY;  Surgeon: [name], MD;  Location[facility] ENDOSCOPY;  Service: Gastroenterology;;   ESOPHAGOGASTRODUODENOSCOPY  08/11/2019   ESOPHAGOGASTRODUODENOSCOPY (EGD) WITH PROPOFOL N/A 08/11/2019   Procedure: ESOPHAGOGASTRODUODENOSCOPY (EGD) WITH PROPOFOL;  Surgeon: [name], MD;  Location: [place];  Service: Endoscopy;  Laterality: N/A;   ESOPHAGOGASTRODUODENOSCOPY (EGD) WITH PROPOFOL N/A 11/28/2019   Procedure: ESOPHAGOGASTRODUODENOSCOPY (EGD) WITH PROPOFOL;  Surgeon: Rush Landmark  Telford Nab., MD;  Location: [place];  Service: Gastroenterology;  Laterality: N/A;   EUS N/A 11/28/2019   Procedure: UPPER ENDOSCOPIC ULTRASOUND (EUS) RADIAL;  Surgeon: [name], MD;  Location: [place];  Service: Gastroenterology;  Laterality: N/A;   IR IMAGING GUIDED PORT INSERTION  12/07/2020    I have reviewed the social history and family history with the patient and they are unchanged from previous note.  ALLERGIES:  has No Known Allergies.  MEDICATIONS:  Current Outpatient Medications  Medication Sig Dispense Refill   ciprofloxacin (CIPRO) 500 MG tablet Take 1 tablet by mouth 2 (two) times daily. 10 tablet 0   dexamethasone (DECADRON) 4 MG tablet Take 1 tablet (4 mg total) by mouth 2 (two) times daily. Start the day before Taxotere. Then daily after chemo for 2 days. 15 tablet 1   diphenhydrAMINE-APAP, sleep, (TYLENOL PM EXTRA STRENGTH) 50-1000 MG/30ML LIQD Take 15 mLs by mouth at bedtime as needed (sleep).     famotidine (PEPCID) 20 MG tablet TOME 1 PASTILLA POR VIA ORAL DOS VECES AL  DIA 60 tablet 3   Ferrous Sulfate (IRON) 325 (65 Fe) MG TABS Take 1 tablet (325 mg total) by mouth 2 (two) times daily. 60 tablet 3   ferrous sulfate 325 (65 FE) MG tablet TAKE 1 TABLET BY MOUTH 2 TIMES DAILY. 60 tablet 3   gabapentin (NEURONTIN) 300 MG capsule Take 1 capsule (300 mg total) by mouth 3 (three) times daily. 90 capsule 0   HYDROcodone-acetaminophen (HYCET) 7.5-325 mg/15 ml solution Take 10 mLs by mouth every 6 (six) hours as needed for moderate pain. 473 mL 0   mirtazapine (REMERON) 15 MG tablet Take 1 tablet (15 mg total) by mouth at bedtime. 30 tablet 2   morphine (MS CONTIN) 30 MG 12 hr tablet Take 1 tablet by mouth every 8  hours. 90 tablet 0   oxyCODONE (ROXICODONE) 5 MG/5ML solution Take 5 mLs (5 mg total) by mouth every 6 (six) hours as needed for severe pain. OK to refill on 11/12/2020 473 mL 0   pantoprazole (PROTONIX) 40 MG tablet Take 1 tablet (40 mg total)  by mouth 2 (two) times daily before a meal. 60 tablet 3   prochlorperazine (COMPAZINE) 10 MG tablet Take 1 tablet by mouth every 6 hours as needed for nausea or vomiting. 60 tablet 2   sucralfate (CARAFATE) 1 g tablet Take 1 tablet by mouth 4 times daily. (Dissolve in 15 mls of water before taking) 120 tablet 2   No current facility-administered medications for this visit.   Facility-Administered Medications Ordered in Other Visits  Medication Dose Route Frequency Provider Last Rate Last Admin   diphenhydrAMINE (BENADRYL) 50 MG/ML injection            diphenhydrAMINE (BENADRYL) 50 MG/ML injection            famotidine (PEPCID) 20 mg IVPB            famotidine (PEPCID) 20 mg IVPB            palonosetron (ALOXI) 0.25 MG/5ML injection            palonosetron (ALOXI) 0.25 MG/5ML injection            sodium chloride flush (NS) 0.9 % injection 10 mL  10 mL Intracatheter PRN [name], MD   10 mL at 12/14/20 1516    PHYSICAL EXAMINATION: ECOG PERFORMANCE STATUS: 2 - Symptomatic, <50% confined to bed  Vitals:   12/14/20 1131  BP: 124/88  Pulse: (!) 121  Resp: 18  Temp: 99 F (37.2 C)  SpO2: 98%   Wt Readings from Last 3 Encounters:  12/14/20 153 lb (69.4 kg)  11/23/20 156 lb 12.8 oz (71.1 kg)  11/20/20 150 lb (68 kg)     GENERAL:alert, no distress and comfortable SKIN: skin color normal, no rashes or significant lesions EYES: normal, Conjunctiva are pink and non-injected, sclera clear  NEURO: alert & oriented x 3 with fluent speech  LABORATORY DATA:  I have reviewed the data as listed CBC Latest Ref Rng & Units 12/14/2020 11/23/2020 11/09/2020  WBC 4.0 - 10.5 K/uL 3.9(L) 8.9 2.5(L)  Hemoglobin 13.0 - 17.0 g/dL 11.0(L) 12.6(L) 11.6(L)  Hematocrit 39.0 - 52.0 % 32.6(L) 36.6(L) 32.8(L)  Platelets 150 - 400 K/uL 162 161 220     CMP Latest Ref Rng & Units 12/14/2020 11/23/2020 11/09/2020  Glucose 70 - 99 mg/dL 120(H) 100(H) 97  BUN 6 - 20 mg/dL 5(L) 13 <5(L)  Creatinine 0.61 -  1.24 mg/dL 0.59(L) 0.61 0.37(L)  Sodium 135 -  145 mmol/L 137 135 137  Potassium 3.5 - 5.1 mmol/L 3.6 3.4(L) 3.7  Chloride 98 - 111 mmol/L 102 101 102  CO2 22 - 32 mmol/L _0 Calcium 8.9 - 10.3 mg/dL 9.2 8.4(L) 9.1  Total Protein 6.5 - 8.1 g/dL 7.2 6.4(L) 8.2(H)  Total Bilirubin 0.3 - 1.2 mg/dL 0.6 0.5 0.4  Alkaline Phos 38 - 126 U/L 93 83 77  AST 15 - 41 U/L 21 32 32  ALT 0 - 44 U/L 9 40 15      RADIOGRAPHIC STUDIES: I have personally reviewed the radiological images as listed and agreed with the findings in the report. No results found.    Orders Placed This Encounter  Procedures   Amb Referral to Palliative Care    Referral Priority:   Routine    Referral Type:   Consultation    Referral Reason:   Symptom Managment    Number of Visits Requested:   1   All questions were answered. The patient knows to call the clinic with any problems, questions or concerns. No barriers to learning was detected. The total time spent in the appointment was 30 minutes.     [name], MD 12/14/2020   I, Wilburn Mylar, am acting as scribe for [name], MD.   I have reviewed the above documentation for accuracy and completeness, and I agree with the above.

## 2020-12-14 NOTE — Progress Notes (Signed)
Per Dr. Burr Medico, "OK To Treat w/elevated HR".

## 2020-12-17 ENCOUNTER — Inpatient Hospital Stay: Payer: Self-pay

## 2020-12-17 ENCOUNTER — Other Ambulatory Visit: Payer: Self-pay

## 2020-12-17 VITALS — BP 117/79 | HR 111 | Temp 98.8°F | Resp 18

## 2020-12-17 DIAGNOSIS — C154 Malignant neoplasm of middle third of esophagus: Secondary | ICD-10-CM

## 2020-12-17 MED ORDER — PEGFILGRASTIM-CBQV 6 MG/0.6ML ~~LOC~~ SOSY
6.0000 mg | PREFILLED_SYRINGE | Freq: Once | SUBCUTANEOUS | Status: AC
  Administered 2020-12-17: 6 mg via SUBCUTANEOUS
  Filled 2020-12-17: qty 0.6

## 2020-12-17 NOTE — Patient Instructions (Signed)

## 2020-12-21 ENCOUNTER — Encounter: Payer: Self-pay | Admitting: Physician Assistant

## 2020-12-21 ENCOUNTER — Other Ambulatory Visit: Payer: Self-pay | Admitting: Hematology

## 2020-12-21 ENCOUNTER — Encounter: Payer: Self-pay | Admitting: Hematology

## 2020-12-21 ENCOUNTER — Other Ambulatory Visit (HOSPITAL_COMMUNITY): Payer: Self-pay

## 2020-12-21 MED ORDER — HYDROCODONE-ACETAMINOPHEN 7.5-325 MG/15ML PO SOLN
10.0000 mL | Freq: Four times a day (QID) | ORAL | 0 refills | Status: DC | PRN
  Filled 2020-12-21: qty 473, 12d supply, fill #0

## 2020-12-24 ENCOUNTER — Encounter: Payer: Self-pay | Admitting: Internal Medicine

## 2020-12-24 ENCOUNTER — Ambulatory Visit (AMBULATORY_SURGERY_CENTER): Payer: Self-pay | Admitting: Internal Medicine

## 2020-12-24 VITALS — BP 107/79 | HR 106 | Temp 98.7°F | Resp 15 | Ht 67.0 in | Wt 150.0 lb

## 2020-12-24 DIAGNOSIS — K222 Esophageal obstruction: Secondary | ICD-10-CM

## 2020-12-24 DIAGNOSIS — R1310 Dysphagia, unspecified: Secondary | ICD-10-CM

## 2020-12-24 MED ORDER — SODIUM CHLORIDE 0.9 % IV SOLN: 500.0000 mL | Freq: Once | INTRAVENOUS | Status: DC

## 2020-12-24 NOTE — Patient Instructions (Addendum)
I stretched the esophagus to 17 mm. Last time it was 16.5 mm.  I think it is open enough so that you can come back as needed.  Please contact me if you think the swallowing is getting worse and you need more stretching.  I appreciate the opportunity to care for you. [name], MD, [facility]  Thank you for allowing [name] to take care of your healthcare needs.   USTED TUVO UN PROCEDIMIENTO ENDOSCPICO HOY EN EL [facility]:   Lea el informe del procedimiento que se le entreg para cualquier pregunta especfica sobre lo que se Primary school teacher.  Si el informe del examen no responde a sus preguntas, por favor llame a su gastroenterlogo para aclararlo.  Si usted solicit que no se le den [employer] de lo que se Estate manager/land agent en su procedimiento al [employer] va a cuidar, entonces el informe del procedimiento se ha incluido en un sobre sellado para que usted lo revise despus cuando le sea ms conveniente.   LO QUE PUEDE ESPERAR: Algunas sensaciones de hinchazn en el abdomen.  Puede tener ms gases de lo normal.  El caminar puede ayudarle a eliminar el aire que se le puso en el tracto gastrointestinal durante el procedimiento y reducir la hinchazn.  Si le hicieron una endoscopia inferior (como una colonoscopia o una sigmoidoscopia flexible), podra notar manchas de sangre en las heces fecales o en el papel higinico.  Si se someti a una preparacin intestinal para su procedimiento, es posible que no tenga una evacuacin intestinal normal durante [employer].   Tenga en cuenta:  Es posible que note un poco de irritacin y congestin en la nariz o algn drenaje.  Esto es debido al oxgeno [employer] durante su procedimiento.  No hay que preocuparse y esto debe desaparecer ms o Scientist, research (medical).     Despus de la endoscopia superior (EGD)  Vmitos de Biochemist, clinical o material como caf molido   Dolor en el pecho o dolor debajo de los omplatos que antes no tena   Dolor o dificultad  persistente para tragar  Falta de aire que antes no tena   Fiebre de 100F o ms  Heces fecales negras y pegajosas   Para asuntos urgentes o de Freight forwarder, puede comunicarse con un gastroenterlogo a cualquier hora llamando al [phone].  DIETA:  Recomendamos una comida pequea al principio, pero luego puede continuar con su dieta normal.  Tome muchos lquidos, Teacher, adult education las bebidas alcohlicas durante 24 horas.    ACTIVIDAD:  Debe planear tomarse las cosas con calma por el resto del da y no debe CONDUCIR ni usar maquinaria pesada Programmer, applications (debido a los medicamentos de sedacin utilizados durante el examen).     SEGUIMIENTO: Nuestro personal llamar al nmero que aparece en su historial al siguiente da hbil de su procedimiento para ver cmo se siente y para responder cualquier pregunta o inquietud que pueda tener con respecto a la informacin que se le dio despus del procedimiento. Si no podemos contactarle, le dejaremos un mensaje.  Sin embargo, si se siente bien y no tiene Paediatric nurse, no es necesario que nos devuelva la llamada.  Asumiremos que ha regresado a sus actividades diarias normales sin incidentes. Si se le tomaron algunas biopsias, le contactaremos por telfono o por carta en las prximas 3 semanas.  Si no ha sabido [employer] biopsias en el transcurso de 3 semanas, por favor llmenos al [phone].  FIRMAS/CONFIDENCIALIDAD: Usted y/o el acompaante que le cuide han firmado documentos que se ingresarn en su historial mdico electrnico.  Estas firmas atestiguan el hecho de que la informacin anterior

## 2020-12-24 NOTE — Progress Notes (Signed)
[facility] History and Physical   Primary Care Physician:  [name], MD   Reason for Procedure:   Dilate esophageal stricture  Plan:    Upper endoscopy and esophageal dilation     HPI: Jimmy Hawkins is a 45 y.o. 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 Last exam and Tx was 10/5 and 16.5 mm dilation max.  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    Past Surgical History:  Procedure Laterality Date   BIOPSY  08/11/2019   Procedure: BIOPSY;  Surgeon: [name], MD;  Location: [place];  Service: Endoscopy;;   BIOPSY  11/28/2019   Procedure: BIOPSY;  Surgeon: [name], MD;  Location: [place];  Service: Gastroenterology;;   ESOPHAGOGASTRODUODENOSCOPY  08/11/2019   ESOPHAGOGASTRODUODENOSCOPY (EGD) WITH PROPOFOL N/A 08/11/2019   Procedure: ESOPHAGOGASTRODUODENOSCOPY (EGD) WITH PROPOFOL;  Surgeon: [name], MD;  Location: [facility] ENDOSCOPY;  Service: Endoscopy;  Laterality: N/A;   ESOPHAGOGASTRODUODENOSCOPY (EGD) WITH PROPOFOL N/A 11/28/2019   Procedure: ESOPHAGOGASTRODUODENOSCOPY (EGD) WITH PROPOFOL;  Surgeon: Rush Landmark Telford Nab., MD;  Location: [place];  Service: Gastroenterology;  Laterality: N/A;   EUS N/A 11/28/2019   Procedure: UPPER ENDOSCOPIC ULTRASOUND (EUS) RADIAL;  Surgeon: [name], MD;  Location: [place];  Service: Gastroenterology;  Laterality: N/A;   IR IMAGING GUIDED PORT INSERTION  12/07/2020    Prior to Admission medications   Medication Sig Start Date End Date Taking? Authorizing Provider  gabapentin  (NEURONTIN) 300 MG capsule Take 1 capsule (300 mg total) by mouth 3 (three) times daily. 12/14/20  Yes [name], MD  HYDROcodone-acetaminophen (HYCET) 7.5-325 mg/15 ml solution Take 10 mLs by mouth every 6 (six) hours as needed for moderate pain. 12/21/20 06/19/21 Yes [name], MD  morphine (MS CONTIN) 30 MG 12 hr tablet Take 1 tablet by mouth every 8  hours. 12/12/20 06/10/21 Yes [name], MD  ciprofloxacin (CIPRO) 500 MG tablet Take 1 tablet by mouth 2 (two) times daily. 11/26/20   [name], MD  dexamethasone (DECADRON) 4 MG tablet Take 1 tablet (4 mg total) by mouth 2 (two) times daily. Start the day before Taxotere. Then daily after chemo for 2 days. 12/14/20   [name], MD  diphenhydrAMINE-APAP, sleep, (TYLENOL PM EXTRA STRENGTH) 50-1000 MG/30ML LIQD Take 15 mLs by mouth at bedtime as needed (sleep).    [provider]  famotidine (PEPCID) 20 MG tablet TOME 1 PASTILLA POR VIA ORAL DOS VECES AL DIA 12/20/19 12/19/20  [name], MD  Ferrous Sulfate (IRON) 325 (65 Fe) MG TABS Take 1 tablet (325 mg total) by mouth 2 (two) times daily. 07/20/20 10/03/20  [name], MD  ferrous sulfate 325 (65 FE) MG tablet TAKE 1 TABLET BY MOUTH 2 TIMES DAILY. 05/18/20 05/18/21  [name], MD  mirtazapine (REMERON) 15 MG tablet Take 1 tablet (15 mg total) by mouth at bedtime. 11/09/20   [name], MD  oxyCODONE (ROXICODONE) 5 MG/5ML solution Take 5 mLs (5 mg total) by mouth every 6 (six) hours as needed for severe pain. OK to refill on  11/12/2020 11/09/20   [name], MD  pantoprazole (PROTONIX) 40 MG tablet Take 1 tablet (40 mg total) by mouth 2 (two) times daily before a meal. 10/12/20   [name], MD  prochlorperazine (COMPAZINE) 10 MG tablet Take 1 tablet by mouth every 6 hours as needed for nausea or vomiting. 12/10/20 12/10/21  [name], MD  sucralfate (CARAFATE) 1 g tablet Take 1 tablet by mouth 4 times daily. (Dissolve in 15 mls of water before taking) 09/06/20   Levin Erp, PA  omeprazole  (PRILOSEC) 40 MG capsule Take 1 capsule (40 mg total) by mouth daily. 09/12/19 11/28/19  [name], MD    Current Outpatient Medications  Medication Sig Dispense Refill   gabapentin (NEURONTIN) 300 MG capsule Take 1 capsule (300 mg total) by mouth 3 (three) times daily. 90 capsule 0   HYDROcodone-acetaminophen (HYCET) 7.5-325 mg/15 ml solution Take 10 mLs by mouth every 6 (six) hours as needed for moderate pain. 473 mL 0   morphine (MS CONTIN) 30 MG 12 hr tablet Take 1 tablet by mouth every 8  hours. 90 tablet 0   ciprofloxacin (CIPRO) 500 MG tablet Take 1 tablet by mouth 2 (two) times daily. 10 tablet 0   dexamethasone (DECADRON) 4 MG tablet Take 1 tablet (4 mg total) by mouth 2 (two) times daily. Start the day before Taxotere. Then daily after chemo for 2 days. 15 tablet 1   diphenhydrAMINE-APAP, sleep, (TYLENOL PM EXTRA STRENGTH) 50-1000 MG/30ML LIQD Take 15 mLs by mouth at bedtime as needed (sleep).     famotidine (PEPCID) 20 MG tablet TOME 1 PASTILLA POR VIA ORAL DOS VECES AL DIA 60 tablet 3   Ferrous Sulfate (IRON) 325 (65 Fe) MG TABS Take 1 tablet (325 mg total) by mouth 2 (two) times daily. 60 tablet 3   ferrous sulfate 325 (65 FE) MG tablet TAKE 1 TABLET BY MOUTH 2 TIMES DAILY. 60 tablet 3   mirtazapine (REMERON) 15 MG tablet Take 1 tablet (15 mg total) by mouth at bedtime. 30 tablet 2   oxyCODONE (ROXICODONE) 5 MG/5ML solution Take 5 mLs (5 mg total) by mouth every 6 (six) hours as needed for severe pain. OK to refill on 11/12/2020 473 mL 0   pantoprazole (PROTONIX) 40 MG tablet Take 1 tablet (40 mg total) by mouth 2 (two) times daily before a meal. 60 tablet 3   prochlorperazine (COMPAZINE) 10 MG tablet Take 1 tablet by mouth every 6 hours as needed for nausea or vomiting. 60 tablet 2   sucralfate (CARAFATE) 1 g tablet Take 1 tablet by mouth 4 times daily. (Dissolve in 15 mls of water before taking) 120 tablet 2   Current Facility-Administered Medications  Medication Dose Route  Frequency Provider Last Rate Last Admin   0.9 %  sodium chloride infusion  500 mL Intravenous Once [name], MD       Facility-Administered Medications Ordered in Other Visits  Medication Dose Route Frequency Provider Last Rate Last Admin   diphenhydrAMINE (BENADRYL) 50 MG/ML injection            diphenhydrAMINE (BENADRYL) 50 MG/ML injection            famotidine (PEPCID) 20 mg IVPB            famotidine (PEPCID) 20 mg IVPB            palonosetron (ALOXI) 0.25 MG/5ML injection            palonosetron (ALOXI) 0.25 MG/5ML  injection             Allergies as of 12/24/2020   (No Known Allergies)    Family History  Problem Relation Age of Onset   Esophageal cancer Mother    Stomach cancer Mother    Healthy Father    Colon cancer Neg Hx    Rectal cancer Neg Hx     Social History   Socioeconomic History   Marital status: Single    Spouse name: Not on file   Number of children: Not on file   Years of education: Not on file   Highest education level: Not on file  Occupational History   Not on file  Tobacco Use   Smoking status: Never   Smokeless tobacco: Never  Vaping Use   Vaping Use: Never used  Substance and Sexual Activity   Alcohol use: Yes    Alcohol/week: 30.0 standard drinks    Types: 30 Cans of beer per week   Drug use: Never   Review of Systems:  All other review of systems negative except as mentioned in the HPI.  Physical Exam: Vital signs BP 128/89   Pulse (!) 111   Temp 98.7 F (37.1 C)   Resp (!) 9   Ht 5\' 7"  (1.702 m)   Wt 150 lb (68 kg)   SpO2 100%   BMI 23.49 kg/m   General:   Alert,  Well-developed, well-nourished, pleasant and cooperative in NAD Lungs:  Clear throughout to auscultation.   Heart:  Regular rate and rhythm; no murmurs, clicks, rubs,  or gallops. Abdomen:  Soft, nontender and nondistended. Normal bowel sounds.   Neuro/Psych:  Alert and cooperative. Normal mood and affect. A and O x 3   @Jireh Vinas  Simonne Maffucci, MD,  [facility] [phone] (pager) 12/24/2020 9:49 AM@

## 2020-12-24 NOTE — Progress Notes (Signed)
Called to room to assist during endoscopic procedure.  Patient ID and intended procedure confirmed with present staff. Received instructions for my participation in the procedure from the performing physician.  

## 2020-12-24 NOTE — Progress Notes (Signed)
VS by CW  Pt's states no medical or surgical changes since previsit or office visit.   Interpreter used today at the [facility] for this pt.  Interpreter's name is- Clinical research associate

## 2020-12-24 NOTE — Op Note (Signed)
[facility] Patient Name: Jimmy Hawkins Procedure Date: 12/24/2020 9:45 AM MRN: [id number] Endoscopist: [name] , MD Age: 45 Referring MD:  Date of Birth: 1975/07/11 Gender: Male Account #: [id number] Procedure:                Upper GI endoscopy Indications:              Dysphagia, Stricture of the esophagus, Follow-up of                            esophageal stricture, For therapy of esophageal                            stricture Medicines:                Propofol per Anesthesia, Monitored Anesthesia Care Procedure:                Pre-Anesthesia Assessment:                           - Prior to the procedure, a History and Physical                            was performed, and patient medications and                            allergies were reviewed. The patient's tolerance of                            previous anesthesia was also reviewed. The risks                            and benefits of the procedure and the sedation                            options and risks were discussed with the patient.                            All questions were answered, and informed consent                            was obtained. Prior Anticoagulants: The patient has                            taken no previous anticoagulant or antiplatelet                            agents. ASA Grade Assessment: III - A patient with                            severe systemic disease. After reviewing the risks                            and benefits, the patient was deemed in  satisfactory condition to undergo the procedure.                           After obtaining informed consent, the endoscope was                            passed under direct vision. Throughout the                            procedure, the patient's blood pressure, pulse, and                            oxygen saturations were monitored continuously. The                             Endoscope was introduced through the mouth, and                            advanced to the second part of duodenum. The upper                            GI endoscopy was accomplished without difficulty.                            The patient tolerated the procedure well. Scope In: Scope Out: Findings:                 One benign-appearing, intrinsic moderate                            (circumferential scarring or stenosis; an endoscope                            may pass) stenosis was found 34 to 36 cm from the                            incisors. This stenosis measured 1.4 cm (inner                            diameter) x 2 cm (in length). The stenosis was                            traversed. A TTS dilator was passed through the                            scope. Dilation with a 16-17-18 mm balloon dilator                            was performed to 17 mm. The dilation site was                            examined and showed moderate mucosal disruption.  Estimated blood loss was minimal.                           The gastroesophageal junction, cardia, gastric                            fundus and gastric body were normal.                           The cardia and gastric fundus were normal on                            retroflexion. Complications:            No immediate complications. Estimated Blood Loss:     Estimated blood loss was minimal. Impression:               - Benign-appearing esophageal stenosis. Dilated. 16                            and 17 mm.                           - Normal gastroesophageal junction, cardia, gastric                            fundus and gastric body.                           - No specimens collected. Recommendation:           - Patient has a contact number available for                            emergencies. The signs and symptoms of potential                            delayed complications were discussed with the                             patient. Return to normal activities tomorrow.                            Written discharge instructions were provided to the                            patient.                           - Clear liquids x 1 hour then soft foods rest of                            day. Start prior diet tomorrow.                           - Continue present medications.                           -  I think the stricture is open enough to leave                            follow-up as needed. [name], MD 12/24/2020 10:21:22 AM This report has been signed electronically.

## 2020-12-24 NOTE — Progress Notes (Signed)
Report to PACU, RN, vss, BBS= Clear.  

## 2020-12-26 ENCOUNTER — Telehealth: Payer: Self-pay

## 2020-12-26 ENCOUNTER — Telehealth: Payer: Self-pay | Admitting: *Deleted

## 2020-12-26 NOTE — Telephone Encounter (Signed)
Spoke with daughter and she states pain is similar, able to tolerate liquids and soft foods, and no fever or bleeding.    She is made aware to call back if pt does develop these issues and understanding voiced

## 2020-12-26 NOTE — Telephone Encounter (Signed)
  Follow up Call-  Call back number 12/24/2020 11/20/2020 10/31/2020 10/03/2020 09/19/2020 09/14/2020  Post procedure Call Back phone  # [phone] Verdis Frederickson (sister) [phone]  Phone comments - - phone # for Margie -niece - - -  Permission to leave phone message Yes Yes Yes - Yes Yes  Some recent data might be hidden     Patient questions:  Do you have a fever, pain , or abdominal swelling? Yes.   Pain Score  7 *  Have you tolerated food without any problems? See below  Have you been able to return to your normal activities? Yes.    Do you have any questions about your discharge instructions: Diet   No. Medications  No. Follow up visit  No.  Do you have questions or concerns about your Care? No.  Actions: * If pain score is 4 or above: Physician/ provider Notified : Silvano Rusk, MD  Dr. Carlean Purl,  I spoke with pt's daughter for his call back post procedure; she interpreted for him.  Pt states he is having "muscle pain in throat" rating as a "7."  This is not new for him- he states he does always have pain after EGD.  He is eating less this time d/t pain.  He states that walking around helps the pain.  He see his pallative care MD this Friday. You may be aware of this, but just wanted to let you know.  Please advise if you'd like me to do anything.  Thanks, [employer]

## 2020-12-26 NOTE — Telephone Encounter (Signed)
As long as this is similar to previous post-procedure pain, tolerating liquid or soft diet  and no fever or other problems like vomiting or bleeding would continue to monitor.  If this is not the case let me know.

## 2020-12-26 NOTE — Telephone Encounter (Signed)
  Follow up Call-  Call back number 12/24/2020 11/20/2020 10/31/2020 10/03/2020 09/19/2020 09/14/2020  Post procedure Call Back phone  # [phone] Verdis Frederickson (sister) [phone]  Phone comments - - phone # for Margie -niece - - -  Permission to leave phone message Yes Yes Yes - Yes Yes  Some recent data might be hidden     Call attempted, no answer,left msg

## 2020-12-28 ENCOUNTER — Encounter: Payer: Self-pay | Admitting: Physician Assistant

## 2020-12-28 ENCOUNTER — Encounter: Payer: Self-pay | Admitting: Hematology

## 2020-12-28 ENCOUNTER — Inpatient Hospital Stay: Payer: Self-pay | Attending: Hematology | Admitting: Nurse Practitioner

## 2020-12-28 ENCOUNTER — Other Ambulatory Visit (HOSPITAL_COMMUNITY): Payer: Self-pay

## 2020-12-28 ENCOUNTER — Other Ambulatory Visit: Payer: Self-pay

## 2020-12-28 VITALS — BP 135/87 | HR 128 | Temp 99.0°F | Resp 20 | Ht 67.0 in | Wt 152.9 lb

## 2020-12-28 DIAGNOSIS — G893 Neoplasm related pain (acute) (chronic): Secondary | ICD-10-CM | POA: Insufficient documentation

## 2020-12-28 DIAGNOSIS — Z515 Encounter for palliative care: Secondary | ICD-10-CM

## 2020-12-28 DIAGNOSIS — Z923 Personal history of irradiation: Secondary | ICD-10-CM | POA: Insufficient documentation

## 2020-12-28 DIAGNOSIS — F1020 Alcohol dependence, uncomplicated: Secondary | ICD-10-CM | POA: Insufficient documentation

## 2020-12-28 DIAGNOSIS — Z5189 Encounter for other specified aftercare: Secondary | ICD-10-CM | POA: Insufficient documentation

## 2020-12-28 DIAGNOSIS — K5900 Constipation, unspecified: Secondary | ICD-10-CM

## 2020-12-28 DIAGNOSIS — Z5111 Encounter for antineoplastic chemotherapy: Secondary | ICD-10-CM | POA: Insufficient documentation

## 2020-12-28 DIAGNOSIS — R634 Abnormal weight loss: Secondary | ICD-10-CM | POA: Insufficient documentation

## 2020-12-28 DIAGNOSIS — C7951 Secondary malignant neoplasm of bone: Secondary | ICD-10-CM | POA: Insufficient documentation

## 2020-12-28 DIAGNOSIS — Z79899 Other long term (current) drug therapy: Secondary | ICD-10-CM | POA: Insufficient documentation

## 2020-12-28 DIAGNOSIS — R531 Weakness: Secondary | ICD-10-CM | POA: Insufficient documentation

## 2020-12-28 DIAGNOSIS — Z7189 Other specified counseling: Secondary | ICD-10-CM

## 2020-12-28 DIAGNOSIS — C154 Malignant neoplasm of middle third of esophagus: Secondary | ICD-10-CM | POA: Insufficient documentation

## 2020-12-28 DIAGNOSIS — R1310 Dysphagia, unspecified: Secondary | ICD-10-CM | POA: Insufficient documentation

## 2020-12-28 MED ORDER — MIRTAZAPINE 15 MG PO TABS
15.0000 mg | ORAL_TABLET | Freq: Every day | ORAL | 0 refills | Status: DC
  Filled 2020-12-28: qty 30, 30d supply, fill #0

## 2020-12-28 MED ORDER — GABAPENTIN 300 MG PO CAPS
300.0000 mg | ORAL_CAPSULE | Freq: Three times a day (TID) | ORAL | 0 refills | Status: DC
  Filled 2020-12-28 – 2021-02-10 (×2): qty 90, 30d supply, fill #0

## 2020-12-28 NOTE — Patient Instructions (Addendum)
Please follow instructions below as we discussed during your visit: 1.) Miralax every morning mixed with water or juice (for constipation)  2.) MS Contin every 8 hours for pain   3.) Neurontin/Gabepentin 3 times a day for nerve pain  4.) Remeron at bedtime for appetite stimulation. This may also help with sleeping  5.) We have completed DNR form as requested. Please place in a visible location in your home  Please contact our office for any questions/concerns at [phone]   Por favor, siga las instrucciones a continuacin como discutimos durante su visita: 1.) Miralax todas las [employer] con agua o jugo (para el estreimiento)  2.) MS Contin cada 8 horas para el dolor  3.) Neurontin/Gabepentin 3 veces al da para el dolor nervioso  4.) Remeron a la hora de acostarse para estimular el apetito. Esto tambin puede ayudar a dormir.  5.) Hemos completado el formulario DNR segn lo solicitado. Colquelo en un lugar visible de su hogar.  Comunquese con nuestra oficina si tiene alguna pregunta o inquietud al [phone]

## 2020-12-28 NOTE — Addendum Note (Signed)
Addended by: Jimmy Footman on: 12/28/2020 03:15 PM   Modules accepted: Orders

## 2020-12-28 NOTE — Progress Notes (Signed)
  Outpatient Palliative Care  [phone] ________________________________ Nursing Assessment:  Name: Jimmy Hawkins        MRN: [id number]  Date of Service: 12/28/2020 DOB: Jul 02, 1975 Visit Type: New patient-symptom management, goals of care  I asked [name] and his sister if they were aware of what our team provided. He said he didn't know. I told him that we are here to help manage any symptoms that he is having, such as pain, and to make sure his goals are met throughout his treatment.   Support at Visit: Verdis Frederickson, sister and Interpreter   Review of Systems: General: [name] reports fatigue and napping throughout the day. He states that he is not sleeping at night and is up and down throughout the night.   Neuro: Reports numbness in L leg. Reports some blurry vision. Cardiac: None Pulmonary: Reports some SHOB. He stated he his having some right now and also reports having pain with sitting, as he had to stand during the assessment for relief.   GI: [name] reports that he is hardly eating due to no appetite.  He said he is taking Ensure for supplementation.  Reports constipation recently.  GU: None Integumentary: None  Psychological: [name] reports no anxiety or depression.    Medication Changes/Additions: None    Pain Review: Scale of 0-10: 5 Location: Lower back Description: "nerve pain"-reports coming and going Frequency: Constant Relief: pain medication, position changes    Social Review: Living Situation: lives with wife Support: wife, sister   Falls in the last 3 months? None  Assistive Device Use? Walker   Functional Status: [name] reports that his wife has to help him with ADLs such as bathing and dressing at home. Moderate assist.   ACP Form Review: Per Dr. Ernestina Penna note, [name] stated that he did want to be a DNR. I asked him if he still wanted this. He asked me to clarify what DNR means. I explained that, if  his heart were to stop beating, a DNR means that we would not perform chest compressions in an attempt to start his heart again. He confirmed that he did still want this.   Family/Patient Concerns: The family's concerns at this visit were pain management/control, inability to sleep, completing DNR form, and needing a shower chair for assistance. Lexine Baton, NP notified.    Provider notified of assessment and patient/family concerns. Patient instructed to call with any questions or concerns.

## 2020-12-28 NOTE — Progress Notes (Signed)
[place]  [phone] Fax:(336) [phone]   Name: Jimmy Hawkins Date: 12/28/2020 MRN: [id number]  DOB: 1975/09/01  Patient Care Team: [name], MD as PCP - General (Hematology) [name], MD as Consulting Physician (Oncology)    REASON FOR CONSULTATION: Jimmy Hawkins is a 45 y.o. 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  Palliative ask to see for symptom management and goals of care.    SOCIAL HISTORY:     reports that he has never smoked. He has never used smokeless tobacco. He reports current alcohol use of about 30.0 standard drinks per week. He reports that he does not use drugs.  ADVANCE DIRECTIVES:  Patient reports he does not have a documented advanced directive.  MOST form introduced.  Patient expressed wishes to further discuss at his follow-up visit.  He did express wishes for DNR/DNI.  Out a facility form completed and given to patient and his sister.  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    PAST SURGICAL HISTORY:  Past Surgical History:  Procedure Laterality Date   BIOPSY  08/11/2019   Procedure: BIOPSY;  Surgeon: [name], MD;  Location: [facility] ENDOSCOPY;  Service: Endoscopy;;   BIOPSY  11/28/2019   Procedure: BIOPSY;  Surgeon: [name], MD;  Location: [facility] ENDOSCOPY;  Service: Gastroenterology;;   ESOPHAGOGASTRODUODENOSCOPY  08/11/2019   ESOPHAGOGASTRODUODENOSCOPY (EGD) WITH PROPOFOL N/A 08/11/2019   Procedure: ESOPHAGOGASTRODUODENOSCOPY (EGD) WITH PROPOFOL;  Surgeon:  [name], MD;  Location: [facility] ENDOSCOPY;  Service: Endoscopy;  Laterality: N/A;   ESOPHAGOGASTRODUODENOSCOPY (EGD) WITH PROPOFOL N/A 11/28/2019   Procedure: ESOPHAGOGASTRODUODENOSCOPY (EGD) WITH PROPOFOL;  Surgeon: Rush Landmark Telford Nab., MD;  Location: [place];  Service: Gastroenterology;  Laterality: N/A;   EUS N/A 11/28/2019   Procedure: UPPER ENDOSCOPIC ULTRASOUND (EUS) RADIAL;  Surgeon: [name], MD;  Location: [place];  Service: Gastroenterology;  Laterality: N/A;   IR IMAGING GUIDED PORT INSERTION  12/07/2020    HEMATOLOGY/ONCOLOGY HISTORY:  Oncology History Overview Note  Cancer Staging Malignant neoplasm of middle third of esophagus ([place]) Staging form: Esophagus - Squamous Cell Carcinoma, AJCC 8th Edition - Clinical: Stage Unknown (cTX, cN1, cM0) - Signed by [name], MD on 08/18/2019    Malignant neoplasm of middle third of esophagus [facility])   Initial Diagnosis   Malignant neoplasm of middle third of esophagus ([place])   08/11/2019 Imaging   CT CAP W contrast 08/11/19 IMPRESSION: 1. Mid to lower esophageal mass along an 8 cm segment, large endoluminal component, strongly favoring esophageal malignancy. Borderline enlarged AP window lymph node. No findings of distant metastatic disease. 2. Trace bilateral pleural effusions. 3. Diffuse hepatic steatosis. 4. Cholelithiasis. 5. Fatty spermatic cords likely due to small bilateral indirect inguinal hernias.   08/11/2019 Procedure   EGD by Dr Carlean Purl  IMPRESSION - Partially obstructing, likely malignant esophageal tumor was found in the upper third of the esophagus and in the middle third of the esophagus. Biopsied. Very large and long - difficult but able to advance scope by this lesion - await CT for better length estimate - Normal stomach. - Normal examined duodenum.  08/11/2019 Initial Biopsy   FINAL MICROSCOPIC DIAGNOSIS:   A. ESOPHAGUS, UPPER, BIOPSY:  - Squamous cell carcinoma.    08/11/2019  Genetic Testing   PD-L1 Expression 90%   08/18/2019 Cancer Staging   Staging form: Esophagus - Squamous Cell Carcinoma, AJCC 8th Edition - Clinical: Stage Unknown (cTX, cN1, cM0) - Signed by [name], MD on 08/18/2019    08/29/2019 - 10/04/2019 Chemotherapy   Concurrent chemoradiation with weekly CT for 6 weeks starting 08/29/19-10/04/19    08/29/2019 - 10/06/2019 Radiation Therapy   Concurrent chemoradiation with Dr Lisbeth Renshaw starting 08/29/19-10/06/19   11/11/2019 PET scan   IMPRESSION: 1. Again seen is a long segment of FDG avid wall thickening of the distal half of the thoracic esophagus compatible with known esophageal neoplasm. 2. No signs of FDG avid nodal or distant metastatic disease.     11/28/2019 Procedure   EUS by Dr Rush Landmark IMPRESSION EGD Impression: - No gross lesions in esophagus proximally. - Previously noted esophageal cancer is not present. However, in its place and extending distal to this is likely LA Grade D chemotherapy/radiation esophagitis with bleeding. Biopsied after the EUS was complete to evaluate for persistent disease or not. - Z-line regular, 37 cm from the incisors. - 3 cm hiatal hernia. - Erythematous mucosa in the stomach. No other gross lesions in the stomach. Biopsied. - No gross lesions in the duodenal bulb, in the first portion of the duodenum and in the second portion of the duodenum. EUS Impression: - Wall thickening was seen in the thoracic esophagus into the gastroesophageal junction. The thickening appeared to primarily be within the luminal interface/superficial mucosa (Layer 1) and deep mucosa (Layer 2). The previously noted esophageal cancer has been replaced by what appears to be radiation/chemotherapy induced changes. With the limitations of having to use the mini-probe EUS, I could not visualize persistent significant disease other than the entire distal area of the esophagus having likely changes noted as above. - No malignant-appearing  lymph nodes were visualized in the middle paraesophageal mediastinum (level 37M), lower paraesophageal mediastinum (level 8L), diaphragmatic region (level 15) and paracardial region (level 16).      FINAL MICROSCOPIC DIAGNOSIS:   A. STOMACH, BIOPSY:  - Mildly active chronic gastritis.  - Warthin-Starry negative for Helicobacter pylori.  - No intestinal metaplasia, dysplasia or carcinoma.   B. ESOPHAGUS, BIOPSY:  - Inflamed granulation tissue and exudate consistent with ulcer.  - No malignancy identified.    12/20/2019 - 02/2020 Chemotherapy   CAPOX q3weeks with Xeloda 1563m BID 2 weeks on/1 week off starting 12/20/19-02/2020   03/21/2020 [facility]   [facility]Circumferential thickening of the lower third of the esophagus which corresponds with known esophageal malignancy. No evidence of metastatic disease within the chest.  --For findings below the diaphragm, please see concurrent separately reported CT of the abdomen and pelvis.  --Expansile lytic mass with associated soft tissue component centered in the left sacral ala and abutting the left S1 and S2 nerve roots, left internal iliac vasculature, and left pyriformis muscle with no other metastatic disease within the abdomen or pelvis.   --Diffuse thickening of the distal esophagus compatible with known esophageal malignancy.   --Cholelithiasis with nonspecific gallbladder distention. Clinical correlation is recommended. If there is clinical concern for cholecystitis, further evaluation with ultrasound can be obtained.   03/21/2020 Imaging   CT Lumbar Spine  Expansile lytic lesion centered in the left sacral ala with no other evidence of metastatic disease in the lumbar spine.  03/26/2020 - 04/10/2020 Radiation Therapy   Palliative Radiation to Sacrum with Dr Lisbeth Renshaw 03/26/20-04/10/20.    04/11/2020 Progression   PET  IMPRESSION: 1. New hypermetabolic expansile 6.1 cm upper left sacral lytic bone metastasis, centrally  necrotic. 2. New hypermetabolic left level 1b and left level 2 neck lymph nodes, indeterminate for reactive versus metastatic nodes. 3. Persistent hypermetabolism associated with a short segment of mid to lower thoracic esophagus with associated circumferential esophageal wall thickening, similar in metabolism and decreased in wall thickness since 11/11/2019 PET-CT, nonspecific, favor post treatment change, with residual neoplasm not excluded. 4. No additional sites of hypermetabolic metastatic disease. 5.  Chronic findings include: Cholelithiasis.   04/27/2020 -  Chemotherapy   First-line with immunotherapy Keytruda q3weeks starting 04/27/20       06/29/2020 -  Chemotherapy   Weekly carbo AUC 1.5 and Taxol 31m/m2 on day 1, 8 every 21 days added to first-line Keytruda from C4 on 06/29/20.     07/17/2020 PET scan   IMPRESSION: 1. Left sacral metastasis is slightly enlarged and slightly increased in peripheral hypermetabolism. No additional evidence of metastatic disease. 2. Similar mild hypermetabolism associated with the mid esophagus. No residual hypermetabolism within left neck lymph nodes. 3. Small pericardial effusion. 4. Trace bilateral pleural effusions. 5. Cholelithiasis.     09/14/2020 Procedure   Upper Endoscopy  Impression: - Esophagitis. Biopsied. - Esophageal stenosis. Dilated. Biopsied. this looks most like a mid-distal inflammatory stricture (? post XRT +/- reflux, Candida?) scope passed through but styill felt gerip on scope so did not advance deeper into stomach to avoid trauma. - Normal cardia.    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 - NO EVIDENCE OF DYSPLASIA OR MALIGNANCY. - SEE MICROSCOPIC DESCRIPTION. Microscopic Comment While histologic changes of radiation effects are not apparent in the inflamed granulation tissue, the presence of ulceration can be indicative of radiation  effects.   11/08/2020 PET scan   IMPRESSION: 1. Substantial (about 70%) increase in volume of the left sacral metastatic lesion which has central necrosis and peripheral hypermetabolic activity. This lesion now has increased erosion across the SI joint into the left iliac bone and into the left sacrum and sacral spinal canal. 2. Mild increase in activity in the thickened segment of mid to distal esophagus corresponding to the original mass, current maximum SUV 6.2 (formerly 4.6). 3. Stable small posterior pericardial effusion.   11/23/2020 -  Chemotherapy   Patient is on Treatment Plan : LUNG Docetaxel q21d       ALLERGIES:  has No Known Allergies.  MEDICATIONS:  Current Outpatient Medications  Medication Sig Dispense Refill   ciprofloxacin (CIPRO) 500 MG tablet Take 1 tablet by mouth 2 (two) times daily. 10 tablet 0   dexamethasone (DECADRON) 4 MG tablet Take 1 tablet (4 mg total) by mouth 2 (two) times daily. Start the day before Taxotere. Then daily after chemo for 2 days. 15 tablet 1   diphenhydrAMINE-APAP, sleep, (TYLENOL PM EXTRA STRENGTH) 50-1000 MG/30ML LIQD Take 15 mLs by mouth at bedtime as needed (sleep).     famotidine (PEPCID) 20 MG tablet TOME 1 PASTILLA POR VIA ORAL DOS VECES AL DIA 60 tablet 3   Ferrous Sulfate (IRON) 325 (65 Fe) MG TABS Take 1 tablet (325 mg total) by mouth 2 (two) times daily. 60 tablet 3   ferrous sulfate 325 (65 FE) MG tablet TAKE 1 TABLET BY MOUTH 2 TIMES DAILY. 60 tablet 3  gabapentin (NEURONTIN) 300 MG capsule Take 1 capsule (300 mg total) by mouth 3 (three) times daily. 90 capsule 0   HYDROcodone-acetaminophen (HYCET) 7.5-325 mg/15 ml solution Take 10 mLs by mouth every 6 (six) hours as needed for moderate pain. 473 mL 0   mirtazapine (REMERON) 15 MG tablet Take 1 tablet (15 mg total) by mouth at bedtime. 30 tablet 0   morphine (MS CONTIN) 30 MG 12 hr tablet Take 1 tablet by mouth every 8  hours. 90 tablet 0   oxyCODONE (ROXICODONE) 5 MG/5ML  solution Take 5 mLs (5 mg total) by mouth every 6 (six) hours as needed for severe pain. OK to refill on 11/12/2020 473 mL 0   pantoprazole (PROTONIX) 40 MG tablet Take 1 tablet (40 mg total) by mouth 2 (two) times daily before a meal. 60 tablet 3   prochlorperazine (COMPAZINE) 10 MG tablet Take 1 tablet by mouth every 6 hours as needed for nausea or vomiting. 60 tablet 2   sucralfate (CARAFATE) 1 g tablet Take 1 tablet by mouth 4 times daily. (Dissolve in 15 mls of water before taking) 120 tablet 2   No current facility-administered medications for this visit.   Facility-Administered Medications Ordered in Other Visits  Medication Dose Route Frequency Provider Last Rate Last Admin   diphenhydrAMINE (BENADRYL) 50 MG/ML injection            diphenhydrAMINE (BENADRYL) 50 MG/ML injection            famotidine (PEPCID) 20 mg IVPB            famotidine (PEPCID) 20 mg IVPB            palonosetron (ALOXI) 0.25 MG/5ML injection            palonosetron (ALOXI) 0.25 MG/5ML injection             VITAL SIGNS: BP 135/87 (BP Location: Left Arm, Patient Position: Sitting)   Pulse (!) 128 Comment: Nikki NP aware  Temp 99 F (37.2 C) (Oral)   Resp 20   Ht _0  (1.702 m)   Wt 152 lb 14.4 oz (69.4 kg)   SpO2 98%   BMI 23.95 kg/m  Filed Weights   12/28/20 1013  Weight: 152 lb 14.4 oz (69.4 kg)    Estimated body mass index is 23.95 kg/m as calculated from the following:   Height as of this encounter: _1  (1.702 m).   Weight as of this encounter: 152 lb 14.4 oz (69.4 kg).  LABS: CBC:    Component Value Date/Time   WBC 3.9 (L) 12/14/2020 1108   HGB 11.0 (L) 12/14/2020 1108   HGB 12.7 (L) 08/17/2020 1339   HCT 32.6 (L) 12/14/2020 1108   PLT 162 12/14/2020 1108   PLT 144 (L) 08/17/2020 1339   MCV 97.6 12/14/2020 1108   NEUTROABS 2.4 12/14/2020 1108   LYMPHSABS 0.6 (L) 12/14/2020 1108   MONOABS 0.8 12/14/2020 1108   EOSABS 0.1 12/14/2020 1108   BASOSABS 0.0 12/14/2020 1108    Comprehensive Metabolic Panel:    Component Value Date/Time   NA 137 12/14/2020 1108   K 3.6 12/14/2020 1108   CL 102 12/14/2020 1108   CO2 25 12/14/2020 1108   BUN 5 (L) 12/14/2020 1108   CREATININE 0.59 (L) 12/14/2020 1108   CREATININE 0.37 (L) 11/09/2020 1033   GLUCOSE 120 (H) 12/14/2020 1108   CALCIUM 9.2 12/14/2020 1108   AST 21 12/14/2020 1108   AST 32 11/09/2020 1033  ALT 9 12/14/2020 1108   ALT 15 11/09/2020 1033   ALKPHOS 93 12/14/2020 1108   BILITOT 0.6 12/14/2020 1108   BILITOT 0.4 11/09/2020 1033   PROT 7.2 12/14/2020 1108   ALBUMIN 3.3 (L) 12/14/2020 1108    RADIOGRAPHIC STUDIES: IR IMAGING GUIDED PORT INSERTION  Result Date: 12/07/2020 INDICATION: Esophageal malignancy EXAM: IMPLANTED PORT A CATH PLACEMENT WITH ULTRASOUND AND FLUOROSCOPIC GUIDANCE MEDICATIONS: None ANESTHESIA/SEDATION: Moderate (conscious) sedation was employed during this procedure. A total of Versed 4 mg and Fentanyl 100 mcg was administered intravenously. Moderate Sedation Time: 20 minutes. The patient's level of consciousness and vital signs were monitored continuously by radiology nursing throughout the procedure under my direct supervision. FLUOROSCOPY TIME:  Fifty-four seconds (3 mGy) COMPLICATIONS: None immediate. PROCEDURE: The procedure, risks, benefits, and alternatives were explained to the patient. Questions regarding the procedure were encouraged and answered. The patient understands and consents to the procedure. A timeout was performed prior to the initiation of the procedure. Patient positioned supine on the angiography table. Right neck and anterior upper chest prepped and draped in the usual sterile fashion. All elements of maximal sterile barrier were utilized including, cap, mask, sterile gown, sterile gloves, large sterile drape, hand scrubbing and 2% Chlorhexidine for skin cleaning. The right internal jugular vein was evaluated with ultrasound and shown to be patent. A permanent  ultrasound image was obtained and placed in the patient's medical record. Local anesthesia was provided with 1% lidocaine with epinephrine. Using sterile gel and a sterile probe cover, the right internal jugular vein was entered with a 21 ga needle during real time ultrasound guidance. 0.018 inch guidewire placed and 21 ga needle exchanged for transitional dilator set. Utilizing fluoroscopy, 0.035 inch guidewire advanced through the needle without difficulty. Attention then turned to the right anterior upper chest. Following local lidocaine administration, a port pocket was created. The catheter was connected to the port and brought from the pocket to the venotomy site through a subcutaneous tunnel. The catheter was cut to size and inserted through the peel-away sheath. The catheter tip was positioned at the cavoatrial junction using fluoroscopic guidance. The port aspirated and flushed well. The port pocket was closed with deep and superficial absorbable suture. The port pocket incision and venotomy sites were also sealed with Dermabond. IMPRESSION: Successful placement of a right internal jugular approach power injectable Port-A-Cath. The catheter is ready for immediate use. Electronically Signed   By: Miachel Roux M.D.   On: 12/07/2020 12:50    PERFORMANCE STATUS (ECOG) : 2 - Symptomatic, <50% confined to bed  Review of Systems  HENT:  Positive for trouble swallowing.   Gastrointestinal:  Positive for rectal pain.  Musculoskeletal:  Positive for back pain.  Neurological:  Positive for weakness.  Unless otherwise noted, a complete review of systems is negative.  Physical Exam General: NAD Cardiovascular: regular rate and rhythm Pulmonary: clear ant fields Abdomen: soft, nontender, + bowel sounds Extremities: no edema, no joint deformities Neurological: Weakness, ambulatory with a cane, AAOx3 with Spanish interpretation.  IMPRESSION:  This is my initial visit with [name].  He  presents to the clinic today with support of his sister.  Judeth Porch, interpreter also present.  No acute distress noted.  Patient does endorse sacral pain.  He is observed sitting and changing positions frequently as he states pain is more uncomfortable if he is sitting with pressure for a long period of time.  I introduced myself and Hulan Fess in addition to our role as  palliative in collaboration with his oncology team. Concept of Palliative Care was introduced as specialized medical care for people and their families living with serious illness.  It focuses on providing relief from the symptoms and stress of a serious illness.  The goal is to improve quality of life for both the patient and the family. Values and goals of care important to patient and family were attempted to be elicited. Patient and sister verbalized understanding and appreciation.   Rober shares he lives in the home with his wife of more than 20 years and his daughter.  He states he has great support from his family.  We discussed his overall functional state and nutrition at length.  He reports he most recently has been using a walker due to lower extremity weakness and pain.  He is able to perform most ADLs with minimal assistance.  He sleeps on the sofa as this is more comfortable when him lying in his bed.  States his appetite has been minimal however does feel that it is somewhat improving since recent procedure.  He complains of occasional constipation with bowel movements approximately every 2-3 days.  Education provided on constipation in the setting of opioid use and cancer process.  He verbalized understanding.  Encourage patient to begin taking MiraLAX daily to facilitate regular bowel movements however if no improvement he may take twice a day.  He reports having MiraLAX on hand in the home with plans to begin taking tomorrow.  Wells states he has leg pain, back pain, with majority of his pain in his sacral area.  He has  been taking prescribed medications (MS Contin and hydrocodone).  Detailed discussion regarding his medications and how he has been taking them.  Sister reviewed her understanding of how patient has been taking medications.  Unfortunately he has not been taking medications as prescribed which may be contributing to his uncontrolled pain.  I discussed at length each medication, frequency, indication, and how to take.  Patient and his sister verbalized understanding and is aware instructions will be written and given to him to take home for reference.  He reports he has not been taking gabapentin as he ran out of the prescription.  Discussed his poor nutrition.  He does not feel he has lost any additional weight and possibly having some improvement in his ability to swallow since recent esophageal dilation.  We discussed eating 5-6 small frequent meals throughout the day versus 3 large meals, increase in protein with the use of Ensure/protein shakes, and the use of previously prescribed Remeron.  Patient states he does not think he has a prescription for this but recalls taking it previously.  We discussed Her current illness and what it means in the larger context of Her on-going co-morbidities. Natural disease trajectory and expectations were discussed.  Torrin expresses his awareness that his cancer is incurable with poor long-term prognosis.  He is hopeful for some response to current treatment however is also preparing for the worst.  His sister is tearful during conversation.  Emotional support provided.  I empathetically approached discussions regarding goals of care as I know he has been in discussions with Dr. Burr Medico.  MOST form was introduced.  Patient and sister verbalized understanding with wishes to continue discussions around MOST form at follow-up visit.  He does express wishes for DNR/DNI.  He states he is hopeful he is not dying soon however would not wish to be kept alive by heroic measures when  that time  does come.  Out of facility DNR form completed and given to patient.  I discussed the importance of continued conversation with family and their medical providers regarding overall plan of care and treatment options, ensuring decisions are within the context of the patients values and GOCs.  PLAN: Patient given written instructions with each medication, indication, frequency to assist with better home management as he seems to have some confusion about medications. Continue with prescribed oxycodone and MS Contin per Dr. Burr Medico.  Emphasized frequency as patient did not seem to be taking as recommended. MiraLAX daily for constipation Remeron at bedtime for appetite stimulant with additional benefit of assisting with some insomnia. Gabapentin 3 times daily for neuropathic pain Education provided regarding nutrition and the use of Ensure/protein shakes in addition to 5-6 small meals daily versus 3 large meals. I will plan to see patient back in the clinic in 1-2 weeks in collaboration with other oncology appointments.   Patient and his sister expressed understanding and was in agreement with this plan. He also understands that He can call the clinic at any time with any questions, concerns, or complaints.     Time Total: 65 min   Visit consisted of counseling and education dealing with the complex and emotionally intense issues of symptom management and palliative care in the setting of serious and potentially life-threatening illness.Greater than 50%  of this time was spent counseling and coordinating care related to the above assessment and plan.  Signed by: Alda Lea, AGPCNP-BC Palliative Medicine Team

## 2020-12-31 ENCOUNTER — Encounter: Payer: Self-pay | Admitting: Hematology

## 2020-12-31 ENCOUNTER — Encounter: Payer: Self-pay | Admitting: Physician Assistant

## 2020-12-31 ENCOUNTER — Other Ambulatory Visit (HOSPITAL_COMMUNITY): Payer: Self-pay

## 2020-12-31 ENCOUNTER — Other Ambulatory Visit: Payer: Self-pay | Admitting: Hematology

## 2020-12-31 MED ORDER — HYDROCODONE-ACETAMINOPHEN 7.5-325 MG/15ML PO SOLN
10.0000 mL | Freq: Four times a day (QID) | ORAL | 0 refills | Status: DC | PRN
  Filled 2020-12-31: qty 473, 12d supply, fill #0

## 2021-01-03 ENCOUNTER — Other Ambulatory Visit (HOSPITAL_COMMUNITY): Payer: Self-pay

## 2021-01-03 ENCOUNTER — Encounter: Payer: Self-pay | Admitting: Hematology

## 2021-01-03 ENCOUNTER — Encounter: Payer: Self-pay | Admitting: Physician Assistant

## 2021-01-03 MED FILL — Dexamethasone Sodium Phosphate Inj 100 MG/10ML: INTRAMUSCULAR | Qty: 1 | Status: AC

## 2021-01-04 ENCOUNTER — Encounter: Payer: Self-pay | Admitting: Hematology

## 2021-01-04 ENCOUNTER — Other Ambulatory Visit: Payer: Self-pay

## 2021-01-04 ENCOUNTER — Encounter: Payer: Self-pay | Admitting: Physician Assistant

## 2021-01-04 ENCOUNTER — Other Ambulatory Visit (HOSPITAL_COMMUNITY): Payer: Self-pay

## 2021-01-04 ENCOUNTER — Inpatient Hospital Stay (HOSPITAL_BASED_OUTPATIENT_CLINIC_OR_DEPARTMENT_OTHER): Payer: Self-pay | Admitting: Nurse Practitioner

## 2021-01-04 ENCOUNTER — Telehealth: Payer: Self-pay

## 2021-01-04 ENCOUNTER — Inpatient Hospital Stay (HOSPITAL_BASED_OUTPATIENT_CLINIC_OR_DEPARTMENT_OTHER): Payer: Self-pay | Admitting: Hematology

## 2021-01-04 ENCOUNTER — Inpatient Hospital Stay: Payer: Self-pay

## 2021-01-04 VITALS — BP 117/81 | HR 134 | Temp 99.6°F | Resp 18 | Ht 67.0 in | Wt 155.9 lb

## 2021-01-04 VITALS — HR 116

## 2021-01-04 DIAGNOSIS — C154 Malignant neoplasm of middle third of esophagus: Secondary | ICD-10-CM

## 2021-01-04 DIAGNOSIS — R531 Weakness: Secondary | ICD-10-CM

## 2021-01-04 DIAGNOSIS — C7951 Secondary malignant neoplasm of bone: Secondary | ICD-10-CM

## 2021-01-04 DIAGNOSIS — Z95828 Presence of other vascular implants and grafts: Secondary | ICD-10-CM

## 2021-01-04 DIAGNOSIS — Z515 Encounter for palliative care: Secondary | ICD-10-CM

## 2021-01-04 DIAGNOSIS — G893 Neoplasm related pain (acute) (chronic): Secondary | ICD-10-CM

## 2021-01-04 LAB — CBC WITH DIFFERENTIAL/PLATELET
Abs Immature Granulocytes: 0.05 10*3/uL (ref 0.00–0.07)
Basophils Absolute: 0 10*3/uL (ref 0.0–0.1)
Basophils Relative: 1 %
Eosinophils Absolute: 0.1 10*3/uL (ref 0.0–0.5)
Eosinophils Relative: 2 %
HCT: 30 % — ABNORMAL LOW (ref 39.0–52.0)
Hemoglobin: 10 g/dL — ABNORMAL LOW (ref 13.0–17.0)
Immature Granulocytes: 1 %
Lymphocytes Relative: 10 %
Lymphs Abs: 0.5 10*3/uL — ABNORMAL LOW (ref 0.7–4.0)
MCH: 32.7 pg (ref 26.0–34.0)
MCHC: 33.3 g/dL (ref 30.0–36.0)
MCV: 98 fL (ref 80.0–100.0)
Monocytes Absolute: 0.9 10*3/uL (ref 0.1–1.0)
Monocytes Relative: 17 %
Neutro Abs: 3.7 10*3/uL (ref 1.7–7.7)
Neutrophils Relative %: 69 %
Platelets: 264 10*3/uL (ref 150–400)
RBC: 3.06 MIL/uL — ABNORMAL LOW (ref 4.22–5.81)
RDW: 14.5 % (ref 11.5–15.5)
WBC: 5.3 10*3/uL (ref 4.0–10.5)
nRBC: 0 % (ref 0.0–0.2)

## 2021-01-04 LAB — COMPREHENSIVE METABOLIC PANEL
ALT: 8 U/L (ref 0–44)
AST: 20 U/L (ref 15–41)
Albumin: 3.1 g/dL — ABNORMAL LOW (ref 3.5–5.0)
Alkaline Phosphatase: 80 U/L (ref 38–126)
Anion gap: 12 (ref 5–15)
BUN: <4 mg/dL — ABNORMAL LOW (ref 6–20)
CO2: 22 mmol/L (ref 22–32)
Calcium: 8.5 mg/dL — ABNORMAL LOW (ref 8.9–10.3)
Chloride: 105 mmol/L (ref 98–111)
Creatinine, Ser: 0.63 mg/dL (ref 0.61–1.24)
GFR, Estimated: >60 mL/min (ref >60)
Glucose, Bld: 120 mg/dL — ABNORMAL HIGH (ref 70–99)
Potassium: 3.5 mmol/L (ref 3.5–5.1)
Sodium: 139 mmol/L (ref 135–145)
Total Bilirubin: 0.4 mg/dL (ref 0.3–1.2)
Total Protein: 6.7 g/dL (ref 6.5–8.1)

## 2021-01-04 MED ORDER — SODIUM CHLORIDE 0.9% FLUSH
10.0000 mL | INTRAVENOUS | Status: DC | PRN
  Administered 2021-01-04: 10 mL

## 2021-01-04 MED ORDER — SODIUM CHLORIDE 0.9 % IV SOLN: Freq: Once | INTRAVENOUS | Status: AC

## 2021-01-04 MED ORDER — HEPARIN SOD (PORK) LOCK FLUSH 100 UNIT/ML IV SOLN
500.0000 [IU] | Freq: Once | INTRAVENOUS | Status: AC | PRN
  Administered 2021-01-04: 500 [IU]

## 2021-01-04 MED ORDER — MIRTAZAPINE 15 MG PO TABS
15.0000 mg | ORAL_TABLET | Freq: Every day | ORAL | 2 refills | Status: DC
  Filled 2021-01-04: qty 30, 30d supply, fill #0

## 2021-01-04 MED ORDER — SODIUM CHLORIDE 0.9 % IV SOLN
75.0000 mg/m2 | Freq: Once | INTRAVENOUS | Status: AC
  Administered 2021-01-04: 140 mg via INTRAVENOUS
  Filled 2021-01-04: qty 14

## 2021-01-04 MED ORDER — SODIUM CHLORIDE 0.9 % IV SOLN
16.0000 mg | Freq: Once | INTRAVENOUS | Status: AC
  Administered 2021-01-04: 16 mg via INTRAVENOUS
  Filled 2021-01-04: qty 8

## 2021-01-04 MED ORDER — SODIUM CHLORIDE 0.9 % IV SOLN
10.0000 mg | Freq: Once | INTRAVENOUS | Status: AC
  Administered 2021-01-04: 10 mg via INTRAVENOUS
  Filled 2021-01-04: qty 10

## 2021-01-04 MED ORDER — MEGESTROL ACETATE 20 MG PO TABS
20.0000 mg | ORAL_TABLET | Freq: Two times a day (BID) | ORAL | 0 refills | Status: DC
  Filled 2021-01-04: qty 60, 30d supply, fill #0

## 2021-01-04 MED ORDER — SODIUM CHLORIDE 0.9% FLUSH
10.0000 mL | Freq: Once | INTRAVENOUS | Status: AC
  Administered 2021-01-04: 10 mL via INTRAVENOUS

## 2021-01-04 MED ORDER — MORPHINE SULFATE ER 30 MG PO TBCR
30.0000 mg | EXTENDED_RELEASE_TABLET | Freq: Three times a day (TID) | ORAL | 0 refills | Status: DC
  Filled 2021-01-04 – 2021-01-14 (×4): qty 90, 30d supply, fill #0

## 2021-01-04 NOTE — Progress Notes (Signed)
Jimmy Hawkins  [phone]) [phone] Fax:(336) [phone]   Name: Jimmy Hawkins Date: 01/04/2021 MRN: [id number]  DOB: October 02, 1975  Patient Care Team: [name], MD as PCP - General (Hematology) [name], MD as Consulting Physician (Oncology)   Jimmy Hawkins is a 45 y.o. 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  Palliative ask to see for symptom management and goals of care.      SOCIAL HISTORY:     reports that he has never smoked. He has never used smokeless tobacco. He reports current alcohol use of about 30.0 standard drinks per week. He reports that he does not use drugs.  ADVANCE DIRECTIVES:  Patient reports he does not have a documented advanced directive.  MOST form introduced.  Patient expressed wishes to further discuss at his follow-up visit.  He did express wishes for DNR/DNI.  Out a facility form completed and given to patient and his sister.    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 Mid to lower esophageal mass along an 8 cm  segment, large endoluminal component, strongly favoring esophageal malignancy. Borderline enlarged AP window lymph node. No findings of distant metastatic disease. 2. Trace bilateral pleural effusions. 3. Diffuse hepatic steatosis. 4. Cholelithiasis. 5. Fatty spermatic cords likely due to small bilateral indirect inguinal hernias.   08/11/2019 Procedure   EGD by Dr Carlean Purl  IMPRESSION - Partially obstructing, likely malignant esophageal tumor was found in the upper third of the esophagus and in the middle third of the esophagus. Biopsied. Very large and long - difficult but able to advance scope by this lesion - await CT for better length estimate - Normal stomach. - Normal examined duodenum.   08/11/2019 Initial Biopsy   FINAL MICROSCOPIC DIAGNOSIS:   A. ESOPHAGUS, UPPER, BIOPSY:  - Squamous cell carcinoma.    08/11/2019 Genetic Testing   PD-L1 Expression 90%   08/18/2019 Cancer Staging   Staging form: Esophagus - Squamous Cell Carcinoma, AJCC 8th Edition - Clinical: Stage Unknown (cTX, cN1, cM0) - Signed by [name], MD on 08/18/2019    08/29/2019 - 10/04/2019 Chemotherapy   Concurrent chemoradiation with weekly CT for 6 weeks starting 08/29/19-10/04/19    08/29/2019 - 10/06/2019 Radiation Therapy   Concurrent chemoradiation with Dr Lisbeth Renshaw starting 08/29/19-10/06/19   11/11/2019 PET scan   IMPRESSION: 1. Again seen is a long segment of FDG avid wall thickening of the distal half of the thoracic esophagus compatible with known esophageal neoplasm. 2. No signs of FDG avid nodal or distant metastatic disease.     11/28/2019 Procedure   EUS by Dr Rush Landmark IMPRESSION EGD Impression: - No gross lesions  in esophagus proximally. - Previously noted esophageal cancer is not present. However, in its place and extending distal to this is likely LA Grade D chemotherapy/radiation esophagitis with bleeding. Biopsied after the EUS was complete to evaluate for persistent disease or not. - Z-line  regular, 37 cm from the incisors. - 3 cm hiatal hernia. - Erythematous mucosa in the stomach. No other gross lesions in the stomach. Biopsied. - No gross lesions in the duodenal bulb, in the first portion of the duodenum and in the second portion of the duodenum. EUS Impression: - Wall thickening was seen in the thoracic esophagus into the gastroesophageal junction. The thickening appeared to primarily be within the luminal interface/superficial mucosa (Layer 1) and deep mucosa (Layer 2). The previously noted esophageal cancer has been replaced by what appears to be radiation/chemotherapy induced changes. With the limitations of having to use the mini-probe EUS, I could not visualize persistent significant disease other than the entire distal area of the esophagus having likely changes noted as above. - No malignant-appearing lymph nodes were visualized in the middle paraesophageal mediastinum (level 45M), lower paraesophageal mediastinum (level 8L), diaphragmatic region (level 15) and paracardial region (level 16).      FINAL MICROSCOPIC DIAGNOSIS:   A. STOMACH, BIOPSY:  - Mildly active chronic gastritis.  - Warthin-Starry negative for Helicobacter pylori.  - No intestinal metaplasia, dysplasia or carcinoma.   B. ESOPHAGUS, BIOPSY:  - Inflamed granulation tissue and exudate consistent with ulcer.  - No malignancy identified.    12/20/2019 - 02/2020 Chemotherapy   CAPOX q3weeks with Xeloda 1529m BID 2 weeks on/1 week off starting 12/20/19-02/2020   03/21/2020 [facility]   [facility]Circumferential thickening of the lower third of the esophagus which corresponds with known esophageal malignancy. No evidence of metastatic disease within the chest.  --For findings below the diaphragm, please see concurrent separately reported CT of the abdomen and pelvis.  --Expansile lytic mass with associated soft tissue component centered in the left sacral ala and abutting the left S1 and S2  nerve roots, left internal iliac vasculature, and left pyriformis muscle with no other metastatic disease within the abdomen or pelvis.   --Diffuse thickening of the distal esophagus compatible with known esophageal malignancy.   --Cholelithiasis with nonspecific gallbladder distention. Clinical correlation is recommended. If there is clinical concern for cholecystitis, further evaluation with ultrasound can be obtained.   03/21/2020 Imaging   CT Lumbar Spine  Expansile lytic lesion centered in the left sacral ala with no other evidence of metastatic disease in the lumbar spine.   03/26/2020 - 04/10/2020 Radiation Therapy   Palliative Radiation to Sacrum with Dr MLisbeth Renshaw2/28/22-3/15/22.    04/11/2020 Progression   PET  IMPRESSION: 1. New hypermetabolic expansile 6.1 cm upper left sacral lytic bone metastasis, centrally necrotic. 2. New hypermetabolic left level 1b and left level 2 neck lymph nodes, indeterminate for reactive versus metastatic nodes. 3. Persistent hypermetabolism associated with a short segment of mid to lower thoracic esophagus with associated circumferential esophageal wall thickening, similar in metabolism and decreased in wall thickness since 11/11/2019 PET-CT, nonspecific, favor post treatment change, with residual neoplasm not excluded. 4. No additional sites of hypermetabolic metastatic disease. 5.  Chronic findings include: Cholelithiasis.   04/27/2020 -  Chemotherapy   First-line with immunotherapy Keytruda q3weeks starting 04/27/20       06/29/2020 -  Chemotherapy   Weekly carbo AUC 1.5 and Taxol 611mm2 on day 1, 8 every 21 days added to first-line Keytruda from  C4 on 06/29/20.     07/17/2020 PET scan   IMPRESSION: 1. Left sacral metastasis is slightly enlarged and slightly increased in peripheral hypermetabolism. No additional evidence of metastatic disease. 2. Similar mild hypermetabolism associated with the mid esophagus. No residual hypermetabolism within  left neck lymph nodes. 3. Small pericardial effusion. 4. Trace bilateral pleural effusions. 5. Cholelithiasis.     09/14/2020 Procedure   Upper Endoscopy  Impression: - Esophagitis. Biopsied. - Esophageal stenosis. Dilated. Biopsied. this looks most like a mid-distal inflammatory stricture (? post XRT +/- reflux, Candida?) scope passed through but styill felt gerip on scope so did not advance deeper into stomach to avoid trauma. - Normal cardia.    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 - NO EVIDENCE OF DYSPLASIA OR MALIGNANCY. - SEE MICROSCOPIC DESCRIPTION. Microscopic Comment While histologic changes of radiation effects are not apparent in the inflamed granulation tissue, the presence of ulceration can be indicative of radiation effects.   11/08/2020 PET scan   IMPRESSION: 1. Substantial (about 70%) increase in volume of the left sacral metastatic lesion which has central necrosis and peripheral hypermetabolic activity. This lesion now has increased erosion across the SI joint into the left iliac bone and into the left sacrum and sacral spinal canal. 2. Mild increase in activity in the thickened segment of mid to distal esophagus corresponding to the original mass, current maximum SUV 6.2 (formerly 4.6). 3. Stable small posterior pericardial effusion.   11/23/2020 -  Chemotherapy   Patient is on Treatment Plan : LUNG Docetaxel q21d       ALLERGIES:  has No Known Allergies.  MEDICATIONS:  Current Outpatient Medications  Medication Sig Dispense Refill   megestrol (MEGACE) 20 MG tablet Take 1 tablet by mouth 2 times daily. 60 tablet 0   ciprofloxacin (CIPRO) 500 MG tablet Take 1 tablet by mouth 2 (two) times daily. 10 tablet 0   dexamethasone (DECADRON) 4 MG tablet Take 1 tablet (4 mg total) by mouth 2 (two) times daily. Start the day before Taxotere. Then daily after chemo for 2 days. 15  tablet 1   diphenhydrAMINE-APAP, sleep, (TYLENOL PM EXTRA STRENGTH) 50-1000 MG/30ML LIQD Take 15 mLs by mouth at bedtime as needed (sleep).     famotidine (PEPCID) 20 MG tablet TOME 1 PASTILLA POR VIA ORAL DOS VECES AL DIA 60 tablet 3   Ferrous Sulfate (IRON) 325 (65 Fe) MG TABS Take 1 tablet (325 mg total) by mouth 2 (two) times daily. 60 tablet 3   ferrous sulfate 325 (65 FE) MG tablet TAKE 1 TABLET BY MOUTH 2 TIMES DAILY. 60 tablet 3   gabapentin (NEURONTIN) 300 MG capsule Take 1 capsule (300 mg total) by mouth 3 (three) times daily. 90 capsule 0   HYDROcodone-acetaminophen (HYCET) 7.5-325 mg/15 ml solution Take 10 mLs by mouth every 6 (six) hours as needed for moderate pain. 473 mL 0   morphine (MS CONTIN) 30 MG 12 hr tablet Take 1 tablet by mouth every 8  hours. 90 tablet 0   pantoprazole (PROTONIX) 40 MG tablet Take 1 tablet (40 mg total) by mouth 2 (two) times daily before a meal. 60 tablet 3   prochlorperazine (COMPAZINE) 10 MG tablet Take 1 tablet by mouth every 6 hours as needed for nausea or vomiting. 60 tablet 2   sucralfate (CARAFATE) 1 g tablet Take 1 tablet by mouth 4 times daily. (Dissolve in 15 mls of water before taking) 120 tablet  2   No current facility-administered medications for this visit.   Facility-Administered Medications Ordered in Other Visits  Medication Dose Route Frequency Provider Last Rate Last Admin   diphenhydrAMINE (BENADRYL) 50 MG/ML injection            diphenhydrAMINE (BENADRYL) 50 MG/ML injection            famotidine (PEPCID) 20 mg IVPB            famotidine (PEPCID) 20 mg IVPB            palonosetron (ALOXI) 0.25 MG/5ML injection            palonosetron (ALOXI) 0.25 MG/5ML injection             VITAL SIGNS: There were no vitals taken for this visit. There were no vitals filed for this visit.  Estimated body mass index is 24.42 kg/m as calculated from the following:   Height as of an earlier encounter on 01/04/21: 5' 7" (1.702 m).   Weight as of  an earlier encounter on 01/04/21: 155 lb 14.4 oz (70.7 kg).  LABS: CBC:    Component Value Date/Time   WBC 5.3 01/04/2021 0954   HGB 10.0 (L) 01/04/2021 0954   HGB 12.7 (L) 08/17/2020 1339   HCT 30.0 (L) 01/04/2021 0954   PLT 264 01/04/2021 0954   PLT 144 (L) 08/17/2020 1339   MCV 98.0 01/04/2021 0954   NEUTROABS 3.7 01/04/2021 0954   LYMPHSABS 0.5 (L) 01/04/2021 0954   MONOABS 0.9 01/04/2021 0954   EOSABS 0.1 01/04/2021 0954   BASOSABS 0.0 01/04/2021 0954   Comprehensive Metabolic Panel:    Component Value Date/Time   NA 139 01/04/2021 0954   K 3.5 01/04/2021 0954   CL 105 01/04/2021 0954   CO2 22 01/04/2021 0954   BUN <4 (L) 01/04/2021 0954   CREATININE 0.63 01/04/2021 0954   CREATININE 0.37 (L) 11/09/2020 1033   GLUCOSE 120 (H) 01/04/2021 0954   CALCIUM 8.5 (L) 01/04/2021 0954   AST 20 01/04/2021 0954   AST 32 11/09/2020 1033   ALT 8 01/04/2021 0954   ALT 15 11/09/2020 1033   ALKPHOS 80 01/04/2021 0954   BILITOT 0.4 01/04/2021 0954   BILITOT 0.4 11/09/2020 1033   PROT 6.7 01/04/2021 0954   ALBUMIN 3.1 (L) 01/04/2021 0954     PERFORMANCE STATUS (ECOG) : 2 - Symptomatic, <50% confined to bed   Physical Exam General: NAD Cardiovascular: regular rate and rhythm Pulmonary: clear ant fields Abdomen: soft, nontender, + bowel sounds Extremities: no edema, no joint deformities Skin: no rashes Neurological: Weakness, AAO x3 with Spanish Interpretor  IMPRESSION:  I saw Rajat during his infusion today with support of Jarrett Soho, Therapist, sports and [place], Veterinary surgeon. No acute distress noted.   Cancer related pain, Sacral, back, bilateral lower extremity weakness, bone mets: States his pain is controlled majority of the time which he is appreciative of. Confirms he is taking hydrocodone and MS Contin as prescribed. Dr. Burr Medico provided refills today during their visit. He would like to continue on current regimen and will let us know if he feels adjustments need to be made. He is taking  gabapentin 346m TID.  Weight loss/decreased appetite/dysphagia: he is currently taking mirtazapine. This was initially working however does not feel it is as effective now. We discussed the use of megace and discontinuing mirtazapine. He would like to try this with hopes of some benefit. He is aware this will be sent in to his pharmacy.  Constipation: reports this is improved. He is taking Miralax as previously recommended. States he is only having to take it once day with good results.   I discussed the importance of continued conversation with family and their medical providers regarding overall plan of care and treatment options, ensuring decisions are within the context of the patients values and GOCs.  PLAN: Continue hydrocodone and MS Contin Miralax daily for constipation Discontinue mirtazapine Start megace twice daily I will plan to see him back in 2-3 weeks in collaboration with his other oncology appointments.    Patient expressed understanding and was in agreement with this plan. He also understands that He can call the clinic at any time with any questions, concerns, or complaints.     Time Total: 25 min  Visit consisted of counseling and education dealing with the complex and emotionally intense issues of symptom management and palliative care in the setting of serious and potentially life-threatening illness.Greater than 50%  of this time was spent counseling and coordinating care related to the above assessment and plan.  Signed by: Alda Lea, AGPCNP-BC Palliative Medicine Team

## 2021-01-04 NOTE — Patient Instructions (Signed)
[facility] MEDICAL ONCOLOGY   ?Discharge Instructions: ?Thank you for choosing [facility] to provide your oncology and hematology care.  ? ?If you have a lab appointment with the Cancer Center, please go directly to the Cancer Center and check in at the registration area. ?  ?Wear comfortable clothing and clothing appropriate for easy access to any Portacath or PICC line.  ? ?We strive to give you quality time with your provider. You may need to reschedule your appointment if you arrive late (15 or more minutes).  Arriving late affects you and other patients whose appointments are after yours.  Also, if you miss three or more appointments without notifying the office, you may be dismissed from the clinic at the provider?s discretion.    ?  ?For prescription refill requests, have your pharmacy contact our office and allow 72 hours for refills to be completed.   ? ?Today you received the following chemotherapy and/or immunotherapy agents: docetaxel    ?  ?To help prevent nausea and vomiting after your treatment, we encourage you to take your nausea medication as directed. ? ?BELOW ARE SYMPTOMS THAT SHOULD BE REPORTED IMMEDIATELY: ?*FEVER GREATER THAN 100.4 F (38 ?C) OR HIGHER ?*CHILLS OR SWEATING ?*NAUSEA AND VOMITING THAT IS NOT CONTROLLED WITH YOUR NAUSEA MEDICATION ?*UNUSUAL SHORTNESS OF BREATH ?*UNUSUAL BRUISING OR BLEEDING ?*URINARY PROBLEMS (pain or burning when urinating, or frequent urination) ?*BOWEL PROBLEMS (unusual diarrhea, constipation, pain near the anus) ?TENDERNESS IN MOUTH AND THROAT WITH OR WITHOUT PRESENCE OF ULCERS (sore throat, sores in mouth, or a toothache) ?UNUSUAL RASH, SWELLING OR PAIN  ?UNUSUAL VAGINAL DISCHARGE OR ITCHING  ? ?Items with * indicate a potential emergency and should be followed up as soon as possible or go to the Emergency Department if any problems should occur. ? ?Please show the CHEMOTHERAPY ALERT CARD or IMMUNOTHERAPY ALERT CARD at check-in  to the Emergency Department and triage nurse. ? ?Should you have questions after your visit or need to cancel or reschedule your appointment, please contact [facility] MEDICAL ONCOL[facility]  Dept: [phone]  and follow the prompts.  Office hours are 8:00 a.m. to 4:30 p.m. Monday - Friday. Please note that voicemails left after 4:00 p.m. may not be returned until the following business day.  We are closed weekends and major holidays. You have access to a nurse at all times for urgent questions. Please call the main number to the clinic Dept: [phone] and follow the prompts. ? ? ?For any non-urgent questions, you may also contact your provider using MyChart. We now offer e-Visits for anyone 18 and older to request care online for non-urgent symptoms. For details visit [email or web address]. ?  ?Also download the MyChart app! Go to the app store, search "MyChart", open the app, select [facility], and log in with your MyChart username and password. ? ?Due to Covid, a mask is required upon entering the hospital/clinic. If you do not have a mask, one will be given to you upon arrival. For doctor visits, patients may have 1 support person aged 18 or older with them. For treatment visits, patients cannot have anyone with them due to current Covid guidelines and our immunocompromised population.  ? ?

## 2021-01-04 NOTE — Progress Notes (Signed)
[place]   Telephone:(336) [phone] Fax:(336) [phone]   Clinic Follow up Note   Patient Care Team: [name], MD as PCP - General (Hematology) [name], MD as Consulting Physician (Oncology)  Date of Service:  01/04/2021  CHIEF COMPLAINT: f/u of metastatic esophageal cancer  CURRENT THERAPY:  Docetaxel, starting 11/23/20  ASSESSMENT & PLAN:  Jimmy Hawkins is a 45 y.o. male with   1. 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 08/11/19 CT CAP found him to have mid esophageal mass and enlarged AP window node, no distant metastasis. -He completed 6 weeks of concurrent chemoRT with weekly Carboplatin and Taxol 08/29/19-10/06/19 -Repeat PET scan 10/2019 showed hypermetabolic wall thickening of the distal half of the thoracic esophageus, no notable distant metastasis.  -He received CAPOX q3weeks with Xeloda 1568m BID 2 weeks on/1 week off 12/20/19-02/2020. -He completed palliative Radiation to sacrum with Dr MLisbeth Renshaw2/28/22-3/15/22.  -He started First-line single agent Keytruda 04/27/20. Due to his slow improvement and squamous cell histology, we added chemo with carboplatin and Taxol 2 weeks on/1 week off from C4 on 06/29/20.  -restaging PET 11/08/20 showed worsening diease to the left sacral lesion and mild increase in activity to esophageal mass. No new metastasis. -he had port placed on 12/07/20 -he switched to docetaxel on 11/23/20. He has been tolerating moderately well overall, his pain is stable on his current regimen. We reviewed his medications again today. -lab reviewed, adequate for treatment, will proceed to cycle 3 docetaxel today -repeat scan after cycle 4    2. Symptom Management: Dysphagia and weight loss -he is undergoing esophageal dilation under Dr. GCarlean Purl most recently 12/24/20. He reports improved  swallowing with no further issues. -he previously noted improvement on mirtazapine, but now they report he doesn't feel like eating a lot of the time and eats only 1 meal a day. I discussed possibly switching to megace to see if that helps. He will see NP NLexine Batonto discuss further.   3. Sacral/back pain, LE weakness, secondary to bone metastasis -He was seen to have bone mets on 04/11/20 PET scan. -He completed palliative Radiation to sacrum with Dr MLisbeth Renshaw2/28/22-3/15/22. He was given short course dexa 464m  -restaging PET 11/08/20 showed continued worsening to left sacral metastatic lesion with erosion across SI joint into left iliac bone, left sacrum, and sacral spinal canal. -He is currently taking hydrocodone 10 cc and MS Contin 30 mg q8hr. His wife notes sometimes oxycodone help but not always. I called in MS contin refill for them to pick up next week. -our palliative care nurse Nikki increased his gabapentin to 300 mg TID. I advised he can increase to 600 mg at night.   4. Goal of care discussion, DNR  -We previously discussed the incurable nature of his cancer, and the overall poor prognosis, especially if he does not have good response to chemotherapy or progress on chemo -The patient understands the goal of care is palliative. -I recommend DNR/DNI, he agreed 11/09/20   5. H/o Heavy alcohol user -He quit drinking since cancer diagnosis, but started again. Due to weakness, he is not able to drink as much since 04/13/20 and stopped overall 06/29/20 -on 10/12/20, he reported he is back to drinking 5 beers a day. I previously advised him to abstain for the day of and a few following chemo.   6. Social and  Financial Support  -Patient does not have insurance and is not currently working. He has already applied for Medicaid. -He has met with financial advocate Shauna and SW and he has grant assistance with medications.       PLAN: -proceed with C3 docetaxel today             -udenyca on day 3  (Monday) -I will see if our palliative care nurse can see him today to discuss anorexia management  -I will refill his mirtazapine and MS Contin -lab, flush, f/u, and C3 in 3 weeks, will order restaging scan on next visit    No problem-specific Assessment & Plan notes found for this encounter.   SUMMARY OF ONCOLOGIC HISTORY: Oncology History Overview Note  Cancer Staging Malignant neoplasm of middle third of esophagus ([place]) Staging form: Esophagus - Squamous Cell Carcinoma, AJCC 8th Edition - Clinical: Stage Unknown (cTX, cN1, cM0) - Signed by [name], MD on 08/18/2019    Malignant neoplasm of middle third of esophagus [facility])   Initial Diagnosis   Malignant neoplasm of middle third of esophagus ([place])   08/11/2019 Imaging   CT CAP W contrast 08/11/19 IMPRESSION: 1. Mid to lower esophageal mass along an 8 cm segment, large endoluminal component, strongly favoring esophageal malignancy. Borderline enlarged AP window lymph node. No findings of distant metastatic disease. 2. Trace bilateral pleural effusions. 3. Diffuse hepatic steatosis. 4. Cholelithiasis. 5. Fatty spermatic cords likely due to small bilateral indirect inguinal hernias.   08/11/2019 Procedure   EGD by Dr Carlean Purl  IMPRESSION - Partially obstructing, likely malignant esophageal tumor was found in the upper third of the esophagus and in the middle third of the esophagus. Biopsied. Very large and long - difficult but able to advance scope by this lesion - await CT for better length estimate - Normal stomach. - Normal examined duodenum.   08/11/2019 Initial Biopsy   FINAL MICROSCOPIC DIAGNOSIS:   A. ESOPHAGUS, UPPER, BIOPSY:  - Squamous cell carcinoma.    08/11/2019 Genetic Testing   PD-L1 Expression 90%   08/18/2019 Cancer Staging   Staging form: Esophagus - Squamous Cell Carcinoma, AJCC 8th Edition - Clinical: Stage Unknown (cTX, cN1, cM0) - Signed by [name], MD on 08/18/2019    08/29/2019 - 10/04/2019  Chemotherapy   Concurrent chemoradiation with weekly CT for 6 weeks starting 08/29/19-10/04/19    08/29/2019 - 10/06/2019 Radiation Therapy   Concurrent chemoradiation with Dr Lisbeth Renshaw starting 08/29/19-10/06/19   11/11/2019 PET scan   IMPRESSION: 1. Again seen is a long segment of FDG avid wall thickening of the distal half of the thoracic esophagus compatible with known esophageal neoplasm. 2. No signs of FDG avid nodal or distant metastatic disease.     11/28/2019 Procedure   EUS by Dr Rush Landmark IMPRESSION EGD Impression: - No gross lesions in esophagus proximally. - Previously noted esophageal cancer is not present. However, in its place and extending distal to this is likely LA Grade D chemotherapy/radiation esophagitis with bleeding. Biopsied after the EUS was complete to evaluate for persistent disease or not. - Z-line regular, 37 cm from the incisors. - 3 cm hiatal hernia. - Erythematous mucosa in the stomach. No other gross lesions in the stomach. Biopsied. - No gross lesions in the duodenal bulb, in the first portion of the duodenum and in the second portion of the duodenum. EUS Impression: - Wall thickening was seen in the thoracic esophagus into the gastroesophageal junction. The thickening appeared to primarily be within the luminal interface/superficial  mucosa (Layer 1) and deep mucosa (Layer 2). The previously noted esophageal cancer has been replaced by what appears to be radiation/chemotherapy induced changes. With the limitations of having to use the mini-probe EUS, I could not visualize persistent significant disease other than the entire distal area of the esophagus having likely changes noted as above. - No malignant-appearing lymph nodes were visualized in the middle paraesophageal mediastinum (level 57M), lower paraesophageal mediastinum (level 8L), diaphragmatic region (level 15) and paracardial region (level 16).      FINAL MICROSCOPIC DIAGNOSIS:   A. STOMACH,  BIOPSY:  - Mildly active chronic gastritis.  - Warthin-Starry negative for Helicobacter pylori.  - No intestinal metaplasia, dysplasia or carcinoma.   B. ESOPHAGUS, BIOPSY:  - Inflamed granulation tissue and exudate consistent with ulcer.  - No malignancy identified.    12/20/2019 - 02/2020 Chemotherapy   CAPOX q3weeks with Xeloda 1531m BID 2 weeks on/1 week off starting 12/20/19-02/2020   03/21/2020 Imaging   [facility]--Circumferential thickening of the lower third of the esophagus which corresponds with known esophageal malignancy. No evidence of metastatic disease within the chest.  --For findings below the diaphragm, please see concurrent separately reported CT of the abdomen and pelvis.  --Expansile lytic mass with associated soft tissue component centered in the left sacral ala and abutting the left S1 and S2 nerve roots, left internal iliac vasculature, and left pyriformis muscle with no other metastatic disease within the abdomen or pelvis.   --Diffuse thickening of the distal esophagus compatible with known esophageal malignancy.   --Cholelithiasis with nonspecific gallbladder distention. Clinical correlation is recommended. If there is clinical concern for cholecystitis, further evaluation with ultrasound can be obtained.   03/21/2020 Imaging   CT Lumbar Spine  Expansile lytic lesion centered in the left sacral ala with no other evidence of metastatic disease in the lumbar spine.   03/26/2020 - 04/10/2020 Radiation Therapy   Palliative Radiation to Sacrum with Dr MLisbeth Renshaw2/28/22-3/15/22.    04/11/2020 Progression   PET  IMPRESSION: 1. New hypermetabolic expansile 6.1 cm upper left sacral lytic bone metastasis, centrally necrotic. 2. New hypermetabolic left level 1b and left level 2 neck lymph nodes, indeterminate for reactive versus metastatic nodes. 3. Persistent hypermetabolism associated with a short segment of mid to lower thoracic esophagus with associated  circumferential esophageal wall thickening, similar in metabolism and decreased in wall thickness since 11/11/2019 PET-CT, nonspecific, favor post treatment change, with residual neoplasm not excluded. 4. No additional sites of hypermetabolic metastatic disease. 5.  Chronic findings include: Cholelithiasis.   04/27/2020 -  Chemotherapy   First-line with immunotherapy Keytruda q3weeks starting 04/27/20       06/29/2020 -  Chemotherapy   Weekly carbo AUC 1.5 and Taxol 655mm2 on day 1, 8 every 21 days added to first-line Keytruda from C4 on 06/29/20.     07/17/2020 PET scan   IMPRESSION: 1. Left sacral metastasis is slightly enlarged and slightly increased in peripheral hypermetabolism. No additional evidence of metastatic disease. 2. Similar mild hypermetabolism associated with the mid esophagus. No residual hypermetabolism within left neck lymph nodes. 3. Small pericardial effusion. 4. Trace bilateral pleural effusions. 5. Cholelithiasis.     09/14/2020 Procedure   Upper Endoscopy  Impression: - Esophagitis. Biopsied. - Esophageal stenosis. Dilated. Biopsied. this looks most like a mid-distal inflammatory stricture (? post XRT +/- reflux, Candida?) scope passed through but styill felt gerip on scope so did not advance deeper into stomach to avoid trauma. - Normal cardia.  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 - NO EVIDENCE OF DYSPLASIA OR MALIGNANCY. - SEE MICROSCOPIC DESCRIPTION. Microscopic Comment While histologic changes of radiation effects are not apparent in the inflamed granulation tissue, the presence of ulceration can be indicative of radiation effects.   11/08/2020 PET scan   IMPRESSION: 1. Substantial (about 70%) increase in volume of the left sacral metastatic lesion which has central necrosis and peripheral hypermetabolic activity. This lesion now has increased erosion across the  SI joint into the left iliac bone and into the left sacrum and sacral spinal canal. 2. Mild increase in activity in the thickened segment of mid to distal esophagus corresponding to the original mass, current maximum SUV 6.2 (formerly 4.6). 3. Stable small posterior pericardial effusion.   11/23/2020 -  Chemotherapy   Patient is on Treatment Plan : LUNG Docetaxel q21d        INTERVAL HISTORY:  Jimmy Hawkins is here for a follow up of metastatic esophageal cancer. He was last seen by me on 12/14/20, and by palliative care in the interim. He presents to the clinic accompanied by his wife and our interpreter Almyra Free. He reports things are the same. He again had to stand during our visit due to pain. His gabapentin was increased by NP [facility]. He continues on hydrocodone and MS contin. His wife notes sometimes the oxycodone helps but other times not.   All other systems were reviewed with the patient and are negative.  MEDICAL HISTORY:  Past Medical History:  Diagnosis Date   Blood transfusion without reported diagnosis    Cramps of left lower extremity 2021   Hematemesis with nausea 08/10/2019   Squamous cell carcinoma, esophagus ([place]) 04/2019   metastatic    SURGICAL HISTORY: Past Surgical History:  Procedure Laterality Date   BIOPSY  08/11/2019   Procedure: BIOPSY;  Surgeon: [name], MD;  Location: [facility] ENDOSCOPY;  Service: Endoscopy;;   BIOPSY  11/28/2019   Procedure: BIOPSY;  Surgeon: [name], MD;  Location: [facility] ENDOSCOPY;  Service: Gastroenterology;;   ESOPHAGOGASTRODUODENOSCOPY  08/11/2019   ESOPHAGOGASTRODUODENOSCOPY (EGD) WITH PROPOFOL N/A 08/11/2019   Procedure: ESOPHAGOGASTRODUODENOSCOPY (EGD) WITH PROPOFOL;  Surgeon: [name], MD;  Location: [place];  Service: Endoscopy;  Laterality: N/A;   ESOPHAGOGASTRODUODENOSCOPY (EGD) WITH PROPOFOL N/A 11/28/2019   Procedure: ESOPHAGOGASTRODUODENOSCOPY (EGD) WITH PROPOFOL;  Surgeon: Rush Landmark  Telford Nab., MD;  Location: [place];  Service: Gastroenterology;  Laterality: N/A;   EUS N/A 11/28/2019   Procedure: UPPER ENDOSCOPIC ULTRASOUND (EUS) RADIAL;  Surgeon: [name], MD;  Location: [place];  Service: Gastroenterology;  Laterality: N/A;   IR IMAGING GUIDED PORT INSERTION  12/07/2020    I have reviewed the social history and family history with the patient and they are unchanged from previous note.  ALLERGIES:  has No Known Allergies.  MEDICATIONS:  Current Outpatient Medications  Medication Sig Dispense Refill   ciprofloxacin (CIPRO) 500 MG tablet Take 1 tablet by mouth 2 (two) times daily. 10 tablet 0   dexamethasone (DECADRON) 4 MG tablet Take 1 tablet (4 mg total) by mouth 2 (two) times daily. Start the day before Taxotere. Then daily after chemo for 2 days. 15 tablet 1   diphenhydrAMINE-APAP, sleep, (TYLENOL PM EXTRA STRENGTH) 50-1000 MG/30ML LIQD Take 15 mLs by mouth at bedtime as needed (sleep).     famotidine (PEPCID) 20 MG tablet TOME 1 PASTILLA POR VIA ORAL DOS VECES AL DIA 60  tablet 3   Ferrous Sulfate (IRON) 325 (65 Fe) MG TABS Take 1 tablet (325 mg total) by mouth 2 (two) times daily. 60 tablet 3   ferrous sulfate 325 (65 FE) MG tablet TAKE 1 TABLET BY MOUTH 2 TIMES DAILY. 60 tablet 3   gabapentin (NEURONTIN) 300 MG capsule Take 1 capsule (300 mg total) by mouth 3 (three) times daily. 90 capsule 0   HYDROcodone-acetaminophen (HYCET) 7.5-325 mg/15 ml solution Take 10 mLs by mouth every 6 (six) hours as needed for moderate pain. 473 mL 0   megestrol (MEGACE) 20 MG tablet Take 1 tablet by mouth 2 times daily. 60 tablet 0   morphine (MS CONTIN) 30 MG 12 hr tablet Take 1 tablet by mouth every 8  hours. 90 tablet 0   pantoprazole (PROTONIX) 40 MG tablet Take 1 tablet (40 mg total) by mouth 2 (two) times daily before a meal. 60 tablet 3   prochlorperazine (COMPAZINE) 10 MG tablet Take 1 tablet by mouth every 6 hours as needed for nausea or vomiting. 60  tablet 2   sucralfate (CARAFATE) 1 g tablet Take 1 tablet by mouth 4 times daily. (Dissolve in 15 mls of water before taking) 120 tablet 2   No current facility-administered medications for this visit.   Facility-Administered Medications Ordered in Other Visits  Medication Dose Route Frequency Provider Last Rate Last Admin   diphenhydrAMINE (BENADRYL) 50 MG/ML injection            diphenhydrAMINE (BENADRYL) 50 MG/ML injection            famotidine (PEPCID) 20 mg IVPB            famotidine (PEPCID) 20 mg IVPB            palonosetron (ALOXI) 0.25 MG/5ML injection            palonosetron (ALOXI) 0.25 MG/5ML injection             PHYSICAL EXAMINATION: ECOG PERFORMANCE STATUS: 2 - Symptomatic, <50% confined to bed  Vitals:   01/04/21 1014  BP: 117/81  Pulse: (!) 134  Resp: 18  Temp: 99.6 F (37.6 C)  SpO2: 96%   Wt Readings from Last 3 Encounters:  01/04/21 155 lb 14.4 oz (70.7 kg)  12/28/20 152 lb 14.4 oz (69.4 kg)  12/24/20 150 lb (68 kg)     GENERAL:alert, no distress and comfortable SKIN: skin color, texture, turgor are normal, no rashes or significant lesions EYES: normal, Conjunctiva are pink and non-injected, sclera clear  LUNGS: clear to auscultation and percussion with normal breathing effort HEART: regular rate & rhythm and no murmurs and no lower extremity edema NEURO: alert & oriented x 3 with fluent speech, no focal motor/sensory deficits  LABORATORY DATA:  I have reviewed the data as listed CBC Latest Ref Rng & Units 01/04/2021 12/14/2020 11/23/2020  WBC 4.0 - 10.5 K/uL 5.3 3.9(L) 8.9  Hemoglobin 13.0 - 17.0 g/dL 10.0(L) 11.0(L) 12.6(L)  Hematocrit 39.0 - 52.0 % 30.0(L) 32.6(L) 36.6(L)  Platelets 150 - 400 K/uL 264 162 161     CMP Latest Ref Rng & Units 01/04/2021 12/14/2020 11/23/2020  Glucose 70 - 99 mg/dL 120(H) 120(H) 100(H)  BUN 6 - 20 mg/dL <4(L) 5(L) 13  Creatinine 0.61 - 1.24 mg/dL 0.63 0.59(L) 0.61  Sodium 135 - 145 mmol/L 139 137 135  Potassium  3.5 - 5.1 mmol/L 3.5 3.6 3.4(L)  Chloride 98 - 111 mmol/L 105 102 101  CO2 22 - 32 mmol/L  _0 Calcium 8.9 - 10.3 mg/dL 8.5(L) 9.2 8.4(L)  Total Protein 6.5 - 8.1 g/dL 6.7 7.2 6.4(L)  Total Bilirubin 0.3 - 1.2 mg/dL 0.4 0.6 0.5  Alkaline Phos 38 - 126 U/L 80 93 83  AST 15 - 41 U/L 20 21 32  ALT 0 - 44 U/L 8 9 40      RADIOGRAPHIC STUDIES: I have personally reviewed the radiological images as listed and agreed with the findings in the report. No results found.    No orders of the defined types were placed in this encounter.  All questions were answered. The patient knows to call the clinic with any problems, questions or concerns. No barriers to learning was detected. The total time spent in the appointment was 30 minutes.     [name], MD 01/04/2021   I, Wilburn Mylar, am acting as scribe for [name], MD.   I have reviewed the above documentation for accuracy and completeness, and I agree with the above.

## 2021-01-04 NOTE — Telephone Encounter (Signed)
RN call to palliative patient to check in. Spoke with daughter who reports patient continues to receive treatment but doing ok. Scheduled follow up for palliative care for 02/06/21 @ 1 pm

## 2021-01-04 NOTE — Progress Notes (Signed)
Per Dr. Burr Medico, "OK To Treat with elevated HR".

## 2021-01-05 ENCOUNTER — Encounter: Payer: Self-pay | Admitting: Hematology

## 2021-01-05 ENCOUNTER — Encounter: Payer: Self-pay | Admitting: Physician Assistant

## 2021-01-07 ENCOUNTER — Other Ambulatory Visit (HOSPITAL_COMMUNITY): Payer: Self-pay

## 2021-01-07 ENCOUNTER — Other Ambulatory Visit: Payer: Self-pay

## 2021-01-07 ENCOUNTER — Inpatient Hospital Stay: Payer: Self-pay | Admitting: Nurse Practitioner

## 2021-01-07 ENCOUNTER — Inpatient Hospital Stay: Payer: Self-pay

## 2021-01-07 VITALS — HR 125 | Temp 99.0°F | Resp 16

## 2021-01-07 DIAGNOSIS — C154 Malignant neoplasm of middle third of esophagus: Secondary | ICD-10-CM

## 2021-01-07 MED ORDER — PEGFILGRASTIM-CBQV 6 MG/0.6ML ~~LOC~~ SOSY
6.0000 mg | PREFILLED_SYRINGE | Freq: Once | SUBCUTANEOUS | Status: AC
  Administered 2021-01-07: 6 mg via SUBCUTANEOUS
  Filled 2021-01-07: qty 0.6

## 2021-01-08 ENCOUNTER — Other Ambulatory Visit (HOSPITAL_COMMUNITY): Payer: Self-pay

## 2021-01-09 ENCOUNTER — Other Ambulatory Visit: Payer: Self-pay | Admitting: Hematology

## 2021-01-09 ENCOUNTER — Encounter: Payer: Self-pay | Admitting: Hematology

## 2021-01-09 ENCOUNTER — Encounter: Payer: Self-pay | Admitting: Physician Assistant

## 2021-01-09 ENCOUNTER — Other Ambulatory Visit (HOSPITAL_COMMUNITY): Payer: Self-pay

## 2021-01-09 MED ORDER — HYDROCODONE-ACETAMINOPHEN 7.5-325 MG/15ML PO SOLN
10.0000 mL | Freq: Four times a day (QID) | ORAL | 0 refills | Status: DC | PRN
  Filled 2021-01-09 – 2021-01-10 (×2): qty 946, 24d supply, fill #0

## 2021-01-10 ENCOUNTER — Other Ambulatory Visit: Payer: Self-pay | Admitting: Hematology

## 2021-01-10 ENCOUNTER — Encounter: Payer: Self-pay | Admitting: Hematology

## 2021-01-10 ENCOUNTER — Other Ambulatory Visit (HOSPITAL_COMMUNITY): Payer: Self-pay

## 2021-01-10 ENCOUNTER — Encounter: Payer: Self-pay | Admitting: Physician Assistant

## 2021-01-10 MED ORDER — HYDROCODONE-ACETAMINOPHEN 5-325 MG PO TABS
1.0000 | ORAL_TABLET | Freq: Four times a day (QID) | ORAL | 0 refills | Status: DC | PRN
  Filled 2021-01-10: qty 45, 12d supply, fill #0

## 2021-01-11 ENCOUNTER — Other Ambulatory Visit (HOSPITAL_COMMUNITY): Payer: Self-pay

## 2021-01-14 ENCOUNTER — Other Ambulatory Visit (HOSPITAL_COMMUNITY): Payer: Self-pay

## 2021-01-17 ENCOUNTER — Encounter: Payer: Self-pay | Admitting: Nurse Practitioner

## 2021-01-24 ENCOUNTER — Other Ambulatory Visit: Payer: Self-pay | Admitting: Hematology

## 2021-01-24 ENCOUNTER — Other Ambulatory Visit (HOSPITAL_COMMUNITY): Payer: Self-pay

## 2021-01-24 ENCOUNTER — Encounter: Payer: Self-pay | Admitting: Physician Assistant

## 2021-01-24 ENCOUNTER — Encounter: Payer: Self-pay | Admitting: Hematology

## 2021-01-24 MED ORDER — HYDROCODONE-ACETAMINOPHEN 7.5-325 MG/15ML PO SOLN
10.0000 mL | Freq: Four times a day (QID) | ORAL | 0 refills | Status: DC | PRN
  Filled 2021-01-24: qty 473, 12d supply, fill #0

## 2021-01-25 ENCOUNTER — Inpatient Hospital Stay (HOSPITAL_BASED_OUTPATIENT_CLINIC_OR_DEPARTMENT_OTHER): Payer: Self-pay | Admitting: Nurse Practitioner

## 2021-01-25 ENCOUNTER — Encounter: Payer: Self-pay | Admitting: Hematology

## 2021-01-25 ENCOUNTER — Ambulatory Visit (HOSPITAL_COMMUNITY)
Admission: RE | Admit: 2021-01-25 | Discharge: 2021-01-25 | Disposition: A | Discharge status: Home / Self Care | Payer: Self-pay | Source: Ambulatory Visit | Attending: Hematology | Admitting: Hematology

## 2021-01-25 ENCOUNTER — Other Ambulatory Visit: Payer: Self-pay

## 2021-01-25 ENCOUNTER — Encounter: Payer: Self-pay | Admitting: Physician Assistant

## 2021-01-25 ENCOUNTER — Inpatient Hospital Stay: Payer: Self-pay

## 2021-01-25 ENCOUNTER — Inpatient Hospital Stay (HOSPITAL_BASED_OUTPATIENT_CLINIC_OR_DEPARTMENT_OTHER): Payer: Self-pay | Admitting: Hematology

## 2021-01-25 ENCOUNTER — Other Ambulatory Visit (HOSPITAL_COMMUNITY): Payer: Self-pay

## 2021-01-25 VITALS — BP 124/80 | HR 109 | Temp 99.0°F | Resp 18 | Ht 67.0 in | Wt 154.7 lb

## 2021-01-25 DIAGNOSIS — C154 Malignant neoplasm of middle third of esophagus: Secondary | ICD-10-CM

## 2021-01-25 DIAGNOSIS — K920 Hematemesis: Secondary | ICD-10-CM

## 2021-01-25 DIAGNOSIS — G893 Neoplasm related pain (acute) (chronic): Secondary | ICD-10-CM

## 2021-01-25 DIAGNOSIS — Z95828 Presence of other vascular implants and grafts: Secondary | ICD-10-CM

## 2021-01-25 DIAGNOSIS — Z515 Encounter for palliative care: Secondary | ICD-10-CM

## 2021-01-25 DIAGNOSIS — C7951 Secondary malignant neoplasm of bone: Secondary | ICD-10-CM

## 2021-01-25 DIAGNOSIS — R634 Abnormal weight loss: Secondary | ICD-10-CM

## 2021-01-25 DIAGNOSIS — K5903 Drug induced constipation: Secondary | ICD-10-CM

## 2021-01-25 LAB — CBC WITH DIFFERENTIAL/PLATELET
Abs Immature Granulocytes: 0.03 10*3/uL (ref 0.00–0.07)
Basophils Absolute: 0 10*3/uL (ref 0.0–0.1)
Basophils Relative: 1 %
Eosinophils Absolute: 0.1 10*3/uL (ref 0.0–0.5)
Eosinophils Relative: 2 %
HCT: 28.2 % — ABNORMAL LOW (ref 39.0–52.0)
Hemoglobin: 9.3 g/dL — ABNORMAL LOW (ref 13.0–17.0)
Immature Granulocytes: 1 %
Lymphocytes Relative: 10 %
Lymphs Abs: 0.6 10*3/uL — ABNORMAL LOW (ref 0.7–4.0)
MCH: 32.1 pg (ref 26.0–34.0)
MCHC: 33 g/dL (ref 30.0–36.0)
MCV: 97.2 fL (ref 80.0–100.0)
Monocytes Absolute: 1.1 10*3/uL — ABNORMAL HIGH (ref 0.1–1.0)
Monocytes Relative: 20 %
Neutro Abs: 3.8 10*3/uL (ref 1.7–7.7)
Neutrophils Relative %: 66 %
Platelets: 233 10*3/uL (ref 150–400)
RBC: 2.9 MIL/uL — ABNORMAL LOW (ref 4.22–5.81)
RDW: 14.7 % (ref 11.5–15.5)
WBC: 5.7 10*3/uL (ref 4.0–10.5)
nRBC: 0 % (ref 0.0–0.2)

## 2021-01-25 LAB — COMPREHENSIVE METABOLIC PANEL
ALT: 9 U/L (ref 0–44)
AST: 17 U/L (ref 15–41)
Albumin: 3.7 g/dL (ref 3.5–5.0)
Alkaline Phosphatase: 64 U/L (ref 38–126)
Anion gap: 9 (ref 5–15)
BUN: 5 mg/dL — ABNORMAL LOW (ref 6–20)
CO2: 23 mmol/L (ref 22–32)
Calcium: 9.1 mg/dL (ref 8.9–10.3)
Chloride: 103 mmol/L (ref 98–111)
Creatinine, Ser: 0.42 mg/dL — ABNORMAL LOW (ref 0.61–1.24)
GFR, Estimated: >60 mL/min (ref >60)
Glucose, Bld: 85 mg/dL (ref 70–99)
Potassium: 3.7 mmol/L (ref 3.5–5.1)
Sodium: 135 mmol/L (ref 135–145)
Total Bilirubin: 0.4 mg/dL (ref 0.3–1.2)
Total Protein: 7.1 g/dL (ref 6.5–8.1)

## 2021-01-25 MED ORDER — HEPARIN SOD (PORK) LOCK FLUSH 100 UNIT/ML IV SOLN: 500.0000 [IU] | Freq: Once | INTRAVENOUS | Status: DC

## 2021-01-25 MED ORDER — SODIUM CHLORIDE 0.9 % IV SOLN
Freq: Once | INTRAVENOUS | Status: AC
Start: 2021-01-25 — End: 2021-01-25

## 2021-01-25 MED ORDER — SODIUM CHLORIDE 0.9 % IV SOLN
75.0000 mg/m2 | Freq: Once | INTRAVENOUS | Status: AC
  Administered 2021-01-25: 12:00:00 140 mg via INTRAVENOUS
  Filled 2021-01-25: qty 14

## 2021-01-25 MED ORDER — SODIUM CHLORIDE 0.9 % IV SOLN
16.0000 mg | Freq: Once | INTRAVENOUS | Status: AC
  Administered 2021-01-25: 11:00:00 16 mg via INTRAVENOUS
  Filled 2021-01-25: qty 8

## 2021-01-25 MED ORDER — SODIUM CHLORIDE 0.9% FLUSH
10.0000 mL | INTRAVENOUS | Status: DC | PRN
  Administered 2021-01-25: 13:00:00 10 mL

## 2021-01-25 MED ORDER — SODIUM CHLORIDE 0.9% FLUSH
10.0000 mL | INTRAVENOUS | Status: DC | PRN
  Administered 2021-01-25: 10:00:00 10 mL via INTRAVENOUS

## 2021-01-25 MED ORDER — MEGESTROL ACETATE 20 MG PO TABS
20.0000 mg | ORAL_TABLET | Freq: Two times a day (BID) | ORAL | 0 refills | Status: DC
  Filled 2021-01-25: qty 60, 30d supply, fill #0

## 2021-01-25 MED ORDER — HEPARIN SOD (PORK) LOCK FLUSH 100 UNIT/ML IV SOLN
500.0000 [IU] | Freq: Once | INTRAVENOUS | Status: AC | PRN
  Administered 2021-01-25: 13:00:00 500 [IU]

## 2021-01-25 MED ORDER — PROCHLORPERAZINE MALEATE 10 MG PO TABS
10.0000 mg | ORAL_TABLET | Freq: Four times a day (QID) | ORAL | 2 refills | Status: AC | PRN
  Filled 2021-01-25: qty 60, 15d supply, fill #0
  Filled 2021-02-10: qty 60, 15d supply, fill #1

## 2021-01-25 MED ORDER — SODIUM CHLORIDE 0.9 % IV SOLN
10.0000 mg | Freq: Once | INTRAVENOUS | Status: AC
  Administered 2021-01-25: 11:00:00 10 mg via INTRAVENOUS
  Filled 2021-01-25: qty 10

## 2021-01-25 NOTE — Progress Notes (Signed)
Jimmy Hawkins   [phone]) [phone] [phone]) [phone]   Clinic Follow up Note   Patient Care Team: [name], MD as PCP - General (Hematology) [name], MD as Consulting Physician (Oncology) Pickenpack-Cousar, Carlena Sax, NP as Nurse Practitioner (Nurse Practitioner)  Date of Service:  01/25/2021  CHIEF COMPLAINT: f/u of metastatic esophageal cancer  CURRENT THERAPY:  Docetaxel, starting 11/23/20  ASSESSMENT & PLAN:  Jimmy Hawkins is a 45 y.o. male with   1. 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 08/11/19 CT CAP found him to have mid esophageal mass and enlarged AP window node, no distant metastasis. -He completed 6 weeks of concurrent chemoRT with weekly Carboplatin and Taxol 08/29/19-10/06/19 -Repeat PET scan 10/2019 showed hypermetabolic wall thickening of the distal half of the thoracic esophageus, no notable distant metastasis.  -He received CAPOX q3weeks with Xeloda 1575m BID 2 weeks on/1 week off 12/20/19-02/2020. -He completed palliative Radiation to sacrum with Dr MLisbeth Renshaw2/28/22-3/15/22.  -He started First-line single agent Keytruda 04/27/20. Due to his slow improvement and squamous cell histology, we added chemo with carboplatin and Taxol 2 weeks on/1 week off from C4 on 06/29/20.  -restaging PET 11/08/20 showed worsening diease to the left sacral lesion and mild increase in activity to esophageal mass. No new metastasis. -he had port placed on 12/07/20 -he switched to docetaxel on 11/23/20. He has been tolerating moderately well overall, his pain is stable on his current regimen.  -lab reviewed, adequate for treatment, will proceed to cycle 4 docetaxel today -repeat scan in next 2-3 weeks   2. Symptom Management: Dysphagia, weight loss, SOB, LLE edema -he is undergoing esophageal dilation under Dr.  GCarlean Purl most recently 12/24/20. He reports improved swallowing with no further issues. -we switched him to megace on 01/04/21. I refilled today. -he also reports today he is having trouble catching his breath -he reports worsening left LE edema. I will order doppler to rule out DVT. -He will see NP Nikki today.   3. Sacral/back pain, LE weakness, secondary to bone metastasis -He was seen to have bone mets on 04/11/20 PET scan. -He completed palliative Radiation to sacrum with Dr MLisbeth Renshaw2/28/22-3/15/22. He was given short course dexa 451m  -restaging PET 11/08/20 showed continued worsening to left sacral metastatic lesion with erosion across SI joint into left iliac bone, left sacrum, and sacral spinal canal. -He is currently taking hydrocodone 10 cc and MS Contin 30 mg q8hr. His wife notes sometimes oxycodone help but not always.  -our palliative care nurse Nikki increased his gabapentin to 300 mg TID. I advised he can increase to 600 mg at night.   4. Goal of care discussion, DNR  -We previously discussed the incurable nature of his cancer, and the overall poor prognosis, especially if he does not have good response to chemotherapy or progress on chemo -The patient understands the goal of care is palliative. -I recommend DNR/DNI, he agreed 11/09/20   5. H/o Heavy alcohol user -He quit drinking since cancer diagnosis, but started again. Due to weakness, he is not able to drink as much since 04/13/20 and stopped overall 06/29/20 -on 10/12/20, he reported he is back to drinking 5 beers a day. I previously advised him to abstain for the day of and a few following chemo.   6. Social and Financial Support  -Patient does not have insurance and is not  currently working. He has already applied for Medicaid. -He has met with financial advocate Shauna and SW and he has grant assistance with medications.     PLAN: -palliative nurse Lexine Baton will see him today -proceed with C4 docetaxel today              -udenyca on day 3 (Monday) -I will refill his megace and compazine -f/u and C4 in 3 weeks, with lab/flush and PET several days before   No problem-specific Assessment & Plan notes found for this encounter.   SUMMARY OF ONCOLOGIC HISTORY: Oncology History Overview Note  Cancer Staging Malignant neoplasm of middle third of esophagus ([place]) Staging form: Esophagus - Squamous Cell Carcinoma, AJCC 8th Edition - Clinical: Stage Unknown (cTX, cN1, cM0) - Signed by [name], MD on 08/18/2019    Malignant neoplasm of middle third of esophagus [facility])   Initial Diagnosis   Malignant neoplasm of middle third of esophagus ([place])   08/11/2019 Imaging   CT CAP W contrast 08/11/19 IMPRESSION: 1. Mid to lower esophageal mass along an 8 cm segment, large endoluminal component, strongly favoring esophageal malignancy. Borderline enlarged AP window lymph node. No findings of distant metastatic disease. 2. Trace bilateral pleural effusions. 3. Diffuse hepatic steatosis. 4. Cholelithiasis. 5. Fatty spermatic cords likely due to small bilateral indirect inguinal hernias.   08/11/2019 Procedure   EGD by Dr Carlean Purl  IMPRESSION - Partially obstructing, likely malignant esophageal tumor was found in the upper third of the esophagus and in the middle third of the esophagus. Biopsied. Very large and long - difficult but able to advance scope by this lesion - await CT for better length estimate - Normal stomach. - Normal examined duodenum.   08/11/2019 Initial Biopsy   FINAL MICROSCOPIC DIAGNOSIS:   A. ESOPHAGUS, UPPER, BIOPSY:  - Squamous cell carcinoma.    08/11/2019 Genetic Testing   PD-L1 Expression 90%   08/18/2019 Cancer Staging   Staging form: Esophagus - Squamous Cell Carcinoma, AJCC 8th Edition - Clinical: Stage Unknown (cTX, cN1, cM0) - Signed by [name], MD on 08/18/2019    08/29/2019 - 10/04/2019 Chemotherapy   Concurrent chemoradiation with weekly CT for 6 weeks starting 08/29/19-10/04/19     08/29/2019 - 10/06/2019 Radiation Therapy   Concurrent chemoradiation with Dr Lisbeth Renshaw starting 08/29/19-10/06/19   11/11/2019 PET scan   IMPRESSION: 1. Again seen is a long segment of FDG avid wall thickening of the distal half of the thoracic esophagus compatible with known esophageal neoplasm. 2. No signs of FDG avid nodal or distant metastatic disease.     11/28/2019 Procedure   EUS by Dr Rush Landmark IMPRESSION EGD Impression: - No gross lesions in esophagus proximally. - Previously noted esophageal cancer is not present. However, in its place and extending distal to this is likely LA Grade D chemotherapy/radiation esophagitis with bleeding. Biopsied after the EUS was complete to evaluate for persistent disease or not. - Z-line regular, 37 cm from the incisors. - 3 cm hiatal hernia. - Erythematous mucosa in the stomach. No other gross lesions in the stomach. Biopsied. - No gross lesions in the duodenal bulb, in the first portion of the duodenum and in the second portion of the duodenum. EUS Impression: - Wall thickening was seen in the thoracic esophagus into the gastroesophageal junction. The thickening appeared to primarily be within the luminal interface/superficial mucosa (Layer 1) and deep mucosa (Layer 2). The previously noted esophageal cancer has been replaced by what appears to be radiation/chemotherapy induced changes. With the limitations  of having to use the mini-probe EUS, I could not visualize persistent significant disease other than the entire distal area of the esophagus having likely changes noted as above. - No malignant-appearing lymph nodes were visualized in the middle paraesophageal mediastinum (level 27M), lower paraesophageal mediastinum (level 8L), diaphragmatic region (level 15) and paracardial region (level 16).      FINAL MICROSCOPIC DIAGNOSIS:   A. STOMACH, BIOPSY:  - Mildly active chronic gastritis.  - Warthin-Starry negative for Helicobacter pylori.   - No intestinal metaplasia, dysplasia or carcinoma.   B. ESOPHAGUS, BIOPSY:  - Inflamed granulation tissue and exudate consistent with ulcer.  - No malignancy identified.    12/20/2019 - 02/2020 Chemotherapy   CAPOX q3weeks with Xeloda 1536m BID 2 weeks on/1 week off starting 12/20/19-02/2020   03/21/2020 [facility]   [facility]--Circumferential thickening of the lower third of the esophagus which corresponds with known esophageal malignancy. No evidence of metastatic disease within the chest.  --For findings below the diaphragm, please see concurrent separately reported CT of the abdomen and pelvis.  --Expansile lytic mass with associated soft tissue component centered in the left sacral ala and abutting the left S1 and S2 nerve roots, left internal iliac vasculature, and left pyriformis muscle with no other metastatic disease within the abdomen or pelvis.   --Diffuse thickening of the distal esophagus compatible with known esophageal malignancy.   --Cholelithiasis with nonspecific gallbladder distention. Clinical correlation is recommended. If there is clinical concern for cholecystitis, further evaluation with ultrasound can be obtained.   03/21/2020 Imaging   CT Lumbar Spine  Expansile lytic lesion centered in the left sacral ala with no other evidence of metastatic disease in the lumbar spine.   03/26/2020 - 04/10/2020 Radiation Therapy   Palliative Radiation to Sacrum with Dr MLisbeth Renshaw2/28/22-3/15/22.    04/11/2020 Progression   PET  IMPRESSION: 1. New hypermetabolic expansile 6.1 cm upper left sacral lytic bone metastasis, centrally necrotic. 2. New hypermetabolic left level 1b and left level 2 neck lymph nodes, indeterminate for reactive versus metastatic nodes. 3. Persistent hypermetabolism associated with a short segment of mid to lower thoracic esophagus with associated circumferential esophageal wall thickening, similar in metabolism and decreased in wall thickness since  11/11/2019 PET-CT, nonspecific, favor post treatment change, with residual neoplasm not excluded. 4. No additional sites of hypermetabolic metastatic disease. 5.  Chronic findings include: Cholelithiasis.   04/27/2020 -  Chemotherapy   First-line with immunotherapy Keytruda q3weeks starting 04/27/20       06/29/2020 -  Chemotherapy   Weekly carbo AUC 1.5 and Taxol 667mm2 on day 1, 8 every 21 days added to first-line Keytruda from C4 on 06/29/20.     07/17/2020 PET scan   IMPRESSION: 1. Left sacral metastasis is slightly enlarged and slightly increased in peripheral hypermetabolism. No additional evidence of metastatic disease. 2. Similar mild hypermetabolism associated with the mid esophagus. No residual hypermetabolism within left neck lymph nodes. 3. Small pericardial effusion. 4. Trace bilateral pleural effusions. 5. Cholelithiasis.     09/14/2020 Procedure   Upper Endoscopy  Impression: - Esophagitis. Biopsied. - Esophageal stenosis. Dilated. Biopsied. this looks most like a mid-distal inflammatory stricture (? post XRT +/- reflux, Candida?) scope passed through but styill felt gerip on scope so did not advance deeper into stomach to avoid trauma. - Normal cardia.    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 - NO EVIDENCE OF DYSPLASIA OR MALIGNANCY. - SEE  MICROSCOPIC DESCRIPTION. Microscopic Comment While histologic changes of radiation effects are not apparent in the inflamed granulation tissue, the presence of ulceration can be indicative of radiation effects.   11/08/2020 PET scan   IMPRESSION: 1. Substantial (about 70%) increase in volume of the left sacral metastatic lesion which has central necrosis and peripheral hypermetabolic activity. This lesion now has increased erosion across the SI joint into the left iliac bone and into the left sacrum and sacral spinal canal. 2. Mild increase in  activity in the thickened segment of mid to distal esophagus corresponding to the original mass, current maximum SUV 6.2 (formerly 4.6). 3. Stable small posterior pericardial effusion.   11/23/2020 -  Chemotherapy   Patient is on Treatment Plan : LUNG Docetaxel q21d        INTERVAL HISTORY:  Jimmy Hawkins is here for a follow up of metastatic esophageal cancer. He was last seen by me on 01/04/21. He presents to the clinic accompanied by his sister(?) and our interpreter Almyra Free. He reports he is having shortness of breath.  He also reports left ankle swelling, which has been present previously but he feels it is getting worse. He denies numbness or tingling.   All other systems were reviewed with the patient and are negative.  MEDICAL HISTORY:  Past Medical History:  Diagnosis Date   Blood transfusion without reported diagnosis    Cramps of left lower extremity 2021   Hematemesis with nausea 08/10/2019   Squamous cell carcinoma, esophagus ([place]) 04/2019   metastatic    SURGICAL HISTORY: Past Surgical History:  Procedure Laterality Date   BIOPSY  08/11/2019   Procedure: BIOPSY;  Surgeon: [name], MD;  Location: [facility] ENDOSCOPY;  Service: Endoscopy;;   BIOPSY  11/28/2019   Procedure: BIOPSY;  Surgeon: [name], MD;  Location: [facility] ENDOSCOPY;  Service: Gastroenterology;;   ESOPHAGOGASTRODUODENOSCOPY  08/11/2019   ESOPHAGOGASTRODUODENOSCOPY (EGD) WITH PROPOFOL N/A 08/11/2019   Procedure: ESOPHAGOGASTRODUODENOSCOPY (EGD) WITH PROPOFOL;  Surgeon: [name], MD;  Location: [place];  Service: Endoscopy;  Laterality: N/A;   ESOPHAGOGASTRODUODENOSCOPY (EGD) WITH PROPOFOL N/A 11/28/2019   Procedure: ESOPHAGOGASTRODUODENOSCOPY (EGD) WITH PROPOFOL;  Surgeon: Rush Landmark Telford Nab., MD;  Location: [place];  Service: Gastroenterology;  Laterality: N/A;   EUS N/A 11/28/2019   Procedure: UPPER ENDOSCOPIC ULTRASOUND (EUS) RADIAL;  Surgeon: [name], MD;  Location: [place];  Service: Gastroenterology;  Laterality: N/A;   IR IMAGING GUIDED PORT INSERTION  12/07/2020    I have reviewed the social history and family history with the patient and they are unchanged from previous note.  ALLERGIES:  has No Known Allergies.  MEDICATIONS:  Current Outpatient Medications  Medication Sig Dispense Refill   dexamethasone (DECADRON) 4 MG tablet Take 1 tablet (4 mg total) by mouth 2 (two) times daily. Start the day before Taxotere. Then daily after chemo for 2 days. 15 tablet 1   diphenhydrAMINE-APAP, sleep, (TYLENOL PM EXTRA STRENGTH) 50-1000 MG/30ML LIQD Take 15 mLs by mouth at bedtime as needed (sleep).     famotidine (PEPCID) 20 MG tablet TOME 1 PASTILLA POR VIA ORAL DOS VECES AL DIA 60 tablet 3   Ferrous Sulfate (IRON) 325 (65 Fe) MG TABS Take 1 tablet (325 mg total) by mouth 2 (two) times daily. 60 tablet 3   ferrous sulfate 325 (65 FE) MG tablet TAKE 1 TABLET BY MOUTH 2 TIMES DAILY. 60 tablet 3   gabapentin (NEURONTIN) 300 MG capsule Take 1 capsule (300 mg total) by  mouth 3 (three) times daily. 90 capsule 0   HYDROcodone-acetaminophen (HYCET) 7.5-325 mg/15 ml solution Take 10 mLs by mouth every 6 (six) hours as needed for moderate pain. 473 mL 0   megestrol (MEGACE) 20 MG tablet Take 1 tablet by mouth 2 times daily. 60 tablet 0   morphine (MS CONTIN) 30 MG 12 hr tablet Take 1 tablet by mouth every 8  hours. 90 tablet 0   pantoprazole (PROTONIX) 40 MG tablet Take 1 tablet (40 mg total) by mouth 2 (two) times daily before a meal. 60 tablet 3   prochlorperazine (COMPAZINE) 10 MG tablet Take 1 tablet by mouth every 6 hours as needed for nausea or vomiting. 60 tablet 2   sucralfate (CARAFATE) 1 g tablet Take 1 tablet by mouth 4 times daily. (Dissolve in 15 mls of water before taking) 120 tablet 2   No current facility-administered medications for this visit.   Facility-Administered Medications Ordered in Other Visits  Medication  Dose Route Frequency Provider Last Rate Last Admin   dexamethasone (DECADRON) 10 mg in sodium chloride 0.9 % 50 mL IVPB  10 mg Intravenous Once [name], MD 204 mL/hr at 01/25/21 1051 10 mg at 01/25/21 1051   diphenhydrAMINE (BENADRYL) 50 MG/ML injection            diphenhydrAMINE (BENADRYL) 50 MG/ML injection            DOCEtaxel (TAXOTERE) 140 mg in sodium chloride 0.9 % 250 mL chemo infusion  75 mg/m2 (Treatment Plan Recorded) Intravenous Once [name], MD       famotidine (PEPCID) 20 mg IVPB            famotidine (PEPCID) 20 mg IVPB            heparin lock flush 100 unit/mL  500 Units Intracatheter Once PRN [name], MD       ondansetron (ZOFRAN) 16 mg in sodium chloride 0.9 % 50 mL IVPB  16 mg Intravenous Once [name], MD       palonosetron (ALOXI) 0.25 MG/5ML injection            palonosetron (ALOXI) 0.25 MG/5ML injection            sodium chloride flush (NS) 0.9 % injection 10 mL  10 mL Intracatheter PRN [name], MD        PHYSICAL EXAMINATION: ECOG PERFORMANCE STATUS: 3 - Symptomatic, >50% confined to bed  Vitals:   01/25/21 1006  BP: 124/80  Pulse: (!) 109  Resp: 18  Temp: 99 F (37.2 C)  SpO2: 99%   Wt Readings from Last 3 Encounters:  01/25/21 154 lb 11.2 oz (70.2 kg)  01/04/21 155 lb 14.4 oz (70.7 kg)  12/28/20 152 lb 14.4 oz (69.4 kg)     GENERAL:alert, no distress and comfortable SKIN: skin color normal, no rashes or significant lesions EYES: normal, Conjunctiva are pink and non-injected, sclera clear  HEART: (+) left lower extremity edema NEURO: alert & oriented x 3 with fluent speech  LABORATORY DATA:  I have reviewed the data as listed CBC Latest Ref Rng & Units 01/25/2021 01/04/2021 12/14/2020  WBC 4.0 - 10.5 K/uL 5.7 5.3 3.9(L)  Hemoglobin 13.0 - 17.0 g/dL 9.3(L) 10.0(L) 11.0(L)  Hematocrit 39.0 - 52.0 % 28.2(L) 30.0(L) 32.6(L)  Platelets 150 - 400 K/uL 233 264 162     CMP Latest Ref Rng & Units 01/25/2021 01/04/2021 12/14/2020  Glucose 70 - 99  mg/dL 85 120(H) 120(H)  BUN 6 -  20 mg/dL 5(L) <4(L) 5(L)  Creatinine 0.61 - 1.24 mg/dL 0.42(L) 0.63 0.59(L)  Sodium 135 - 145 mmol/L 135 139 137  Potassium 3.5 - 5.1 mmol/L 3.7 3.5 3.6  Chloride 98 - 111 mmol/L 103 105 102  CO2 22 - 32 mmol/L '23 22 25  ' Calcium 8.9 - 10.3 mg/dL 9.1 8.5(L) 9.2  Total Protein 6.5 - 8.1 g/dL 7.1 6.7 7.2  Total Bilirubin 0.3 - 1.2 mg/dL 0.4 0.4 0.6  Alkaline Phos 38 - 126 U/L 64 80 93  AST 15 - 41 U/L '17 20 21  ' ALT 0 - 44 U/L '9 8 9      ' RADIOGRAPHIC STUDIES: I have personally reviewed the radiological images as listed and agreed with the findings in the report. No results found.    Orders Placed This Encounter  Procedures   NM PET Image Restag (PS) Skull Base To Thigh    Standing Status:   Future    Standing Expiration Date:   01/25/2022    Order Specific Question:   If indicated for the ordered procedure, I authorize the administration of a radiopharmaceutical per Radiology protocol    Answer:   Yes    Order Specific Question:   Preferred imaging location?    Answer:   [facility]   All questions were answered. The patient knows to call the clinic with any problems, questions or concerns. No barriers to learning was detected. The total time spent in the appointment was 30 minutes.     [name], MD 01/25/2021   I, Wilburn Mylar, am acting as scribe for [name], MD.   I have reviewed the above documentation for accuracy and completeness, and I agree with the above.

## 2021-01-25 NOTE — Progress Notes (Signed)
[place]  [phone] [phone]   Name: Jimmy Hawkins Date: 01/25/2021 MRN: [id number]  DOB: 07-29-1975  Patient Care Team: [name], MD as PCP - General (Hematology) [name], MD as Consulting Physician (Oncology) Pickenpack-Cousar, Carlena Sax, NP as Nurse Practitioner (Nurse Practitioner)    INTERVAL HISTORY: Jimmy Hawkins is a 45 y.o. 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  Palliative ask to see for symptom management and goals of care.   SOCIAL HISTORY:     reports that he has never smoked. He has never used smokeless tobacco. He reports current alcohol use of about 30.0 standard drinks per week. He reports that he does not use drugs.  ADVANCE DIRECTIVES:  Patient reports he does not have a documented advanced directive.  MOST form introduced.  Patient expressed wishes to further discuss at his follow-up visit.  He did express wishes for DNR/DNI.  Out a facility form has been completed and given to patient and his sister.  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 Mid to lower esophageal mass along an 8 cm segment, large endoluminal component, strongly favoring esophageal malignancy. Borderline enlarged AP window lymph node. No findings of distant metastatic disease. 2. Trace bilateral pleural effusions. 3. Diffuse hepatic steatosis. 4. Cholelithiasis. 5. Fatty spermatic cords likely due to small bilateral indirect inguinal hernias.   08/11/2019 Procedure   EGD by Dr Carlean Purl  IMPRESSION - Partially obstructing, likely malignant esophageal tumor was found in the upper third of the esophagus and in the middle third of the esophagus. Biopsied. Very large and long - difficult but able to advance scope by this lesion - await CT for better length estimate - Normal stomach. - Normal examined duodenum.   08/11/2019 Initial Biopsy   FINAL MICROSCOPIC DIAGNOSIS:   A. ESOPHAGUS, UPPER, BIOPSY:  - Squamous cell carcinoma.    08/11/2019 Genetic Testing   PD-L1 Expression 90%   08/18/2019 Cancer Staging   Staging form: Esophagus - Squamous Cell Carcinoma, AJCC 8th Edition - Clinical: Stage Unknown (cTX, cN1, cM0) - Signed by [name], MD on 08/18/2019    08/29/2019 - 10/04/2019 Chemotherapy   Concurrent chemoradiation with weekly CT for 6 weeks starting 08/29/19-10/04/19    08/29/2019 - 10/06/2019 Radiation Therapy   Concurrent chemoradiation with Dr Lisbeth Renshaw starting 08/29/19-10/06/19   11/11/2019 PET scan   IMPRESSION: 1. Again seen is a long segment of FDG avid wall thickening of the distal half of the thoracic esophagus compatible with known esophageal neoplasm. 2. No signs of FDG avid nodal or distant metastatic disease.     11/28/2019 Procedure   EUS by Dr  Mansouraty IMPRESSION EGD Impression: - No gross lesions in esophagus proximally. - Previously noted esophageal cancer is not present. However, in its place and extending distal to this is likely LA Grade D chemotherapy/radiation esophagitis with  bleeding. Biopsied after the EUS was complete to evaluate for persistent disease or not. - Z-line regular, 37 cm from the incisors. - 3 cm hiatal hernia. - Erythematous mucosa in the stomach. No other gross lesions in the stomach. Biopsied. - No gross lesions in the duodenal bulb, in the first portion of the duodenum and in the second portion of the duodenum. EUS Impression: - Wall thickening was seen in the thoracic esophagus into the gastroesophageal junction. The thickening appeared to primarily be within the luminal interface/superficial mucosa (Layer 1) and deep mucosa (Layer 2). The previously noted esophageal cancer has been replaced by what appears to be radiation/chemotherapy induced changes. With the limitations of having to use the mini-probe EUS, I could not visualize persistent significant disease other than the entire distal area of the esophagus having likely changes noted as above. - No malignant-appearing lymph nodes were visualized in the middle paraesophageal mediastinum (level 74M), lower paraesophageal mediastinum (level 8L), diaphragmatic region (level 15) and paracardial region (level 16).      FINAL MICROSCOPIC DIAGNOSIS:   A. STOMACH, BIOPSY:  - Mildly active chronic gastritis.  - Warthin-Starry negative for Helicobacter pylori.  - No intestinal metaplasia, dysplasia or carcinoma.   B. ESOPHAGUS, BIOPSY:  - Inflamed granulation tissue and exudate consistent with ulcer.  - No malignancy identified.    12/20/2019 - 02/2020 Chemotherapy   CAPOX q3weeks with Xeloda 1517m BID 2 weeks on/1 week off starting 12/20/19-02/2020   03/21/2020 [facility]   [facility]--Circumferential thickening of the lower third of the esophagus which corresponds with known esophageal malignancy. No evidence of metastatic disease within the chest.  --For findings below the diaphragm, please see concurrent separately reported CT of the abdomen and pelvis.  --Expansile lytic mass  with associated soft tissue component centered in the left sacral ala and abutting the left S1 and S2 nerve roots, left internal iliac vasculature, and left pyriformis muscle with no other metastatic disease within the abdomen or pelvis.   --Diffuse thickening of the distal esophagus compatible with known esophageal malignancy.   --Cholelithiasis with nonspecific gallbladder distention. Clinical correlation is recommended. If there is clinical concern for cholecystitis, further evaluation with ultrasound can be obtained.   03/21/2020 Imaging   CT Lumbar Spine  Expansile lytic lesion centered in the left sacral ala with no other evidence of metastatic disease in the lumbar spine.   03/26/2020 - 04/10/2020 Radiation Therapy   Palliative Radiation to Sacrum with Dr MLisbeth Renshaw2/28/22-3/15/22.    04/11/2020 Progression   PET  IMPRESSION: 1. New hypermetabolic expansile 6.1 cm upper left sacral lytic bone metastasis, centrally necrotic. 2. New hypermetabolic left level 1b and left level 2 neck lymph nodes, indeterminate for reactive versus metastatic nodes. 3. Persistent hypermetabolism associated with a short segment of mid to lower thoracic esophagus with associated circumferential esophageal wall thickening, similar in metabolism and decreased in wall thickness since 11/11/2019 PET-CT, nonspecific, favor post treatment change, with residual neoplasm not excluded. 4. No additional sites of hypermetabolic metastatic disease. 5.  Chronic findings include: Cholelithiasis.   04/27/2020 -  Chemotherapy   First-line with immunotherapy Keytruda q3weeks starting 04/27/20       06/29/2020 -  Chemotherapy   Weekly carbo AUC 1.5 and Taxol 64mm2 on day 1, 8  every 21 days added to first-line Keytruda from C4 on 06/29/20.     07/17/2020 PET scan   IMPRESSION: 1. Left sacral metastasis is slightly enlarged and slightly increased in peripheral hypermetabolism. No additional evidence of metastatic disease. 2.  Similar mild hypermetabolism associated with the mid esophagus. No residual hypermetabolism within left neck lymph nodes. 3. Small pericardial effusion. 4. Trace bilateral pleural effusions. 5. Cholelithiasis.     09/14/2020 Procedure   Upper Endoscopy  Impression: - Esophagitis. Biopsied. - Esophageal stenosis. Dilated. Biopsied. this looks most like a mid-distal inflammatory stricture (? post XRT +/- reflux, Candida?) scope passed through but styill felt gerip on scope so did not advance deeper into stomach to avoid trauma. - Normal cardia.    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 - NO EVIDENCE OF DYSPLASIA OR MALIGNANCY. - SEE MICROSCOPIC DESCRIPTION. Microscopic Comment While histologic changes of radiation effects are not apparent in the inflamed granulation tissue, the presence of ulceration can be indicative of radiation effects.   11/08/2020 PET scan   IMPRESSION: 1. Substantial (about 70%) increase in volume of the left sacral metastatic lesion which has central necrosis and peripheral hypermetabolic activity. This lesion now has increased erosion across the SI joint into the left iliac bone and into the left sacrum and sacral spinal canal. 2. Mild increase in activity in the thickened segment of mid to distal esophagus corresponding to the original mass, current maximum SUV 6.2 (formerly 4.6). 3. Stable small posterior pericardial effusion.   11/23/2020 -  Chemotherapy   Patient is on Treatment Plan : LUNG Docetaxel q21d       ALLERGIES:  has No Known Allergies.  MEDICATIONS:  Current Outpatient Medications  Medication Sig Dispense Refill   dexamethasone (DECADRON) 4 MG tablet Take 1 tablet (4 mg total) by mouth 2 (two) times daily. Start the day before Taxotere. Then daily after chemo for 2 days. 15 tablet 1   diphenhydrAMINE-APAP, sleep, (TYLENOL PM EXTRA STRENGTH) 50-1000  MG/30ML LIQD Take 15 mLs by mouth at bedtime as needed (sleep).     famotidine (PEPCID) 20 MG tablet TOME 1 PASTILLA POR VIA ORAL DOS VECES AL DIA 60 tablet 3   Ferrous Sulfate (IRON) 325 (65 Fe) MG TABS Take 1 tablet (325 mg total) by mouth 2 (two) times daily. 60 tablet 3   ferrous sulfate 325 (65 FE) MG tablet TAKE 1 TABLET BY MOUTH 2 TIMES DAILY. 60 tablet 3   gabapentin (NEURONTIN) 300 MG capsule Take 1 capsule (300 mg total) by mouth 3 (three) times daily. 90 capsule 0   HYDROcodone-acetaminophen (HYCET) 7.5-325 mg/15 ml solution Take 10 mLs by mouth every 6 (six) hours as needed for moderate pain. 473 mL 0   megestrol (MEGACE) 20 MG tablet Take 1 tablet by mouth 2 times daily. 60 tablet 0   morphine (MS CONTIN) 30 MG 12 hr tablet Take 1 tablet by mouth every 8  hours. 90 tablet 0   pantoprazole (PROTONIX) 40 MG tablet Take 1 tablet (40 mg total) by mouth 2 (two) times daily before a meal. 60 tablet 3   prochlorperazine (COMPAZINE) 10 MG tablet Take 1 tablet by mouth every 6 hours as needed for nausea or vomiting. 60 tablet 2   sucralfate (CARAFATE) 1 g tablet Take 1 tablet by mouth 4 times daily. (Dissolve in 15 mls of water before taking) 120 tablet 2   No current facility-administered medications for this visit.  Facility-Administered Medications Ordered in Other Visits  Medication Dose Route Frequency Provider Last Rate Last Admin   diphenhydrAMINE (BENADRYL) 50 MG/ML injection            diphenhydrAMINE (BENADRYL) 50 MG/ML injection            famotidine (PEPCID) 20 mg IVPB            famotidine (PEPCID) 20 mg IVPB            palonosetron (ALOXI) 0.25 MG/5ML injection            palonosetron (ALOXI) 0.25 MG/5ML injection             VITAL SIGNS: There were no vitals taken for this visit. There were no vitals filed for this visit.  Estimated body mass index is 24.23 kg/m as calculated from the following:   Height as of an earlier encounter on 01/25/21: '5\' 7"'  (1.702 m).    Weight as of an earlier encounter on 01/25/21: 154 lb 11.2 oz (70.2 kg).  LABS: CBC:    Component Value Date/Time   WBC 5.7 01/25/2021 0950   HGB 9.3 (L) 01/25/2021 0950   HGB 12.7 (L) 08/17/2020 1339   HCT 28.2 (L) 01/25/2021 0950   PLT 233 01/25/2021 0950   PLT 144 (L) 08/17/2020 1339   MCV 97.2 01/25/2021 0950   NEUTROABS 3.8 01/25/2021 0950   LYMPHSABS 0.6 (L) 01/25/2021 0950   MONOABS 1.1 (H) 01/25/2021 0950   EOSABS 0.1 01/25/2021 0950   BASOSABS 0.0 01/25/2021 0950   Comprehensive Metabolic Panel:    Component Value Date/Time   NA 139 01/04/2021 0954   K 3.5 01/04/2021 0954   CL 105 01/04/2021 0954   CO2 22 01/04/2021 0954   BUN <4 (L) 01/04/2021 0954   CREATININE 0.63 01/04/2021 0954   CREATININE 0.37 (L) 11/09/2020 1033   GLUCOSE 120 (H) 01/04/2021 0954   CALCIUM 8.5 (L) 01/04/2021 0954   AST 20 01/04/2021 0954   AST 32 11/09/2020 1033   ALT 8 01/04/2021 0954   ALT 15 11/09/2020 1033   ALKPHOS 80 01/04/2021 0954   BILITOT 0.4 01/04/2021 0954   BILITOT 0.4 11/09/2020 1033   PROT 6.7 01/04/2021 0954   ALBUMIN 3.1 (L) 01/04/2021 0954     PERFORMANCE STATUS (ECOG) : 1 - Symptomatic but completely ambulatory   Physical Exam General: NAD Cardiovascular: regular rate and rhythm Pulmonary: clear ant fields Abdomen: soft, nontender, + bowel sounds Extremities: left lower extremity edema  Skin: no rashes Neurological: AAO x3, fluent w/interpretor (Spanish)  IMPRESSION:  [name] is here today for follow-up symptom management. Also had follow-up visit with Dr. Burr Medico and is scheduled for cycle 4 treatment today. His sister is present along with Jimmy Hawkins.   Jimmy Hawkins states he is tolerating his treatments well. No acute distress. He is standing, states if he sits for long periods he becomes uncomfortable due to pressure feeling. Is ambulatory with walker.   Cancer related pain, Sacral, back, bilateral lower extremity weakness, bone mets:  Jimmy Hawkins  feels as though his pain is well control on current regimen. Is tolerating gabapentin with recommendations as discussed with Dr. Burr Medico to increase to 600 mg at night. Confirms he is taking hydrocodone and MS Contin as prescribed. Reports pain seems worse later in the day or if he stays in a sitting position for to long. He shares plans to go out and do some shopping with his sister this weekend as he has not done  this in awhile. States he has his days and nights mixed up (jokes saying like a baby). He sleeps majority of the day and then is up all through the night. He and his family are working to reverse this which is why they plan to spend the entire day this weekend as active as he can tolerate.  He would like to continue on current regimen and will let us know if he feels adjustments need to be made. He is taking gabapentin 320m TID.   Weight loss/decreased appetite/dysphagia:  States he is tolerating megace that was ordered on 01/04/2021. Dr. FBurr Medicohas refilled this on today. He feels his swallowing has improved. His sister shares they have noticed he will ask for different foods more since medication change although he may not eat in large quantities this is an improvement to have the desire.   Constipation: reports this is improved. He is taking Miralax as previously recommended. States he is only having to take it once day with good results.   I discussed the importance of continued conversation with family and their medical providers regarding overall plan of care and treatment options, ensuring decisions are within the context of the patients values and GOCs.  PLAN: Continue current pain regimen  Continue Megace I will plan to see patient back in 3-4 weeks in collaboration with future oncology appointments.    Patient expressed understanding and was in agreement with this plan. He also understands that He can call the clinic at any time with any questions, concerns, or complaints.    Time  Total: 20 min   Visit consisted of counseling and education dealing with the complex and emotionally intense issues of symptom management and palliative care in the setting of serious and potentially life-threatening illness.Greater than 50%  of this time was spent counseling and coordinating care related to the above assessment and plan.  Signed by: NAlda Lea AGPCNP-BC Palliative Medicine Team

## 2021-01-25 NOTE — Patient Instructions (Signed)
[facility] ONCOLOGY  Discharge Instructions: Thank you for choosing [facility] to provide your oncology and hematology care.   If you have a lab appointment with the [facility], please go directly to the [facility] and check in at the registration area.   Wear comfortable clothing and clothing appropriate for easy access to any Portacath or PICC line.   We strive to give you quality time with your provider. You may need to reschedule your appointment if you arrive late (15 or more minutes).  Arriving late affects you and other patients whose appointments are after yours.  Also, if you miss three or more appointments without notifying the office, you may be dismissed from the clinic at the providers discretion.      For prescription refill requests, have your pharmacy contact our office and allow 72 hours for refills to be completed.    Today you received the following chemotherapy and/or immunotherapy agents Docetaxol      To help prevent nausea and vomiting after your treatment, we encourage you to take your nausea medication as directed.  BELOW ARE SYMPTOMS THAT SHOULD BE REPORTED IMMEDIATELY: *FEVER GREATER THAN 100.4 F (38 C) OR HIGHER *CHILLS OR SWEATING *NAUSEA AND VOMITING THAT IS NOT CONTROLLED WITH YOUR NAUSEA MEDICATION *UNUSUAL SHORTNESS OF BREATH *UNUSUAL BRUISING OR BLEEDING *URINARY PROBLEMS (pain or burning when urinating, or frequent urination) *BOWEL PROBLEMS (unusual diarrhea, constipation, pain near the anus) TENDERNESS IN MOUTH AND THROAT WITH OR WITHOUT PRESENCE OF ULCERS (sore throat, sores in mouth, or a toothache) UNUSUAL RASH, SWELLING OR PAIN  UNUSUAL VAGINAL DISCHARGE OR ITCHING   Items with * indicate a potential emergency and should be followed up as soon as possible or go to the Emergency Department if any problems should occur.  Please show the CHEMOTHERAPY ALERT CARD or IMMUNOTHERAPY ALERT CARD at check-in to  the Emergency Department and triage nurse.  Should you have questions after your visit or need to cancel or reschedule your appointment, please contact [facility]  Dept: [phone]  and follow the prompts.  Office hours are 8:00 a.m. to 4:30 p.m. Monday - Friday. Please note that voicemails left after 4:00 p.m. may not be returned until the following business day.  We are closed weekends and major holidays. You have access to a nurse at all times for urgent questions. Please call the main number to the clinic Dept: [phone] and follow the prompts.   For any non-urgent questions, you may also contact your provider using MyChart. We now offer e-Visits for anyone 68 and older to request care online for non-urgent symptoms. For details visit [email or web address].   Also download the MyChart app! Go to the app store, search "MyChart", open the app, select [facility], and log in with your MyChart username and password.  Due to Covid, a mask is required upon entering the hospital/clinic. If you do not have a mask, one will be given to you upon arrival. For doctor visits, patients may have 1 support person aged 72 or older with them. For treatment visits, patients cannot have anyone with them due to current Covid guidelines and our immunocompromised population.

## 2021-01-25 NOTE — Progress Notes (Signed)
Left lower extremity venous duplex has been completed. Preliminary results can be found in CV Proc through chart review.  Results were given to [facility] at Dr. Ernestina Penna office.  01/25/21 1:59 PM Jimmy Hawkins RVT

## 2021-01-28 ENCOUNTER — Other Ambulatory Visit (HOSPITAL_COMMUNITY): Payer: Self-pay

## 2021-01-29 ENCOUNTER — Telehealth: Payer: Self-pay | Admitting: Hematology

## 2021-01-29 ENCOUNTER — Inpatient Hospital Stay: Payer: Self-pay | Attending: Hematology

## 2021-01-29 ENCOUNTER — Other Ambulatory Visit: Payer: Self-pay

## 2021-01-29 VITALS — BP 108/80 | HR 119 | Temp 99.0°F | Resp 20

## 2021-01-29 DIAGNOSIS — C154 Malignant neoplasm of middle third of esophagus: Secondary | ICD-10-CM | POA: Insufficient documentation

## 2021-01-29 DIAGNOSIS — R1310 Dysphagia, unspecified: Secondary | ICD-10-CM | POA: Insufficient documentation

## 2021-01-29 DIAGNOSIS — Z5189 Encounter for other specified aftercare: Secondary | ICD-10-CM | POA: Insufficient documentation

## 2021-01-29 DIAGNOSIS — R634 Abnormal weight loss: Secondary | ICD-10-CM | POA: Insufficient documentation

## 2021-01-29 DIAGNOSIS — R531 Weakness: Secondary | ICD-10-CM | POA: Insufficient documentation

## 2021-01-29 DIAGNOSIS — Z79899 Other long term (current) drug therapy: Secondary | ICD-10-CM | POA: Insufficient documentation

## 2021-01-29 DIAGNOSIS — Z923 Personal history of irradiation: Secondary | ICD-10-CM | POA: Insufficient documentation

## 2021-01-29 DIAGNOSIS — F1020 Alcohol dependence, uncomplicated: Secondary | ICD-10-CM | POA: Insufficient documentation

## 2021-01-29 DIAGNOSIS — G893 Neoplasm related pain (acute) (chronic): Secondary | ICD-10-CM | POA: Insufficient documentation

## 2021-01-29 DIAGNOSIS — C7951 Secondary malignant neoplasm of bone: Secondary | ICD-10-CM | POA: Insufficient documentation

## 2021-01-29 MED ORDER — PEGFILGRASTIM-CBQV 6 MG/0.6ML ~~LOC~~ SOSY
6.0000 mg | PREFILLED_SYRINGE | Freq: Once | SUBCUTANEOUS | Status: AC
  Administered 2021-01-29: 6 mg via SUBCUTANEOUS
  Filled 2021-01-29: qty 0.6

## 2021-01-29 NOTE — Progress Notes (Signed)
..  Patient is receiving Assistance Medication - Supplied Externally. Medication: Udenyca ([id number] Manufacture: [employer] Approval Dates: Approved from 01/29/2021 until 01/29/2022. ID: [id number] Reason: Self Pay First DOS: 01/29/2021.  *Re-enrollment for 2023*  .Marland KitchenJuan Quam, CPhT IV Drug Replacement Specialist [place] Phone: [phone]

## 2021-01-29 NOTE — Telephone Encounter (Signed)
Scheduled follow-up appointments per 12/30 los. Patient's niece is aware.

## 2021-01-31 ENCOUNTER — Ambulatory Visit (INDEPENDENT_AMBULATORY_CARE_PROVIDER_SITE_OTHER): Payer: Self-pay | Admitting: Internal Medicine

## 2021-01-31 ENCOUNTER — Encounter: Payer: Self-pay | Admitting: Internal Medicine

## 2021-01-31 ENCOUNTER — Other Ambulatory Visit: Payer: Self-pay

## 2021-01-31 DIAGNOSIS — F1010 Alcohol abuse, uncomplicated: Secondary | ICD-10-CM

## 2021-01-31 DIAGNOSIS — C154 Malignant neoplasm of middle third of esophagus: Secondary | ICD-10-CM

## 2021-01-31 NOTE — Patient Instructions (Addendum)
To [name],   It was a pleasure seeing you today! Today we discussed your medication management. Please fill out the packet for the orange card and mail it in as quickly as possible. We will have you follow up in 6 week's time to reevaluate you.  Have a good day,  Jimmy Mercury, MD  Jimmy Hawkins,  [place] un placer verte hoy! Hoy discutimos su manejo de medicamentos. Complete el paquete para la tarjeta naranja y envelo por correo lo ms rpido posible. Le haremos un seguimiento dentro de 6 semanas para reevaluarlo. Que tengas un buen da, Jimmy Shy, MD

## 2021-01-31 NOTE — Progress Notes (Signed)
CC: Establish Care in order to assist with medications  HPI:  Mr.Fount Doran Clay is a 46 y.o. person, with a PMH noted below, who presents to the clinic to establish care. To see the management of their acute and chronic conditions, please see the A&P note under the Encounters tab.   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 Negative for diaphoresis and weight loss.  Eyes:  Negative for blurred vision and double vision.  Respiratory:  Negative for cough, hemoptysis, sputum production and shortness of breath.   Cardiovascular:  Negative for chest pain, palpitations, orthopnea, claudication and leg swelling.  Gastrointestinal:  Positive for diarrhea. Negative for abdominal pain, blood in stool, constipation, melena, nausea and vomiting.  Musculoskeletal:  Positive for back pain and joint pain. Negative for falls and neck pain.    Physical Exam:  Vitals:   01/31/21 1432  BP: 102/73  Pulse: (!) 118  Resp: (!) 24  Temp: (!) 101 F (38.3 C)  TempSrc: Oral  SpO2: 99%  Weight: 153 lb 1.6 oz (69.4 kg)  Height: 6' (1.829 m)   Physical Exam Constitutional:      General: He is not in acute distress.    Appearance: He is ill-appearing. He is not toxic-appearing or diaphoretic.     Comments: Chronically ill appearing, able to converse in full sentences.   HENT:     Head: Normocephalic.     Right Ear: External ear normal.     Left Ear: External ear normal.     Nose: Nose normal. No congestion or rhinorrhea.  Eyes:     General:        Right eye: No discharge.        Left eye: No discharge.     Extraocular Movements: Extraocular movements intact.     Conjunctiva/sclera: Conjunctivae  normal.     Pupils: Pupils are equal, round, and reactive to light.  Cardiovascular:     Rate and Rhythm: Regular rhythm. Tachycardia present.     Pulses: Normal pulses.     Heart sounds: Normal heart sounds. No murmur heard.   No friction rub. No gallop.  Pulmonary:     Effort: Pulmonary effort is normal. No respiratory distress.     Breath sounds: Normal breath sounds. No stridor. No wheezing, rhonchi or rales.  Chest:     Chest wall: No tenderness.  Abdominal:     General: Abdomen is flat. Bowel sounds are normal.     Palpations: Abdomen is soft.     Tenderness: There is no abdominal tenderness. There is no right CVA tenderness, left CVA tenderness, guarding or rebound.     Hernia: No hernia is present.  Musculoskeletal:     Cervical back: No tenderness.     Right lower leg: No edema.     Left lower leg: No edema.  Lymphadenopathy:     Cervical: No cervical adenopathy.  Skin:    General: Skin is warm.     Capillary Refill: Capillary refill takes less than 2 seconds.     Coloration: Skin is not jaundiced or pale.     Findings: No bruising, erythema, lesion or rash.  Neurological:     General:  No focal deficit present.     Mental Status: He is alert and oriented to person, place, and time.  Psychiatric:        Mood and Affect: Mood normal.        Behavior: Behavior normal.     Assessment & Plan:   See Encounters Tab for problem based charting.  Patient discussed with Dr. Jimmye Norman

## 2021-02-01 ENCOUNTER — Other Ambulatory Visit (HOSPITAL_COMMUNITY): Payer: Self-pay

## 2021-02-01 ENCOUNTER — Other Ambulatory Visit: Payer: Self-pay | Admitting: Hematology

## 2021-02-01 MED ORDER — HYDROCODONE-ACETAMINOPHEN 7.5-325 MG/15ML PO SOLN
10.0000 mL | Freq: Four times a day (QID) | ORAL | 0 refills | Status: DC | PRN
  Filled 2021-02-01 – 2021-02-05 (×2): qty 946, 24d supply, fill #0

## 2021-02-01 NOTE — Assessment & Plan Note (Addendum)
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 Burr Medico. He is s/p palliative radiation, esophageal dilation (11/22), and is on opioid therapy for cancer related.   Per the patient's significant other in the room, he has been referred to [facility] for financial aid assistance. They additionally state that he has had a fever and diarrhea for several days after his last chemo session, but this is normal and lasts about a week after receiving chemo. We discussed that they will need to fill out the packet that was provided in clinic and mail it in order to help with his medications. He states that they will complete the form today.   A/P:  Patient presents to the clinic today to establish care in order to have assistance with his medications. He does have a fever of 101F and 100.47F today. He states that this is typical post chemotherapy, and last several days after, he additionally has diarrhea during this time as well. We discussed the concern for infections in immunocompromised patients, and the concern that this is can be the sign of an infection. The patient and his significant other understand, but deferred to be seen in the ED at this time,but will consider going for evaluation if his symptoms continue to persist. I completed a detailed physical examination that was reassuring, and on chart review he is receiving G-CSF. He will continue to follow with Dr. Burr Medico. Patient will fill out the orange card packet and mail it today. - Decadron 4 mg BID - Gabapentin 300 mg TID - Hycet 7.5-325 mL Q6H PRN - MS Contin 30 mg BID - Megace 20 mg BID - Patient to complete the [employer] and mail it - Consult CCM for assistance

## 2021-02-01 NOTE — Progress Notes (Signed)
Internal Medicine Clinic Attending ? ?Case discussed with Dr. Winters  At the time of the visit.  We reviewed the resident?s history and exam and pertinent patient test results.  I agree with the assessment, diagnosis, and plan of care documented in the resident?s note.  ?

## 2021-02-01 NOTE — Assessment & Plan Note (Signed)
He has decreased his alcohol intake to 2 tall boys per day from 5, discussed the importance of alcohol cessation or decreased intake. Patient voiced understanding.

## 2021-02-05 ENCOUNTER — Encounter: Payer: Self-pay | Admitting: Hematology

## 2021-02-05 ENCOUNTER — Encounter: Payer: Self-pay | Admitting: Physician Assistant

## 2021-02-05 ENCOUNTER — Other Ambulatory Visit (HOSPITAL_COMMUNITY): Payer: Self-pay

## 2021-02-08 ENCOUNTER — Other Ambulatory Visit (HOSPITAL_COMMUNITY): Payer: Self-pay

## 2021-02-08 ENCOUNTER — Other Ambulatory Visit: Payer: Self-pay | Admitting: Hematology

## 2021-02-08 DIAGNOSIS — C154 Malignant neoplasm of middle third of esophagus: Secondary | ICD-10-CM

## 2021-02-08 MED ORDER — MORPHINE SULFATE ER 30 MG PO TBCR
30.0000 mg | EXTENDED_RELEASE_TABLET | Freq: Three times a day (TID) | ORAL | 0 refills | Status: DC
  Filled 2021-02-08 – 2021-02-11 (×3): qty 90, 30d supply, fill #0

## 2021-02-09 ENCOUNTER — Other Ambulatory Visit (HOSPITAL_COMMUNITY): Payer: Self-pay

## 2021-02-11 ENCOUNTER — Encounter: Payer: Self-pay | Admitting: Hematology

## 2021-02-11 ENCOUNTER — Other Ambulatory Visit (HOSPITAL_COMMUNITY): Payer: Self-pay

## 2021-02-11 ENCOUNTER — Encounter: Payer: Self-pay | Admitting: Physician Assistant

## 2021-02-11 MED ORDER — SENNOSIDES-DOCUSATE SODIUM 8.6-50 MG PO TABS: ORAL_TABLET | ORAL | 1 refills | Status: AC

## 2021-02-11 MED ORDER — DEXAMETHASONE 4 MG PO TABS
ORAL_TABLET | ORAL | 1 refills | Status: DC
Start: 2021-02-10 — End: 2021-03-08
  Filled 2021-02-11: qty 30, 30d supply, fill #0

## 2021-02-12 ENCOUNTER — Other Ambulatory Visit: Payer: Self-pay

## 2021-02-12 ENCOUNTER — Ambulatory Visit (HOSPITAL_COMMUNITY)
Admission: RE | Admit: 2021-02-12 | Discharge: 2021-02-12 | Disposition: A | Discharge status: Home / Self Care | Payer: Self-pay | Source: Ambulatory Visit | Attending: Hematology | Admitting: Hematology

## 2021-02-12 DIAGNOSIS — C154 Malignant neoplasm of middle third of esophagus: Secondary | ICD-10-CM | POA: Insufficient documentation

## 2021-02-12 DIAGNOSIS — C7951 Secondary malignant neoplasm of bone: Secondary | ICD-10-CM | POA: Insufficient documentation

## 2021-02-12 LAB — GLUCOSE, CAPILLARY: Glucose-Capillary: 103 mg/dL — ABNORMAL HIGH (ref 70–99)

## 2021-02-12 MED ORDER — FLUDEOXYGLUCOSE F - 18 (FDG) INJECTION
7.5000 | Freq: Once | INTRAVENOUS | Status: AC | PRN
  Administered 2021-02-12: 8.15 via INTRAVENOUS

## 2021-02-14 MED FILL — Dexamethasone Sodium Phosphate Inj 100 MG/10ML: INTRAMUSCULAR | Qty: 1 | Status: AC

## 2021-02-15 ENCOUNTER — Inpatient Hospital Stay: Payer: Self-pay

## 2021-02-15 ENCOUNTER — Other Ambulatory Visit (HOSPITAL_COMMUNITY): Payer: Self-pay

## 2021-02-15 ENCOUNTER — Encounter: Payer: Self-pay | Admitting: Physician Assistant

## 2021-02-15 ENCOUNTER — Inpatient Hospital Stay (HOSPITAL_BASED_OUTPATIENT_CLINIC_OR_DEPARTMENT_OTHER): Payer: Self-pay | Admitting: Hematology

## 2021-02-15 ENCOUNTER — Encounter: Payer: Self-pay | Admitting: Hematology

## 2021-02-15 ENCOUNTER — Other Ambulatory Visit: Payer: Self-pay

## 2021-02-15 VITALS — BP 101/69 | HR 79 | Temp 98.9°F | Resp 16 | Ht 72.0 in | Wt 151.0 lb

## 2021-02-15 DIAGNOSIS — C154 Malignant neoplasm of middle third of esophagus: Secondary | ICD-10-CM

## 2021-02-15 DIAGNOSIS — C7951 Secondary malignant neoplasm of bone: Secondary | ICD-10-CM

## 2021-02-15 DIAGNOSIS — Z95828 Presence of other vascular implants and grafts: Secondary | ICD-10-CM

## 2021-02-15 LAB — CBC WITH DIFFERENTIAL/PLATELET
Abs Immature Granulocytes: 0.03 10*3/uL (ref 0.00–0.07)
Basophils Absolute: 0 10*3/uL (ref 0.0–0.1)
Basophils Relative: 0 %
Eosinophils Absolute: 0 10*3/uL (ref 0.0–0.5)
Eosinophils Relative: 0 %
HCT: 27.7 % — ABNORMAL LOW (ref 39.0–52.0)
Hemoglobin: 9.1 g/dL — ABNORMAL LOW (ref 13.0–17.0)
Immature Granulocytes: 1 %
Lymphocytes Relative: 6 %
Lymphs Abs: 0.3 10*3/uL — ABNORMAL LOW (ref 0.7–4.0)
MCH: 31.8 pg (ref 26.0–34.0)
MCHC: 32.9 g/dL (ref 30.0–36.0)
MCV: 96.9 fL (ref 80.0–100.0)
Monocytes Absolute: 0.4 10*3/uL (ref 0.1–1.0)
Monocytes Relative: 8 %
Neutro Abs: 4.3 10*3/uL (ref 1.7–7.7)
Neutrophils Relative %: 85 %
Platelets: 232 10*3/uL (ref 150–400)
RBC: 2.86 MIL/uL — ABNORMAL LOW (ref 4.22–5.81)
RDW: 14.9 % (ref 11.5–15.5)
WBC: 5 10*3/uL (ref 4.0–10.5)
nRBC: 0 % (ref 0.0–0.2)

## 2021-02-15 LAB — COMPREHENSIVE METABOLIC PANEL
ALT: 16 U/L (ref 0–44)
AST: 22 U/L (ref 15–41)
Albumin: 3.7 g/dL (ref 3.5–5.0)
Alkaline Phosphatase: 62 U/L (ref 38–126)
Anion gap: 11 (ref 5–15)
BUN: 7 mg/dL (ref 6–20)
CO2: 22 mmol/L (ref 22–32)
Calcium: 9.2 mg/dL (ref 8.9–10.3)
Chloride: 104 mmol/L (ref 98–111)
Creatinine, Ser: 0.49 mg/dL — ABNORMAL LOW (ref 0.61–1.24)
GFR, Estimated: >60 mL/min (ref >60)
Glucose, Bld: 131 mg/dL — ABNORMAL HIGH (ref 70–99)
Potassium: 3.3 mmol/L — ABNORMAL LOW (ref 3.5–5.1)
Sodium: 137 mmol/L (ref 135–145)
Total Bilirubin: 0.3 mg/dL (ref 0.3–1.2)
Total Protein: 7 g/dL (ref 6.5–8.1)

## 2021-02-15 MED ORDER — SODIUM CHLORIDE 0.9% FLUSH
10.0000 mL | INTRAVENOUS | Status: DC | PRN
  Administered 2021-02-15: 10 mL via INTRAVENOUS

## 2021-02-15 MED ORDER — SODIUM CHLORIDE 0.9 % IV SOLN: Freq: Once | INTRAVENOUS | Status: AC

## 2021-02-15 MED ORDER — SODIUM CHLORIDE 0.9% FLUSH: 10.0000 mL | INTRAVENOUS | Status: DC | PRN

## 2021-02-15 MED ORDER — SODIUM CHLORIDE 0.9 % IV SOLN
150.0000 mg/m2 | Freq: Once | INTRAVENOUS | Status: AC
  Administered 2021-02-15: 280 mg via INTRAVENOUS
  Filled 2021-02-15: qty 14

## 2021-02-15 MED ORDER — HYDROMORPHONE HCL 2 MG PO TABS
2.0000 mg | ORAL_TABLET | ORAL | 0 refills | Status: DC | PRN
  Filled 2021-02-15: qty 30, 5d supply, fill #0

## 2021-02-15 MED ORDER — PALONOSETRON HCL INJECTION 0.25 MG/5ML
0.2500 mg | Freq: Once | INTRAVENOUS | Status: AC
  Administered 2021-02-15: 0.25 mg via INTRAVENOUS
  Filled 2021-02-15: qty 5

## 2021-02-15 MED ORDER — MORPHINE SULFATE ER 60 MG PO TBCR
60.0000 mg | EXTENDED_RELEASE_TABLET | Freq: Two times a day (BID) | ORAL | 0 refills | Status: DC
  Filled 2021-02-15: qty 30, 15d supply, fill #0

## 2021-02-15 MED ORDER — SODIUM CHLORIDE 0.9 % IV SOLN
10.0000 mg | Freq: Once | INTRAVENOUS | Status: AC
  Administered 2021-02-15: 10 mg via INTRAVENOUS
  Filled 2021-02-15: qty 10

## 2021-02-15 NOTE — Progress Notes (Signed)
DISCONTINUE OFF PATHWAY REGIMEN - Gastroesophageal   [id number]:Docetaxel 75 mg/m2:   A cycle is every 21 days:     Docetaxel   **Always confirm dose/schedule in your pharmacy ordering system**  REASON: Disease Progression PRIOR TREATMENT: Off Pathway: Docetaxel 75 mg/m2 TREATMENT RESPONSE: Progressive Disease (PD)  START ON PATHWAY REGIMEN - Gastroesophageal     A cycle is every 14 days:     Irinotecan   **Always confirm dose/schedule in your pharmacy ordering system**  Patient Characteristics: Distant Metastases (cM1/pM1) / Locally Recurrent Disease, Squamous Cell, Esophageal & GE Junction, Third Line and Beyond, MSS/pMMR or MSI Unknown, PD-L1 Expression  PositiveCPS ? 10 Histology: Squamous Cell Disease Classification: Esophageal Therapeutic Status: Distant Metastases (No Additional Staging) Line of Therapy: Third Engineer, civil (consulting) Status: MSS/pMMR PD-L1 Expression Status: PD-L1 Expression PositiveCPS ? 10 Intent of Therapy: Non-Curative / Palliative Intent, Discussed with Patient

## 2021-02-15 NOTE — Patient Instructions (Addendum)
[facility]  Discharge Instructions: Thank you for choosing [facility] to provide your oncology and hematology care.   If you have a lab appointment with the [facility], please go directly to the [facility] and check in at the registration area.   Wear comfortable clothing and clothing appropriate for easy access to any Portacath or PICC line.   We strive to give you quality time with your provider. You may need to reschedule your appointment if you arrive late (15 or more minutes).  Arriving late affects you and other patients whose appointments are after yours.  Also, if you miss three or more appointments without notifying the office, you may be dismissed from the clinic at the providers discretion.      For prescription refill requests, have your pharmacy contact our office and allow 72 hours for refills to be completed.    Today you received the following chemotherapy and/or immunotherapy agent: Irinotecan     To help prevent nausea and vomiting after your treatment, we encourage you to take your nausea medication as directed.  BELOW ARE SYMPTOMS THAT SHOULD BE REPORTED IMMEDIATELY: *FEVER GREATER THAN 100.4 F (38 C) OR HIGHER *CHILLS OR SWEATING *NAUSEA AND VOMITING THAT IS NOT CONTROLLED WITH YOUR NAUSEA MEDICATION *UNUSUAL SHORTNESS OF BREATH *UNUSUAL BRUISING OR BLEEDING *URINARY PROBLEMS (pain or burning when urinating, or frequent urination) *BOWEL PROBLEMS (unusual diarrhea, constipation, pain near the anus) TENDERNESS IN MOUTH AND THROAT WITH OR WITHOUT PRESENCE OF ULCERS (sore throat, sores in mouth, or a toothache) UNUSUAL RASH, SWELLING OR PAIN  UNUSUAL VAGINAL DISCHARGE OR ITCHING   Items with * indicate a potential emergency and should be followed up as soon as possible or go to the Emergency Department if any problems should occur.  Please show the CHEMOTHERAPY ALERT CARD or IMMUNOTHERAPY ALERT CARD at check-in to  the Emergency Department and triage nurse.  Should you have questions after your visit or need to cancel or reschedule your appointment, please contact [facility]  Dept: [phone]  and follow the prompts.  Office hours are 8:00 a.m. to 4:30 p.m. Monday - Friday. Please note that voicemails left after 4:00 p.m. may not be returned until the following business day.  We are closed weekends and major holidays. You have access to a nurse at all times for urgent questions. Please call the main number to the clinic Dept: [phone] and follow the prompts.   For any non-urgent questions, you may also contact your provider using MyChart. We now offer e-Visits for anyone 39 and older to request care online for non-urgent symptoms. For details visit [email or web address].   Also download the MyChart app! Go to the app store, search "MyChart", open the app, select [facility], and log in with your MyChart username and password.  Due to Covid, a mask is required upon entering the hospital/clinic. If you do not have a mask, one will be given to you upon arrival. For doctor visits, patients may have 1 support person aged 22 or older with them. For treatment visits, patients cannot have anyone with them due to current Covid guidelines and our immunocompromised population.   Irinotecan injection Qu es este medicamento? El IRINOTECN es un agente quimioteraputico. Se utiliza en el tratamiento del cncer de colon y rectal. Este medicamento puede ser utilizado para otros usos; si tiene alguna pregunta consulte con su proveedor de atencin mdica o con su farmacutico. MARCAS COMUNES: Camptosar Qu  le debo informar a mi profesional de la salud antes de tomar este medicamento? Necesitan saber si usted presenta alguno de los siguientes problemas o situaciones: deshidratacin diarrea infeccin (especialmente infecciones virales, como varicela, fuegos labiales o  herpes) enfermedad heptica recuentos sanguneos bajos, como baja cantidad de glbulos blancos, plaquetas o glbulos rojos niveles bajos de calcio, magnesio o potasio en la sangre terapia de radiacin en curso o reciente una reaccin alrgica o inusual al irinotecn, a otros medicamentos, alimentos, colorantes o conservantes si est embarazada o buscando quedar embarazada si est amamantando a un beb Cmo debo utilizar este medicamento? Este medicamento se administra como infusin en una vena. Un profesional de la salud especialmente capacitado lo administra en un hospital o clnica. Hable con su pediatra para informarse acerca del uso de este medicamento en nios. Puede requerir atencin especial. Sobredosis: Pngase en contacto inmediatamente con un centro toxicolgico o una sala de urgencia si usted cree que haya tomado demasiado medicamento. ATENCIN: [employer] es solo para usted. No comparta este medicamento con nadie. Qu sucede si me olvido de una dosis? Es importante no olvidar ninguna dosis. Informe a su mdico o a su profesional de la salud si no puede asistir a Photographer. Qu puede interactuar con este medicamento? No use este medicamento con ninguno de los siguientes frmacos: cobicistat itraconazol Este medicamento podra interactuar con los siguientes frmacos: medicamentos antivirales para el VIH o SIDA ciertos antibiticos, tales como rifampicina o rifabutina ciertos medicamentos para infecciones micticas, tales como ketoconazol, posaconazol y voriconazol ciertos medicamentos para convulsiones, tales como [place], fenobarbital y fenitona claritromicina gemfibrozil nefazodona hierba de [place] Puede ser que esta lista no menciona todas las posibles interacciones. Informe a su profesional de [employer]	[place] de [employer] productos a base de hierbas, medicamentos de [place] o suplementos nutritivos que est tomando. Si usted fuma, consume bebidas alcohlicas o  si utiliza drogas ilegales, indqueselo tambin a su profesional de [employer]	[place]. Algunas sustancias pueden interactuar con su medicamento. A qu debo estar atento al usar Coca[employer]? Se supervisar su estado de salud atentamente mientras reciba este medicamento. Tendr que hacerse anlisis de sangre importantes mientras est usando este medicamento. Este medicamento podra hacerle sentir un Nurse, mental health. Esto es normal, ya que la quimioterapia puede afectar tanto a las clulas sanas como a las clulas cancerosas. Si presenta algn efecto secundario, infrmelo. Contine con el tratamiento aun si se siente enfermo, a menos que su mdico le indique que lo suspenda. En algunos casos, podra recibir [employer]ands para ayudarlo con los efectos secundarios. Siga todas las instrucciones para usarlos. Puede experimentar somnolencia o mareos. No conduzca, no utilice maquinaria ni haga nada que Associate Professor en estado de alerta hasta que sepa cmo le afecta este medicamento. No se siente ni se ponga de pie con rapidez, especialmente si es un paciente de edad avanzada. Esto reduce el riesgo de mareos o [employer]. Consulte a su profesional de la salud si tiene fiebre, escalofros, dolor de garganta o cualquier otro sntoma de resfro o gripe. No se trate usted mismo. Este medicamento reduce la capacidad del cuerpo para combatir infecciones. Trate de no acercarse a personas que estn enfermas. Evite usar productos que contienen aspirina, acetaminofeno, ibuprofeno, naproxeno o ketoprofeno, a menos que as lo indique su mdico. Estos productos pueden ocultar la fiebre. Este medicamento podra aumentar el riesgo de moretones o sangrado. Consulte a su mdico o a su profesional de la salud si observa sangrados inusuales. Proceda con cuidado al  cepillar sus dientes, usar hilo dental o utilizar palillos para los dientes, ya que podra contraer una infeccin o Therapist, art con mayor facilidad. Si se somete a  algn tratamiento dental, informe a su dentista que est [employer]. No debe quedar embarazada mientras est usando este medicamento o por 6 meses despus de dejar de usarlo. Las mujeres deben informar a su profesional de la salud si estn buscando quedar embarazadas o si creen que podran estar embarazadas. Los hombres no deben Social worker a Social research officer, government estn recibiendo Coca[employer]Cola y [place] 3 meses despus de dejar de usarlo. Existe la posibilidad de que ocurran efectos secundarios graves en un beb sin nacer. Para obtener ms informacin, hable con su profesional de [employer]	[place]. No debe amamantar a un beb mientras est tomando Coca-Cola o por 7 das despus de dejar de usarlo. Este medicamento ha causado insuficiencia ovrica en [employer]. Este medicamento puede causar dificultades para [place]. Hable con su profesional de la salud si le preocupa su fertilidad. Este medicamento ha causado recuentos de esperma reducidos en algunos hombres. Esto puede hacer ms difcil que un hombre embarace a Musician. Hable con su profesional de la salud si le preocupa su fertilidad. Qu efectos secundarios puedo tener al [employer] este medicamento? Efectos secundarios que debe informar a su mdico o a Barrister's clerk de la salud tan pronto como sea posible: Chief of Staff, tales como erupcin cutnea, comezn/picazn o urticaria, e hinchazn de la cara, los labios o la Holiday representative en el pecho diarrea enrojecimiento, goteo nasal, sudoracin durante la infusin recuentos sanguneos bajos: este medicamento podra reducir la cantidad de glbulos blancos, glbulos rojos y plaquetas. Su riesgo de infeccin y sangrado podra ser mayor. nuseas, vmito dolor, hinchazn, calor en la pierna signos de disminucin en la cantidad de plaquetas o sangrado: moretones, puntos rojos en la piel, heces de color negro y aspecto alquitranado, sangre en la orina signos de infeccin:  fiebre o escalofros, tos, dolor de Investment banker, operational, Social research officer, government o dificultad para orinar signos de disminucin en la cantidad de glbulos rojos: debilidad o cansancio inusuales, desmayos, aturdimiento Efectos secundarios que generalmente no requieren Geophysical data processor (infrmelos a su mdico o a Barrister's clerk de la salud si persisten o si son molestos): estreimiento cada del cabello dolor de cabeza prdida del apetito llagas en la boca dolor estomacal Puede ser que esta lista no menciona todos los posibles efectos secundarios. Comunquese a su mdico por asesoramiento mdico [employer]. Usted puede informar los efectos secundarios a la FDA por telfono al 1-800-FDA-1088. Dnde debo guardar mi medicina? Este medicamento se administra en hospitales o clnicas, y no necesitar guardarlo en su domicilio. ATENCIN: Este folleto es un resumen. Puede ser que no cubra toda la posible informacin. Si usted tiene preguntas acerca de esta medicina, consulte con su mdico, su farmacutico o su profesional de Technical sales engineer.  2022 Elsevier/Gold Standard (2019-05-19 00:00:00)

## 2021-02-15 NOTE — Progress Notes (Signed)
[place]   [phone] Fax:(336) [phone]   Clinic Follow up Note   Patient Care Team: [name], MD as PCP - General (Hematology) [name], MD as Consulting Physician (Oncology) Pickenpack-Cousar, Carlena Sax, NP as Nurse Practitioner (Nurse Practitioner)  Date of Service:  02/15/2021  CHIEF COMPLAINT: f/u of metastatic esophageal cancer  CURRENT THERAPY:  Irinotecan, starting 02/15/21  ASSESSMENT & PLAN:  Jimmy Hawkins is a 46 y.o. male with   1. 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 08/11/19 CT CAP found him to have mid esophageal mass and enlarged AP window node, no distant metastasis. -He completed 6 weeks of concurrent chemoRT with weekly Carboplatin and Taxol 08/29/19-10/06/19 -Repeat PET scan 10/2019 showed hypermetabolic wall thickening of the distal half of the thoracic esophageus, no notable distant metastasis.  -He received CAPOX q3weeks with Xeloda 1564m BID 2 weeks on/1 week off 12/20/19-02/2020. -He completed palliative Radiation to sacrum with Dr MLisbeth Renshaw2/28/22-3/15/22.  -He started First-line single agent Keytruda 04/27/20. Due to his slow improvement and squamous cell histology, we added chemo with carboplatin and Taxol 2 weeks on/1 week off from C4 on 06/29/20.  -restaging PET 11/08/20 showed worsening diease to the left sacral lesion and mild increase in activity to esophageal mass. No new metastasis. -he had port placed on 12/07/20 -he switched to docetaxel on 11/23/20. He has been tolerating moderately well overall, his pain is stable. -restaging PET on 02/12/21 showed: increase in activity to and size of left hemisacrum lesion; similar mild metabolic activity to primary esophageal mass; skeletal findings favored to reflect marrow stimulation. I reviewed with them today, and discussed  this is why his pain is worsening. -I discussed changing treatment and reviewed his treatment options, which are becoming very limited. I also discussed the option of hospice home health care given the low benefit from chemo at this stage. After a lengthy discussion, pt would like to try more chemo. I will change treatment to irinotecan every 2 weeks.  Potential benefit and side effects discussed with him, especially diarrhea, he is agreeable to proceed today. -lab reviewed, adequate for treatment, will proceed with irinotecan today and continue every 2 weeks   2. Sacral/back pain, LE weakness, secondary to bone metastasis -He was seen to have bone mets on 04/11/20 PET scan. -He completed palliative Radiation to sacrum with Dr MLisbeth Renshaw2/28/22-3/15/22. He was given short course dexa 437m  -he has had worsening pain lately. PET on 02/12/21 showed worsening to left hemisacrum lesion, causing his pain. -I will increase his MS Contin dose and switch his hydrocodone to dilaudid.   3. Goal of care discussion, DNR  -We previously discussed the incurable nature of his cancer, and the overall poor prognosis, especially if he does not have good response to chemotherapy or progress on chemo. At this point, I feel the rate of response is only 20-30%. -The patient understands the goal of care is palliative. -I recommend DNR/DNI, he agreed 11/09/20   4. H/o Heavy alcohol user -He quit drinking since cancer diagnosis, but started again. Due to weakness, he is not able to drink as much since 04/13/20 and stopped overall 06/29/20 -on 10/12/20, he reported he is back to drinking 5 beers a day. I previously advised him to abstain for the day of and a few following chemo.   5. 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 He has already applied for Medicaid. -He has met with financial advocate Shauna and SW and he has grant assistance with medications.     PLAN: -proceed  with irinotecan today and continue every 2 weeks             -udenyca on day 3 (Monday) -I refilled his MS contin and increased the dose to 67m q12h and called in dilaudid to replace Hycocet  -He will follow-up with palliative care NP Nikki on Monday   No problem-specific Assessment & Plan notes found for this encounter.   SUMMARY OF ONCOLOGIC HISTORY: Oncology History Overview Note  Cancer Staging Malignant ne[place] of middle third of esophagus ([place] Staging form: Esophagus - Squamous Cell Carcinoma, AJCC 8th Edition - Clinical: Stage Unknown (cTX, cN1, cM0) - Signed by FTruitt Merle MD on 08/18/2019    Malignant neoplasm of middle third of esophagus ([facility]   Initial Diagnosis   Malignant neoplasm of middle third of esophagus ([place]   08/11/2019 Imaging   CT CAP W contrast 08/11/19 IMPRESSION: 1. Mid to lower esophageal mass along an 8 cm segment, large endoluminal component, strongly favoring esophageal malignancy. Borderline enlarged AP window lymph node. No findings of distant metastatic disease. 2. Trace bilateral pleural effusions. 3. Diffuse hepatic steatosis. 4. Cholelithiasis. 5. Fatty spermatic cords likely due to small bilateral indirect inguinal hernias.   08/11/2019 Procedure   EGD by Dr GCarlean Purl IMPRESSION - Partially obstructing, likely malignant esophageal tumor was found in the upper third of the esophagus and in the middle third of the esophagus. Biopsied. Very large and long - difficult but able to advance scope by this lesion - await CT for better length estimate - Normal stomach. - Normal examined duodenum.   08/11/2019 Initial Biopsy   FINAL MICROSCOPIC DIAGNOSIS:   A. ESOPHAGUS, UPPER, BIOPSY:  - Squamous cell carcinoma.    08/11/2019 Genetic Testing   PD-L1 Expression 90%   08/18/2019 Cancer Staging   Staging form: Esophagus - Squamous Cell Carcinoma, AJCC 8th Edition - Clinical: Stage Unknown (cTX, cN1, cM0) - Signed by FTruitt Merle MD on 08/18/2019     08/29/2019 - 10/04/2019 Chemotherapy   Concurrent chemoradiation with weekly CT for 6 weeks starting 08/29/19-10/04/19    08/29/2019 - 10/06/2019 Radiation Therapy   Concurrent chemoradiation with Dr MLisbeth Renshawstarting 08/29/19-10/06/19   11/11/2019 PET scan   IMPRESSION: 1. Again seen is a long segment of FDG avid wall thickening of the distal half of the thoracic esophagus compatible with known esophageal neoplasm. 2. No signs of FDG avid nodal or distant metastatic disease.     11/28/2019 Procedure   EUS by Dr MRush LandmarkIMPRESSION EGD Impression: - No gross lesions in esophagus proximally. - Previously noted esophageal cancer is not present. However, in its place and extending distal to this is likely LA Grade D chemotherapy/radiation esophagitis with bleeding. Biopsied after the EUS was complete to evaluate for persistent disease or not. - Z-line regular, 37 cm from the incisors. - 3 cm hiatal hernia. - Erythematous mucosa in the stomach. No other gross lesions in the stomach. Biopsied. - No gross lesions in the duodenal bulb, in the first portion of the duodenum and in the second portion of the duodenum. EUS Impression: - Wall thickening was seen in the thoracic esophagus into the gastroesophageal junction. The thickening appeared to primarily be within the luminal interface/superficial mucosa (Layer 1) and deep mucosa (Layer 2). The previously noted esophageal cancer  has been replaced by what appears to be radiation/chemotherapy induced changes. With the limitations of having to use the mini-probe EUS, I could not visualize persistent significant disease other than the entire distal area of the esophagus having likely changes noted as above. - No malignant-appearing lymph nodes were visualized in the middle paraesophageal mediastinum (level 32M), lower paraesophageal mediastinum (level 8L), diaphragmatic region (level 15) and paracardial region (level 16).      FINAL MICROSCOPIC  DIAGNOSIS:   A. STOMACH, BIOPSY:  - Mildly active chronic gastritis.  - Warthin-Starry negative for Helicobacter pylori.  - No intestinal metaplasia, dysplasia or carcinoma.   B. ESOPHAGUS, BIOPSY:  - Inflamed granulation tissue and exudate consistent with ulcer.  - No malignancy identified.    12/20/2019 - 02/2020 Chemotherapy   CAPOX q3weeks with Xeloda 1533m BID 2 weeks on/1 week off starting 12/20/19-02/2020   03/21/2020 [facility]   [facility]Circumferential thickening of the lower third of the esophagus which corresponds with known esophageal malignancy. No evidence of metastatic disease within the chest.  --For findings below the diaphragm, please see concurrent separately reported CT of the abdomen and pelvis.  --Expansile lytic mass with associated soft tissue component centered in the left sacral ala and abutting the left S1 and S2 nerve roots, left internal iliac vasculature, and left pyriformis muscle with no other metastatic disease within the abdomen or pelvis.   --Diffuse thickening of the distal esophagus compatible with known esophageal malignancy.   --Cholelithiasis with nonspecific gallbladder distention. Clinical correlation is recommended. If there is clinical concern for cholecystitis, further evaluation with ultrasound can be obtained.   03/21/2020 Imaging   CT Lumbar Spine  Expansile lytic lesion centered in the left sacral ala with no other evidence of metastatic disease in the lumbar spine.   03/26/2020 - 04/10/2020 Radiation Therapy   Palliative Radiation to Sacrum with Dr MLisbeth Renshaw2/28/22-3/15/22.    04/11/2020 Progression   PET  IMPRESSION: 1. New hypermetabolic expansile 6.1 cm upper left sacral lytic bone metastasis, centrally necrotic. 2. New hypermetabolic left level 1b and left level 2 neck lymph nodes, indeterminate for reactive versus metastatic nodes. 3. Persistent hypermetabolism associated with a short segment of mid to lower thoracic esophagus  with associated circumferential esophageal wall thickening, similar in metabolism and decreased in wall thickness since 11/11/2019 PET-CT, nonspecific, favor post treatment change, with residual neoplasm not excluded. 4. No additional sites of hypermetabolic metastatic disease. 5.  Chronic findings include: Cholelithiasis.   04/27/2020 -  Chemotherapy   First-line with immunotherapy Keytruda q3weeks starting 04/27/20       06/29/2020 -  Chemotherapy   Weekly carbo AUC 1.5 and Taxol 675mm2 on day 1, 8 every 21 days added to first-line Keytruda from C4 on 06/29/20.     07/17/2020 PET scan   IMPRESSION: 1. Left sacral metastasis is slightly enlarged and slightly increased in peripheral hypermetabolism. No additional evidence of metastatic disease. 2. Similar mild hypermetabolism associated with the mid esophagus. No residual hypermetabolism within left neck lymph nodes. 3. Small pericardial effusion. 4. Trace bilateral pleural effusions. 5. Cholelithiasis.     09/14/2020 Procedure   Upper Endoscopy  Impression: - Esophagitis. Biopsied. - Esophageal stenosis. Dilated. Biopsied. this looks most like a mid-distal inflammatory stricture (? post XRT +/- reflux, Candida?) scope passed through but styill felt gerip on scope so did not advance deeper into stomach to avoid trauma. - Normal cardia.    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 - NO EVIDENCE OF DYSPLASIA OR MALIGNANCY. - SEE MICROSCOPIC DESCRIPTION. Microscopic Comment While histologic changes of radiation effects are not apparent in the inflamed granulation tissue, the presence of ulceration can be indicative of radiation effects.   11/08/2020 PET scan   IMPRESSION: 1. Substantial (about 70%) increase in volume of the left sacral metastatic lesion which has central necrosis and peripheral hypermetabolic activity. This lesion now has increased  erosion across the SI joint into the left iliac bone and into the left sacrum and sacral spinal canal. 2. Mild increase in activity in the thickened segment of mid to distal esophagus corresponding to the original mass, current maximum SUV 6.2 (formerly 4.6). 3. Stable small posterior pericardial effusion.   11/23/2020 -  Chemotherapy   Patient is on Treatment Plan : LUNG Docetaxel q21d     02/12/2021 PET scan   IMPRESSION: 1. Increase in size of the peripherally hypermetabolic centrally necrotic metastatic lesion centered in the left hemisacrum.   2. Similar mild metabolic activity associated with the thickened segment of mid to distal esophagus which corresponds with the original mass and is favored to reflect posttreatment change.   3. Extensive hypermetabolic marrow activity involving the axial and proximal appendicular skeleton without new/additional suspicious osseous lesion, favored to reflect marrow stimulation.      INTERVAL HISTORY:  Jimmy Hawkins is here for a follow up of metastatic esophageal cancer. He was last seen by me on 01/25/21. He presents to the clinic accompanied by his niece. He reports things are going okay. He reports worsening pain, not controlled with the medication. On average, he rates it 9-10/10. He notes the pain is mainly in his left hip, and he is in a wheelchair today instead of using a walker. He states his pain has been this bad for about two weeks.   All other systems were reviewed with the patient and are negative.  MEDICAL HISTORY:  Past Medical History:  Diagnosis Date   Blood transfusion without reported diagnosis    Cramps of left lower extremity 2021   Hematemesis with nausea 08/10/2019   Squamous cell carcinoma, esophagus ([place]) 04/2019   metastatic    SURGICAL HISTORY: Past Surgical History:  Procedure Laterality Date   BIOPSY  08/11/2019   Procedure: BIOPSY;  Surgeon: [name], MD;  Location: [facility] ENDOSCOPY;   Service: Endoscopy;;   BIOPSY  11/28/2019   Procedure: BIOPSY;  Surgeon: [name], MD;  Location: [facility] ENDOSCOPY;  Service: Gastroenterology;;   ESOPHAGOGASTRODUODENOSCOPY  08/11/2019   ESOPHAGOGASTRODUODENOSCOPY (EGD) WITH PROPOFOL N/A 08/11/2019   Procedure: ESOPHAGOGASTRODUODENOSCOPY (EGD) WITH PROPOFOL;  Surgeon: [name], MD;  Location: [place];  Service: Endoscopy;  Laterality: N/A;   ESOPHAGOGASTRODUODENOSCOPY (EGD) WITH PROPOFOL N/A 11/28/2019   Procedure: ESOPHAGOGASTRODUODENOSCOPY (EGD) WITH PROPOFOL;  Surgeon: Rush Landmark Telford Nab., MD;  Location: [place];  Service: Gastroenterology;  Laterality: N/A;   EUS N/A 11/28/2019   Procedure: UPPER ENDOSCOPIC ULTRASOUND (EUS) RADIAL;  Surgeon: [name], MD;  Location: [place];  Service: Gastroenterology;  Laterality: N/A;   IR IMAGING GUIDED PORT INSERTION  12/07/2020    I have reviewed the social history and family history with the patient and they are unchanged from previous note.  ALLERGIES:  has No Known Allergies.  MEDICATIONS:  Current Outpatient Medications  Medication Sig Dispense Refill   dexamethasone (DECADRON) 4 MG tablet Take 1 tablet (4 mg total) by mouth 2 (two) times daily. Start the day before Taxotere.  Then daily after chemo for 2 days. 15 tablet 1   dexamethasone (DECADRON) 4 MG tablet Take 1 tablet by mouth once a day WITH FOOD 90 tablet 1   diphenhydrAMINE-APAP, sleep, (TYLENOL PM EXTRA STRENGTH) 50-1000 MG/30ML LIQD Take 15 mLs by mouth at bedtime as needed (sleep).     famotidine (PEPCID) 20 MG tablet TOME 1 PASTILLA POR VIA ORAL DOS VECES AL DIA 60 tablet 3   Ferrous Sulfate (IRON) 325 (65 Fe) MG TABS Take 1 tablet (325 mg total) by mouth 2 (two) times daily. 60 tablet 3   ferrous sulfate 325 (65 FE) MG tablet TAKE 1 TABLET BY MOUTH 2 TIMES DAILY. 60 tablet 3   gabapentin (NEURONTIN) 300 MG capsule Take 1 capsule (300 mg total) by mouth 3 (three) times daily. 90  capsule 0   HYDROcodone-acetaminophen (HYCET) 7.5-325 mg/15 ml solution Take 10 mLs by mouth every 6  hours as needed for moderate pain. OK to refill on 02/05/2021 946 mL 0   megestrol (MEGACE) 20 MG tablet Take 1 tablet by mouth 2 times daily. 60 tablet 0   morphine (MS CONTIN) 30 MG 12 hr tablet Take 1 tablet by mouth every 8  hours. 90 tablet 0   pantoprazole (PROTONIX) 40 MG tablet Take 1 tablet (40 mg total) by mouth 2 (two) times daily before a meal. 60 tablet 3   prochlorperazine (COMPAZINE) 10 MG tablet Take 1 tablet by mouth every 6 hours as needed for nausea or vomiting. 60 tablet 2   senna-docusate (SENOKOT S) 8.6-50 MG tablet Take 2 tablet by mouth once a day 180 tablet 1   sucralfate (CARAFATE) 1 g tablet Take 1 tablet by mouth 4 times daily. (Dissolve in 15 mls of water before taking) 120 tablet 2   No current facility-administered medications for this visit.   Facility-Administered Medications Ordered in Other Visits  Medication Dose Route Frequency Provider Last Rate Last Admin   diphenhydrAMINE (BENADRYL) 50 MG/ML injection            diphenhydrAMINE (BENADRYL) 50 MG/ML injection            famotidine (PEPCID) 20 mg IVPB            famotidine (PEPCID) 20 mg IVPB            palonosetron (ALOXI) 0.25 MG/5ML injection            palonosetron (ALOXI) 0.25 MG/5ML injection             PHYSICAL EXAMINATION: ECOG PERFORMANCE STATUS: 2 - Symptomatic, <50% confined to bed  Vitals:   02/15/21 1016  BP: 101/69  Pulse: 79  Resp: 16  Temp: 98.9 F (37.2 C)  SpO2: 100%   Wt Readings from Last 3 Encounters:  02/15/21 151 lb (68.5 kg)  01/31/21 153 lb 1.6 oz (69.4 kg)  01/25/21 154 lb 11.2 oz (70.2 kg)     GENERAL:alert, no distress and comfortable SKIN: skin color normal, no rashes or significant lesions EYES: normal, Conjunctiva are pink and non-injected, sclera clear  NEURO: alert & oriented x 3 with fluent speech  LABORATORY DATA:  I have reviewed the data as  listed CBC Latest Ref Rng & Units 02/15/2021 01/25/2021 01/04/2021  WBC 4.0 - 10.5 K/uL 5.0 5.7 5.3  Hemoglobin 13.0 - 17.0 g/dL 9.1(L) 9.3(L) 10.0(L)  Hematocrit 39.0 - 52.0 % 27.7(L) 28.2(L) 30.0(L)  Platelets 150 - 400 K/uL 232 233 264     CMP Latest Ref Rng &  Units 02/15/2021 01/25/2021 01/04/2021  Glucose 70 - 99 mg/dL 131(H) 85 120(H)  BUN 6 - 20 mg/dL 7 5(L) <4(L)  Creatinine 0.61 - 1.24 mg/dL 0.49(L) 0.42(L) 0.63  Sodium 135 - 145 mmol/L 137 135 139  Potassium 3.5 - 5.1 mmol/L 3.3(L) 3.7 3.5  Chloride 98 - 111 mmol/L 104 103 105  CO2 22 - 32 mmol/L '22 23 22  ' Calcium 8.9 - 10.3 mg/dL 9.2 9.1 8.5(L)  Total Protein 6.5 - 8.1 g/dL 7.0 7.1 6.7  Total Bilirubin 0.3 - 1.2 mg/dL 0.3 0.4 0.4  Alkaline Phos 38 - 126 U/L 62 64 80  AST 15 - 41 U/L '22 17 20  ' ALT 0 - 44 U/L '16 9 8      ' RADIOGRAPHIC STUDIES: I have personally reviewed the radiological images as listed and agreed with the findings in the report. No results found.    No orders of the defined types were placed in this encounter.  All questions were answered. The patient knows to call the clinic with any problems, questions or concerns. No barriers to learning was detected. The total time spent in the appointment was 40 minutes.     [name], MD 02/15/2021   I, Wilburn Mylar, am acting as scribe for [name], MD.   I have reviewed the above documentation for accuracy and completeness, and I agree with the above.

## 2021-02-16 ENCOUNTER — Encounter: Payer: Self-pay | Admitting: Hematology

## 2021-02-16 ENCOUNTER — Encounter: Payer: Self-pay | Admitting: Physician Assistant

## 2021-02-16 ENCOUNTER — Other Ambulatory Visit (HOSPITAL_COMMUNITY): Payer: Self-pay

## 2021-02-18 ENCOUNTER — Other Ambulatory Visit: Payer: Self-pay

## 2021-02-18 ENCOUNTER — Telehealth: Payer: Self-pay

## 2021-02-18 ENCOUNTER — Other Ambulatory Visit (HOSPITAL_COMMUNITY): Payer: Self-pay

## 2021-02-18 ENCOUNTER — Inpatient Hospital Stay (HOSPITAL_BASED_OUTPATIENT_CLINIC_OR_DEPARTMENT_OTHER): Payer: Self-pay | Admitting: Nurse Practitioner

## 2021-02-18 ENCOUNTER — Encounter: Payer: Self-pay | Admitting: Nurse Practitioner

## 2021-02-18 ENCOUNTER — Inpatient Hospital Stay: Payer: Self-pay

## 2021-02-18 VITALS — BP 91/70 | HR 103

## 2021-02-18 VITALS — BP 93/69 | HR 117 | Temp 98.2°F | Resp 18 | Wt 152.4 lb

## 2021-02-18 DIAGNOSIS — C154 Malignant neoplasm of middle third of esophagus: Secondary | ICD-10-CM

## 2021-02-18 DIAGNOSIS — C7951 Secondary malignant neoplasm of bone: Secondary | ICD-10-CM

## 2021-02-18 DIAGNOSIS — D702 Other drug-induced agranulocytosis: Secondary | ICD-10-CM

## 2021-02-18 DIAGNOSIS — R531 Weakness: Secondary | ICD-10-CM

## 2021-02-18 DIAGNOSIS — D500 Iron deficiency anemia secondary to blood loss (chronic): Secondary | ICD-10-CM

## 2021-02-18 DIAGNOSIS — G893 Neoplasm related pain (acute) (chronic): Secondary | ICD-10-CM

## 2021-02-18 DIAGNOSIS — Z515 Encounter for palliative care: Secondary | ICD-10-CM

## 2021-02-18 MED ORDER — FILGRASTIM-SNDZ 300 MCG/0.5ML IJ SOSY
300.0000 ug | PREFILLED_SYRINGE | Freq: Once | INTRAMUSCULAR | Status: AC
  Administered 2021-02-18: 300 ug via SUBCUTANEOUS
  Filled 2021-02-18: qty 0.5

## 2021-02-18 NOTE — Telephone Encounter (Signed)
-----   Message from Anastasia Pall, RN sent at 02/15/2021  2:14 PM EST ----- Regarding: 1st time Irinotecan follow-up call , being seen by Dr. Burr Medico Patient received 1st irinotecan infusion today. Tolerated treatment well. Being seen by Dr. Burr Medico.

## 2021-02-18 NOTE — Telephone Encounter (Signed)
Niece interpreted for patient's wife. He is doing well after treatment. He felt a little dizzy after the treatment but that is better now.  He is only drinking 16 oz a day. Told the niece he needs at least 80 ox a day.  She said that her Barbaraann Rondo can do that.  Niece states that they have the phone number [phone] if they have any questions or concerns.

## 2021-02-18 NOTE — Patient Instructions (Addendum)
Today we discussed your pain medication and regimen:  Continue to take the MS Contin (long-acting) pain medication every 12 hours.  Take Dilaudid (short-acting) pain medication every 3-4 hours for breakthrough pain. Make sure not to stand or do any activity without supervision if you get drowsy.  Please take gabapentin 2 capsules at bedtime (this is an increase from the 1 capsule). Continue taking 1 capsule in the morning and mid-day.  Increase water intake to at least 64 oz if possible. Contact office if begin to have diarrhea uncontrolled.    Hoy discutimos su medicamento para Conservation officer, historic buildings y su rgimen:  1. Contine tomando el medicamento para Conservation officer, historic buildings MS Contin (de accin prolongada) cada 12 horas. 2. Tome el medicamento para el dolor Dilaudid (de accin corta) cada 3 a 4 horas para el dolor irruptivo. Asegrese de no pararse ni hacer ninguna actividad sin supervisin si se siente somnoliento. 3. Tome 2 cpsulas de gabapentina antes de acostarse (esto es un aumento de 1 cpsula). Contine tomando 1 cpsula por la maana y al [id number]. 4. Aumente la ingesta de agua a por lo menos 64 oz si es posible. Comunquese con la oficina si comienza a Network engineer.

## 2021-02-18 NOTE — Progress Notes (Signed)
[place]  [phone]) [phone] Fax:(336) [phone]   Name: Jimmy Hawkins Date: 02/18/2021 MRN: [id number]  DOB: [birth year]  Patient Care Team: Jimmy Merle, MD as PCP - General (Hematology) Jimmy Merle, MD as Consulting Physician (Oncology) Hawkins, Jimmy Sax, NP as Nurse Practitioner (Nurse Practitioner)    INTERVAL HISTORY: Jimmy Hawkins is a 46 y.o. male with  metastatic esophageal cancer (07/2019) with left sacral lesion, s/p palliative radiation dysphagia s/p esophageal dilation (12/24/2020), and cancer-related sacral/back pain. Palliative ask to see for symptom management and goals of care.   SOCIAL HISTORY:     reports that he has never smoked. He has never used smokeless tobacco. He reports current alcohol use of about 30.0 standard drinks per week. He reports that he does not use drugs.  ADVANCE DIRECTIVES:  Patient reports he does not have a documented advanced directive.  MOST form introduced.  Patient expressed wishes to further discuss at his follow-up visit.  He did express wishes for DNR/DNI.  Out a facility form has been completed and given to patient and his sister.  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  MEDICATIONS:  Current Outpatient Medications  Medication Sig Dispense Refill   dexamethasone (DECADRON) 4 MG tablet Take 1 tablet by mouth once a day WITH FOOD 90 tablet 1   diphenhydrAMINE-APAP, sleep, (TYLENOL PM EXTRA STRENGTH) 50-1000 MG/30ML LIQD Take 15 mLs by mouth at bedtime as needed (sleep).     famotidine (PEPCID) 20 MG tablet TOME 1 PASTILLA POR VIA ORAL DOS VECES AL DIA 60 tablet 3   Ferrous Sulfate (IRON) 325  (65 Fe) MG TABS Take 1 tablet (325 mg total) by mouth 2 (two) times daily. 60 tablet 3   ferrous sulfate 325 (65 FE) MG tablet TAKE 1 TABLET BY MOUTH 2 TIMES DAILY. 60 tablet 3   gabapentin (NEURONTIN) 300 MG capsule Take 1 capsule (300 mg total) by mouth 3 (three) times daily. 90 capsule 0   HYDROcodone-acetaminophen (HYCET) 7.5-325 mg/15 ml solution Take 10 mLs by mouth every 6  hours as needed for moderate pain. OK to refill on 02/05/2021 946 mL 0   HYDROmorphone (DILAUDID) 2 MG tablet Take 1 tablet (2 mg total) by mouth every 4 (four) hours as needed for severe pain. 30 tablet 0   megestrol (MEGACE) 20 MG tablet Take 1 tablet by mouth 2 times daily. 60 tablet 0   morphine (MS CONTIN) 60 MG 12 hr tablet Take 1 tablet (60 mg total) by mouth every 12 (twelve) hours. 30 tablet 0   pantoprazole (PROTONIX) 40 MG tablet Take 1 tablet (40 mg total) by mouth 2 (two) times daily before a meal. 60 tablet 3   prochlorperazine (COMPAZINE) 10 MG tablet Take 1 tablet by mouth every 6 hours as needed for nausea or vomiting. 60 tablet 2   senna-docusate (SENOKOT S) 8.6-50 MG tablet Take 2 tablet by mouth once a day 180 tablet 1   sucralfate (CARAFATE) 1 g tablet Take 1 tablet by mouth 4 times daily. (Dissolve in 15 mls of water before taking) 120 tablet 2   No current facility-administered medications for this visit.   Facility-Administered Medications Ordered in Other  Visits  Medication Dose Route Frequency Provider Last Rate Last Admin   diphenhydrAMINE (BENADRYL) 50 MG/ML injection            diphenhydrAMINE (BENADRYL) 50 MG/ML injection            famotidine (PEPCID) 20 mg IVPB            famotidine (PEPCID) 20 mg IVPB            palonosetron (ALOXI) 0.25 MG/5ML injection            palonosetron (ALOXI) 0.25 MG/5ML injection             VITAL SIGNS: BP 93/69 (BP Location: Right Arm, Patient Position: Sitting)    Pulse (!) 117    Temp 98.2 F (36.8 C) (Oral)    Resp 18    Wt 152 lb 6 oz (69.1 kg)     SpO2 100%    BMI 20.67 kg/m  Filed Weights   02/18/21 1405  Weight: 152 lb 6 oz (69.1 kg)    Estimated body mass index is 20.67 kg/m as calculated from the following:   Height as of 02/15/21: 6' (1.829 m).   Weight as of this encounter: 152 lb 6 oz (69.1 kg).   PERFORMANCE STATUS (ECOG) : 1 - Symptomatic but completely ambulatory   Physical Exam General: NAD, sitting in wheelchair Cardiovascular:Tachycardic  Pulmonary: clear ant fields Abdomen: soft, nontender, + bowel sounds Extremities: no edema, no joint deformities Neurological: Weakness but otherwise nonfocal  IMPRESSION:  [name] presents to the clinic today with his sister. He is sitting in a wheelchair. No acute distress noted.   Cancer related pain, Sacral, back, bilateral lower extremity weakness, bone mets Ansen recently had a PET scan (1/17) which showed worsening sacral lesion. He states his pain has increased significantly over the past 1-2 weeks. He was seen by Dr. Burr Medico on 1/20 and his MS Contin was increased to 60 mg every 12 hours. He reports some improvement in pain although not a total relief. We discussed his pain may not completely resolve but the goal is to hopefully get him to a comfortable state. He verbalized understanding.   He is now taking dilaudid 2mg  every 4hours as needed for breakthrough pain. Reports some relief when taking. He will plan to continue current regimen and we will closely follow for changes. Education provided if pain is severe he may take 1-2 tablets of Dilaudid for breakthrough.   He is taking gabapentin twice daily. Education provided to take 1 capsule twice daily and 2 capsules at bedtime.   Weight loss/decreased appetite He reports his appetite is improving. We discussed importance of water intake given new changes to his chemo regimen. Weight is 152.6 today compared to 150 on 1/20. He is tolerating Megace 20 mg BID.   Constipation No concerns at this time with  constipation. Education provided on bowel regimen and signs of diarrhea related to irinotecan.   I discussed the importance of continued conversation with family and their medical providers regarding overall plan of care and treatment options, ensuring decisions are within the context of the patients values and GOCs.  PLAN: Continue with MS Contin 60 mg twice daily for pain. If pain continues to escalate will consider every 8 hours vs every 12hrs.  Dilaudid 2mg  every 3-4 hrs as needed for breakthrough pain. Gabapentin 300mg  BID and 600mg  QHS  I will plan to see pt back in 2 weeks. Sooner if needed.  Patient and sister expressed understanding and was in agreement with this plan. He also understands that He can call the clinic at any time with any questions, concerns, or complaints.   Time Total: 45 min.   Visit consisted of counseling and education dealing with the complex and emotionally intense issues of symptom management and palliative care in the setting of serious and potentially life-threatening illness.Greater than 50%  of this time was spent counseling and coordinating care related to the above assessment and plan.  Signed by: Alda Lea, AGPCNP-BC [place]

## 2021-02-19 ENCOUNTER — Telehealth: Payer: Self-pay | Admitting: Hematology

## 2021-02-19 NOTE — [facility]ational Nurse Program (Signed)
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 Yes    Location Patient [place] or Organization    Patient Status Unknown    Insurance Uninsured ([place] [employer]/Self-Pay)    Insurance Referral N/A    Medication Referred to Medication Assistance;Have Medication Insecurities    Medical Provider Yes    Screening Referrals N/A    Medical Referral [facility] P[facility]    Medical Appointment Made N/A    Food N/A    Transportation N/A    Housing/Utilities N/A    Interpersonal Safety N/A    Intervention [place]    ED Visit Averted N/A    Life-Saving Intervention Made N/A            Patient referred to [facility]) to seek medication assistance. Messaged left with staff member. Patient does not have health insurance and needs assistance with medication management.

## 2021-02-19 NOTE — Telephone Encounter (Signed)
Scheduled follow-up appointments per 1/20 los. Patient's niece is aware.

## 2021-02-20 ENCOUNTER — Other Ambulatory Visit: Payer: Self-pay

## 2021-02-20 ENCOUNTER — Telehealth: Payer: Self-pay | Admitting: Hematology

## 2021-02-20 NOTE — Telephone Encounter (Signed)
Copied from [place] [phone]. Topic: General - Other >> Feb 19, 2021  3:41 PM Loma [place] wrote: Reason for CRM: Gigi Gin from [facility] was trying to reach Cheryle Horsfall re further help for this terminal cancer pt who has been transferred to hospice and funds are gone. She is requesting CB at [phone]

## 2021-02-21 ENCOUNTER — Encounter (HOSPITAL_COMMUNITY): Payer: Self-pay

## 2021-02-21 ENCOUNTER — Other Ambulatory Visit: Payer: Self-pay | Admitting: Hematology

## 2021-02-21 ENCOUNTER — Observation Stay (HOSPITAL_COMMUNITY): Payer: Self-pay

## 2021-02-21 ENCOUNTER — Inpatient Hospital Stay (HOSPITAL_COMMUNITY)
Admission: EM | Admit: 2021-02-21 | Discharge: 2021-03-10 | DRG: 542 | Disposition: A | Discharge status: Hospice | Payer: Self-pay | Attending: Internal Medicine | Admitting: Internal Medicine

## 2021-02-21 ENCOUNTER — Emergency Department (HOSPITAL_COMMUNITY): Payer: Self-pay

## 2021-02-21 ENCOUNTER — Other Ambulatory Visit: Payer: Self-pay

## 2021-02-21 DIAGNOSIS — D630 Anemia in neoplastic disease: Secondary | ICD-10-CM | POA: Diagnosis present

## 2021-02-21 DIAGNOSIS — C159 Malignant neoplasm of esophagus, unspecified: Secondary | ICD-10-CM

## 2021-02-21 DIAGNOSIS — K5900 Constipation, unspecified: Secondary | ICD-10-CM | POA: Diagnosis not present

## 2021-02-21 DIAGNOSIS — Z7952 Long term (current) use of systemic steroids: Secondary | ICD-10-CM

## 2021-02-21 DIAGNOSIS — Z66 Do not resuscitate: Secondary | ICD-10-CM | POA: Diagnosis present

## 2021-02-21 DIAGNOSIS — E611 Iron deficiency: Secondary | ICD-10-CM | POA: Diagnosis present

## 2021-02-21 DIAGNOSIS — R627 Adult failure to thrive: Secondary | ICD-10-CM | POA: Diagnosis present

## 2021-02-21 DIAGNOSIS — Z800 Family history of malignant neoplasm of digestive organs: Secondary | ICD-10-CM

## 2021-02-21 DIAGNOSIS — G253 Myoclonus: Secondary | ICD-10-CM

## 2021-02-21 DIAGNOSIS — T380X5A Adverse effect of glucocorticoids and synthetic analogues, initial encounter: Secondary | ICD-10-CM

## 2021-02-21 DIAGNOSIS — D649 Anemia, unspecified: Secondary | ICD-10-CM

## 2021-02-21 DIAGNOSIS — Z515 Encounter for palliative care: Secondary | ICD-10-CM

## 2021-02-21 DIAGNOSIS — C7951 Secondary malignant neoplasm of bone: Principal | ICD-10-CM | POA: Diagnosis present

## 2021-02-21 DIAGNOSIS — C799 Secondary malignant neoplasm of unspecified site: Secondary | ICD-10-CM

## 2021-02-21 DIAGNOSIS — G9341 Metabolic encephalopathy: Secondary | ICD-10-CM | POA: Diagnosis present

## 2021-02-21 DIAGNOSIS — IMO0001 Reserved for inherently not codable concepts without codable children: Secondary | ICD-10-CM | POA: Diagnosis present

## 2021-02-21 DIAGNOSIS — Z23 Encounter for immunization: Secondary | ICD-10-CM

## 2021-02-21 DIAGNOSIS — F1010 Alcohol abuse, uncomplicated: Secondary | ICD-10-CM | POA: Diagnosis present

## 2021-02-21 DIAGNOSIS — Z923 Personal history of irradiation: Secondary | ICD-10-CM

## 2021-02-21 DIAGNOSIS — R531 Weakness: Secondary | ICD-10-CM | POA: Diagnosis present

## 2021-02-21 DIAGNOSIS — R29898 Other symptoms and signs involving the musculoskeletal system: Secondary | ICD-10-CM

## 2021-02-21 DIAGNOSIS — G893 Neoplasm related pain (acute) (chronic): Secondary | ICD-10-CM

## 2021-02-21 DIAGNOSIS — T402X5A Adverse effect of other opioids, initial encounter: Secondary | ICD-10-CM | POA: Diagnosis present

## 2021-02-21 DIAGNOSIS — Z20822 Contact with and (suspected) exposure to covid-19: Secondary | ICD-10-CM | POA: Diagnosis present

## 2021-02-21 DIAGNOSIS — E871 Hypo-osmolality and hyponatremia: Secondary | ICD-10-CM | POA: Diagnosis present

## 2021-02-21 DIAGNOSIS — Z79899 Other long term (current) drug therapy: Secondary | ICD-10-CM

## 2021-02-21 DIAGNOSIS — R39198 Other difficulties with micturition: Secondary | ICD-10-CM

## 2021-02-21 DIAGNOSIS — E876 Hypokalemia: Secondary | ICD-10-CM | POA: Diagnosis present

## 2021-02-21 DIAGNOSIS — C154 Malignant neoplasm of middle third of esophagus: Secondary | ICD-10-CM | POA: Diagnosis present

## 2021-02-21 DIAGNOSIS — C419 Malignant neoplasm of bone and articular cartilage, unspecified: Secondary | ICD-10-CM | POA: Diagnosis present

## 2021-02-21 DIAGNOSIS — F419 Anxiety disorder, unspecified: Secondary | ICD-10-CM | POA: Diagnosis present

## 2021-02-21 DIAGNOSIS — R739 Hyperglycemia, unspecified: Secondary | ICD-10-CM | POA: Diagnosis present

## 2021-02-21 DIAGNOSIS — M8448XA Pathological fracture, other site, initial encounter for fracture: Secondary | ICD-10-CM | POA: Diagnosis present

## 2021-02-21 DIAGNOSIS — R300 Dysuria: Secondary | ICD-10-CM | POA: Diagnosis present

## 2021-02-21 DIAGNOSIS — Z79891 Long term (current) use of opiate analgesic: Secondary | ICD-10-CM

## 2021-02-21 LAB — CBC WITH DIFFERENTIAL/PLATELET
Abs Immature Granulocytes: 0.03 10*3/uL (ref 0.00–0.07)
Basophils Absolute: 0 10*3/uL (ref 0.0–0.1)
Basophils Relative: 0 %
Eosinophils Absolute: 0.1 10*3/uL (ref 0.0–0.5)
Eosinophils Relative: 2 %
HCT: 26.4 % — ABNORMAL LOW (ref 39.0–52.0)
Hemoglobin: 8.7 g/dL — ABNORMAL LOW (ref 13.0–17.0)
Immature Granulocytes: 1 %
Lymphocytes Relative: 6 %
Lymphs Abs: 0.2 10*3/uL — ABNORMAL LOW (ref 0.7–4.0)
MCH: 32.1 pg (ref 26.0–34.0)
MCHC: 33 g/dL (ref 30.0–36.0)
MCV: 97.4 fL (ref 80.0–100.0)
Monocytes Absolute: 0.4 10*3/uL (ref 0.1–1.0)
Monocytes Relative: 12 %
Neutro Abs: 2.8 10*3/uL (ref 1.7–7.7)
Neutrophils Relative %: 79 %
Platelets: 169 10*3/uL (ref 150–400)
RBC: 2.71 MIL/uL — ABNORMAL LOW (ref 4.22–5.81)
RDW: 14.6 % (ref 11.5–15.5)
WBC: 3.6 10*3/uL — ABNORMAL LOW (ref 4.0–10.5)
nRBC: 0 % (ref 0.0–0.2)

## 2021-02-21 LAB — I-STAT CHEM 8, ED
BUN: 3 mg/dL — ABNORMAL LOW (ref 6–20)
Calcium, Ion: 1.12 mmol/L — ABNORMAL LOW (ref 1.15–1.40)
Chloride: 103 mmol/L (ref 98–111)
Creatinine, Ser: 0.3 mg/dL — ABNORMAL LOW (ref 0.61–1.24)
Glucose, Bld: 101 mg/dL — ABNORMAL HIGH (ref 70–99)
HCT: 25 % — ABNORMAL LOW (ref 39.0–52.0)
Hemoglobin: 8.5 g/dL — ABNORMAL LOW (ref 13.0–17.0)
Potassium: 3.5 mmol/L (ref 3.5–5.1)
Sodium: 136 mmol/L (ref 135–145)
TCO2: 24 mmol/L (ref 22–32)

## 2021-02-21 LAB — CBC
HCT: 27.7 % — ABNORMAL LOW (ref 39.0–52.0)
Hemoglobin: 8.9 g/dL — ABNORMAL LOW (ref 13.0–17.0)
MCH: 31.4 pg (ref 26.0–34.0)
MCHC: 32.1 g/dL (ref 30.0–36.0)
MCV: 97.9 fL (ref 80.0–100.0)
Platelets: 168 10*3/uL (ref 150–400)
RBC: 2.83 MIL/uL — ABNORMAL LOW (ref 4.22–5.81)
RDW: 14.5 % (ref 11.5–15.5)
WBC: 2.6 10*3/uL — ABNORMAL LOW (ref 4.0–10.5)
nRBC: 0 % (ref 0.0–0.2)

## 2021-02-21 LAB — COMPREHENSIVE METABOLIC PANEL
ALT: 15 U/L (ref 0–44)
AST: 14 U/L — ABNORMAL LOW (ref 15–41)
Albumin: 3.1 g/dL — ABNORMAL LOW (ref 3.5–5.0)
Alkaline Phosphatase: 58 U/L (ref 38–126)
Anion gap: 5 (ref 5–15)
BUN: 8 mg/dL (ref 6–20)
CO2: 25 mmol/L (ref 22–32)
Calcium: 8.1 mg/dL — ABNORMAL LOW (ref 8.9–10.3)
Chloride: 101 mmol/L (ref 98–111)
Creatinine, Ser: 0.48 mg/dL — ABNORMAL LOW (ref 0.61–1.24)
GFR, Estimated: >60 mL/min (ref >60)
Glucose, Bld: 105 mg/dL — ABNORMAL HIGH (ref 70–99)
Potassium: 3.3 mmol/L — ABNORMAL LOW (ref 3.5–5.1)
Sodium: 131 mmol/L — ABNORMAL LOW (ref 135–145)
Total Bilirubin: 0.5 mg/dL (ref 0.3–1.2)
Total Protein: 6.2 g/dL — ABNORMAL LOW (ref 6.5–8.1)

## 2021-02-21 LAB — RESP PANEL BY RT-PCR (FLU A&B, COVID) ARPGX2
Influenza A by PCR: NEGATIVE
Influenza B by PCR: NEGATIVE
SARS Coronavirus 2 by RT PCR: NEGATIVE

## 2021-02-21 LAB — CREATININE, SERUM
Creatinine, Ser: 0.35 mg/dL — ABNORMAL LOW (ref 0.61–1.24)
GFR, Estimated: >60 mL/min (ref >60)

## 2021-02-21 LAB — HIV ANTIBODY (ROUTINE TESTING W REFLEX): HIV Screen 4th Generation wRfx: NONREACTIVE

## 2021-02-21 MED ORDER — HYDROMORPHONE HCL 1 MG/ML IJ SOLN
1.0000 mg | Freq: Once | INTRAMUSCULAR | Status: AC
  Administered 2021-02-21: 1 mg via INTRAVENOUS
  Filled 2021-02-21: qty 1

## 2021-02-21 MED ORDER — MEGESTROL ACETATE 20 MG PO TABS
20.0000 mg | ORAL_TABLET | Freq: Two times a day (BID) | ORAL | Status: DC
  Administered 2021-02-22: 20 mg via ORAL
  Filled 2021-02-21 (×3): qty 1

## 2021-02-21 MED ORDER — LORAZEPAM 1 MG PO TABS
0.0000 mg | ORAL_TABLET | Freq: Two times a day (BID) | ORAL | Status: AC
  Administered 2021-02-24 – 2021-02-25 (×3): 1 mg via ORAL
  Filled 2021-02-21 (×3): qty 1

## 2021-02-21 MED ORDER — ENOXAPARIN SODIUM 40 MG/0.4ML IJ SOSY
40.0000 mg | PREFILLED_SYRINGE | INTRAMUSCULAR | Status: DC
  Administered 2021-02-21 – 2021-02-23 (×3): 40 mg via SUBCUTANEOUS
  Filled 2021-02-21 (×3): qty 0.4

## 2021-02-21 MED ORDER — ACETAMINOPHEN 325 MG PO TABS: 650.0000 mg | ORAL_TABLET | Freq: Four times a day (QID) | ORAL | Status: DC | PRN

## 2021-02-21 MED ORDER — DEXAMETHASONE SODIUM PHOSPHATE 4 MG/ML IJ SOLN
4.0000 mg | Freq: Four times a day (QID) | INTRAMUSCULAR | Status: DC
  Administered 2021-02-21 – 2021-03-10 (×66): 4 mg via INTRAVENOUS
  Filled 2021-02-21 (×66): qty 1

## 2021-02-21 MED ORDER — SUCRALFATE 1 G PO TABS
1.0000 g | ORAL_TABLET | Freq: Four times a day (QID) | ORAL | Status: DC
  Administered 2021-02-21 – 2021-02-22 (×5): 1 g via ORAL
  Filled 2021-02-21 (×5): qty 1

## 2021-02-21 MED ORDER — FOLIC ACID 1 MG PO TABS
1.0000 mg | ORAL_TABLET | Freq: Every day | ORAL | Status: DC
  Administered 2021-02-22 – 2021-03-10 (×17): 1 mg via ORAL
  Filled 2021-02-21 (×17): qty 1

## 2021-02-21 MED ORDER — PROCHLORPERAZINE MALEATE 10 MG PO TABS
10.0000 mg | ORAL_TABLET | Freq: Four times a day (QID) | ORAL | Status: DC | PRN
  Filled 2021-02-21: qty 1

## 2021-02-21 MED ORDER — HYDROMORPHONE HCL 2 MG PO TABS: 2.0000 mg | ORAL_TABLET | ORAL | Status: DC | PRN

## 2021-02-21 MED ORDER — ACETAMINOPHEN 500 MG PO TABS: 1000.0000 mg | ORAL_TABLET | Freq: Every evening | ORAL | Status: DC | PRN

## 2021-02-21 MED ORDER — LORAZEPAM 2 MG/ML IJ SOLN: 1.0000 mg | INTRAMUSCULAR | Status: AC | PRN

## 2021-02-21 MED ORDER — DIPHENHYDRAMINE-APAP (SLEEP) 50-1000 MG/30ML PO LIQD: 15.0000 mL | Freq: Every evening | ORAL | Status: DC | PRN

## 2021-02-21 MED ORDER — IOHEXOL 300 MG/ML  SOLN
100.0000 mL | Freq: Once | INTRAMUSCULAR | Status: AC | PRN
  Administered 2021-02-21: 100 mL via INTRAVENOUS

## 2021-02-21 MED ORDER — GADOBUTROL 1 MMOL/ML IV SOLN
7.0000 mL | Freq: Once | INTRAVENOUS | Status: AC | PRN
  Administered 2021-02-21: 7 mL via INTRAVENOUS

## 2021-02-21 MED ORDER — DEXAMETHASONE SODIUM PHOSPHATE 4 MG/ML IJ SOLN: 4.0000 mg | Freq: Four times a day (QID) | INTRAMUSCULAR | Status: DC

## 2021-02-21 MED ORDER — ADULT MULTIVITAMIN W/MINERALS CH
1.0000 | ORAL_TABLET | Freq: Every day | ORAL | Status: DC
  Administered 2021-02-22 – 2021-03-10 (×17): 1 via ORAL
  Filled 2021-02-21 (×17): qty 1

## 2021-02-21 MED ORDER — LORAZEPAM 1 MG PO TABS
1.0000 mg | ORAL_TABLET | ORAL | Status: AC | PRN
  Administered 2021-02-22: 1 mg via ORAL
  Filled 2021-02-21: qty 1

## 2021-02-21 MED ORDER — THIAMINE HCL 100 MG PO TABS
100.0000 mg | ORAL_TABLET | Freq: Every day | ORAL | Status: DC
  Administered 2021-02-21 – 2021-02-23 (×3): 100 mg via ORAL
  Filled 2021-02-21 (×4): qty 1

## 2021-02-21 MED ORDER — HYDROMORPHONE HCL 1 MG/ML IJ SOLN
1.0000 mg | INTRAMUSCULAR | Status: DC | PRN
Start: 2021-02-21 — End: 2021-02-26
  Administered 2021-02-22 – 2021-02-25 (×15): 1 mg via INTRAVENOUS
  Filled 2021-02-21 (×15): qty 1

## 2021-02-21 MED ORDER — PANTOPRAZOLE SODIUM 40 MG PO TBEC
40.0000 mg | DELAYED_RELEASE_TABLET | Freq: Two times a day (BID) | ORAL | Status: DC
  Administered 2021-02-22 – 2021-03-10 (×33): 40 mg via ORAL
  Filled 2021-02-21 (×33): qty 1

## 2021-02-21 MED ORDER — GABAPENTIN 300 MG PO CAPS
300.0000 mg | ORAL_CAPSULE | Freq: Three times a day (TID) | ORAL | Status: DC
Start: 2021-02-21 — End: 2021-03-10
  Administered 2021-02-21 – 2021-03-10 (×50): 300 mg via ORAL
  Filled 2021-02-21 (×50): qty 1

## 2021-02-21 MED ORDER — MORPHINE SULFATE ER 30 MG PO TBCR
60.0000 mg | EXTENDED_RELEASE_TABLET | Freq: Two times a day (BID) | ORAL | Status: DC
  Administered 2021-02-21: 60 mg via ORAL
  Filled 2021-02-21 (×2): qty 2

## 2021-02-21 MED ORDER — CHLORHEXIDINE GLUCONATE CLOTH 2 % EX PADS
6.0000 | MEDICATED_PAD | Freq: Every day | CUTANEOUS | Status: DC
  Administered 2021-02-22 – 2021-03-10 (×16): 6 via TOPICAL

## 2021-02-21 MED ORDER — SODIUM CHLORIDE 0.9% FLUSH
10.0000 mL | Freq: Two times a day (BID) | INTRAVENOUS | Status: DC
  Administered 2021-02-21 – 2021-03-10 (×29): 10 mL

## 2021-02-21 MED ORDER — LORAZEPAM 1 MG PO TABS
0.0000 mg | ORAL_TABLET | Freq: Four times a day (QID) | ORAL | Status: AC
  Administered 2021-02-22: 1 mg via ORAL
  Filled 2021-02-21: qty 1

## 2021-02-21 MED ORDER — POLYETHYLENE GLYCOL 3350 17 G PO PACK: 17.0000 g | PACK | Freq: Every day | ORAL | Status: DC | PRN

## 2021-02-21 MED ORDER — ACETAMINOPHEN 500 MG PO TABS
1000.0000 mg | ORAL_TABLET | Freq: Three times a day (TID) | ORAL | Status: AC
  Administered 2021-02-21 – 2021-02-24 (×9): 1000 mg via ORAL
  Filled 2021-02-21 (×9): qty 2

## 2021-02-21 MED ORDER — LORAZEPAM 2 MG/ML IJ SOLN
0.5000 mg | INTRAMUSCULAR | Status: AC | PRN
  Administered 2021-02-21 – 2021-03-02 (×2): 0.5 mg via INTRAVENOUS
  Filled 2021-02-21 (×2): qty 1

## 2021-02-21 MED ORDER — HYDROMORPHONE HCL 1 MG/ML IJ SOLN: 1.0000 mg | INTRAMUSCULAR | Status: DC | PRN

## 2021-02-21 MED ORDER — THIAMINE HCL 100 MG/ML IJ SOLN
100.0000 mg | Freq: Every day | INTRAMUSCULAR | Status: DC
  Filled 2021-02-21 (×2): qty 2

## 2021-02-21 MED ORDER — SODIUM CHLORIDE 0.9 % IV BOLUS
1000.0000 mL | Freq: Once | INTRAVENOUS | Status: AC
  Administered 2021-02-21: 1000 mL via INTRAVENOUS

## 2021-02-21 MED ORDER — METHYLPREDNISOLONE SODIUM SUCC 125 MG IJ SOLR
125.0000 mg | Freq: Once | INTRAMUSCULAR | Status: AC
Start: 2021-02-21 — End: 2021-02-21
  Administered 2021-02-21: 125 mg via INTRAVENOUS
  Filled 2021-02-21: qty 2

## 2021-02-21 MED ORDER — SODIUM CHLORIDE (PF) 0.9 % IJ SOLN
INTRAMUSCULAR | Status: AC
  Filled 2021-02-21: qty 50

## 2021-02-21 MED ORDER — DIPHENHYDRAMINE HCL 25 MG PO CAPS
50.0000 mg | ORAL_CAPSULE | Freq: Every evening | ORAL | Status: DC | PRN
  Administered 2021-02-24: 50 mg via ORAL
  Filled 2021-02-21: qty 2

## 2021-02-21 NOTE — Assessment & Plan Note (Addendum)
Chronic, Related to malignancy.  Normal B12, folate. AOCD with iron deficiency.  Hb stable.

## 2021-02-21 NOTE — Hospital Course (Addendum)
46 yo with hx etoh abuse and metastatic esophageal cancer was on palliative chemotherapy with Dr. Burr Medico.  He called Dr. Burr Medico on the day of admission with worsening hip pain and he was directed to the ED.  MRI showed a large sacral met.  He's been started on steroids and radiation therapy.  Palliative care working on pain regimen.  Per Dr. Burr Medico, plan  to transition to home hospice. He will complete Radiation treatment out patient. He has 5 more treatment until 2/17.   Stable for discharge. Discharge delay due to lack of medication available, Pharmacy was closed. Family to get medications today.

## 2021-02-21 NOTE — H&P (Signed)
History and Physical    Jimmy Hawkins OIN:[id number] DOB: [birth year] DOA: 02/21/2021  PCP: [name], MD  Patient coming from: home  I have personally briefly reviewed patient's old medical records in [place]  Chief Complaint: pain  HPI: Jimmy Hawkins is Jimmy Hawkins 46 y.o. male with hx etoh abuse and metastatic esophageal cancer currently on palliative chemotherapy with Dr. Burr Medico.  He called Dr. Burr Medico on the day of admission with worsening hip pain and he was directed to the ED.  He recently saw Dr. Burr Medico on 1/20 and they had discussion regarding his treatment options and decided on plan to try irinotecan.  He had significant pain and his MS contin dose was adjusted and hydrocodone was transitioned to dilaudid.  He presents to the ED with progressive pain.  Also notes urinary issues and lower extremity weakness.  He drinks significantly, 5 beers daily, but denies any hx of withdrawal.  Denies other symptoms like fevers, chills, chest pain, shortness of breath.    ED Course: imaging, labs, pain meds.  Admit to hospitalist.    Review of Systems: As per HPI otherwise all other systems reviewed and are negative.  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 ENDOSCOPY;  Service: Gastroenterology;;   ESOPHAGOGASTRODUODENOSCOPY  08/11/2019   ESOPHAGOGASTRODUODENOSCOPY (EGD) WITH PROPOFOL N/Gurpreet Mariani 08/11/2019   Procedure: ESOPHAGOGASTRODUODENOSCOPY (EGD) WITH PROPOFOL;  Surgeon: [name], MD;  Location: [place];  Service:  Endoscopy;  Laterality: N/Jadarian Mckay;   ESOPHAGOGASTRODUODENOSCOPY (EGD) WITH PROPOFOL N/Lezly Rumpf 11/28/2019   Procedure: ESOPHAGOGASTRODUODENOSCOPY (EGD) WITH PROPOFOL;  Surgeon: Rush Landmark Telford Nab., MD;  Location: [place];  Service: Gastroenterology;  Laterality: N/Vestal Markin;   EUS N/Tarick Parenteau 11/28/2019   Procedure: UPPER ENDOSCOPIC ULTRASOUND (EUS) RADIAL;  Surgeon: [name], MD;  Location: [place];  Service: Gastroenterology;  Laterality: N/Bonna Steury;   IR IMAGING GUIDED PORT INSERTION  12/07/2020    Social History  reports that he has never smoked. He has never used smokeless tobacco. He reports current alcohol use of about 30.0 standard drinks per week. He reports that he does not use drugs.  No Known Allergies  Family History  Problem Relation Age of Onset   Esophageal cancer Mother    Stomach cancer Mother    Healthy Father    Colon cancer Neg Hx    Rectal cancer Neg Hx     Prior to Admission medications   Medication Sig Start Date End Date Taking? Authorizing Provider  dexamethasone (DECADRON) 4 MG tablet Take 1 tablet by mouth once Rennie Hack day WITH FOOD Patient taking differently: Take 4 mg by mouth daily. 02/10/21  Yes   diphenhydrAMINE-APAP, sleep, (TYLENOL PM EXTRA STRENGTH) 50-1000 MG/30ML LIQD Take 15 mLs by mouth at bedtime as needed (sleep).   Yes [provider]  ferrous sulfate 325 (65 FE) MG tablet TAKE 1 TABLET BY MOUTH 2 TIMES DAILY. Patient taking differently: Take 325 mg by mouth 2 (two) times daily. 05/18/20 05/18/21 Yes [name], MD  gabapentin (NEURONTIN) 300 MG capsule Take 1 capsule (  300 mg total) by mouth 3 (three) times daily. 12/28/20  Yes Pickenpack-Cousar, Carlena Sax, NP  HYDROcodone-acetaminophen (HYCET) 7.5-325 mg/15 ml solution Take 10 mLs by mouth every 6  hours as needed for moderate pain. OK to refill on 02/05/2021 02/01/21 07/31/21 Yes [name], MD  HYDROmorphone (DILAUDID) 2 MG tablet Take 1 tablet (2 mg total) by mouth every 4 (four) hours as needed for severe  pain. 02/15/21  Yes [name], MD  megestrol (MEGACE) 20 MG tablet Take 1 tablet by mouth 2 times daily. 01/25/21  Yes [name], MD  morphine (MS CONTIN) 60 MG 12 hr tablet Take 1 tablet (60 mg total) by mouth every 12 (twelve) hours. 02/15/21  Yes [name], MD  pantoprazole (PROTONIX) 40 MG tablet Take 1 tablet (40 mg total) by mouth 2 (two) times daily before Dabney Schanz meal. 10/12/20  Yes [name], MD  prochlorperazine (COMPAZINE) 10 MG tablet Take 1 tablet by mouth every 6 hours as needed for nausea or vomiting. 01/25/21 01/25/22 Yes [name], MD  senna-docusate (SENOKOT S) 8.6-50 MG tablet Take 2 tablet by mouth once Kashena Novitski day Patient taking differently: Take 1 tablet by mouth daily. 02/10/21  Yes   sucralfate (CARAFATE) 1 g tablet Take 1 tablet by mouth 4 times daily. (Dissolve in 15 mls of water before taking) 09/06/20  Yes Levin Erp, PA  famotidine (PEPCID) 20 MG tablet TOME 1 PASTILLA POR VIA ORAL DOS VECES AL DIA 12/20/19 12/19/20  [name], MD  Ferrous Sulfate (IRON) 325 (65 Fe) MG TABS Take 1 tablet (325 mg total) by mouth 2 (two) times daily. 07/20/20 10/03/20  [name], MD  omeprazole (PRILOSEC) 40 MG capsule Take 1 capsule (40 mg total) by mouth daily. 09/12/19 11/28/19  [name], MD    Physical Exam: Vitals:   02/21/21 1700 02/21/21 1730 02/21/21 2033 02/21/21 2033  BP: 101/76 98/76  101/73  Pulse: 98 100  85  Resp: 10 12 16 17   Temp:    98.6 F (37 C)  TempSrc:    Oral  SpO2: 99% 97%  93%  Weight:      Height:       Spanish interpreter used [place] at bedside Constitutional: NAD, calm, comfortable Vitals:   02/21/21 1700 02/21/21 1730 02/21/21 2033 02/21/21 2033  BP: 101/76 98/76  101/73  Pulse: 98 100  85  Resp: 10 12 16 17   Temp:    98.6 F (37 C)  TempSrc:    Oral  SpO2: 99% 97%  93%  Weight:      Height:       Eyes: PERRL, lids and conjunctivae normal Neck: normal, supple Respiratory: clear to auscultation bilaterally, no wheezing, no crackles. Normal  respiratory effort. No accessory muscle use.  Cardiovascular: Regular rate and rhythm, no murmurs / rubs / gallops. No extremity edema. Abdomen: no tenderness, no masses palpated. No hepatosplenomegaly. Bowel sounds positive.  Musculoskeletal: no clubbing / cyanosis. No joint deformity upper and lower extremities. Good ROM, no contractures. Normal muscle tone.  Skin: no rashes, lesions, ulcers. No induration Neurologic: CN 2-12 grossly intact. No saddle anesthesia.  L>R LE weakness.  Weakness to dorsiflexion and plantarflexion greater on L Psychiatric: Normal judgment and insight. Alert and oriented x 3. Normal mood.   Labs on Admission: I have personally reviewed following labs and imaging studies  CBC: Recent Labs  Lab 02/15/21 1001 02/21/21 1409 02/21/21 1606 02/21/21 1802  WBC 5.0 3.6*  --  2.6*  NEUTROABS 4.3  2.8  --   --   HGB 9.1* 8.7* 8.5* 8.9*  HCT 27.7* 26.4* 25.0* 27.7*  MCV 96.9 97.4  --  97.9  PLT 232 169  --  938    Basic Metabolic Panel: Recent Labs  Lab 02/15/21 1001 02/21/21 1409 02/21/21 1606 02/21/21 1802  NA 137 131* 136  --   K 3.3* 3.3* 3.5  --   CL 104 101 103  --   CO2 22 25  --   --   GLUCOSE 131* 105* 101*  --   BUN 7 8 3*  --   CREATININE 0.49* 0.48* 0.30* 0.35*  CALCIUM 9.2 8.1*  --   --     GFR: Estimated Creatinine Clearance: 116.4 mL/min ([place]ary Wilford) (by C-G formula based on SCr of 0.35 mg/dL (L)).  Liver Function Tests: Recent Labs  Lab 02/15/21 1001 02/21/21 1409  AST 22 14*  ALT 16 15  ALKPHOS 62 58  BILITOT 0.3 0.5  PROT 7.0 6.2*  ALBUMIN 3.7 3.1*    Urine analysis:    Component Value Date/Time   COLORURINE YELLOW 11/26/2020 [place] 11/26/2020 1539   LABSPEC 1.010 11/26/2020 1539   [place] 6.0 11/26/2020 1539   [place] 11/26/2020 1539   [place] NEGATIVE 11/26/2020 1539   BILIRUBINUR NEGATIVE 11/26/2020 1539   KETONESUR NEGATIVE 11/26/2020 1539   PROTEINUR NEGATIVE 11/26/2020 1539   NITRITE  NEGATIVE 11/26/2020 1539   LEUKOCYTESUR NEGATIVE 11/26/2020 1539    Radiological Exams on Admission: CT ABDOMEN PELVIS W CONTRAST  Result Date: 02/21/2021 CLINICAL DATA:  Acute abdominal pain. Patient reports right left hip pain. History of esophageal cancer with bone metastasis. EXAM: CT ABDOMEN AND PELVIS WITH CONTRAST TECHNIQUE: Multidetector CT imaging of the abdomen and pelvis was performed using the standard protocol following bolus administration of intravenous contrast. RADIATION DOSE REDUCTION: This exam was performed according to the departmental dose-optimization program which includes automated exposure control, adjustment of the mA and/or kV according to patient size and/or use of iterative reconstruction technique. CONTRAST:  158mL OMNIPAQUE IOHEXOL 300 MG/ML  SOLN COMPARISON:  PET CT 9 days ago FINDINGS: Lower chest: Similar distal esophageal wall thickening to prior. No basilar airspace disease, pleural effusion, or nodule. The heart is normal in size. Hepatobiliary: 13 mm low-density lesion in the inferior right lobe of the liver, series 2, image 39. No other liver lesions. Small gallstones without cholecystitis. No biliary dilatation. Pancreas: No ductal dilatation or inflammation. Spleen: Normal in size without focal abnormality. Splenules anterior and medially. Adrenals/Urinary Tract: Normal adrenal glands. No hydronephrosis or perinephric edema. Homogeneous renal enhancement with symmetric excretion on delayed phase imaging. Tiny hypodensity in the right kidney is too small to characterize but likely small cyst. No renal calculi. Urinary bladder is physiologically distended without wall thickening. Stomach/Bowel: Stomach physiologically distended. There is no small bowel obstruction or inflammation. Normal appendix. Moderate volume of colonic stool without colonic wall thickening. Sigmoid colon is redundant. Left sacral mass approaches the left aspect of the sigmoid colon, however no  direct continuity, fat plane is demonstrated. Vascular/Lymphatic: Normal caliber abdominal aorta. Patent portal and splenic veins. Scattered small retroperitoneal nodes are not enlarged by size criteria. There is no pelvic adenopathy. Reproductive: Prostate is unremarkable. Other: Presacral soft tissue edema, large sacral lesion is described below. Minimal fat in the inguinal canals. Small fat containing umbilical hernia. No ascites or free air. Musculoskeletal: Large sacral metastatic lesion is again seen. This measures at least 8.6 x 6.1 cm  and is centered in the left sacrum, crosses the midline to involve the right aspect of the sacrum. There is extraosseous extension extending in the anterior presacral space. This lesion crosses the left sacroiliac joint and involves the iliac bone. No evidence of new or additional osseous lesions. IMPRESSION: 1. Large sacral metastatic lesion measuring at least 8.6 x 6.1 cm with extraosseous extension. This lesion approaches the left aspect of the sigmoid colon, however no direct continuity with the sigmoid colon is demonstrated. No significant change in recent PET CT. 2. Unchanged 13 mm low-density lesion in the inferior right lobe of the liver, suspicious for metastatic disease. 3. Cholelithiasis without cholecystitis. 4. Similar distal esophageal wall thickening, known esophageal malignancy. Electronically Signed   By: Keith Rake M.D.   On: 02/21/2021 16:43   MR SACRUM SI JOINTS W WO CONTRAST  Result Date: 02/21/2021 CLINICAL DATA:  Esophageal cancer with metastatic disease. EXAM: MRI SACRUM WITH CONTRAST TECHNIQUE: Multiplanar multi-sequence MR imaging of the sacrum was performed. CONTRAST:  7 cc Gadavist COMPARISON:  CT earlier same day. FINDINGS: 8 x 10 mm centrally necrotic metastasis affecting the sacrum and surrounding soft tissues. This affects the S1, S2, S3 and upper S4 segments and is more extensive to the left of midline than the right. Tumor fills the  sacral spinal canal and extends at least as far as the L5-S1 disc space. All sacral neural foramina are involved. Tumor extends into the presacral space and also extends dorsal to the sacrum to the left of midline. Pathologic fracture at the S2 segment with about 4 mm of offset. Tumor crosses the left sacroiliac joint and involves the left iliac bone. Tumor extends into the sacral hiatus on the left. IMPRESSION: Large metastasis affecting the sacrum with central necrosis. Extensive involvement of the S1, S2 and S3 segments with lesser involvement of upper S4. Lesion is more extensive to the left of midline but does involve all sacral foramina. Tumor fills the sacral spinal canal and extends up to the L5-S1 disc level. Tumor extends into the sacral hiatus on the left. Pathologic fracture at S2 with about 4 mm of offset. Crossing of the left SI joint with involvement of the iliac. Electronically Signed   By: Nelson Chimes M.D.   On: 02/21/2021 20:18    EKG: Independently reviewed. none  Assessment/Plan Principal Problem:   Metastatic cancer (HCC) Active Problems:   Malignant neoplasm of esophagus (HCC)   Cancer related pain   Bone metastases (HCC)   Lower extremity weakness   Difficulty urinating   Alcohol abuse   Anemia    Malignant neoplasm of esophagus (HCC) Followed by Dr. Burr Medico Recently had discussion regarding hospice vs continued treatment and decided to continue chemo.  Started on irinotecan on 1/20.   Appreciate her assistance  Bone metastases ([place])- (present on admission) CT with large sacra metastatic lesion 8.6x6.1 with extraosseous extension He's s/p palliative radiation to sacrum 03/26/20-04/10/20 and short course of dex PET 1/17 with increase in size of metastatic lesion in L hemisacrum Will follow up MRI sacrum       Cancer related pain Continue MS contin scheduled and PO dilaudid prn Continue IV dilaudid prn Scheduled APAP Steroids Palliative care for assistance with  pain Continuous pulse ox  Difficulty urinating Likely related to metastatic disease Follow MRI UA, bladder scan  Lower extremity weakness Likely related to mets, discussed with rads, will follow MRI sacrum Start dexamethasone  Anemia Chronic, follow  Alcohol abuse- (present on admission) CIWA  DVT prophylaxis: lovenox  Code Status:   DNR  Family Communication:  Niece at bedside  Disposition Plan:   Patient is from:  home  Anticipated DC to:  home  Anticipated DC date: unknown  Anticipated DC barriers: Improvement in pain control  Consults called:  Palliative care, oncology  Admission status:  obs   Severity of Illness: The appropriate patient status for this patient is OBSERVATION. Observation status is judged to be reasonable and necessary in order to provide the required intensity of service to ensure the patient's safety. The patient's presenting symptoms, physical exam findings, and initial radiographic and laboratory data in the context of their medical condition is felt to place them at decreased risk for further clinical deterioration. Furthermore, it is anticipated that the patient will be medically stable for discharge from the hospital within 2 midnights of admission.     Fayrene Helper MD [facility]  How to contact the [facility] Attending or Consulting provider [place] or covering provider during after hours [place], for this patient?   Check the care team in [facility] and look for Tyannah Sane) attending/consulting [facility] provider listed and b) the [facility] team listed Log into [email or web address] and use [place]'s universal password to access. If you do not have the password, please contact the hospital operator. Locate the [facility] provider you are looking for under [facility] and page to Micah Galeno number that you can be directly reached. If you still have difficulty reaching the provider, please page the [facility] (Director on Call) for the Hospitalists listed on amion for  assistance.  02/21/2021, 8:36 PM

## 2021-02-21 NOTE — Progress Notes (Signed)
Pt stable on arrival to floor though he was sleepy overall. Nursing staff was able to communicate with pt through interpreter service. Pt reports new weakness, twitches, and tremors. He also reports left leg pain and falling at home on that leg in the past couple of days. Rn holding any sedative drugs at this time due to pt lethargy. Pt remains stable at this time. Rn will continue to monitor.

## 2021-02-21 NOTE — Assessment & Plan Note (Addendum)
Likely related to metastatic disease. Continue with  Flomax.  He has been able to urinate.

## 2021-02-21 NOTE — Progress Notes (Addendum)
Jimmy Hawkins   DOB:1975-03-12   GU#:[id number]   SEG#:[id number]  Oncology follow up   Subjective: Patient is well-known to me, under my care for his metastatic esophageal CA, he is currently on palliative chemotherapy.  He called today for worsening hip pain for the past 3 days, and I directed him to [facility] Emergency room.  I saw him in ED, and he was being transferring to radiology. I mainly spoke with his sister.  When I saw her a week ago in the office, I increased his pain medication due to the cancer progression, and switch to his chemo treatment to irinotecan last Friday.  He initially did well after chemo, did develop some fatigue and low appetite to 3 days later, subsequently gets bilateral hip pain got much worse, especially in the left side, pain is unbearable, he also reports difficulty with urination.  No fever or chills.   Objective:  Vitals:   02/21/21 1500 02/21/21 1600  BP: 98/72 103/68  Pulse: 83 82  Resp: 20 14  Temp:    SpO2: 100% 99%    Body mass index is 20.67 kg/m. No intake or output data in the 24 hours ending 02/21/21 1630   Sclerae unicteric  No leg edema   CBG (last 3)  No results for input(s): GLUCAP in the last 72 hours.   Labs:  Urine Studies No results for input(s): UHGB, CRYS in the last 72 hours.  Invalid input(s): UACOL, UAPR, USPG, UPH, UTP, UGL, UKET, UBIL, UNIT, UROB, ULEU, UEPI, UWBC, URBC, UBAC, CAST, UCOM, BILUA  Basic Metabolic Panel: Recent Labs  Lab 02/15/21 1001 02/21/21 1409 02/21/21 1606  NA 137 131* 136  K 3.3* 3.3* 3.5  CL 104 101 103  CO2 22 25  --   GLUCOSE 131* 105* 101*  BUN 7 8 3*  CREATININE 0.49* 0.48* 0.30*  CALCIUM 9.2 8.1*  --    GFR Estimated Creatinine Clearance: 116.4 mL/min (A) (by C-G formula based on SCr of 0.3 mg/dL (L)). Liver Function Tests: Recent Labs  Lab 02/15/21 1001 02/21/21 1409  AST 22 14*  ALT 16 15  ALKPHOS 62 58  BILITOT 0.3 0.5  PROT 7.0 6.2*  ALBUMIN 3.7 3.1*   No  results for input(s): LIPASE, AMYLASE in the last 168 hours. No results for input(s): AMMONIA in the last 168 hours. Coagulation profile No results for input(s): INR, PROTIME in the last 168 hours.  CBC: Recent Labs  Lab 02/15/21 1001 02/21/21 1409 02/21/21 1606  WBC 5.0 3.6*  --   NEUTROABS 4.3 2.8  --   HGB 9.1* 8.7* 8.5*  HCT 27.7* 26.4* 25.0*  MCV 96.9 97.4  --   PLT 232 169  --    Cardiac Enzymes: No results for input(s): CKTOTAL, CKMB, CKMBINDEX, TROPONINI in the last 168 hours. BNP: Invalid input(s): POCBNP CBG: No results for input(s): GLUCAP in the last 168 hours. D-Dimer No results for input(s): DDIMER in the last 72 hours. Hgb A1c No results for input(s): HGBA1C in the last 72 hours. Lipid Profile No results for input(s): CHOL, HDL, LDLCALC, TRIG, CHOLHDL, LDLDIRECT in the last 72 hours. Thyroid function studies No results for input(s): TSH, T4TOTAL, T3FREE, THYROIDAB in the last 72 hours.  Invalid input(s): FREET3 Anemia work up No results for input(s): VITAMINB12, FOLATE, FERRITIN, TIBC, IRON, RETICCTPCT in the last 72 hours. Microbiology No results found for this or any previous visit (from the past 240 hour(s)).    Studies:  No results found.  Assessment: 46 y.o. 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  History of heavy alcohol user 5. DNR    Plan:  -CT scan pending, which I agree. May add some steroid for pain, or if he has cord compression on CT  -Lab work-up reviewed, needs urine test also and culture  -I think he needs hospital admission for pain control  -please call palliative care to assist in his pain management -I have previously discussed with patient and his sister about hospice, since he has progressed through  several line chemotherapy.  He declined the last week, but his sister voiced good understanding, and may talk to him again  -Goals of care and CODE STATUS was discussed before, he is DNR -I will f/u tomorrow    [name], MD 02/21/2021  4:30 PM

## 2021-02-21 NOTE — Assessment & Plan Note (Addendum)
Likely related to mets.  Started  dexamethasone Continue with radiation Tx.  Notice some improvement.

## 2021-02-21 NOTE — ED Triage Notes (Signed)
Pt c/o of right and left hip pain beginning 3 days ago. Patient has taking hydrocodone, and morphine for pain without relief. Patient has hx of esophagus cancer metastasized to bone casncer. Patient also c/o lower back pain.

## 2021-02-21 NOTE — ED Notes (Signed)
Pt reports his pain is keeping him from being able to urinate. This Probation officer and RN assisted pt to stand to attempt to use the bathroom via urinal. Pt was not able to use the bathroom. Placed male purewick on pt.

## 2021-02-21 NOTE — Assessment & Plan Note (Addendum)
Followed by Dr. Burr Medico Plan to transition to Home [facility]. Last radiation Tx 2/17/ he will complete Tx outpatient.

## 2021-02-21 NOTE — Assessment & Plan Note (Addendum)
Completed CIWA. Received high dose thiamine.  No evidence of withdrawal.

## 2021-02-21 NOTE — Assessment & Plan Note (Addendum)
Transitioned to oxycontin 20 mg TID per palliative care Continue IV dilaudid prn Oral Dilaudid prn Steroids Palliative care for assistance with pain - appreciate assistance Pain better controlled.  Palliative added sublingual morphine for rescue pain.

## 2021-02-21 NOTE — Assessment & Plan Note (Addendum)
-  CT with large sacral metastatic lesion 8.6x6.1 with extraosseous extension -PET 1/17 with increase in size of metastatic lesion in L hemisacrum -MRI sacrum with large met with central necrosis, extensive involvement of S1, S2, and S3 segments with lesser involvement of upper S4.  Lesion is more extensive to L of midline, but involves all sacral foramina.  Tumor fills sacral spinal canal and extends up to L5-S1 disc level.  Extends into scaral hiatus on the L.  Pathologic fx at S2.  Grossing of L SI joint with involvement of iliac. Started on Radiation Therapy t 02/26/2021, last dose of Radiation to be complete 2/17. Plan to discharge today home with hospice. He will complete Radiation tx out patient.

## 2021-02-22 ENCOUNTER — Observation Stay (HOSPITAL_COMMUNITY)
Admission: EM | Admit: 2021-02-22 | Discharge: 2021-02-22 | Disposition: A | Discharge status: Home / Self Care | Payer: Self-pay | Source: Home / Self Care | Attending: Family Medicine | Admitting: Family Medicine

## 2021-02-22 ENCOUNTER — Ambulatory Visit
Admit: 2021-02-22 | Discharge: 2021-02-22 | Disposition: A | Discharge status: Home / Self Care | Payer: Self-pay | Attending: Radiation Oncology | Admitting: Radiation Oncology

## 2021-02-22 DIAGNOSIS — T380X5A Adverse effect of glucocorticoids and synthetic analogues, initial encounter: Secondary | ICD-10-CM

## 2021-02-22 DIAGNOSIS — R739 Hyperglycemia, unspecified: Secondary | ICD-10-CM

## 2021-02-22 DIAGNOSIS — C799 Secondary malignant neoplasm of unspecified site: Secondary | ICD-10-CM

## 2021-02-22 DIAGNOSIS — G253 Myoclonus: Secondary | ICD-10-CM

## 2021-02-22 DIAGNOSIS — G9341 Metabolic encephalopathy: Secondary | ICD-10-CM

## 2021-02-22 LAB — COMPREHENSIVE METABOLIC PANEL
ALT: 16 U/L (ref 0–44)
AST: 13 U/L — ABNORMAL LOW (ref 15–41)
Albumin: 3.1 g/dL — ABNORMAL LOW (ref 3.5–5.0)
Alkaline Phosphatase: 59 U/L (ref 38–126)
Anion gap: 8 (ref 5–15)
BUN: 8 mg/dL (ref 6–20)
CO2: 23 mmol/L (ref 22–32)
Calcium: 8.7 mg/dL — ABNORMAL LOW (ref 8.9–10.3)
Chloride: 102 mmol/L (ref 98–111)
Creatinine, Ser: 0.33 mg/dL — ABNORMAL LOW (ref 0.61–1.24)
GFR, Estimated: >60 mL/min (ref >60)
Glucose, Bld: 172 mg/dL — ABNORMAL HIGH (ref 70–99)
Potassium: 3.6 mmol/L (ref 3.5–5.1)
Sodium: 133 mmol/L — ABNORMAL LOW (ref 135–145)
Total Bilirubin: 0.6 mg/dL (ref 0.3–1.2)
Total Protein: 6.8 g/dL (ref 6.5–8.1)

## 2021-02-22 LAB — CBC
HCT: 28 % — ABNORMAL LOW (ref 39.0–52.0)
Hemoglobin: 9.3 g/dL — ABNORMAL LOW (ref 13.0–17.0)
MCH: 32.3 pg (ref 26.0–34.0)
MCHC: 33.2 g/dL (ref 30.0–36.0)
MCV: 97.2 fL (ref 80.0–100.0)
Platelets: 173 10*3/uL (ref 150–400)
RBC: 2.88 MIL/uL — ABNORMAL LOW (ref 4.22–5.81)
RDW: 14.3 % (ref 11.5–15.5)
WBC: 2.2 10*3/uL — ABNORMAL LOW (ref 4.0–10.5)
nRBC: 0 % (ref 0.0–0.2)

## 2021-02-22 LAB — BLOOD GAS, VENOUS
Bicarbonate: 23.8 mmol/L (ref 20.0–28.0)
O2 Saturation: 72 %
pCO2, Ven: 33.5 mmHg — ABNORMAL LOW (ref 44.0–60.0)
pH, Ven: 7.466 — ABNORMAL HIGH (ref 7.250–7.430)
pO2, Ven: 40.3 mmHg (ref 32.0–45.0)

## 2021-02-22 LAB — TSH: TSH: 0.533 u[IU]/mL (ref 0.350–4.500)

## 2021-02-22 LAB — FOLATE: Folate: 8.5 ng/mL (ref >5.9)

## 2021-02-22 LAB — HEMOGLOBIN A1C
Hgb A1c MFr Bld: 4.8 % (ref 4.8–5.6)
Mean Plasma Glucose: 91.06 mg/dL

## 2021-02-22 LAB — VITAMIN B12: Vitamin B-12: 1753 pg/mL — ABNORMAL HIGH (ref 180–914)

## 2021-02-22 LAB — GLUCOSE, CAPILLARY
Glucose-Capillary: 127 mg/dL — ABNORMAL HIGH (ref 70–99)
Glucose-Capillary: 172 mg/dL — ABNORMAL HIGH (ref 70–99)
Glucose-Capillary: 142 mg/dL — ABNORMAL HIGH (ref 70–99)

## 2021-02-22 LAB — AMMONIA: Ammonia: 23 umol/L (ref 9–35)

## 2021-02-22 MED ORDER — THIAMINE HCL 100 MG/ML IJ SOLN
500.0000 mg | Freq: Three times a day (TID) | INTRAMUSCULAR | Status: AC
  Administered 2021-02-22 – 2021-02-24 (×8): 500 mg via INTRAVENOUS
  Filled 2021-02-22 (×12): qty 5

## 2021-02-22 MED ORDER — THIAMINE HCL 100 MG PO TABS
100.0000 mg | ORAL_TABLET | Freq: Every day | ORAL | Status: DC
  Administered 2021-03-02 – 2021-03-10 (×9): 100 mg via ORAL
  Filled 2021-02-22 (×9): qty 1

## 2021-02-22 MED ORDER — INSULIN ASPART 100 UNIT/ML IJ SOLN: 0.0000 [IU] | Freq: Three times a day (TID) | INTRAMUSCULAR | Status: DC

## 2021-02-22 MED ORDER — MORPHINE SULFATE ER 15 MG PO TBCR
30.0000 mg | EXTENDED_RELEASE_TABLET | Freq: Three times a day (TID) | ORAL | Status: DC
  Administered 2021-02-22 – 2021-02-23 (×4): 30 mg via ORAL
  Filled 2021-02-22 (×4): qty 2

## 2021-02-22 MED ORDER — THIAMINE HCL 100 MG/ML IJ SOLN
250.0000 mg | Freq: Every day | INTRAVENOUS | Status: AC
  Administered 2021-02-25 – 2021-03-01 (×5): 250 mg via INTRAVENOUS
  Filled 2021-02-22 (×5): qty 2.5

## 2021-02-22 MED ORDER — SUCRALFATE 1 GM/10ML PO SUSP
1.0000 g | Freq: Four times a day (QID) | ORAL | Status: DC
  Administered 2021-02-22 – 2021-03-10 (×61): 1 g via ORAL
  Filled 2021-02-22 (×61): qty 10

## 2021-02-22 MED ORDER — MEGESTROL ACETATE 400 MG/10ML PO SUSP
400.0000 mg | Freq: Every day | ORAL | Status: DC
  Administered 2021-02-23 – 2021-03-10 (×16): 400 mg via ORAL
  Filled 2021-02-22 (×16): qty 10

## 2021-02-22 NOTE — Progress Notes (Addendum)
Report given to Lottie Dawson, RN. Patient in the middle of having EEG right now. Will have patient transported when EEG is done.

## 2021-02-22 NOTE — Progress Notes (Signed)
PROGRESS NOTE    Jimmy Hawkins  XNT:[id number] DOB: [birth year] DOA: 02/21/2021 PCP: Jimmy Merle, MD  No chief complaint on file.   Brief Narrative:  46 yo with hx etoh abuse and metastatic esophageal cancer currently on palliative chemotherapy with Dr. Burr Hawkins.  He called Dr. Burr Hawkins on the day of admission with worsening hip pain and he was directed to the ED.  He recently saw Dr. Burr Hawkins on 1/20 and they had discussion regarding his treatment options and decided on plan to try irinotecan.  He had significant pain and his MS contin dose was adjusted and hydrocodone was transitioned to dilaudid.  He presents to the ED with progressive pain.  Also notes urinary issues and lower extremity weakness.  He drinks significantly, 5 beers daily, but denies any hx of withdrawal.  Denies other symptoms like fevers, chills, chest pain, shortness of breath.      Assessment & Plan:   Principal Problem:   Metastatic cancer ([place]) Active Problems:   Malignant neoplasm of esophagus (HCC)   Cancer related pain   Bone metastases (HCC)   Lower extremity weakness   Difficulty urinating   Alcohol abuse   Acute metabolic encephalopathy   Myoclonic jerking   Anemia   Steroid-induced hyperglycemia   Malignant neoplasm of esophagus (HCC) Followed by Dr. Burr Hawkins Recently had discussion regarding hospice vs continued treatment and decided to continue chemo.  Started on irinotecan on 1/20.   Appreciate her assistance  Bone metastases ([place])- (present on admission) CT with large sacral metastatic lesion 8.6x6.1 with extraosseous extension He's s/p palliative radiation to sacrum 03/26/20-04/10/20 and short course of dex PET 1/17 with increase in size of metastatic lesion in L hemisacrum MRI sacrum with large met with central necrosis, extensive involvement of S1, S2, and S3 segments with lesser involvement of upper S4.  Lesion is more extensive to L of midline, but involves all sacral foramina.  Tumor fills sacral  spinal canal and extends up to L5-S1 disc level.  Extends into scaral hiatus on the L.  Pathologic fx at S2.  Grossing of L SI joint with involvement of iliac.  Planning for evaluation by radiation oncology today      Cancer related pain Continue MS contin scheduled Continue IV dilaudid prn He's Jimmy Hawkins little encephalopathic today, some myoclonic jerks, will back off on MS contin to previous dose, stop oral dilaudid, leave prn IV dilaudid Scheduled APAP Steroids Palliative care for assistance with pain Continuous pulse ox  Difficulty urinating Likely related to metastatic disease Follow MRI  UA, bladder scan  Lower extremity weakness Likely related to mets, discussed with rads, will follow MRI sacrum (as above) Start dexamethasone Radiation oncology c/s as above PT/OT  Myoclonic jerking Related to pain meds Will back off to prior long acting dose, follow VBG, ammonia, EEG  Acute metabolic encephalopathy Suspect related to pain meds Ammonia, b12, folate, rpr, VBG EEG with myoclonic jerking Consider brain imaging  High dose thiamine given hx etoh abuse   Alcohol abuse- (present on admission) CIWA High dose thiamine with encephalopathy  Anemia Chronic, follow B12, folate, iron panel  Steroid-induced hyperglycemia SSI, A1c   DVT prophylaxis: lovenox Code Status: DNR Family Communication: niece, sister Disposition:   Status is: Observation  The patient will require care spanning > 2 midnights and should be moved to inpatient because: need for evaluation of encephalopathy, rad eval, oncology eval, palliative care eval      Consultants:  Rad onc oncology  Procedures:  none  Antimicrobials:  Anti-infectives (From admission, onward)    None       Subjective: Denies pain Falling alseep at times, staring at times Sister and niece at bedside Interpreter used  Objective: Vitals:   02/21/21 2033 02/21/21 2358 02/22/21 0222 02/22/21 0504  BP:  101/73 103/66 102/63 103/64  Pulse: 85 84 87 80  Resp: _0 Temp: 98.6 F (37 C) 98 F (36.7 C) 98.1 F (36.7 C) 98 F (36.7 C)  TempSrc: Oral Oral Oral Oral  SpO2: 93%  100% 100%  Weight:      Height:        Intake/Output Summary (Last 24 hours) at 02/22/2021 0940 Last data filed at 02/22/2021 0929 Gross per 24 hour  Intake 970 ml  Output 2300 ml  Net -1330 ml   Filed Weights   02/21/21 1301  Weight: 69.1 kg    Examination:  General: No acute distress. Cardiovascular: RRR Lungs: unlabored Abdomen: Soft, nontender, nondistended  Neurological: alert, occasional spells of staring, symmetric upper extremity strength, L>RLE weakness, L>R LE weakness with dorsi/plantar flexion Skin: Warm and dry. No rashes or lesions. Extremities: No clubbing or cyanosis. No edema     Data Reviewed: I have personally reviewed following labs and imaging studies  CBC: Recent Labs  Lab 02/15/21 1001 02/21/21 1409 02/21/21 1606 02/21/21 1802 02/22/21 0439  WBC 5.0 3.6*  --  2.6* 2.2*  NEUTROABS 4.3 2.8  --   --   --   HGB 9.1* 8.7* 8.5* 8.9* 9.3*  HCT 27.7* 26.4* 25.0* 27.7* 28.0*  MCV 96.9 97.4  --  97.9 97.2  PLT 232 169  --  168 882    Basic Metabolic Panel: Recent Labs  Lab 02/15/21 1001 02/21/21 1409 02/21/21 1606 02/21/21 1802 02/22/21 0439  NA 137 131* 136  --  133*  K 3.3* 3.3* 3.5  --  3.6  CL 104 101 103  --  102  CO2 22 25  --   --  23  GLUCOSE 131* 105* 101*  --  172*  BUN 7 8 3*  --  8  CREATININE 0.49* 0.48* 0.30* 0.35* 0.33*  CALCIUM 9.2 8.1*  --   --  8.7*    GFR: Estimated Creatinine Clearance: 116.4 mL/min (Jimmy Hawkins) (by C-G formula based on SCr of 0.33 mg/dL (L)).  Liver Function Tests: Recent Labs  Lab 02/15/21 1001 02/21/21 1409 02/22/21 0439  AST 22 14* 13*  ALT _1 ALKPHOS 62 58 59  BILITOT 0.3 0.5 0.6  PROT 7.0 6.2* 6.8  ALBUMIN 3.7 3.1* 3.1*    CBG: No results for input(s): GLUCAP in the last 168 hours.   Recent  Results (from the past 240 hour(s))  Resp Panel by RT-PCR (Flu Cruz Devilla&B, Covid) Nasopharyngeal Swab     Status: None   Collection Time: 02/21/21  6:31 PM   Specimen: Nasopharyngeal Swab; Nasopharyngeal(NP) swabs in vial transport medium  Result Value Ref Range Status   SARS Coronavirus 2 by RT PCR NEGATIVE NEGATIVE Final    Comment: (NOTE) SARS-CoV-2 target nucleic acids are NOT DETECTED.  The SARS-CoV-2 RNA is generally detectable in upper respiratory specimens during the acute phase of infection. The lowest concentration of SARS-CoV-2 viral copies this assay can detect is 138 copies/mL. Tahirih Lair negative result does not preclude SARS-Cov-2 infection and should not be used as the sole basis for treatment or other patient management decisions. Okechukwu Regnier negative result may occur with  improper specimen collection/handling, submission  of specimen other than nasopharyngeal swab, presence of viral mutation(s) within the areas targeted by this assay, and inadequate number of viral copies(<138 copies/mL). Coltan Spinello negative result must be combined with clinical observations, patient history, and epidemiological information. The expected result is Negative.  Fact Sheet for Patients:  EntrepreneurPulse.com.au  Fact Sheet for Healthcare Providers:  IncredibleEmployment.be  This test is no t yet approved or cleared by the [place] FDA and  has been authorized for detection and/or diagnosis of SARS-CoV-2 by FDA under an Emergency Use Authorization (EUA). This EUA will remain  in effect (meaning this test can be used) for the duration of the COVID-19 declaration under Section 564(b)(1) of the Act, 21 U.S.C.section 360bbb-3(b)(1), unless the authorization is terminated  or revoked sooner.       Influenza Taelar Gronewold by PCR NEGATIVE NEGATIVE Final   Influenza B by PCR NEGATIVE NEGATIVE Final    Comment: (NOTE) The Xpert Xpress SARS-CoV-2/FLU/RSV plus assay is intended as an aid in the  diagnosis of influenza from Nasopharyngeal swab specimens and should not be used as Akacia Boltz sole basis for treatment. Nasal washings and aspirates are unacceptable for Xpert Xpress SARS-CoV-2/FLU/RSV testing.  Fact Sheet for Patients: EntrepreneurPulse.com.au  Fact Sheet for Healthcare Providers: IncredibleEmployment.be  This test is not yet approved or cleared by the [place] FDA and has been authorized for detection and/or diagnosis of SARS-CoV-2 by FDA under an Emergency Use Authorization (EUA). This EUA will remain in effect (meaning this test can be used) for the duration of the COVID-19 declaration under Section 564(b)(1) of the Act, 21 U.S.C. section 360bbb-3(b)(1), unless the authorization is terminated or revoked.  Performed at [facility], [place] [address], [place], [place] [address]          Radiology Studies: CT ABDOMEN PELVIS W CONTRAST  Result Date: 02/21/2021 CLINICAL DATA:  Acute abdominal pain. Patient reports right left hip pain. History of esophageal cancer with bone metastasis. EXAM: CT ABDOMEN AND PELVIS WITH CONTRAST TECHNIQUE: Multidetector CT imaging of the abdomen and pelvis was performed using the standard protocol following bolus administration of intravenous contrast. RADIATION DOSE REDUCTION: This exam was performed according to the departmental dose-optimization program which includes automated exposure control, adjustment of the mA and/or kV according to patient size and/or use of iterative reconstruction technique. CONTRAST:  141m OMNIPAQUE IOHEXOL 300 MG/ML  SOLN COMPARISON:  PET CT 9 days ago FINDINGS: Lower chest: Similar distal esophageal wall thickening to prior. No basilar airspace disease, pleural effusion, or nodule. The heart is normal in size. Hepatobiliary: 13 mm low-density lesion in the inferior right lobe of the liver, series 2, image 39. No other liver lesions. Small gallstones without  cholecystitis. No biliary dilatation. Pancreas: No ductal dilatation or inflammation. Spleen: Normal in size without focal abnormality. Splenules anterior and medially. Adrenals/Urinary Tract: Normal adrenal glands. No hydronephrosis or perinephric edema. Homogeneous renal enhancement with symmetric excretion on delayed phase imaging. Tiny hypodensity in the right kidney is too small to characterize but likely small cyst. No renal calculi. Urinary bladder is physiologically distended without wall thickening. Stomach/Bowel: Stomach physiologically distended. There is no small bowel obstruction or inflammation. Normal appendix. Moderate volume of colonic stool without colonic wall thickening. Sigmoid colon is redundant. Left sacral mass approaches the left aspect of the sigmoid colon, however no direct continuity, fat plane is demonstrated. Vascular/Lymphatic: Normal caliber abdominal aorta. Patent portal and splenic veins. Scattered small retroperitoneal nodes are not enlarged by size criteria. There is no pelvic adenopathy. Reproductive:  Prostate is unremarkable. Other: Presacral soft tissue edema, large sacral lesion is described below. Minimal fat in the inguinal canals. Small fat containing umbilical hernia. No ascites or free air. Musculoskeletal: Large sacral metastatic lesion is again seen. This measures at least 8.6 x 6.1 cm and is centered in the left sacrum, crosses the midline to involve the right aspect of the sacrum. There is extraosseous extension extending in the anterior presacral space. This lesion crosses the left sacroiliac joint and involves the iliac bone. No evidence of new or additional osseous lesions. IMPRESSION: 1. Large sacral metastatic lesion measuring at least 8.6 x 6.1 cm with extraosseous extension. This lesion approaches the left aspect of the sigmoid colon, however no direct continuity with the sigmoid colon is demonstrated. No significant change in recent PET CT. 2. Unchanged 13 mm  low-density lesion in the inferior right lobe of the liver, suspicious for metastatic disease. 3. Cholelithiasis without cholecystitis. 4. Similar distal esophageal wall thickening, known esophageal malignancy. Electronically Signed   By: Keith Rake M.D.   On: 02/21/2021 16:43   MR SACRUM SI JOINTS W WO CONTRAST  Result Date: 02/21/2021 CLINICAL DATA:  Esophageal cancer with metastatic disease. EXAM: MRI SACRUM WITH CONTRAST TECHNIQUE: Multiplanar multi-sequence MR imaging of the sacrum was performed. CONTRAST:  7 cc Gadavist COMPARISON:  CT earlier same day. FINDINGS: 8 x 10 mm centrally necrotic metastasis affecting the sacrum and surrounding soft tissues. This affects the S1, S2, S3 and upper S4 segments and is more extensive to the left of midline than the right. Tumor fills the sacral spinal canal and extends at least as far as the L5-S1 disc space. All sacral neural foramina are involved. Tumor extends into the presacral space and also extends dorsal to the sacrum to the left of midline. Pathologic fracture at the S2 segment with about 4 mm of offset. Tumor crosses the left sacroiliac joint and involves the left iliac bone. Tumor extends into the sacral hiatus on the left. IMPRESSION: Large metastasis affecting the sacrum with central necrosis. Extensive involvement of the S1, S2 and S3 segments with lesser involvement of upper S4. Lesion is more extensive to the left of midline but does involve all sacral foramina. Tumor fills the sacral spinal canal and extends up to the L5-S1 disc level. Tumor extends into the sacral hiatus on the left. Pathologic fracture at S2 with about 4 mm of offset. Crossing of the left SI joint with involvement of the iliac. Electronically Signed   By: Nelson Chimes M.D.   On: 02/21/2021 20:18        Scheduled Meds:  acetaminophen  1,000 mg Oral Q8H   Chlorhexidine Gluconate Cloth  6 each Topical Daily   dexamethasone (DECADRON) injection  4 mg Intravenous Q6H    enoxaparin (LOVENOX) injection  40 mg Subcutaneous R44R   folic acid  1 mg Oral Daily   gabapentin  300 mg Oral TID   insulin aspart  0-9 Units Subcutaneous TID WC   LORazepam  0-4 mg Oral Q6H   Followed by   Derrill Memo ON 02/23/2021] LORazepam  0-4 mg Oral Q12H   megestrol  20 mg Oral BID   morphine  30 mg Oral Q8H   multivitamin with minerals  1 tablet Oral Daily   pantoprazole  40 mg Oral BID AC   sodium chloride flush  10-40 mL Intracatheter Q12H   sucralfate  1 g Oral QID   thiamine  100 mg Oral Daily   Or  thiamine  100 mg Intravenous Daily   [START ON 03/02/2021] thiamine  100 mg Oral Daily   Continuous Infusions:  thiamine injection     Followed by   Derrill Memo ON 02/25/2021] thiamine injection       LOS: 0 days    Time spent: over 63 min    Fayrene Helper, MD [facility]   To contact the attending provider between 7A-7P or the covering provider during after hours 7P-7A, please log into the web site [email or web address] and access using universal [place] password for that web site. If you do not have the password, please call the hospital operator.  02/22/2021, 9:40 AM

## 2021-02-22 NOTE — Progress Notes (Addendum)
Jimmy Hawkins   DOB:July 18, 1975   JQ#:[id number]   QRF#:[id number]  Oncology follow up   Subjective: I saw pt and his wife in rad/onc CT simulation room today. I made an urgent referral to Dr. Lisbeth Renshaw and he will get a course of palliative RT. Pt states his pain is better controlled.    Objective:  Vitals:   02/22/21 1342 02/22/21 1741  BP: 112/74 111/76  Pulse: 83 87  Resp: 16 18  Temp: 98.3 F (36.8 C) 98.4 F (36.9 C)  SpO2: 99% 100%    Body mass index is 20.67 kg/m.  Intake/Output Summary (Last 24 hours) at 02/22/2021 1848 Last data filed at 02/22/2021 1129 Gross per 24 hour  Intake 970 ml  Output 2700 ml  Net -1730 ml     Sclerae unicteric  No leg edema   CBG (last 3)  Recent Labs    02/22/21 1343 02/22/21 1739  GLUCAP 127* 172*     Labs:  Urine Studies No results for input(s): UHGB, CRYS in the last 72 hours.  Invalid input(s): UACOL, UAPR, USPG, UPH, UTP, UGL, UKET, UBIL, UNIT, UROB, ULEU, UEPI, UWBC, URBC, UBAC, CAST, [place], [place]  Basic Metabolic Panel: Recent Labs  Lab 02/21/21 1409 02/21/21 1606 02/21/21 1802 02/22/21 0439  NA 131* 136  --  133*  K 3.3* 3.5  --  3.6  CL 101 103  --  102  CO2 25  --   --  23  GLUCOSE 105* 101*  --  172*  BUN 8 3*  --  8  CREATININE 0.48* 0.30* 0.35* 0.33*  CALCIUM 8.1*  --   --  8.7*   GFR Estimated Creatinine Clearance: 116.4 mL/min (A) (by C-G formula based on SCr of 0.33 mg/dL (L)). Liver Function Tests: Recent Labs  Lab 02/21/21 1409 02/22/21 0439  AST 14* 13*  ALT 15 16  ALKPHOS 58 59  BILITOT 0.5 0.6  PROT 6.2* 6.8  ALBUMIN 3.1* 3.1*   No results for input(s): LIPASE, AMYLASE in the last 168 hours. Recent Labs  Lab 02/22/21 1240  AMMONIA 23   Coagulation profile No results for input(s): INR, PROTIME in the last 168 hours.  CBC: Recent Labs  Lab 02/21/21 1409 02/21/21 1606 02/21/21 1802 02/22/21 0439  WBC 3.6*  --  2.6* 2.2*  NEUTROABS 2.8  --   --   --   HGB 8.7* 8.5*  8.9* 9.3*  HCT 26.4* 25.0* 27.7* 28.0*  MCV 97.4  --  97.9 97.2  PLT 169  --  168 173   Cardiac Enzymes: No results for input(s): CKTOTAL, CKMB, CKMBINDEX, TROPONINI in the last 168 hours. BNP: Invalid input(s): POCBNP CBG: Recent Labs  Lab 02/22/21 1343 02/22/21 1739  GLUCAP 127* 172*   D-Dimer No results for input(s): DDIMER in the last 72 hours. Hgb A1c Recent Labs    02/22/21 0439  HGBA1C 4.8   Lipid Profile No results for input(s): CHOL, HDL, LDLCALC, TRIG, CHOLHDL, LDLDIRECT in the last 72 hours. Thyroid function studies Recent Labs    02/22/21 1058  TSH 0.533   Anemia work up Recent Labs    02/22/21 [place] 8.5   Microbiology Recent Results (from the past 240 hour(s))  Resp Panel by RT-PCR (Flu A&B, Covid) Nasopharyngeal Swab     Status: None   Collection Time: 02/21/21  6:31 PM   Specimen: Nasopharyngeal Swab; Nasopharyngeal(NP) swabs in vial transport medium  Result Value Ref Range Status  SARS Coronavirus 2 by RT PCR NEGATIVE NEGATIVE Final    Comment: (NOTE) SARS-CoV-2 target nucleic acids are NOT DETECTED.  The SARS-CoV-2 RNA is generally detectable in upper respiratory specimens during the acute phase of infection. The lowest concentration of SARS-CoV-2 viral copies this assay can detect is 138 copies/mL. A negative result does not preclude SARS-Cov-2 infection and should not be used as the sole basis for treatment or other patient management decisions. A negative result may occur with  improper specimen collection/handling, submission of specimen other than nasopharyngeal swab, presence of viral mutation(s) within the areas targeted by this assay, and inadequate number of viral copies(<138 copies/mL). A negative result must be combined with clinical observations, patient history, and epidemiological information. The expected result is Negative.  Fact Sheet for Patients:   EntrepreneurPulse.com.au  Fact Sheet for Healthcare Providers:  IncredibleEmployment.be  This test is no t yet approved or cleared by the [place] FDA and  has been authorized for detection and/or diagnosis of SARS-CoV-2 by FDA under an Emergency Use Authorization (EUA). This EUA will remain  in effect (meaning this test can be used) for the duration of the COVID-19 declaration under Section 564(b)(1) of the Act, 21 U.S.C.section 360bbb-3(b)(1), unless the authorization is terminated  or revoked sooner.       Influenza A by PCR NEGATIVE NEGATIVE Final   Influenza B by PCR NEGATIVE NEGATIVE Final    Comment: (NOTE) The Xpert Xpress SARS-CoV-2/FLU/RSV plus assay is intended as an aid in the diagnosis of influenza from Nasopharyngeal swab specimens and should not be used as a sole basis for treatment. Nasal washings and aspirates are unacceptable for Xpert Xpress SARS-CoV-2/FLU/RSV testing.  Fact Sheet for Patients: EntrepreneurPulse.com.au  Fact Sheet for Healthcare Providers: IncredibleEmployment.be  This test is not yet approved or cleared by the [place] FDA and has been authorized for detection and/or diagnosis of SARS-CoV-2 by FDA under an Emergency Use Authorization (EUA). This EUA will remain in effect (meaning this test can be used) for the duration of the COVID-19 declaration under Section 564(b)(1) of the Act, 21 U.S.C. section 360bbb-3(b)(1), unless the authorization is terminated or revoked.  Performed at [facility], [place] [address], [place], [place] [address]       Studies:  CT ABDOMEN PELVIS W CONTRAST  Result Date: 02/21/2021 CLINICAL DATA:  Acute abdominal pain. Patient reports right left hip pain. History of esophageal cancer with bone metastasis. EXAM: CT ABDOMEN AND PELVIS WITH CONTRAST TECHNIQUE: Multidetector CT imaging of the abdomen and pelvis was  performed using the standard protocol following bolus administration of intravenous contrast. RADIATION DOSE REDUCTION: This exam was performed according to the departmental dose-optimization program which includes automated exposure control, adjustment of the mA and/or kV according to patient size and/or use of iterative reconstruction technique. CONTRAST:  147mL OMNIPAQUE IOHEXOL 300 MG/ML  SOLN COMPARISON:  PET CT 9 days ago FINDINGS: Lower chest: Similar distal esophageal wall thickening to prior. No basilar airspace disease, pleural effusion, or nodule. The heart is normal in size. Hepatobiliary: 13 mm low-density lesion in the inferior right lobe of the liver, series 2, image 39. No other liver lesions. Small gallstones without cholecystitis. No biliary dilatation. Pancreas: No ductal dilatation or inflammation. Spleen: Normal in size without focal abnormality. Splenules anterior and medially. Adrenals/Urinary Tract: Normal adrenal glands. No hydronephrosis or perinephric edema. Homogeneous renal enhancement with symmetric excretion on delayed phase imaging. Tiny hypodensity in the right kidney is too small to characterize but likely small  cyst. No renal calculi. Urinary bladder is physiologically distended without wall thickening. Stomach/Bowel: Stomach physiologically distended. There is no small bowel obstruction or inflammation. Normal appendix. Moderate volume of colonic stool without colonic wall thickening. Sigmoid colon is redundant. Left sacral mass approaches the left aspect of the sigmoid colon, however no direct continuity, fat plane is demonstrated. Vascular/Lymphatic: Normal caliber abdominal aorta. Patent portal and splenic veins. Scattered small retroperitoneal nodes are not enlarged by size criteria. There is no pelvic adenopathy. Reproductive: Prostate is unremarkable. Other: Presacral soft tissue edema, large sacral lesion is described below. Minimal fat in the inguinal canals. Small fat  containing umbilical hernia. No ascites or free air. Musculoskeletal: Large sacral metastatic lesion is again seen. This measures at least 8.6 x 6.1 cm and is centered in the left sacrum, crosses the midline to involve the right aspect of the sacrum. There is extraosseous extension extending in the anterior presacral space. This lesion crosses the left sacroiliac joint and involves the iliac bone. No evidence of new or additional osseous lesions. IMPRESSION: 1. Large sacral metastatic lesion measuring at least 8.6 x 6.1 cm with extraosseous extension. This lesion approaches the left aspect of the sigmoid colon, however no direct continuity with the sigmoid colon is demonstrated. No significant change in recent PET CT. 2. Unchanged 13 mm low-density lesion in the inferior right lobe of the liver, suspicious for metastatic disease. 3. Cholelithiasis without cholecystitis. 4. Similar distal esophageal wall thickening, known esophageal malignancy. Electronically Signed   By: Keith Rake M.D.   On: 02/21/2021 16:43   MR SACRUM SI JOINTS W WO CONTRAST  Result Date: 02/21/2021 CLINICAL DATA:  Esophageal cancer with metastatic disease. EXAM: MRI SACRUM WITH CONTRAST TECHNIQUE: Multiplanar multi-sequence MR imaging of the sacrum was performed. CONTRAST:  7 cc Gadavist COMPARISON:  CT earlier same day. FINDINGS: 8 x 10 mm centrally necrotic metastasis affecting the sacrum and surrounding soft tissues. This affects the S1, S2, S3 and upper S4 segments and is more extensive to the left of midline than the right. Tumor fills the sacral spinal canal and extends at least as far as the L5-S1 disc space. All sacral neural foramina are involved. Tumor extends into the presacral space and also extends dorsal to the sacrum to the left of midline. Pathologic fracture at the S2 segment with about 4 mm of offset. Tumor crosses the left sacroiliac joint and involves the left iliac bone. Tumor extends into the sacral hiatus on the  left. IMPRESSION: Large metastasis affecting the sacrum with central necrosis. Extensive involvement of the S1, S2 and S3 segments with lesser involvement of upper S4. Lesion is more extensive to the left of midline but does involve all sacral foramina. Tumor fills the sacral spinal canal and extends up to the L5-S1 disc level. Tumor extends into the sacral hiatus on the left. Pathologic fracture at S2 with about 4 mm of offset. Crossing of the left SI joint with involvement of the iliac. Electronically Signed   By: Nelson Chimes M.D.   On: 02/21/2021 20:18   EEG adult  Result Date: 02/22/2021 Lora Havens, MD     02/22/2021 12:14 PM Patient Name: Jimmy Hawkins MRN: [id number] Epilepsy Attending: Lora Havens Referring Physician/Provider: Elodia Florence., MD Date: 02/22/2021 Duration: 22.11 mins Patient history: 46 year old male with malignant neoplasm of esophagus, metastatic disease to bone who presented with altered mental status and jerking.  EEG to evaluate for seizure. Level of alertness: Awake AEDs during EEG  study: Ativan Technical aspects: This EEG study was done with scalp electrodes positioned according to the 10-20 International system of electrode placement. Electrical activity was acquired at a sampling rate of 500Hz  and reviewed with a high frequency filter of 70Hz  and a low frequency filter of 1Hz . EEG data were recorded continuously and digitally stored. Description: The posterior dominant rhythm consists of 9 Hz activity of moderate voltage (25-35 uV) seen predominantly in posterior head regions, symmetric and reactive to eye opening and eye closing. EEG also showed continuous generalized 3 to 6 Hz theta-delta slowing as well as 15 to 18 Hz beta activity in frontocentral region. Patient was noted to have multiple episodes of brief whole-body spontaneous, nonrhythmic jerking.  Concomitant EEG before, during and after the event did not show any EEG to suggest seizure.  Hyperventilation and photic stimulation were not performed.   ABNORMALITY - Continuous slow, generalized IMPRESSION: This study is suggestive of moderate diffuse encephalopathy, nonspecific etiology. Patient was noted to have multiple episodes of brief whole body spontaneous, nonrhythmic jerking without concomitant EEG change.  These episodes were Non-epileptic. No seizures or epileptiform discharges were seen throughout the recording. Lora Havens    Assessment: 46 y.o. 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  History of heavy alcohol user 5.  Altered mental status and jerking, possibly related to narcotics  6. DNR    Plan:  -Case discussed with radiation oncologist Dr. Lisbeth Renshaw this morning, patient received a course of palliative radiation to sacrum a year ago, due to his severe pain and neurological symptoms, Dr. Lisbeth Renshaw offered a second course of radiation for 14 days, he was simed today.  -please consult palliative care, for his pain management. I will likely transition his care to home hospice after he completes RT. I will f/u on Monday.    [name], MD 02/22/2021  6:48 PM

## 2021-02-22 NOTE — Assessment & Plan Note (Addendum)
Related to pain meds Improved with transition to oxycodone

## 2021-02-22 NOTE — Progress Notes (Signed)
OT Cancellation Note  Patient Details Name: Jimmy Hawkins MRN: [id number] DOB: 27-Jul-1975   Cancelled Treatment:    Reason Eval/Treat Not Completed: Patient at procedure or test/ unavailable. First attempt patient at radiation, second attempt patient fatigued and in pain requesting to rest. Will check back 1/28 as schedule permits.  Delbert Phenix OT OT pager: [phone] 02/22/2021, 1:46 PM

## 2021-02-22 NOTE — Progress Notes (Signed)
EEG complete - results pending 

## 2021-02-22 NOTE — Consult Note (Signed)
Radiation Oncology         ([phone]) [phone] ________________________________  Initial inpatient Consultation  Name: Jimmy Hawkins MRN: [id number]  Date of Service: 02/21/2021 DOB: 1975-12-01  YK:DXIP, Krista Blue, MD  No ref. provider found   REFERRING PHYSICIAN: No ref. provider found  DIAGNOSIS: 46 y.o. 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  HISTORY OF PRESENT ILLNESS: Jimmy Hawkins is a 47 y.o. male seen at the request of Dr. Burr Medico. He was initially diagnosed with esophageal cancer involving the upper and mid esophagus in July 2021. CT C/A/P on 08/11/19 revealed an 8 cm segment of thickening in the mid to lower esophagus with borderline AP window node and no evidence of distant metastatic disease. He had trace bilateral pleural effusions, cholelithiasis, and steatosis of the liver. Small bilateral indirect inguinal hernias were suspected as well. A biopsy was performed on 08/11/2019 and revealed squamous cell carcinoma. He completed chemoRT with weekly Carboplatin and Taxol 08/29/19-10/06/19 and was found to to be without disease at the conclusion of treatment by endoscopic evaluation due to progressive esophagitis confirmed by tissue sampling. A disease restaging PET in October 2021 showed hypermetabolic wall thickening of the distal half of the thoracic esophageus, no notable distant metastasis. He went on to receive consolidative CAPOX chemotherapy q3weeks with Xeloda 1500mg  BID 2 weeks on/1 week off from 12/20/19-02/2020. He was being seen by Dr. Laverta Baltimore at [facility] to consider esophagectomy on 03/20/20 but had complained of terrible left hip pain and he was sent for urgent MRI pelvis in the ED which showed a 4.9 cm mass in the left sacral ala with extension into the S1 sacral segment. It infiltrated with near complete replacement of the left S1 neural  foramen and narrowing also of the left S2 neural foramen resulting in edema of the left iliacus muscle and displacement of the left iliac vessels anteriorly. The left sacral nerve roots were also encased and soft tissue also involved the perirectal fat. He was admitted for pain management and saw [name] as an outpatient following discharge on 03/22/20.   He elected to proceed with a 2-week course of palliative radiation to the painful sacral lesion which was completed on 04/10/2020.  He did report improvement of his pain following the completion of radiation.  He started first-line single agent Keytruda for his metastatic disease on 04/27/2020 and due to his slow improvement in squamous cell histology, chemotherapy with carboplatin and Taxol was added from cycle 4 beginning 06/29/2020.  A restaging PET scan on 11/08/2020 showed worsening disease to the left sacral lesion and mild increased activity of the esophageal mass but no new metastases.   His systemic therapy was switched to docetaxel on 11/23/2020 and a restaging PET on 02/12/2021 showed increased activity and size of the left hemisacral lesion with stable mild metabolic activity in the primary esophageal mass and skeletal findings favored to reflect marrow stimulation with therapy.  The patient was complaining of worsening pain in the left hip/sacral area secondary to the progressive lesion in the left hemisacrum so his systemic therapy was changed to irinotecan every 2 weeks, with his first dose given 02/15/2021.  His pain medication was also changed to an increased dose of MS Contin to 60 mg every 12 hours and Dilaudid for breakthrough pain, to replace his hydrocodone.  Unfortunately, his pain has persisted and become intractable so he presented  to the emergency department on 02/21/2021 and has been admitted for pain control.  We have been asked to consult the patient for consideration of additional palliative radiotherapy to the progressive, painful metastatic  sacral lesion.  PREVIOUS RADIATION THERAPY: Yes   03/26/2020 - 04/10/2020: The painful metastatic lesion in the left hemi-sacrum was treated to 30 Gy in 10 fractions of 3 Gy each.  08/29/19 - 10/06/19: The esophagus was treated to a dose of 45 Gy in 25 fractions using an IMRT technique. The patient then received a boost for an additional 5.4 Gy to yield a total dose of 50.4 Gy. An SIB technique was also used to treat the tumor to a dose of 50 Gy/6Gy for a total of 56 Gy total.  PAST MEDICAL HISTORY:  Past Medical History:  Diagnosis Date   Blood transfusion without reported diagnosis    Cramps of left lower extremity 2021   Hematemesis with nausea 08/10/2019   Squamous cell carcinoma, esophagus ([place]) 04/2019   metastatic      PAST SURGICAL HISTORY: Past Surgical History:  Procedure Laterality Date   BIOPSY  08/11/2019   Procedure: BIOPSY;  Surgeon: [name], MD;  Location: [facility] ENDOSCOPY;  Service: Endoscopy;;   BIOPSY  11/28/2019   Procedure: BIOPSY;  Surgeon: [name], MD;  Location: [facility] ENDOSCOPY;  Service: Gastroenterology;;   ESOPHAGOGASTRODUODENOSCOPY  08/11/2019   ESOPHAGOGASTRODUODENOSCOPY (EGD) WITH PROPOFOL N/A 08/11/2019   Procedure: ESOPHAGOGASTRODUODENOSCOPY (EGD) WITH PROPOFOL;  Surgeon: [name], MD;  Location: [facility] ENDOSCOPY;  Service: Endoscopy;  Laterality: N/A;   ESOPHAGOGASTRODUODENOSCOPY (EGD) WITH PROPOFOL N/A 11/28/2019   Procedure: ESOPHAGOGASTRODUODENOSCOPY (EGD) WITH PROPOFOL;  Surgeon: Rush Landmark Telford Nab., MD;  Location: [place];  Service: Gastroenterology;  Laterality: N/A;   EUS N/A 11/28/2019   Procedure: UPPER ENDOSCOPIC ULTRASOUND (EUS) RADIAL;  Surgeon: [name], MD;  Location: [place];  Service: Gastroenterology;  Laterality: N/A;   IR IMAGING GUIDED PORT INSERTION  12/07/2020    FAMILY HISTORY:  Family History  Problem Relation Age of Onset   Esophageal cancer Mother    Stomach cancer Mother     Healthy Father    Colon cancer Neg Hx    Rectal cancer Neg Hx     SOCIAL HISTORY:  Social History   Socioeconomic History   Marital status: Single    Spouse name: Not on file   Number of children: Not on file   Years of education: Not on file   Highest education level: Not on file  Occupational History   Not on file  Tobacco Use   Smoking status: Never   Smokeless tobacco: Never  Vaping Use   Vaping Use: Never used  Substance and Sexual Activity   Alcohol use: Yes    Alcohol/week: 30.0 standard drinks    Types: 30 Cans of beer per week   Drug use: Never   Sexual activity: Not on file  Other Topics Concern   Not on file  Social History Narrative   Not on file   Social Determinants of Health   Financial Resource Strain: Not on file  Food Insecurity: Not on file  Transportation Needs: Not on file  Physical Activity: Not on file  Stress: Not on file  Social Connections: Not on file  Intimate Partner Violence: Not on file    ALLERGIES: Patient has no known allergies.  MEDICATIONS:  Current Facility-Administered Medications  Medication Dose Route Frequency Provider Last Rate Last Admin   acetaminophen (TYLENOL) tablet 1,000  mg  1,000 mg Oral Q8H Elodia Florence., MD   1,000 mg at 02/22/21 0505   Followed by   Derrill Memo ON 02/24/2021] acetaminophen (TYLENOL) tablet 650 mg  650 mg Oral Q6H PRN Elodia Florence., MD       diphenhydrAMINE (BENADRYL) capsule 50 mg  50 mg Oral QHS PRN Elodia Florence., MD       And   acetaminophen (TYLENOL) tablet 1,000 mg  1,000 mg Oral QHS PRN Elodia Florence., MD       Chlorhexidine Gluconate Cloth 2 % PADS 6 each  6 each Topical Daily Elodia Florence., MD       dexamethasone (DECADRON) injection 4 mg  4 mg Intravenous Q6H Elodia Florence., MD   4 mg at 02/22/21 0505   enoxaparin (LOVENOX) injection 40 mg  40 mg Subcutaneous Q24H Elodia Florence., MD   40 mg at 81/19/14 7829   folic acid (FOLVITE)  tablet 1 mg  1 mg Oral Daily Elodia Florence., MD   1 mg at 02/22/21 0932   gabapentin (NEURONTIN) capsule 300 mg  300 mg Oral TID Elodia Florence., MD   300 mg at 02/22/21 0931   HYDROmorphone (DILAUDID) injection 1 mg  1 mg Intravenous Q3H PRN Elodia Florence., MD       insulin aspart (novoLOG) injection 0-9 Units  0-9 Units Subcutaneous TID WC Elodia Florence., MD       LORazepam (ATIVAN) injection 0.5 mg  0.5 mg Intravenous PRN Elodia Florence., MD   0.5 mg at 02/21/21 1848   LORazepam (ATIVAN) tablet 1-4 mg  1-4 mg Oral Q1H PRN Elodia Florence., MD   1 mg at 02/22/21 0003   Or   LORazepam (ATIVAN) injection 1-4 mg  1-4 mg Intravenous Q1H PRN Elodia Florence., MD       LORazepam (ATIVAN) tablet 0-4 mg  0-4 mg Oral Q6H Elodia Florence., MD   1 mg at 02/22/21 5621   Followed by   Derrill Memo ON 02/23/2021] LORazepam (ATIVAN) tablet 0-4 mg  0-4 mg Oral Q12H Elodia Florence., MD       megestrol (MEGACE) tablet 20 mg  20 mg Oral BID Elodia Florence., MD   20 mg at 02/22/21 0935   morphine (MS CONTIN) 12 hr tablet 30 mg  30 mg Oral Q8H Elodia Florence., MD       multivitamin with minerals tablet 1 tablet  1 tablet Oral Daily Elodia Florence., MD   1 tablet at 02/22/21 0934   pantoprazole (PROTONIX) EC tablet 40 mg  40 mg Oral BID AC Elodia Florence., MD   40 mg at 02/22/21 0932   polyethylene glycol (MIRALAX / GLYCOLAX) packet 17 g  17 g Oral Daily PRN Elodia Florence., MD       prochlorperazine (COMPAZINE) tablet 10 mg  10 mg Oral Q6H PRN Elodia Florence., MD       sodium chloride flush (NS) 0.9 % injection 10-40 mL  10-40 mL Intracatheter Q12H Elodia Florence., MD   10 mL at 02/21/21 2236   sucralfate (CARAFATE) tablet 1 g  1 g Oral QID Elodia Florence., MD   1 g at 02/22/21 0931   thiamine 500mg  in normal saline (40ml) IVPB  500 mg Intravenous TID Florene Glen,  A Clint Lipps., MD       Followed by   Derrill Memo  ON 02/25/2021] thiamine (B-1) 250 mg in sodium chloride 0.9 % 50 mL IVPB  250 mg Intravenous Daily Elodia Florence., MD       Followed by   Derrill Memo ON 03/02/2021] thiamine tablet 100 mg  100 mg Oral Daily Elodia Florence., MD       thiamine tablet 100 mg  100 mg Oral Daily Elodia Florence., MD   100 mg at 02/22/21 8341   Or   thiamine (B-1) injection 100 mg  100 mg Intravenous Daily Elodia Florence., MD       Facility-Administered Medications Ordered in Other Encounters  Medication Dose Route Frequency Provider Last Rate Last Admin   diphenhydrAMINE (BENADRYL) 50 MG/ML injection            diphenhydrAMINE (BENADRYL) 50 MG/ML injection            famotidine (PEPCID) 20 mg IVPB            famotidine (PEPCID) 20 mg IVPB            palonosetron (ALOXI) 0.25 MG/5ML injection            palonosetron (ALOXI) 0.25 MG/5ML injection             REVIEW OF SYSTEMS:  On review of systems, obtained from his sister with the help of [place] via  [employer].  The patient has been having severe, progressive left hip pain for more than a month now despite a recent increase in his pain medication.  He denies any chest pain, shortness of breath, cough, fevers, chills, night sweats, or unintended weight changes.  He denies any bowel issues, abdominal pain, nausea or vomiting.  He has noticed more difficulty with voiding and also some weakness bilaterally in the lower extremities.  Aside from the pain in the left hip/sacrum, he denies any new musculoskeletal or joint aches or pains. A complete review of systems is obtained and is otherwise negative.   PHYSICAL EXAM:  Wt Readings from Last 3 Encounters:  02/21/21 152 lb 6 oz (69.1 kg)  02/18/21 152 lb 6 oz (69.1 kg)  02/15/21 151 lb (68.5 kg)   Temp Readings from Last 3 Encounters:  02/22/21 98 F (36.7 C) (Oral)  02/18/21 98.2 F (36.8 C) (Oral)  02/15/21 98.9 F (37.2 C) (Oral)   BP Readings from Last 3 Encounters:  02/22/21  103/64  02/18/21 91/70  02/18/21 93/69   Pulse Readings from Last 3 Encounters:  02/22/21 80  02/18/21 (!) 103  02/18/21 (!) 117   Pain Assessment Pain Score: 5 /10  In general this is a well appearing Hispanic male in no acute distress.  He is groggy and sedated but appropriate throughout the examination. Cardiopulmonary assessment is negative for acute distress and he exhibits normal effort.   KPS = 50  100 - Normal; no complaints; no evidence of disease. 90   - Able to carry on normal activity; minor signs or symptoms of disease. 80   - Normal activity with effort; some signs or symptoms of disease. 34   - Cares for self; unable to carry on normal activity or to do active work. 60   - Requires occasional assistance, but is able to care for most of his personal needs. 50   - Requires considerable assistance and frequent medical care. 73   - Disabled; requires special  care and assistance. 38   - Severely disabled; hospital admission is indicated although death not imminent. 3   - Very sick; hospital admission necessary; active supportive treatment necessary. 10   - Moribund; fatal processes progressing rapidly. 0     - Dead  Karnofsky DA, Abelmann [place], Craver LS and Burchenal [facility] [phone]) The use of the nitrogen mustards in the palliative treatment of carcinoma: with particular reference to bronchogenic carcinoma Cancer 1 634-56  LABORATORY DATA:  Lab Results  Component Value Date   WBC 2.2 (L) 02/22/2021   HGB 9.3 (L) 02/22/2021   HCT 28.0 (L) 02/22/2021   MCV 97.2 02/22/2021   PLT 173 02/22/2021   Lab Results  Component Value Date   NA 133 (L) 02/22/2021   K 3.6 02/22/2021   CL 102 02/22/2021   CO2 23 02/22/2021   Lab Results  Component Value Date   ALT 16 02/22/2021   AST 13 (L) 02/22/2021   ALKPHOS 59 02/22/2021   BILITOT 0.6 02/22/2021     RADIOGRAPHY: CT ABDOMEN PELVIS W CONTRAST  Result Date: 02/21/2021 CLINICAL DATA:  Acute abdominal pain. Patient reports  right left hip pain. History of esophageal cancer with bone metastasis. EXAM: CT ABDOMEN AND PELVIS WITH CONTRAST TECHNIQUE: Multidetector CT imaging of the abdomen and pelvis was performed using the standard protocol following bolus administration of intravenous contrast. RADIATION DOSE REDUCTION: This exam was performed according to the departmental dose-optimization program which includes automated exposure control, adjustment of the mA and/or kV according to patient size and/or use of iterative reconstruction technique. CONTRAST:  13mL OMNIPAQUE IOHEXOL 300 MG/ML  SOLN COMPARISON:  PET CT 9 days ago FINDINGS: Lower chest: Similar distal esophageal wall thickening to prior. No basilar airspace disease, pleural effusion, or nodule. The heart is normal in size. Hepatobiliary: 13 mm low-density lesion in the inferior right lobe of the liver, series 2, image 39. No other liver lesions. Small gallstones without cholecystitis. No biliary dilatation. Pancreas: No ductal dilatation or inflammation. Spleen: Normal in size without focal abnormality. Splenules anterior and medially. Adrenals/Urinary Tract: Normal adrenal glands. No hydronephrosis or perinephric edema. Homogeneous renal enhancement with symmetric excretion on delayed phase imaging. Tiny hypodensity in the right kidney is too small to characterize but likely small cyst. No renal calculi. Urinary bladder is physiologically distended without wall thickening. Stomach/Bowel: Stomach physiologically distended. There is no small bowel obstruction or inflammation. Normal appendix. Moderate volume of colonic stool without colonic wall thickening. Sigmoid colon is redundant. Left sacral mass approaches the left aspect of the sigmoid colon, however no direct continuity, fat plane is demonstrated. Vascular/Lymphatic: Normal caliber abdominal aorta. Patent portal and splenic veins. Scattered small retroperitoneal nodes are not enlarged by size criteria. There is no  pelvic adenopathy. Reproductive: Prostate is unremarkable. Other: Presacral soft tissue edema, large sacral lesion is described below. Minimal fat in the inguinal canals. Small fat containing umbilical hernia. No ascites or free air. Musculoskeletal: Large sacral metastatic lesion is again seen. This measures at least 8.6 x 6.1 cm and is centered in the left sacrum, crosses the midline to involve the right aspect of the sacrum. There is extraosseous extension extending in the anterior presacral space. This lesion crosses the left sacroiliac joint and involves the iliac bone. No evidence of new or additional osseous lesions. IMPRESSION: 1. Large sacral metastatic lesion measuring at least 8.6 x 6.1 cm with extraosseous extension. This lesion approaches the left aspect of the sigmoid colon, however no direct continuity with the  sigmoid colon is demonstrated. No significant change in recent PET CT. 2. Unchanged 13 mm low-density lesion in the inferior right lobe of the liver, suspicious for metastatic disease. 3. Cholelithiasis without cholecystitis. 4. Similar distal esophageal wall thickening, known esophageal malignancy. Electronically Signed   By: Keith Rake M.D.   On: 02/21/2021 16:43   MR SACRUM SI JOINTS W WO CONTRAST  Result Date: 02/21/2021 CLINICAL DATA:  Esophageal cancer with metastatic disease. EXAM: MRI SACRUM WITH CONTRAST TECHNIQUE: Multiplanar multi-sequence MR imaging of the sacrum was performed. CONTRAST:  7 cc Gadavist COMPARISON:  CT earlier same day. FINDINGS: 8 x 10 mm centrally necrotic metastasis affecting the sacrum and surrounding soft tissues. This affects the S1, S2, S3 and upper S4 segments and is more extensive to the left of midline than the right. Tumor fills the sacral spinal canal and extends at least as far as the L5-S1 disc space. All sacral neural foramina are involved. Tumor extends into the presacral space and also extends dorsal to the sacrum to the left of midline.  Pathologic fracture at the S2 segment with about 4 mm of offset. Tumor crosses the left sacroiliac joint and involves the left iliac bone. Tumor extends into the sacral hiatus on the left. IMPRESSION: Large metastasis affecting the sacrum with central necrosis. Extensive involvement of the S1, S2 and S3 segments with lesser involvement of upper S4. Lesion is more extensive to the left of midline but does involve all sacral foramina. Tumor fills the sacral spinal canal and extends up to the L5-S1 disc level. Tumor extends into the sacral hiatus on the left. Pathologic fracture at S2 with about 4 mm of offset. Crossing of the left SI joint with involvement of the iliac. Electronically Signed   By: Nelson Chimes M.D.   On: 02/21/2021 20:18   NM PET Image Restag (PS) Skull Base To Thigh  Addendum Date: 02/21/2021   ADDENDUM REPORT: 02/21/2021 17:11 ADDENDUM: Upon re-review of PET-CT performed February 12, 2021, the following findings are noted: Within the inferior right lobe of the liver there is a subtle hypodense 9 mm lesion on image 126/4 which demonstrates hypermetabolic activity with a max SUV of 3.93, suspicious for metastatic disease. These results will be called to the ordering clinician or representative by the Radiologist Assistant, and communication documented in the PACS or [employer]. Electronically Signed   By: Dahlia Bailiff M.D.   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 EXAM: NUCLEAR MEDICINE PET SKULL BASE TO THIGH TECHNIQUE: 8.15 mCi F-18 FDG was injected intravenously. Full-ring PET imaging was performed from the skull base to thigh after the radiotracer. CT data was obtained and used for attenuation correction and anatomic localization. Fasting blood glucose: 103 mg/dl COMPARISON:  Multiple priors including most recent PET-CT November 08, 2020 FINDINGS:  Mediastinal blood pool activity: SUV max 1.42 Liver activity: SUV max NA NECK: No hypermetabolic lymph nodes in the neck. Incidental CT findings: none CHEST: Similar chronic wall thickening involving the mid to distal esophagus with max SUV of 3.85 previously 6.2. Also similar prior is indistinctness of the tissue planes adjacent to the affected segment of the esophagus. No hypermetabolic thoracic adenopathy identified. Incidental CT findings: Stable small pericardial effusion. Right chest Port-A-Cath with tip in the right atrium. ABDOMEN/PELVIS: No abnormal hypermetabolic activity within the liver, pancreas, adrenal glands, or spleen. No hypermetabolic lymph nodes in the  abdomen or pelvis. Incidental CT findings: Cholelithiasis without evidence of acute cholecystitis. Unremarkable noncontrast appearance of the hepatic, pancreatic and splenic parenchyma. No hydronephrosis. No evidence of bowel obstruction. Normal appendix. Mild circumferential wall thickening of an incompletely distended urinary bladder appears similar prior. SKELETON: Increase in size of the large hypermetabolic centrally necrotic osseous metastatic lesion centered in the left hemi sacrum with erosion through the left SI joint and increased erosion into the sacrum and sacral spinal canal measuring 9.7 x 7.8 cm with a max SUV of 9.63 previously 7.7 x 6.4 cm with a max SUV of 9.8. Post radiation change in the mid/lower thoracic spine. Extensive hypermetabolic marrow activity involving the axial and proximal appendicular skeleton without new/additional suspicious osseous lesion, favored to reflect marrow stimulation. Incidental CT findings: none IMPRESSION: 1. Increase in size of the peripherally hypermetabolic centrally necrotic metastatic lesion centered in the left hemisacrum. 2. Similar mild metabolic activity associated with the thickened segment of mid to distal esophagus which corresponds with the original mass and is favored to reflect  posttreatment change. 3. Extensive hypermetabolic marrow activity involving the axial and proximal appendicular skeleton without new/additional suspicious osseous lesion, favored to reflect marrow stimulation. Electronically Signed: By: Dahlia Bailiff M.D. On: 02/13/2021 09:16   VAS [name] LOWER EXTREMITY VENOUS (DVT)  Result Date: 01/25/2021  Lower Venous DVT Study Patient Name:  CORDELRO GAUTREAU [facility]  Date of Exam:   01/25/2021 Medical Rec #: [id number]                  Accession #:    [id number] Date of Birth: 1975-09-10                   Patient Gender: M Patient Age:   44 years Exam Location:  [facility] Procedure:      VAS [name] LOWER EXTREMITY VENOUS (DVT) Referring Phys: [name] --------------------------------------------------------------------------------  Indications: Edema.  Risk Factors: Cancer. Limitations: Poor ultrasound/tissue interface. Comparison Study: No prior studies. Performing Technologist: Oliver Hum RVT  Examination Guidelines: A complete evaluation includes B-mode imaging, spectral Doppler, color Doppler, and power Doppler as needed of all accessible portions of each vessel. Bilateral testing is considered an integral part of a complete examination. Limited examinations for reoccurring indications may be performed as noted. The reflux portion of the exam is performed with the patient in reverse Trendelenburg.  +-----+---------------+---------+-----------+----------+--------------+  RIGHT Compressibility Phasicity Spontaneity Properties Thrombus Aging  +-----+---------------+---------+-----------+----------+--------------+  CFV   Full            Yes       Yes                                    +-----+---------------+---------+-----------+----------+--------------+   +---------+---------------+---------+-----------+----------+--------------+  LEFT      Compressibility Phasicity Spontaneity Properties Thrombus Aging   +---------+---------------+---------+-----------+----------+--------------+  CFV       Full            Yes       Yes                                    +---------+---------------+---------+-----------+----------+--------------+  SFJ       Full                                                             +---------+---------------+---------+-----------+----------+--------------+  FV Prox   Full                                                             +---------+---------------+---------+-----------+----------+--------------+  FV Mid    Full                                                             +---------+---------------+---------+-----------+----------+--------------+  FV Distal Full                                                             +---------+---------------+---------+-----------+----------+--------------+  PFV       Full                                                             +---------+---------------+---------+-----------+----------+--------------+  POP       Full            Yes       Yes                                    +---------+---------------+---------+-----------+----------+--------------+  PTV       Full                                                             +---------+---------------+---------+-----------+----------+--------------+  PERO      Full                                                             +---------+---------------+---------+-----------+----------+--------------+     Summary: RIGHT: - No evidence of common femoral vein obstruction.  LEFT: - There is no evidence of deep vein thrombosis in the lower extremity. However, portions of this examination were limited- see technologist comments above.  - No cystic structure found in the popliteal fossa.  *See table(s) above for measurements and observations. Electronically signed by Monica Martinez MD on 01/25/2021 at 4:29:10 PM.    Final       IMPRESSION/PLAN: 1. 46 y.o. 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 Dr. Lisbeth Renshaw and I have personally reviewed the patient's most recent imaging which reveals significant progression of the previously treated  left sacral lesion.  Dr. Lisbeth Renshaw recommends a 14 fraction course of palliative radiotherapy for re-irradiation of this lesion in an effort to improve his pain and preserve neurologic function.   Today, with the help of [employer] via WebEx, I talked to the patient and family about the findings and workup thus far. We discussed the natural history of metastatic squamous cell carcinoma of the esophagus and general treatment, highlighting the role of radiotherapy in the management of painful bony disease. We discussed the available radiation techniques, and focused on the details and logistics of delivery which he is familiar with from previous radiation treatments. We reviewed the anticipated acute and late sequelae associated with re-irradiation in this setting. The patient and his sister were encouraged to ask questions that were answered to their stated satisfaction.  They are in agreement to proceed with the recommended 3-week course of daily, palliative radiotherapy to the progressive, painful lesion in the left hemisacrum.  His sister, acting as his delicate due to his sedation, has freely signed written consent to proceed today in the office and a copy of this document will be placed in his medical record.  We will proceed with CT simulation/treatment planning today, in anticipation of beginning his daily radiation treatments on Tuesday, 02/26/2021.  They understand that if his hospital medical team deems him stable for discharge home prior to the completion of the course of radiation, we can certainly continue his daily radiation treatments on an outpatient basis.  They appear to have a good understanding of his disease and our  recommendations which are of palliative intent, and they are comfortable and in agreement with the stated plan.  We will share our discussion with his medical team and proceed with treatment accordingly.   I personally spent 70 minutes in this encounter including chart review, reviewing radiological studies, meeting face-to-face with the patient and family, coordinating care, entering orders and completing documentation.    Nicholos Johns, PA-C   Jodelle Gross, MD, PhD   [facility]   Radiation Oncology Direct Dial: [phone]   Fax: [phone] [email or web address]   Skype   LinkedIn

## 2021-02-22 NOTE — Assessment & Plan Note (Addendum)
a1c 4.8 CBG stable 120 range

## 2021-02-22 NOTE — Plan of Care (Signed)
  Problem: Education: Goal: Knowledge of General Education information will improve Description Including pain rating scale, medication(s)/side effects and non-pharmacologic comfort measures Outcome: Progressing   

## 2021-02-22 NOTE — Assessment & Plan Note (Addendum)
Suspect related to pain meds, vs alcohol use.  Ammonia wnl, b12 elevated, folate wnl, VBG without hypercarbia EEG with moderate diffuse encephalopathy, nonspecific etiology - non epileptic jerking episodes - no seizures or epileptiform discharges. Received High dose  thiamine given hx etoh abuse Resolved.

## 2021-02-22 NOTE — Procedures (Signed)
Patient Name: Jimmy Hawkins  MRN: [id number]  Epilepsy Attending: Lora Havens  Referring Physician/Provider: Elodia Florence., MD Date: 02/22/2021  Duration: 22.11 mins  Patient history: 46 year old male with malignant neoplasm of esophagus, metastatic disease to bone who presented with altered mental status and jerking.  EEG to evaluate for seizure.  Level of alertness: Awake  AEDs during EEG study: Ativan   Technical aspects: This EEG study was done with scalp electrodes positioned according to the 10-20 International system of electrode placement. Electrical activity was acquired at a sampling rate of 500Hz  and reviewed with a high frequency filter of 70Hz  and a low frequency filter of 1Hz . EEG data were recorded continuously and digitally stored.   Description: The posterior dominant rhythm consists of 9 Hz activity of moderate voltage (25-35 uV) seen predominantly in posterior head regions, symmetric and reactive to eye opening and eye closing. EEG also showed continuous generalized 3 to 6 Hz theta-delta slowing as well as 15 to 18 Hz beta activity in frontocentral region.  Patient was noted to have multiple episodes of brief whole-body spontaneous, nonrhythmic jerking.  Concomitant EEG before, during and after the event did not show any EEG to suggest seizure.  Hyperventilation and photic stimulation were not performed.     ABNORMALITY - Continuous slow, generalized  IMPRESSION: This study is suggestive of moderate diffuse encephalopathy, nonspecific etiology.   Patient was noted to have multiple episodes of brief whole body spontaneous, nonrhythmic jerking without concomitant EEG change.  These episodes were Non-epileptic.   No seizures or epileptiform discharges were seen throughout the recording.  Abhiram Criado Barbra Sarks

## 2021-02-22 NOTE — Progress Notes (Signed)
Per RN, patient is unavailable for EEG.  He has gone for radiation Tx.  Will re-attempt.

## 2021-02-22 NOTE — Progress Notes (Signed)
PT Cancellation Note  Patient Details Name: Jimmy Hawkins MRN: [id number] DOB: May 18, 1975   Cancelled Treatment:     PT order received but eval deferred this am.  Pt in radiation.  Will follow.   Deakon Frix 02/22/2021, 10:59 AM

## 2021-02-22 NOTE — Progress Notes (Signed)
PT Cancellation Note  Patient Details Name: Jimmy Hawkins MRN: [id number] DOB: 1975-04-05   Cancelled Treatment:     PT eval re-attempted but deferred at request of pt 2* pain and fatigue following radiation.  Pt receiving IV pain meds on arrival.  Will follow.   Zi Sek 02/22/2021, 2:44 PM

## 2021-02-23 DIAGNOSIS — Z515 Encounter for palliative care: Secondary | ICD-10-CM

## 2021-02-23 DIAGNOSIS — C7951 Secondary malignant neoplasm of bone: Principal | ICD-10-CM

## 2021-02-23 DIAGNOSIS — G893 Neoplasm related pain (acute) (chronic): Secondary | ICD-10-CM

## 2021-02-23 DIAGNOSIS — F1010 Alcohol abuse, uncomplicated: Secondary | ICD-10-CM

## 2021-02-23 LAB — CBC WITH DIFFERENTIAL/PLATELET
Abs Immature Granulocytes: 0.04 10*3/uL (ref 0.00–0.07)
Basophils Absolute: 0 10*3/uL (ref 0.0–0.1)
Basophils Relative: 0 %
Eosinophils Absolute: 0 10*3/uL (ref 0.0–0.5)
Eosinophils Relative: 0 %
HCT: 26.4 % — ABNORMAL LOW (ref 39.0–52.0)
Hemoglobin: 8.6 g/dL — ABNORMAL LOW (ref 13.0–17.0)
Immature Granulocytes: 1 %
Lymphocytes Relative: 5 %
Lymphs Abs: 0.2 10*3/uL — ABNORMAL LOW (ref 0.7–4.0)
MCH: 31.6 pg (ref 26.0–34.0)
MCHC: 32.6 g/dL (ref 30.0–36.0)
MCV: 97.1 fL (ref 80.0–100.0)
Monocytes Absolute: 0.2 10*3/uL (ref 0.1–1.0)
Monocytes Relative: 5 %
Neutro Abs: 3.2 10*3/uL (ref 1.7–7.7)
Neutrophils Relative %: 89 %
Platelets: 174 10*3/uL (ref 150–400)
RBC: 2.72 MIL/uL — ABNORMAL LOW (ref 4.22–5.81)
RDW: 14.6 % (ref 11.5–15.5)
WBC: 3.5 10*3/uL — ABNORMAL LOW (ref 4.0–10.5)
nRBC: 0 % (ref 0.0–0.2)

## 2021-02-23 LAB — COMPREHENSIVE METABOLIC PANEL
ALT: 16 U/L (ref 0–44)
AST: 15 U/L (ref 15–41)
Albumin: 3.1 g/dL — ABNORMAL LOW (ref 3.5–5.0)
Alkaline Phosphatase: 56 U/L (ref 38–126)
Anion gap: 6 (ref 5–15)
BUN: 9 mg/dL (ref 6–20)
CO2: 25 mmol/L (ref 22–32)
Calcium: 8.8 mg/dL — ABNORMAL LOW (ref 8.9–10.3)
Chloride: 103 mmol/L (ref 98–111)
Creatinine, Ser: <0.3 mg/dL — ABNORMAL LOW (ref 0.61–1.24)
Glucose, Bld: 137 mg/dL — ABNORMAL HIGH (ref 70–99)
Potassium: 3.5 mmol/L (ref 3.5–5.1)
Sodium: 134 mmol/L — ABNORMAL LOW (ref 135–145)
Total Bilirubin: 0.5 mg/dL (ref 0.3–1.2)
Total Protein: 6.6 g/dL (ref 6.5–8.1)

## 2021-02-23 LAB — IRON AND TIBC
Iron: 43 ug/dL — ABNORMAL LOW (ref 45–182)
Saturation Ratios: 16 % — ABNORMAL LOW (ref 17.9–39.5)
TIBC: 263 ug/dL (ref 250–450)
UIBC: 220 ug/dL

## 2021-02-23 LAB — GLUCOSE, CAPILLARY
Glucose-Capillary: 142 mg/dL — ABNORMAL HIGH (ref 70–99)
Glucose-Capillary: 150 mg/dL — ABNORMAL HIGH (ref 70–99)
Glucose-Capillary: 155 mg/dL — ABNORMAL HIGH (ref 70–99)

## 2021-02-23 LAB — PHOSPHORUS: Phosphorus: 3.5 mg/dL (ref 2.5–4.6)

## 2021-02-23 LAB — FERRITIN: Ferritin: 496 ng/mL — ABNORMAL HIGH (ref 24–336)

## 2021-02-23 LAB — MAGNESIUM: Magnesium: 2 mg/dL (ref 1.7–2.4)

## 2021-02-23 MED ORDER — TRAZODONE HCL 50 MG PO TABS
50.0000 mg | ORAL_TABLET | Freq: Every day | ORAL | Status: DC
  Administered 2021-02-23 – 2021-03-09 (×15): 50 mg via ORAL
  Filled 2021-02-23 (×15): qty 1

## 2021-02-23 MED ORDER — HYDROMORPHONE HCL 1 MG/ML IJ SOLN
1.0000 mg | INTRAMUSCULAR | Status: AC
  Filled 2021-02-23: qty 1

## 2021-02-23 MED ORDER — OXYCODONE HCL ER 20 MG PO T12A
20.0000 mg | EXTENDED_RELEASE_TABLET | Freq: Three times a day (TID) | ORAL | Status: DC
  Administered 2021-02-23 – 2021-03-07 (×36): 20 mg via ORAL
  Filled 2021-02-23 (×36): qty 1

## 2021-02-23 NOTE — ED Provider Notes (Signed)
[facility] Provider Note   CSN: [id number] Arrival date & time: 02/21/21  1236     History  No chief complaint on file.   Jimmy Hawkins is a 46 y.o. male.  Patient with a history of metastatic cancer.  Patient presents with right hip and right lower quadrant abdominal pain.  The history is provided by the patient and medical records. No language interpreter was used.  Abdominal Pain Pain location:  RLQ Pain quality: aching   Pain radiates to:  Does not radiate Pain severity:  Severe Onset quality:  Gradual Timing:  Constant Progression:  Worsening Chronicity:  Recurrent Context: not alcohol use   Relieved by:  Nothing Worsened by:  Nothing Associated symptoms: no chest pain, no cough, no diarrhea, no fatigue and no hematuria       Home Medications Prior to Admission medications   Medication Sig Start Date End Date Taking? Authorizing Provider  dexamethasone (DECADRON) 4 MG tablet Take 1 tablet by mouth once a day WITH FOOD Patient taking differently: Take 4 mg by mouth daily. 02/10/21  Yes   diphenhydrAMINE-APAP, sleep, (TYLENOL PM EXTRA STRENGTH) 50-1000 MG/30ML LIQD Take 15 mLs by mouth at bedtime as needed (sleep).   Yes [provider]  ferrous sulfate 325 (65 FE) MG tablet TAKE 1 TABLET BY MOUTH 2 TIMES DAILY. Patient taking differently: Take 325 mg by mouth 2 (two) times daily. 05/18/20 05/18/21 Yes [name], MD  gabapentin (NEURONTIN) 300 MG capsule Take 1 capsule (300 mg total) by mouth 3 (three) times daily. 12/28/20  Yes Pickenpack-Cousar, Carlena Sax, NP  HYDROcodone-acetaminophen (HYCET) 7.5-325 mg/15 ml solution Take 10 mLs by mouth every 6  hours as needed for moderate pain. OK to refill on 02/05/2021 02/01/21 07/31/21 Yes [name], MD  HYDROmorphone (DILAUDID) 2 MG tablet Take 1 tablet (2 mg total) by mouth every 4 (four) hours as needed for severe pain. 02/15/21  Yes [name], MD  megestrol (MEGACE) 20 MG tablet Take 1 tablet  by mouth 2 times daily. 01/25/21  Yes [name], MD  morphine (MS CONTIN) 60 MG 12 hr tablet Take 1 tablet (60 mg total) by mouth every 12 (twelve) hours. 02/15/21  Yes [name], MD  pantoprazole (PROTONIX) 40 MG tablet Take 1 tablet (40 mg total) by mouth 2 (two) times daily before a meal. 10/12/20  Yes [name], MD  prochlorperazine (COMPAZINE) 10 MG tablet Take 1 tablet by mouth every 6 hours as needed for nausea or vomiting. 01/25/21 01/25/22 Yes [name], MD  senna-docusate (SENOKOT S) 8.6-50 MG tablet Take 2 tablet by mouth once a day Patient taking differently: Take 1 tablet by mouth daily. 02/10/21  Yes   sucralfate (CARAFATE) 1 g tablet Take 1 tablet by mouth 4 times daily. (Dissolve in 15 mls of water before taking) 09/06/20  Yes Levin Erp, PA  famotidine (PEPCID) 20 MG tablet TOME 1 PASTILLA POR VIA ORAL DOS VECES AL DIA 12/20/19 12/19/20  [name], MD  Ferrous Sulfate (IRON) 325 (65 Fe) MG TABS Take 1 tablet (325 mg total) by mouth 2 (two) times daily. 07/20/20 10/03/20  [name], MD  omeprazole (PRILOSEC) 40 MG capsule Take 1 capsule (40 mg total) by mouth daily. 09/12/19 11/28/19  [name], MD      Allergies    Patient has no known allergies.    Review of Systems   Review of Systems  Constitutional:  Negative for appetite change and fatigue.  HENT:  Negative for congestion, ear discharge and sinus pressure.   Eyes:  Negative for discharge.  Respiratory:  Negative for cough.   Cardiovascular:  Negative for chest pain.  Gastrointestinal:  Positive for abdominal pain. Negative for diarrhea.  Genitourinary:  Negative for frequency and hematuria.  Musculoskeletal:  Negative for back pain.  Skin:  Negative for rash.  Neurological:  Negative for seizures and headaches.  Psychiatric/Behavioral:  Negative for hallucinations.    Physical Exam Updated Vital Signs BP 106/74 (BP Location: Left Arm)    Pulse 71    Temp 98.1 F (36.7 C) (Oral)    Resp 18    Ht 6' (1.829 m)     Wt 69.1 kg    SpO2 100%    BMI 20.67 kg/m  Physical Exam Vitals and nursing note reviewed.  Constitutional:      Appearance: He is well-developed.  HENT:     Head: Normocephalic.     Mouth/Throat:     Mouth: Mucous membranes are moist.  Eyes:     General: No scleral icterus.    Conjunctiva/sclera: Conjunctivae normal.  Neck:     Thyroid: No thyromegaly.  Cardiovascular:     Rate and Rhythm: Normal rate and regular rhythm.     Heart sounds: No murmur heard.   No friction rub. No gallop.  Pulmonary:     Breath sounds: No stridor. No wheezing or rales.  Chest:     Chest wall: No tenderness.  Abdominal:     General: There is no distension.     Tenderness: There is abdominal tenderness. There is no rebound.  Musculoskeletal:        General: Normal range of motion.     Cervical back: Neck supple.  Lymphadenopathy:     Cervical: No cervical adenopathy.  Skin:    Findings: No erythema or rash.  Neurological:     Mental Status: He is alert and oriented to person, place, and time.     Motor: No abnormal muscle tone.     Coordination: Coordination normal.  Psychiatric:        Behavior: Behavior normal.    ED Results / Procedures / Treatments   Labs (all labs ordered are listed, but only abnormal results are displayed) Labs Reviewed  CBC WITH DIFFERENTIAL/PLATELET - Abnormal; Notable for the following components:      Result Value   WBC 3.6 (*)    RBC 2.71 (*)    Hemoglobin 8.7 (*)    HCT 26.4 (*)    Lymphs Abs 0.2 (*)    All other components within normal limits  COMPREHENSIVE METABOLIC PANEL - Abnormal; Notable for the following components:   Sodium 131 (*)    Potassium 3.3 (*)    Glucose, Bld 105 (*)    Creatinine, Ser 0.48 (*)    Calcium 8.1 (*)    Total Protein 6.2 (*)    Albumin 3.1 (*)    AST 14 (*)    All other components within normal limits  CBC - Abnormal; Notable for the following components:   WBC 2.6 (*)    RBC 2.83 (*)    Hemoglobin 8.9 (*)    HCT  27.7 (*)    All other components within normal limits  CREATININE, SERUM - Abnormal; Notable for the following components:   Creatinine, Ser 0.35 (*)    All other components within normal limits  COMPREHENSIVE METABOLIC PANEL - Abnormal; Notable for the following components:   Sodium 133 (*)  Glucose, Bld 172 (*)    Creatinine, Ser 0.33 (*)    Calcium 8.7 (*)    Albumin 3.1 (*)    AST 13 (*)    All other components within normal limits  CBC - Abnormal; Notable for the following components:   WBC 2.2 (*)    RBC 2.88 (*)    Hemoglobin 9.3 (*)    HCT 28.0 (*)    All other components within normal limits  VITAMIN B12 - Abnormal; Notable for the following components:   Vitamin B-12 1,753 (*)    All other components within normal limits  BLOOD GAS, VENOUS - Abnormal; Notable for the following components:   pH, Ven 7.466 (*)    pCO2, Ven 33.5 (*)    All other components within normal limits  GLUCOSE, CAPILLARY - Abnormal; Notable for the following components:   Glucose-Capillary 127 (*)    All other components within normal limits  IRON AND TIBC - Abnormal; Notable for the following components:   Iron 43 (*)    Saturation Ratios 16 (*)    All other components within normal limits  FERRITIN - Abnormal; Notable for the following components:   Ferritin 496 (*)    All other components within normal limits  CBC WITH DIFFERENTIAL/PLATELET - Abnormal; Notable for the following components:   WBC 3.5 (*)    RBC 2.72 (*)    Hemoglobin 8.6 (*)    HCT 26.4 (*)    Lymphs Abs 0.2 (*)    All other components within normal limits  COMPREHENSIVE METABOLIC PANEL - Abnormal; Notable for the following components:   Sodium 134 (*)    Glucose, Bld 137 (*)    Creatinine, Ser <0.30 (*)    Calcium 8.8 (*)    Albumin 3.1 (*)    All other components within normal limits  GLUCOSE, CAPILLARY - Abnormal; Notable for the following components:   Glucose-Capillary 172 (*)    All other components within  normal limits  GLUCOSE, CAPILLARY - Abnormal; Notable for the following components:   Glucose-Capillary 142 (*)    All other components within normal limits  GLUCOSE, CAPILLARY - Abnormal; Notable for the following components:   Glucose-Capillary 142 (*)    All other components within normal limits  I-STAT CHEM 8, ED - Abnormal; Notable for the following components:   BUN 3 (*)    Creatinine, Ser 0.30 (*)    Glucose, Bld 101 (*)    Calcium, Ion 1.12 (*)    Hemoglobin 8.5 (*)    HCT 25.0 (*)    All other components within normal limits  RESP PANEL BY RT-PCR (FLU A&B, COVID) ARPGX2  HIV ANTIBODY (ROUTINE TESTING W REFLEX)  HEMOGLOBIN A1C  TSH  FOLATE  AMMONIA  MAGNESIUM  PHOSPHORUS  URINALYSIS, ROUTINE W REFLEX MICROSCOPIC    EKG None  Radiology CT ABDOMEN PELVIS W CONTRAST  Result Date: 02/21/2021 CLINICAL DATA:  Acute abdominal pain. Patient reports right left hip pain. History of esophageal cancer with bone metastasis. EXAM: CT ABDOMEN AND PELVIS WITH CONTRAST TECHNIQUE: Multidetector CT imaging of the abdomen and pelvis was performed using the standard protocol following bolus administration of intravenous contrast. RADIATION DOSE REDUCTION: This exam was performed according to the departmental dose-optimization program which includes automated exposure control, adjustment of the mA and/or kV according to patient size and/or use of iterative reconstruction technique. CONTRAST:  165mL OMNIPAQUE IOHEXOL 300 MG/ML  SOLN COMPARISON:  PET CT 9 days ago FINDINGS: Lower chest:  Similar distal esophageal wall thickening to prior. No basilar airspace disease, pleural effusion, or nodule. The heart is normal in size. Hepatobiliary: 13 mm low-density lesion in the inferior right lobe of the liver, series 2, image 39. No other liver lesions. Small gallstones without cholecystitis. No biliary dilatation. Pancreas: No ductal dilatation or inflammation. Spleen: Normal in size without focal  abnormality. Splenules anterior and medially. Adrenals/Urinary Tract: Normal adrenal glands. No hydronephrosis or perinephric edema. Homogeneous renal enhancement with symmetric excretion on delayed phase imaging. Tiny hypodensity in the right kidney is too small to characterize but likely small cyst. No renal calculi. Urinary bladder is physiologically distended without wall thickening. Stomach/Bowel: Stomach physiologically distended. There is no small bowel obstruction or inflammation. Normal appendix. Moderate volume of colonic stool without colonic wall thickening. Sigmoid colon is redundant. Left sacral mass approaches the left aspect of the sigmoid colon, however no direct continuity, fat plane is demonstrated. Vascular/Lymphatic: Normal caliber abdominal aorta. Patent portal and splenic veins. Scattered small retroperitoneal nodes are not enlarged by size criteria. There is no pelvic adenopathy. Reproductive: Prostate is unremarkable. Other: Presacral soft tissue edema, large sacral lesion is described below. Minimal fat in the inguinal canals. Small fat containing umbilical hernia. No ascites or free air. Musculoskeletal: Large sacral metastatic lesion is again seen. This measures at least 8.6 x 6.1 cm and is centered in the left sacrum, crosses the midline to involve the right aspect of the sacrum. There is extraosseous extension extending in the anterior presacral space. This lesion crosses the left sacroiliac joint and involves the iliac bone. No evidence of new or additional osseous lesions. IMPRESSION: 1. Large sacral metastatic lesion measuring at least 8.6 x 6.1 cm with extraosseous extension. This lesion approaches the left aspect of the sigmoid colon, however no direct continuity with the sigmoid colon is demonstrated. No significant change in recent PET CT. 2. Unchanged 13 mm low-density lesion in the inferior right lobe of the liver, suspicious for metastatic disease. 3. Cholelithiasis without  cholecystitis. 4. Similar distal esophageal wall thickening, known esophageal malignancy. Electronically Signed   By: Keith Rake M.D.   On: 02/21/2021 16:43   MR SACRUM SI JOINTS W WO CONTRAST  Result Date: 02/21/2021 CLINICAL DATA:  Esophageal cancer with metastatic disease. EXAM: MRI SACRUM WITH CONTRAST TECHNIQUE: Multiplanar multi-sequence MR imaging of the sacrum was performed. CONTRAST:  7 cc Gadavist COMPARISON:  CT earlier same day. FINDINGS: 8 x 10 mm centrally necrotic metastasis affecting the sacrum and surrounding soft tissues. This affects the S1, S2, S3 and upper S4 segments and is more extensive to the left of midline than the right. Tumor fills the sacral spinal canal and extends at least as far as the L5-S1 disc space. All sacral neural foramina are involved. Tumor extends into the presacral space and also extends dorsal to the sacrum to the left of midline. Pathologic fracture at the S2 segment with about 4 mm of offset. Tumor crosses the left sacroiliac joint and involves the left iliac bone. Tumor extends into the sacral hiatus on the left. IMPRESSION: Large metastasis affecting the sacrum with central necrosis. Extensive involvement of the S1, S2 and S3 segments with lesser involvement of upper S4. Lesion is more extensive to the left of midline but does involve all sacral foramina. Tumor fills the sacral spinal canal and extends up to the L5-S1 disc level. Tumor extends into the sacral hiatus on the left. Pathologic fracture at S2 with about 4 mm of offset. Crossing of  the left SI joint with involvement of the iliac. Electronically Signed   By: Nelson Chimes M.D.   On: 02/21/2021 20:18   EEG adult  Result Date: 02/22/2021 Lora Havens, MD     02/22/2021 12:14 PM Patient Name: Jimmy Hawkins MRN: [id number] Epilepsy Attending: Lora Havens Referring Physician/Provider: Elodia Florence., MD Date: 02/22/2021 Duration: 22.11 mins Patient history: 46 year old male  with malignant neoplasm of esophagus, metastatic disease to bone who presented with altered mental status and jerking.  EEG to evaluate for seizure. Level of alertness: Awake AEDs during EEG study: Ativan Technical aspects: This EEG study was done with scalp electrodes positioned according to the 10-20 International system of electrode placement. Electrical activity was acquired at a sampling rate of 500Hz  and reviewed with a high frequency filter of 70Hz  and a low frequency filter of 1Hz . EEG data were recorded continuously and digitally stored. Description: The posterior dominant rhythm consists of 9 Hz activity of moderate voltage (25-35 uV) seen predominantly in posterior head regions, symmetric and reactive to eye opening and eye closing. EEG also showed continuous generalized 3 to 6 Hz theta-delta slowing as well as 15 to 18 Hz beta activity in frontocentral region. Patient was noted to have multiple episodes of brief whole-body spontaneous, nonrhythmic jerking.  Concomitant EEG before, during and after the event did not show any EEG to suggest seizure. Hyperventilation and photic stimulation were not performed.   ABNORMALITY - Continuous slow, generalized IMPRESSION: This study is suggestive of moderate diffuse encephalopathy, nonspecific etiology. Patient was noted to have multiple episodes of brief whole body spontaneous, nonrhythmic jerking without concomitant EEG change.  These episodes were Non-epileptic. No seizures or epileptiform discharges were seen throughout the recording. Lora Havens    Procedures Procedures    Medications Ordered in ED Medications  prochlorperazine (COMPAZINE) tablet 10 mg (has no administration in time range)  pantoprazole (PROTONIX) EC tablet 40 mg (40 mg Oral Given 02/22/21 1711)  gabapentin (NEURONTIN) capsule 300 mg (300 mg Oral Given 02/22/21 2137)  enoxaparin (LOVENOX) injection 40 mg (40 mg Subcutaneous Given 02/22/21 2137)  acetaminophen (TYLENOL) tablet  1,000 mg (1,000 mg Oral Given 02/23/21 0511)    Followed by  acetaminophen (TYLENOL) tablet 650 mg (has no administration in time range)  polyethylene glycol (MIRALAX / GLYCOLAX) packet 17 g (has no administration in time range)  dexamethasone (DECADRON) injection 4 mg (4 mg Intravenous Given 02/23/21 0512)  HYDROmorphone (DILAUDID) injection 1 mg (1 mg Intravenous Given 02/23/21 0334)  diphenhydrAMINE (BENADRYL) capsule 50 mg (has no administration in time range)    And  acetaminophen (TYLENOL) tablet 1,000 mg (has no administration in time range)  LORazepam (ATIVAN) injection 0.5 mg (0.5 mg Intravenous Given 02/21/21 1848)  LORazepam (ATIVAN) tablet 1-4 mg (1 mg Oral Given 02/22/21 0003)    Or  LORazepam (ATIVAN) injection 1-4 mg ( Intravenous See Alternative 02/22/21 0003)  thiamine tablet 100 mg (100 mg Oral Given 02/22/21 0934)    Or  thiamine (B-1) injection 100 mg ( Intravenous See Alternative 03/28/58 1093)  folic acid (FOLVITE) tablet 1 mg (1 mg Oral Given 02/22/21 0932)  multivitamin with minerals tablet 1 tablet (1 tablet Oral Given 02/22/21 0934)  LORazepam (ATIVAN) tablet 0-4 mg (0 mg Oral Not Given 02/23/21 0157)    Followed by  LORazepam (ATIVAN) tablet 0-4 mg (has no administration in time range)  sodium chloride flush (NS) 0.9 % injection 10-40 mL (10 mLs Intracatheter Given 02/22/21 2137)  Chlorhexidine Gluconate Cloth 2 % PADS 6 each (6 each Topical Given 02/22/21 1405)  morphine (MS CONTIN) 12 hr tablet 30 mg (30 mg Oral Given 02/23/21 0511)  thiamine 500mg  in normal saline (53ml) IVPB (0 mg Intravenous Stopped 02/22/21 2206)    Followed by  thiamine (B-1) 250 mg in sodium chloride 0.9 % 50 mL IVPB (has no administration in time range)    Followed by  thiamine tablet 100 mg (has no administration in time range)  megestrol (MEGACE) 400 MG/10ML suspension 400 mg (has no administration in time range)  sucralfate (CARAFATE) 1 GM/10ML suspension 1 g (1 g Oral Given 02/22/21 2150)   HYDROmorphone (DILAUDID) injection 1 mg (1 mg Intravenous Given 02/21/21 1408)  sodium chloride 0.9 % bolus 1,000 mL (0 mLs Intravenous Stopped 02/21/21 1559)  sodium chloride (PF) 0.9 % injection (  Given by Other 02/21/21 1626)  iohexol (OMNIPAQUE) 300 MG/ML solution 100 mL (100 mLs Intravenous Contrast Given 02/21/21 1612)  HYDROmorphone (DILAUDID) injection 1 mg (1 mg Intravenous Given 02/21/21 1721)  methylPREDNISolone sodium succinate (SOLU-MEDROL) 125 mg/2 mL injection 125 mg (125 mg Intravenous Given 02/21/21 1722)  gadobutrol (GADAVIST) 1 MMOL/ML injection 7 mL (7 mLs Intravenous Contrast Given 02/21/21 1939)    ED Course/ Medical Decision Making/ A&P Patient with metastatic esophageal cancer.  He presented with right hip pain and right lower quadrant pain.  He was seen by his oncologist who requested the patient be admitted to medicine for pain control and palliative care    This patient presents to the ED for concern of abdominal pain, this involves an extensive number of treatment options, and is a complaint that carries with it a high risk of complications and morbidity.  The differential diagnosis includes pain from his metastatic esophageal cancer   Co morbidities that complicate the patient evaluation  Metastatic cancer, GERD   Additional history obtained:  Additional history obtained from niece External records from outside source obtained and reviewed including hospital record   Lab Tests:  I Ordered, and personally interpreted labs.  The pertinent results include: CBC showed a low white count 2.6 and anemia at 8.9   Imaging Studies ordered:  I ordered imaging studies including CT abdomen I independently visualized and interpreted imaging which showed large sacral metastases I agree with the radiologist interpretation   Cardiac Monitoring:  The patient was maintained on a cardiac monitor.  I personally viewed and interpreted the cardiac monitored which showed an  underlying rhythm of: Sinus tach   Medicines ordered and prescription drug management:  I ordered medication including Dilaudid for pain Reevaluation of the patient after these medicines showed that the patient improved I have reviewed the patients home medicines and have made adjustments as needed   Test Considered:  MRI   Critical Interventions:  Oncology consult   Consultations Obtained:  I requested consultation with the oncology ,  and discussed lab and imaging findings as well as pertinent plan - they recommend: Admission for pain control and palliative care   Problem List / ED Course:  Metastatic cancer   Reevaluation:  After the interventions noted above, I reevaluated the patient and found that they have :stayed the same   Social Determinants of Health:  Patient does not speak English   Dispostion:  After consideration of the diagnostic results and the patients response to treatment, I feel that the patent would benefit from admission for pain control and palliative care.  Medical Decision Making Amount and/or Complexity of Data Reviewed Labs: ordered. Radiology: ordered.  Risk Prescription drug management. Decision regarding hospitalization.   Document your independent review of radiology images, and any outside records:1}    Final Clinical Impression(s) / ED Diagnoses Final diagnoses:  Metastatic malignant neoplasm, unspecified site [facility])    Rx / DC Orders ED Discharge Orders     None         Milton Ferguson, MD 02/23/21 [phone]

## 2021-02-23 NOTE — Evaluation (Signed)
Physical Therapy Evaluation Patient Details Name: Jimmy Hawkins MRN: [id number] DOB: 1975-10-12 Today's Date: 02/23/2021  History of Present Illness  46 yo with hx etoh abuse and metastatic esophageal cancer currently on palliative chemotherapy  Clinical Impression  Pt admitted as above and presenting with functional mobility limitations 2* generalized weakness, L drop foot, balance deficits and low back and hip pain (exacerbated with OOB activity most especially sitting).  Pt very cooperative and hopes to progress to dc home with family assist.  Pt and family expressing interest in follow up HHPT to further address deficits.     Recommendations for follow up therapy are one component of a multi-disciplinary discharge planning process, led by the attending physician.  Recommendations may be updated based on patient status, additional functional criteria and insurance authorization.  Follow Up Recommendations Home health PT    Assistance Recommended at Discharge    Patient can return home with the following  A little help with walking and/or transfers;A little help with bathing/dressing/bathroom;Assistance with cooking/housework;Assist for transportation;Help with stairs or ramp for entrance    Equipment Recommendations None recommended by PT  Recommendations for Other Services       Functional Status Assessment       Precautions / Restrictions Precautions Precautions: Fall Restrictions Weight Bearing Restrictions: No      Mobility  Bed Mobility Overal bed mobility: Modified Independent                  Transfers Overall transfer level: Needs assistance Equipment used: Rolling walker (2 wheels) Transfers: Sit to/from Stand Sit to Stand: Min guard           General transfer comment: steady assist only    Ambulation/Gait Ambulation/Gait assistance: Min guard Gait Distance (Feet): 90 Feet Assistive device: Rolling walker (2 wheels) Gait  Pattern/deviations: Step-to pattern, Step-through pattern, Decreased step length - right, Decreased step length - left, Shuffle, Trunk flexed Gait velocity: decr     General Gait Details: Noted instability but no overt LOB with RW; increased difficulty advancing L LE with noted L drop foot  Stairs            Wheelchair Mobility    Modified Rankin (Stroke Patients Only)       Balance Overall balance assessment: Needs assistance Sitting-balance support: No upper extremity supported, Feet supported Sitting balance-Leahy Scale: Good     Standing balance support: No upper extremity supported Standing balance-Leahy Scale: Fair                               Pertinent Vitals/Pain Pain Assessment Pain Assessment: 0-10 Pain Score: 7  Pain Location: hips Pain Descriptors / Indicators: Aching Pain Intervention(s): Limited activity within patient's tolerance, Monitored during session, Premedicated before session    Home Living Family/patient expects to be discharged to:: Private residence Living Arrangements: Spouse/significant other;Children;Other relatives Available Help at Discharge: Family;Available 24 hours/day Type of Home: House Home Access: Stairs to enter Entrance Stairs-Rails: None Entrance Stairs-Number of Steps: 4   Home Layout: One level Home Equipment: Conservation officer, nature (2 wheels);BSC/3in1;Wheelchair - manual;Crutches      Prior Function Prior Level of Function : Independent/Modified Independent             Mobility Comments: uses walker to ambulate ADLs Comments: grossly able to perform ADLs, reports having assistance wtih showering. Reports he used to sit in shower but not he has stand due to pain  Hand Dominance   Dominant Hand: Right    Extremity/Trunk Assessment   Upper Extremity Assessment Upper Extremity Assessment: Overall WFL for tasks assessed    Lower Extremity Assessment Lower Extremity Assessment: Generalized  weakness;LLE deficits/detail LLE Deficits / Details: Pt with no AROM at foot/ankle, 3- at knee and 2+ at hip    Cervical / Trunk Assessment Cervical / Trunk Assessment: Normal  Communication   Communication: Prefers language other than English  Cognition Arousal/Alertness: Awake/alert Behavior During Therapy: WFL for tasks assessed/performed Overall Cognitive Status: Within Functional Limits for tasks assessed                                          General Comments      Exercises     Assessment/Plan    PT Assessment Patient needs continued PT services  PT Problem List Decreased strength;Decreased activity tolerance;Decreased balance;Decreased mobility;Decreased knowledge of use of DME;Pain       PT Treatment Interventions DME instruction;Gait training;Stair training;Functional mobility training;Therapeutic activities;Therapeutic exercise;Patient/family education;Balance training    PT Goals (Current goals can be found in the Care Plan section)  Acute Rehab PT Goals Patient Stated Goal: Regain strength and IND PT Goal Formulation: With patient Time For Goal Achievement: 04/05/21 Potential to Achieve Goals: Fair    Frequency Min 3X/week     Co-evaluation               AM-PAC PT "6 Clicks" Mobility  Outcome Measure Help needed turning from your back to your side while in a flat bed without using bedrails?: None Help needed moving from lying on your back to sitting on the side of a flat bed without using bedrails?: None Help needed moving to and from a bed to a chair (including a wheelchair)?: A Little Help needed standing up from a chair using your arms (e.g., wheelchair or bedside chair)?: A Little Help needed to walk in hospital room?: A Little Help needed climbing 3-5 steps with a railing? : A Lot 6 Click Score: 19    End of Session Equipment Utilized During Treatment: Gait belt Activity Tolerance: Patient limited by pain;Patient limited  by fatigue Patient left: in bed;with call bell/phone within reach;with family/visitor present Nurse Communication: Mobility status PT Visit Diagnosis: Muscle weakness (generalized) (M62.81);Unsteadiness on feet (R26.81);Difficulty in walking, not elsewhere classified (R26.2);Pain    Time: 1520-1536 PT Time Calculation (min) (ACUTE ONLY): 16 min   Charges:   PT Evaluation $PT Eval Low Complexity: 1 Low          [place] Pager [phone] Office [phone]   [facility] 02/23/2021, 4:38 PM

## 2021-02-23 NOTE — Evaluation (Signed)
Occupational Therapy Evaluation Patient Details Name: Jimmy Hawkins MRN: [id number] DOB: [birth year] Today's Date: 02/23/2021   History of Present Illness 46 yo with hx etoh abuse and metastatic esophageal cancer currently on palliative chemotherapy   Clinical Impression   Jimmy Hawkins is a 46 year old man with uncontrolled pain due to metastatic cancer. On evaluation he demonstrated ability to transfer in and out of bed, ambulate with walker in hallway and reports baseline abilities to perform ADLs. From an OT standpoint patient appears to be at his baseline. He has assistance of family at home if needed and all needed DME. No OT needs at this time. Patient and family are also agreeable that patient has no OT needs. Defer to PT eval for recommendations.     Recommendations for follow up therapy are one component of a multi-disciplinary discharge planning process, led by the attending physician.  Recommendations may be updated based on patient status, additional functional criteria and insurance authorization.   Follow Up Recommendations  No OT follow up    Assistance Recommended at Discharge Intermittent Supervision/Assistance  Patient can return home with the following A little help with bathing/dressing/bathroom;Assistance with cooking/housework    Functional Status Assessment  Patient has not had a recent decline in their functional status  Equipment Recommendations  None recommended by OT    Recommendations for Other Services       Precautions / Restrictions Precautions Precautions: Fall Restrictions Weight Bearing Restrictions: No      Mobility Bed Mobility Overal bed mobility: Modified Independent                  Transfers Overall transfer level: Needs assistance Equipment used: Rolling walker (2 wheels) Transfers: Sit to/from Stand Sit to Stand: Min guard           General transfer comment: Min guard to ambulate in hall  with walker. Upper extremities slightly tremulous on walker      Balance Overall balance assessment: Mild deficits observed, not formally tested                                         ADL either performed or assessed with clinical judgement   ADL Overall ADL's : At baseline                                             Vision   Vision Assessment?: No apparent visual deficits     Perception     Praxis      Pertinent Vitals/Pain Pain Assessment Pain Assessment: 0-10 Pain Score: 7  Pain Location: hips Pain Intervention(s): Limited activity within patient's tolerance, Premedicated before session     Hand Dominance Right   Extremity/Trunk Assessment Upper Extremity Assessment Upper Extremity Assessment: Overall WFL for tasks assessed   Lower Extremity Assessment Lower Extremity Assessment: Defer to PT evaluation   Cervical / Trunk Assessment Cervical / Trunk Assessment: Normal   Communication Communication Communication: Prefers language other than English   Cognition Arousal/Alertness: Awake/alert Behavior During Therapy: WFL for tasks assessed/performed Overall Cognitive Status: Within Functional Limits for tasks assessed  General Comments       Exercises     Shoulder Instructions      Home Living Family/patient expects to be discharged to:: Private residence Living Arrangements: Spouse/significant other;Children;Other relatives Available Help at Discharge: Family;Available 24 hours/day Type of Home: House Home Access: Stairs to enter [employer] of Steps: 4 Entrance Stairs-Rails: None Home Layout: One level     Bathroom Shower/Tub: Teacher, early years/pre: Standard     Home Equipment: Conservation officer, nature (2 wheels);BSC/3in1;Wheelchair - manual;Crutches          Prior Functioning/Environment               Mobility Comments: uses  walker to ambulate ADLs Comments: grossly able to perform ADLs, reports having assistance wtih showering. Reports he used to sit in shower but not he has stand due to pain        OT Problem List: Pain      OT Treatment/Interventions:      OT Goals(Current goals can be found in the care plan section) Acute Rehab OT Goals OT Goal Formulation: All assessment and education complete, DC therapy  OT Frequency:      Co-evaluation              AM-PAC OT "6 Clicks" Daily Activity     Outcome Measure Help from another person eating meals?: None Help from another person taking care of personal grooming?: None Help from another person toileting, which includes using toliet, bedpan, or urinal?: None Help from another person bathing (including washing, rinsing, drying)?: A Little Help from another person to put on and taking off regular upper body clothing?: None Help from another person to put on and taking off regular lower body clothing?: None 6 Click Score: 23   End of Session Equipment Utilized During Treatment: Rolling walker (2 wheels) Nurse Communication: Mobility status  Activity Tolerance: Patient tolerated treatment well Patient left: in bed;with call bell/phone within reach;with bed alarm set;with family/visitor present  OT Visit Diagnosis: Pain                Time: 1540-0867 OT Time Calculation (min): 15 min Charges:  OT General Charges $OT Visit: 1 Visit OT Evaluation $OT Eval Low Complexity: 1 Low  Petr Bontempo, OTR/[facility] [place]  Office [phone] Pager: [phone]   Lenward Chancellor 02/23/2021, 3:55 PM

## 2021-02-23 NOTE — Progress Notes (Signed)
PROGRESS NOTE    Jimmy Hawkins  YWV:[id number] DOB: 05/01/1975 DOA: 02/21/2021 PCP: [name], MD  No chief complaint on file.   Brief Narrative:  46 yo with hx etoh abuse and metastatic esophageal cancer currently on palliative chemotherapy with Dr. Burr Medico.  He called Dr. Burr Medico on the day of admission with worsening hip pain and he was directed to the ED.  He recently saw Dr. Burr Medico on 1/20 and they had discussion regarding his treatment options and decided on plan to try irinotecan.  He had significant pain and his MS contin dose was adjusted and hydrocodone was transitioned to dilaudid.  He presents to the ED with progressive pain.  Also notes urinary issues and lower extremity weakness.  He drinks significantly, 5 beers daily, but denies any hx of withdrawal.  Denies other symptoms like fevers, chills, chest pain, shortness of breath.      Assessment & Plan:   Principal Problem:   Metastatic cancer ([place]) Active Problems:   Malignant neoplasm of esophagus (HCC)   Cancer related pain   Bone metastases (HCC)   Lower extremity weakness   Difficulty urinating   Alcohol abuse   Acute metabolic encephalopathy   Myoclonic jerking   Anemia   Steroid-induced hyperglycemia   Malignant neoplasm of esophagus (HCC) Followed by Dr. Burr Medico Recently had discussion regarding hospice vs continued treatment and decided to continue chemo.  Started on irinotecan on 1/20.   Appreciate her assistance Likely to transition to [place] after completion of Radiation Therapy per Dr. Burr Medico note from 1/27  Bone metastases [facility] (present on admission) CT with large sacral metastatic lesion 8.6x6.1 with extraosseous extension He's s/p palliative radiation to sacrum 03/26/20-04/10/20 and short course of dex PET 1/17 with increase in size of metastatic lesion in L hemisacrum MRI sacrum with large met with central necrosis, extensive involvement of S1, S2, and S3 segments with lesser involvement of  upper S4.  Lesion is more extensive to L of midline, but involves all sacral foramina.  Tumor fills sacral spinal canal and extends up to L5-S1 disc level.  Extends into scaral hiatus on the L.  Pathologic fx at S2.  Grossing of L SI joint with involvement of iliac.  Plan for Radiation Therapy to start 02/26/2021       Cancer related pain Continue MS contin scheduled Continue IV dilaudid prn Continue MS contin at reduced dose for now, myoclonic jerks improved - will defer transition to another opiate to palliative care if that's recommended Dilaudid prn Scheduled APAP Steroids Palliative care for assistance with pain Continuous pulse ox  Difficulty urinating Likely related to metastatic disease Follow MRI as above UA, bladder scan  Lower extremity weakness Likely related to mets, discussed with rads, will follow MRI sacrum (as above) Start dexamethasone Radiation oncology c/s as above PT/OT  Myoclonic jerking Related to pain meds Improved with reduction in dose of morphine  Acute metabolic encephalopathy Suspect related to pain meds This is improved today Ammonia wnl, b12 elevated, folate wnl, VBG without hypercarbia EEG with moderate diffuse encephalopathy, nonspecific etiology - non epileptic jerking episodes - no seizures or epileptiform discharges Jerking has improved with reduction in morphine Consider brain imaging  High dose thiamine given hx etoh abuse   Alcohol abuse- (present on admission) CIWA High dose thiamine with encephalopathy  Anemia Chronic, follow Normal B12, folate AOCD with iron def  Steroid-induced hyperglycemia a1c 4.8 Will stop SSI and BG checks, follow on daily labs   DVT prophylaxis:  lovenox Code Status: DNR Family Communication: niece, sister Disposition:   Status is: Observation  The patient will require care spanning > 2 midnights and should be moved to inpatient because: need for evaluation of encephalopathy, rad eval,  oncology eval, palliative care eval      Consultants:  Rad onc oncology  Procedures:  none  Antimicrobials:  Anti-infectives (From admission, onward)    None       Subjective: No complaints Sister at bedside and niece Interpreter used  Objective: Vitals:   02/23/21 0153 02/23/21 0511 02/23/21 0950 02/23/21 1020  BP: 99/68 106/74 104/70 104/70  Pulse: 70 71 79 79  Resp: 14 18    Temp: 98.5 F (36.9 C) 98.1 F (36.7 C)  98.2 F (36.8 C)  TempSrc: Oral Oral  Oral  SpO2: 100% 100%  100%  Weight:      Height:        Intake/Output Summary (Last 24 hours) at 02/23/2021 1351 Last data filed at 02/23/2021 0341 Gross per 24 hour  Intake 100 ml  Output 1350 ml  Net -1250 ml   Filed Weights   02/21/21 1301  Weight: 69.1 kg    Examination:  General: No acute distress. Cardiovascular: RRR Lungs: unlabored Abdomen: Soft, nontender, nondistended  Neurological: Alert and oriented 3. Upper extremity strength preserved, symmetric.  Bilateral LE weakness, worse on L. Skin: Warm and dry. No rashes or lesions. Extremities: No clubbing or cyanosis. No edema.     Data Reviewed: I have personally reviewed following labs and imaging studies  CBC: Recent Labs  Lab 02/21/21 1409 02/21/21 1606 02/21/21 1802 02/22/21 0439 02/23/21 0518  WBC 3.6*  --  2.6* 2.2* 3.5*  NEUTROABS 2.8  --   --   --  3.2  HGB 8.7* 8.5* 8.9* 9.3* 8.6*  HCT 26.4* 25.0* 27.7* 28.0* 26.4*  MCV 97.4  --  97.9 97.2 97.1  PLT 169  --  168 173 063    Basic Metabolic Panel: Recent Labs  Lab 02/21/21 1409 02/21/21 1606 02/21/21 1802 02/22/21 0439 02/23/21 0518  NA 131* 136  --  133* 134*  K 3.3* 3.5  --  3.6 3.5  CL 101 103  --  102 103  CO2 25  --   --  23 25  GLUCOSE 105* 101*  --  172* 137*  BUN 8 3*  --  8 9  CREATININE 0.48* 0.30* 0.35* 0.33* <0.30*  CALCIUM 8.1*  --   --  8.7* 8.8*  MG  --   --   --   --  2.0  PHOS  --   --   --   --  3.5    GFR: CrCl cannot be  calculated (This lab value cannot be used to calculate CrCl because it is not Tara Wich number: <0.30).  Liver Function Tests: Recent Labs  Lab 02/21/21 1409 02/22/21 0439 02/23/21 0518  AST 14* 13* 15  ALT '15 16 16  ' ALKPHOS 58 59 56  BILITOT 0.5 0.6 0.5  PROT 6.2* 6.8 6.6  ALBUMIN 3.1* 3.1* 3.1*    CBG: Recent Labs  Lab 02/22/21 1343 02/22/21 1739 02/22/21 2200 02/23/21 0742 02/23/21 1214  GLUCAP 127* 172* 142* 142* 150*     Recent Results (from the past 240 hour(s))  Resp Panel by RT-PCR (Flu Marialena Wollen&B, Covid) Nasopharyngeal Swab     Status: None   Collection Time: 02/21/21  6:31 PM   Specimen: Nasopharyngeal Swab; Nasopharyngeal(NP) swabs in vial transport medium  Result Value Ref Range Status   SARS Coronavirus 2 by RT PCR NEGATIVE NEGATIVE Final    Comment: (NOTE) SARS-CoV-2 target nucleic acids are NOT DETECTED.  The SARS-CoV-2 RNA is generally detectable in upper respiratory specimens during the acute phase of infection. The lowest concentration of SARS-CoV-2 viral copies this assay can detect is 138 copies/mL. Suzanne Kho negative result does not preclude SARS-Cov-2 infection and should not be used as the sole basis for treatment or other patient management decisions. Kattia Selley negative result may occur with  improper specimen collection/handling, submission of specimen other than nasopharyngeal swab, presence of viral mutation(s) within the areas targeted by this assay, and inadequate number of viral copies(<138 copies/mL). Jahki Witham negative result must be combined with clinical observations, patient history, and epidemiological information. The expected result is Negative.  Fact Sheet for Patients:  EntrepreneurPulse.com.au  Fact Sheet for Healthcare Providers:  IncredibleEmployment.be  This test is no t yet approved or cleared by the [place] FDA and  has been authorized for detection and/or diagnosis of SARS-CoV-2 by FDA under an Emergency Use  Authorization (EUA). This EUA will remain  in effect (meaning this test can be used) for the duration of the COVID-19 declaration under Section 564(b)(1) of the Act, 21 U.S.C.section 360bbb-3(b)(1), unless the authorization is terminated  or revoked sooner.       Influenza Vishruth Seoane by PCR NEGATIVE NEGATIVE Final   Influenza B by PCR NEGATIVE NEGATIVE Final    Comment: (NOTE) The Xpert Xpress SARS-CoV-2/FLU/RSV plus assay is intended as an aid in the diagnosis of influenza from Nasopharyngeal swab specimens and should not be used as Gearold Wainer sole basis for treatment. Nasal washings and aspirates are unacceptable for Xpert Xpress SARS-CoV-2/FLU/RSV testing.  Fact Sheet for Patients: EntrepreneurPulse.com.au  Fact Sheet for Healthcare Providers: IncredibleEmployment.be  This test is not yet approved or cleared by the [place] FDA and has been authorized for detection and/or diagnosis of SARS-CoV-2 by FDA under an Emergency Use Authorization (EUA). This EUA will remain in effect (meaning this test can be used) for the duration of the COVID-19 declaration under Section 564(b)(1) of the Act, 21 U.S.C. section 360bbb-3(b)(1), unless the authorization is terminated or revoked.  Performed at [facility], [place] [address] [place], [place] [address]          Radiology Studies: CT ABDOMEN PELVIS W CONTRAST  Result Date: 02/21/2021 CLINICAL DATA:  Acute abdominal pain. Patient reports right left hip pain. History of esophageal cancer with bone metastasis. EXAM: CT ABDOMEN AND PELVIS WITH CONTRAST TECHNIQUE: Multidetector CT imaging of the abdomen and pelvis was performed using the standard protocol following bolus administration of intravenous contrast. RADIATION DOSE REDUCTION: This exam was performed according to the departmental dose-optimization program which includes automated exposure control, adjustment of the mA and/or kV according to  patient size and/or use of iterative reconstruction technique. CONTRAST:  170m OMNIPAQUE IOHEXOL 300 MG/ML  SOLN COMPARISON:  PET CT 9 days ago FINDINGS: Lower chest: Similar distal esophageal wall thickening to prior. No basilar airspace disease, pleural effusion, or nodule. The heart is normal in size. Hepatobiliary: 13 mm low-density lesion in the inferior right lobe of the liver, series 2, image 39. No other liver lesions. Small gallstones without cholecystitis. No biliary dilatation. Pancreas: No ductal dilatation or inflammation. Spleen: Normal in size without focal abnormality. Splenules anterior and medially. Adrenals/Urinary Tract: Normal adrenal glands. No hydronephrosis or perinephric edema. Homogeneous renal enhancement with symmetric excretion on delayed phase imaging. Tiny hypodensity in the  right kidney is too small to characterize but likely small cyst. No renal calculi. Urinary bladder is physiologically distended without wall thickening. Stomach/Bowel: Stomach physiologically distended. There is no small bowel obstruction or inflammation. Normal appendix. Moderate volume of colonic stool without colonic wall thickening. Sigmoid colon is redundant. Left sacral mass approaches the left aspect of the sigmoid colon, however no direct continuity, fat plane is demonstrated. Vascular/Lymphatic: Normal caliber abdominal aorta. Patent portal and splenic veins. Scattered small retroperitoneal nodes are not enlarged by size criteria. There is no pelvic adenopathy. Reproductive: Prostate is unremarkable. Other: Presacral soft tissue edema, large sacral lesion is described below. Minimal fat in the inguinal canals. Small fat containing umbilical hernia. No ascites or free air. Musculoskeletal: Large sacral metastatic lesion is again seen. This measures at least 8.6 x 6.1 cm and is centered in the left sacrum, crosses the midline to involve the right aspect of the sacrum. There is extraosseous extension  extending in the anterior presacral space. This lesion crosses the left sacroiliac joint and involves the iliac bone. No evidence of new or additional osseous lesions. IMPRESSION: 1. Large sacral metastatic lesion measuring at least 8.6 x 6.1 cm with extraosseous extension. This lesion approaches the left aspect of the sigmoid colon, however no direct continuity with the sigmoid colon is demonstrated. No significant change in recent PET CT. 2. Unchanged 13 mm low-density lesion in the inferior right lobe of the liver, suspicious for metastatic disease. 3. Cholelithiasis without cholecystitis. 4. Similar distal esophageal wall thickening, known esophageal malignancy. Electronically Signed   By: Keith Rake M.D.   On: 02/21/2021 16:43   MR SACRUM SI JOINTS W WO CONTRAST  Result Date: 02/21/2021 CLINICAL DATA:  Esophageal cancer with metastatic disease. EXAM: MRI SACRUM WITH CONTRAST TECHNIQUE: Multiplanar multi-sequence MR imaging of the sacrum was performed. CONTRAST:  7 cc Gadavist COMPARISON:  CT earlier same day. FINDINGS: 8 x 10 mm centrally necrotic metastasis affecting the sacrum and surrounding soft tissues. This affects the S1, S2, S3 and upper S4 segments and is more extensive to the left of midline than the right. Tumor fills the sacral spinal canal and extends at least as far as the L5-S1 disc space. All sacral neural foramina are involved. Tumor extends into the presacral space and also extends dorsal to the sacrum to the left of midline. Pathologic fracture at the S2 segment with about 4 mm of offset. Tumor crosses the left sacroiliac joint and involves the left iliac bone. Tumor extends into the sacral hiatus on the left. IMPRESSION: Large metastasis affecting the sacrum with central necrosis. Extensive involvement of the S1, S2 and S3 segments with lesser involvement of upper S4. Lesion is more extensive to the left of midline but does involve all sacral foramina. Tumor fills the sacral spinal  canal and extends up to the L5-S1 disc level. Tumor extends into the sacral hiatus on the left. Pathologic fracture at S2 with about 4 mm of offset. Crossing of the left SI joint with involvement of the iliac. Electronically Signed   By: Nelson Chimes M.D.   On: 02/21/2021 20:18   EEG adult  Result Date: 02/22/2021 Lora Havens, MD     02/22/2021 12:14 PM Patient Name: Bolton Canupp MRN: [id number] Epilepsy Attending: Lora Havens Referring Physician/Provider: Elodia Florence., MD Date: 02/22/2021 Duration: 22.11 mins Patient history: 46 year old male with malignant neoplasm of esophagus, metastatic disease to bone who presented with altered mental status and jerking.  EEG to  evaluate for seizure. Level of alertness: Awake AEDs during EEG study: Ativan Technical aspects: This EEG study was done with scalp electrodes positioned according to the 10-20 International system of electrode placement. Electrical activity was acquired at Carmon Brigandi sampling rate of '500Hz'  and reviewed with Burnette Valenti high frequency filter of '70Hz'  and Elma Shands low frequency filter of '1Hz' . EEG data were recorded continuously and digitally stored. Description: The posterior dominant rhythm consists of 9 Hz activity of moderate voltage (25-35 uV) seen predominantly in posterior head regions, symmetric and reactive to eye opening and eye closing. EEG also showed continuous generalized 3 to 6 Hz theta-delta slowing as well as 15 to 18 Hz beta activity in frontocentral region. Patient was noted to have multiple episodes of brief whole-body spontaneous, nonrhythmic jerking.  Concomitant EEG before, during and after the event did not show any EEG to suggest seizure. Hyperventilation and photic stimulation were not performed.   ABNORMALITY - Continuous slow, generalized IMPRESSION: This study is suggestive of moderate diffuse encephalopathy, nonspecific etiology. Patient was noted to have multiple episodes of brief whole body spontaneous, nonrhythmic  jerking without concomitant EEG change.  These episodes were Non-epileptic. No seizures or epileptiform discharges were seen throughout the recording. Priyanka Barbra Sarks        Scheduled Meds:  acetaminophen  1,000 mg Oral Q8H   Chlorhexidine Gluconate Cloth  6 each Topical Daily   dexamethasone (DECADRON) injection  4 mg Intravenous Q6H   enoxaparin (LOVENOX) injection  40 mg Subcutaneous N50C   folic acid  1 mg Oral Daily   gabapentin  300 mg Oral TID   LORazepam  0-4 mg Oral Q6H   Followed by   LORazepam  0-4 mg Oral Q12H   megestrol  400 mg Oral Daily   morphine  30 mg Oral Q8H   multivitamin with minerals  1 tablet Oral Daily   pantoprazole  40 mg Oral BID AC   sodium chloride flush  10-40 mL Intracatheter Q12H   sucralfate  1 g Oral QID   thiamine  100 mg Oral Daily   Or   thiamine  100 mg Intravenous Daily   [START ON 03/02/2021] thiamine  100 mg Oral Daily   Continuous Infusions:  thiamine injection 500 mg (02/23/21 1007)   Followed by   Derrill Memo ON 02/25/2021] thiamine injection       LOS: 1 day    Time spent: over 30 min    Fayrene Helper, MD [facility]   To contact the attending provider between 7A-7P or the covering provider during after hours 7P-7A, please log into the web site [email or web address] and access using universal [place] password for that web site. If you do not have the password, please call the hospital operator.  02/23/2021, 1:51 PM

## 2021-02-23 NOTE — Plan of Care (Signed)
  Problem: Activity: Goal: Risk for activity intolerance will decrease Outcome: Progressing   Problem: Nutrition: Goal: Adequate nutrition will be maintained Outcome: Progressing   Problem: Pain Managment: Goal: General experience of comfort will improve Outcome: Progressing   

## 2021-02-24 LAB — COMPREHENSIVE METABOLIC PANEL
ALT: 19 U/L (ref 0–44)
AST: 18 U/L (ref 15–41)
Albumin: 3 g/dL — ABNORMAL LOW (ref 3.5–5.0)
Alkaline Phosphatase: 54 U/L (ref 38–126)
Anion gap: 7 (ref 5–15)
BUN: 10 mg/dL (ref 6–20)
CO2: 24 mmol/L (ref 22–32)
Calcium: 8.8 mg/dL — ABNORMAL LOW (ref 8.9–10.3)
Chloride: 101 mmol/L (ref 98–111)
Creatinine, Ser: 0.47 mg/dL — ABNORMAL LOW (ref 0.61–1.24)
GFR, Estimated: >60 mL/min (ref >60)
Glucose, Bld: 142 mg/dL — ABNORMAL HIGH (ref 70–99)
Potassium: 3.2 mmol/L — ABNORMAL LOW (ref 3.5–5.1)
Sodium: 132 mmol/L — ABNORMAL LOW (ref 135–145)
Total Bilirubin: 0.5 mg/dL (ref 0.3–1.2)
Total Protein: 6.2 g/dL — ABNORMAL LOW (ref 6.5–8.1)

## 2021-02-24 LAB — CBC WITH DIFFERENTIAL/PLATELET
Abs Immature Granulocytes: 0.03 10*3/uL (ref 0.00–0.07)
Basophils Absolute: 0 10*3/uL (ref 0.0–0.1)
Basophils Relative: 0 %
Eosinophils Absolute: 0 10*3/uL (ref 0.0–0.5)
Eosinophils Relative: 0 %
HCT: 27.2 % — ABNORMAL LOW (ref 39.0–52.0)
Hemoglobin: 8.8 g/dL — ABNORMAL LOW (ref 13.0–17.0)
Immature Granulocytes: 1 %
Lymphocytes Relative: 6 %
Lymphs Abs: 0.2 10*3/uL — ABNORMAL LOW (ref 0.7–4.0)
MCH: 31.8 pg (ref 26.0–34.0)
MCHC: 32.4 g/dL (ref 30.0–36.0)
MCV: 98.2 fL (ref 80.0–100.0)
Monocytes Absolute: 0.1 10*3/uL (ref 0.1–1.0)
Monocytes Relative: 5 %
Neutro Abs: 2.3 10*3/uL (ref 1.7–7.7)
Neutrophils Relative %: 88 %
Platelets: 177 10*3/uL (ref 150–400)
RBC: 2.77 MIL/uL — ABNORMAL LOW (ref 4.22–5.81)
RDW: 14.7 % (ref 11.5–15.5)
WBC: 2.6 10*3/uL — ABNORMAL LOW (ref 4.0–10.5)
nRBC: 0 % (ref 0.0–0.2)

## 2021-02-24 LAB — PHOSPHORUS: Phosphorus: 3 mg/dL (ref 2.5–4.6)

## 2021-02-24 LAB — MAGNESIUM: Magnesium: 1.9 mg/dL (ref 1.7–2.4)

## 2021-02-24 MED ORDER — INFLUENZA VAC SPLIT QUAD 0.5 ML IM SUSY
0.5000 mL | PREFILLED_SYRINGE | INTRAMUSCULAR | Status: DC
Start: 2021-02-25 — End: 2021-02-24

## 2021-02-24 MED ORDER — HYDROMORPHONE HCL 1 MG/ML IJ SOLN
1.0000 mg | Freq: Once | INTRAMUSCULAR | Status: AC
  Administered 2021-02-24: 1 mg via INTRAVENOUS
  Filled 2021-02-24: qty 1

## 2021-02-24 MED ORDER — INFLUENZA VAC SPLIT QUAD 0.5 ML IM SUSY
0.5000 mL | PREFILLED_SYRINGE | Freq: Once | INTRAMUSCULAR | Status: AC
  Administered 2021-02-24: 0.5 mL via INTRAMUSCULAR
  Filled 2021-02-24: qty 0.5

## 2021-02-24 MED ORDER — POTASSIUM CHLORIDE CRYS ER 20 MEQ PO TBCR
40.0000 meq | EXTENDED_RELEASE_TABLET | ORAL | Status: AC
  Administered 2021-02-24 (×2): 40 meq via ORAL
  Filled 2021-02-24 (×2): qty 2

## 2021-02-24 NOTE — Progress Notes (Signed)
PROGRESS NOTE    Maria Gallicchio  CNO:[id number] DOB: [birth year] DOA: 02/21/2021 PCP: [name], MD  No chief complaint on file.   Brief Narrative:  46 yo with hx etoh abuse and metastatic esophageal cancer currently on palliative chemotherapy with Dr. Burr Medico.  He called Dr. Burr Medico on the day of admission with worsening hip pain and he was directed to the ED.  He recently saw Dr. Burr Medico on 1/20 and they had discussion regarding his treatment options and decided on plan to try irinotecan.  He had significant pain and his MS contin dose was adjusted and hydrocodone was transitioned to dilaudid.  He presents to the ED with progressive pain.  Also notes urinary issues and lower extremity weakness.  He drinks significantly, 5 beers daily, but denies any hx of withdrawal.  Denies other symptoms like fevers, chills, chest pain, shortness of breath.      Assessment & Plan:   Principal Problem:   Metastatic cancer ([place]) Active Problems:   Malignant neoplasm of esophagus (HCC)   Cancer related pain   Bone metastases (HCC)   Lower extremity weakness   Difficulty urinating   Alcohol abuse   Acute metabolic encephalopathy   Myoclonic jerking   Anemia   Steroid-induced hyperglycemia   Malignant neoplasm of esophagus (HCC) Followed by Dr. Burr Medico Recently had discussion regarding hospice vs continued treatment and decided to continue chemo.  Started on irinotecan on 1/20.   Appreciate her assistance Likely to transition to [place] after completion of Radiation Therapy per Dr. Burr Medico note from 1/27  Bone metastases [facility])- (present on admission) CT with large sacral metastatic lesion 8.6x6.1 with extraosseous extension He's s/p palliative radiation to sacrum 03/26/20-04/10/20 and short course of dex PET 1/17 with increase in size of metastatic lesion in L hemisacrum MRI sacrum with large met with central necrosis, extensive involvement of S1, S2, and S3 segments with lesser involvement of  upper S4.  Lesion is more extensive to L of midline, but involves all sacral foramina.  Tumor fills sacral spinal canal and extends up to L5-S1 disc level.  Extends into scaral hiatus on the L.  Pathologic fx at S2.  Grossing of L SI joint with involvement of iliac.  Plan for Radiation Therapy to start 02/26/2021       Cancer related pain Transitioned to oxycontin 20 mg TID per palliative care Continue IV dilaudid prn Dilaudid prn Scheduled APAP Steroids Palliative care for assistance with pain - appreciate assistance Continuous pulse ox  Difficulty urinating Likely related to metastatic disease Follow MRI as above UA, bladder scan  Lower extremity weakness Likely related to mets, discussed with rads, will follow MRI sacrum (as above) Start dexamethasone Radiation oncology c/s as above PT/OT  Myoclonic jerking Related to pain meds Improved with transition to oxycodone  Acute metabolic encephalopathy Suspect related to pain meds This is improved today Ammonia wnl, b12 elevated, folate wnl, VBG without hypercarbia EEG with moderate diffuse encephalopathy, nonspecific etiology - non epileptic jerking episodes - no seizures or epileptiform discharges Jerking has improved High dose thiamine given hx etoh abuse   Alcohol abuse- (present on admission) CIWA High dose thiamine with encephalopathy  Anemia Chronic, follow Normal B12, folate AOCD with iron def  Steroid-induced hyperglycemia a1c 4.8 Will stop SSI and BG checks, follow on daily labs   DVT prophylaxis: lovenox Code Status: DNR Family Communication: wife Disposition:   Status is: Observation  The patient will require care spanning > 2 midnights and should be  moved to inpatient because: need for evaluation of encephalopathy, rad eval, oncology eval, palliative care eval      Consultants:  Rad onc oncology  Procedures:  none  Antimicrobials:  Anti-infectives (From admission, onward)    None        Subjective: No new complaints Wife at bedside  Objective: Vitals:   02/23/21 2118 02/24/21 0427 02/24/21 0730 02/24/21 1550  BP: 107/74 93/60 100/60 99/71  Pulse: 70 79 69 71  Resp: '16 18 16 15  ' Temp: 98.1 F (36.7 C) 98.2 F (36.8 C) 99 F (37.2 C) 98 F (36.7 C)  TempSrc: Oral Oral Oral Oral  SpO2: 99% 100% 100% 99%  Weight:      Height:        Intake/Output Summary (Last 24 hours) at 02/24/2021 1619 Last data filed at 02/24/2021 0935 Gross per 24 hour  Intake 480 ml  Output 1200 ml  Net -720 ml   Filed Weights   02/21/21 1301  Weight: 69.1 kg    Examination:  General: No acute distress. Cardiovascular: RRR Lungs: unlabored Abdomen: Soft, nontender, nondistended  Neurological: Alert and oriented 3.  Extremities: No clubbing or cyanosis. No edema.     Data Reviewed: I have personally reviewed following labs and imaging studies  CBC: Recent Labs  Lab 02/21/21 1409 02/21/21 1606 02/21/21 1802 02/22/21 0439 02/23/21 0518 02/24/21 0505  WBC 3.6*  --  2.6* 2.2* 3.5* 2.6*  NEUTROABS 2.8  --   --   --  3.2 2.3  HGB 8.7* 8.5* 8.9* 9.3* 8.6* 8.8*  HCT 26.4* 25.0* 27.7* 28.0* 26.4* 27.2*  MCV 97.4  --  97.9 97.2 97.1 98.2  PLT 169  --  168 173 174 646    Basic Metabolic Panel: Recent Labs  Lab 02/21/21 1409 02/21/21 1606 02/21/21 1802 02/22/21 0439 02/23/21 0518 02/24/21 0505  NA 131* 136  --  133* 134* 132*  K 3.3* 3.5  --  3.6 3.5 3.2*  CL 101 103  --  102 103 101  CO2 25  --   --  '23 25 24  ' GLUCOSE 105* 101*  --  172* 137* 142*  BUN 8 3*  --  '8 9 10  ' CREATININE 0.48* 0.30* 0.35* 0.33* <0.30* 0.47*  CALCIUM 8.1*  --   --  8.7* 8.8* 8.8*  MG  --   --   --   --  2.0 1.9  PHOS  --   --   --   --  3.5 3.0    GFR: Estimated Creatinine Clearance: 116.4 mL/min (Damesha Lawler) (by C-G formula based on SCr of 0.47 mg/dL (L)).  Liver Function Tests: Recent Labs  Lab 02/21/21 1409 02/22/21 0439 02/23/21 0518 02/24/21 0505  AST 14* 13* 15 18   ALT '15 16 16 19  ' ALKPHOS 58 59 56 54  BILITOT 0.5 0.6 0.5 0.5  PROT 6.2* 6.8 6.6 6.2*  ALBUMIN 3.1* 3.1* 3.1* 3.0*    CBG: Recent Labs  Lab 02/22/21 1739 02/22/21 2200 02/23/21 0742 02/23/21 1214 02/23/21 1757  GLUCAP 172* 142* 142* 150* 155*     Recent Results (from the past 240 hour(s))  Resp Panel by RT-PCR (Flu Kristle Wesch&B, Covid) Nasopharyngeal Swab     Status: None   Collection Time: 02/21/21  6:31 PM   Specimen: Nasopharyngeal Swab; Nasopharyngeal(NP) swabs in vial transport medium  Result Value Ref Range Status   SARS Coronavirus 2 by RT PCR NEGATIVE NEGATIVE Final    Comment: (NOTE) SARS-CoV-2  target nucleic acids are NOT DETECTED.  The SARS-CoV-2 RNA is generally detectable in upper respiratory specimens during the acute phase of infection. The lowest concentration of SARS-CoV-2 viral copies this assay can detect is 138 copies/mL. Jaramie Bastos negative result does not preclude SARS-Cov-2 infection and should not be used as the sole basis for treatment or other patient management decisions. Ayeisha Lindenberger negative result may occur with  improper specimen collection/handling, submission of specimen other than nasopharyngeal swab, presence of viral mutation(s) within the areas targeted by this assay, and inadequate number of viral copies(<138 copies/mL). Allahna Husband negative result must be combined with clinical observations, patient history, and epidemiological information. The expected result is Negative.  Fact Sheet for Patients:  EntrepreneurPulse.com.au  Fact Sheet for Healthcare Providers:  IncredibleEmployment.be  This test is no t yet approved or cleared by the [place] FDA and  has been authorized for detection and/or diagnosis of SARS-CoV-2 by FDA under an Emergency Use Authorization (EUA). This EUA will remain  in effect (meaning this test can be used) for the duration of the COVID-19 declaration under Section 564(b)(1) of the Act,  21 U.S.C.section 360bbb-3(b)(1), unless the authorization is terminated  or revoked sooner.       Influenza Florencia Zaccaro by PCR NEGATIVE NEGATIVE Final   Influenza B by PCR NEGATIVE NEGATIVE Final    Comment: (NOTE) The Xpert Xpress SARS-CoV-2/FLU/RSV plus assay is intended as an aid in the diagnosis of influenza from Nasopharyngeal swab specimens and should not be used as Anh Bigos sole basis for treatment. Nasal washings and aspirates are unacceptable for Xpert Xpress SARS-CoV-2/FLU/RSV testing.  Fact Sheet for Patients: EntrepreneurPulse.com.au  Fact Sheet for Healthcare Providers: IncredibleEmployment.be  This test is not yet approved or cleared by the [place] FDA and has been authorized for detection and/or diagnosis of SARS-CoV-2 by FDA under an Emergency Use Authorization (EUA). This EUA will remain in effect (meaning this test can be used) for the duration of the COVID-19 declaration under Section 564(b)(1) of the Act, 21 U.S.C. section 360bbb-3(b)(1), unless the authorization is terminated or revoked.  Performed at [facility], [place] [address], [place], [place] [address]          Radiology Studies: No results found.      Scheduled Meds:  Chlorhexidine Gluconate Cloth  6 each Topical Daily   dexamethasone (DECADRON) injection  4 mg Intravenous Q6H   enoxaparin (LOVENOX) injection  40 mg Subcutaneous Y69S   folic acid  1 mg Oral Daily   gabapentin  300 mg Oral TID   LORazepam  0-4 mg Oral Q12H   megestrol  400 mg Oral Daily   multivitamin with minerals  1 tablet Oral Daily   oxyCODONE  20 mg Oral Q8H   pantoprazole  40 mg Oral BID AC   sodium chloride flush  10-40 mL Intracatheter Q12H   sucralfate  1 g Oral QID   [START ON 03/02/2021] thiamine  100 mg Oral Daily   traZODone  50 mg Oral QHS   Continuous Infusions:  thiamine injection 500 mg (02/24/21 1559)   Followed by   Derrill Memo ON 02/25/2021] thiamine  injection       LOS: 2 days    Time spent: over 30 min    Fayrene Helper, MD [facility]   To contact the attending provider between 7A-7P or the covering provider during after hours 7P-7A, please log into the web site [email or web address] and access using universal [place] password for that web site. If you do  not have the password, please call the hospital operator.  02/24/2021, 4:19 PM

## 2021-02-24 NOTE — Consult Note (Addendum)
Consultation Note Date: 02/24/2021   Patient Name: Jimmy Hawkins  DOB: 02-01-1975  MRN: [id number]  Age / Sex: 46 y.o., male  PCP: [name], MD Referring Physician: Elodia Florence., *  Reason for Consultation: Establishing goals of care and Pain control  HPI/Patient Profile: 46 y.o. male  with past medical history of alcohol abuse and metastatic esophageal cancer currently on palliative chemotherapy with Dr. Burr Medico admitted on 02/21/2021 with worsening hip pain related to large sacral metastatic lesion with extraosseous extension.  He had radiation to sacrum last February and plan is for further radiation therapy this admission.  Clinical Assessment and Goals of Care: I briefly saw Jimmy Hawkins last night in conjunction with Jimmy Rung, RN from our team who knows him from outpatient palliative clinic at the cancer center.  At that point, he noted his symptoms were better and he was out of pain.  We discussed plan for me to follow-up again today for initial consult.  Following that conversation, I discussed further with Dr. Florene Glen and he noted that patient was having myoclonic jerks prior to Dr. Florene Glen decreasing his MS Contin.  We therefore discussed that we would likely need to consider rotating him from [place] Contin to another long-acting agent, particularly if he has signs of jerking that progressed or continued.  I saw and examined Jimmy Hawkins today.  His sister and niece were in the room.  He speaks very limited english and requires spanish interpretor.  We discussed his current pain and options for pain management moving forward.  He reports that his pain has been better controlled since receiving IV Dilaudid.  We talked about plan overall of rotating from MS Contin to OxyContin due to the fact he is having potential side effect of the MS Contin.  While I am not sure that it is the cause of  the jerking, it is certainly appropriate to rotate to a different opioid to see if this improves.   We also discussed plan to initiate radiation therapy.  He denied any concerns or questions regarding this.  We briefly discussed long-term plan of focusing on interventions that are likely to help him feel better or add time and quality to his life while avoiding interventions that are not in line with this goal.  Discussed we will continue to progress conversation on how to best do this over the next several days in conjunction with his oncologist.  SUMMARY OF RECOMMENDATIONS   -DNR/DNI -Pain, cancer related: Continues to report pain has not been well controlled.  During my encounter with him I did also notice 2 occasions of myoclonic jerk.  With this in mind, we will rotate from MS Contin to OxyContin.  He has been receiving MS Contin 30 mg 3 times daily.  This is the equivalent of OxyContin 20 mg 3 times daily. While I would generally dose reduce for cross tolerance when changing from 1 opioid to another, he reports that his pain is not controlled and we will therefore change directly over  to OxyContin 20 mg 3 times daily.  We also discussed continuation of breakthrough medication of IV Dilaudid 1 mg every 3 hours as needed for severe pain. -Plan for initiation of radiation therapy.  Overall, this is the most likely way to get his pain under better control for the long-term. -Note Dr. Burr Medico has indicated consideration for home hospice when he completes radiation therapy.  We will continue to follow along to progress this conversation in conjunction with her over the next several days.  Code Status/Advance Care Planning: DNR   Symptom Management:  As above  Palliative Prophylaxis:  Bowel Regimen and Frequent Pain Assessment   Psycho-social/Spiritual:  Desire for further Chaplaincy support:no Additional Recommendations: Education on Hospice  Prognosis:  < 6 months most likely if his disease  follows its natural course.  Discharge Planning: To Be Determined      Primary Diagnoses: Present on Admission:  Metastatic cancer ([place])  Alcohol abuse  Bone metastases ([place])   I have reviewed the medical record, interviewed the patient and family, and examined the patient. The following aspects are pertinent.  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   metastatic   Social History   Socioeconomic History   Marital status: Single    Spouse name: Not on file   Number of children: Not on file   Years of education: Not on file   Highest education level: Not on file  Occupational History   Not on file  Tobacco Use   Smoking status: Never   Smokeless tobacco: Never  Vaping Use   Vaping Use: Never used  Substance and Sexual Activity   Alcohol use: Yes    Alcohol/week: 30.0 standard drinks    Types: 30 Cans of beer per week   Drug use: Never   Sexual activity: Not on file  Other Topics Concern   Not on file  Social History Narrative   Not on file   Social Determinants of Health   Financial Resource Strain: Not on file  Food Insecurity: Not on file  Transportation Needs: Not on file  Physical Activity: Not on file  Stress: Not on file  Social Connections: Not on file   Family History  Problem Relation Age of Onset   Esophageal cancer Mother    Stomach cancer Mother    Healthy Father    Colon cancer Neg Hx    Rectal cancer Neg Hx    Scheduled Meds:  acetaminophen  1,000 mg Oral Q8H   Chlorhexidine Gluconate Cloth  6 each Topical Daily   dexamethasone (DECADRON) injection  4 mg Intravenous Q6H   enoxaparin (LOVENOX) injection  40 mg Subcutaneous J00X   folic acid  1 mg Oral Daily   gabapentin  300 mg Oral TID   LORazepam  0-4 mg Oral Q12H   megestrol  400 mg Oral Daily   multivitamin  with minerals  1 tablet Oral Daily   oxyCODONE  20 mg Oral Q8H   pantoprazole  40 mg Oral BID AC   potassium chloride  40 mEq Oral Q4H   sodium chloride flush  10-40 mL Intracatheter Q12H   sucralfate  1 g Oral QID   [START ON 03/02/2021] thiamine  100 mg Oral Daily   traZODone  50 mg Oral QHS   Continuous Infusions:  thiamine injection 500  mg (02/24/21 5732)   Followed by   Derrill Memo ON 02/25/2021] thiamine injection     PRN Meds:.diphenhydrAMINE **AND** acetaminophen, acetaminophen **FOLLOWED BY** acetaminophen, HYDROmorphone (DILAUDID) injection, LORazepam, LORazepam **OR** LORazepam, polyethylene glycol, prochlorperazine Medications Prior to Admission:  Prior to Admission medications   Medication Sig Start Date End Date Taking? Authorizing Provider  dexamethasone (DECADRON) 4 MG tablet Take 1 tablet by mouth once a day WITH FOOD Patient taking differently: Take 4 mg by mouth daily. 02/10/21  Yes   diphenhydrAMINE-APAP, sleep, (TYLENOL PM EXTRA STRENGTH) 50-1000 MG/30ML LIQD Take 15 mLs by mouth at bedtime as needed (sleep).   Yes [provider]  ferrous sulfate 325 (65 FE) MG tablet TAKE 1 TABLET BY MOUTH 2 TIMES DAILY. Patient taking differently: Take 325 mg by mouth 2 (two) times daily. 05/18/20 05/18/21 Yes [name], MD  gabapentin (NEURONTIN) 300 MG capsule Take 1 capsule (300 mg total) by mouth 3 (three) times daily. 12/28/20  Yes Pickenpack-Cousar, Carlena Sax, NP  HYDROcodone-acetaminophen (HYCET) 7.5-325 mg/15 ml solution Take 10 mLs by mouth every 6  hours as needed for moderate pain. OK to refill on 02/05/2021 02/01/21 07/31/21 Yes [name], MD  HYDROmorphone (DILAUDID) 2 MG tablet Take 1 tablet (2 mg total) by mouth every 4 (four) hours as needed for severe pain. 02/15/21  Yes [name], MD  megestrol (MEGACE) 20 MG tablet Take 1 tablet by mouth 2 times daily. 01/25/21  Yes [name], MD  morphine (MS CONTIN) 60 MG 12 hr tablet Take 1 tablet (60 mg total) by mouth every 12 (twelve)  hours. 02/15/21  Yes [name], MD  pantoprazole (PROTONIX) 40 MG tablet Take 1 tablet (40 mg total) by mouth 2 (two) times daily before a meal. 10/12/20  Yes [name], MD  prochlorperazine (COMPAZINE) 10 MG tablet Take 1 tablet by mouth every 6 hours as needed for nausea or vomiting. 01/25/21 01/25/22 Yes [name], MD  senna-docusate (SENOKOT S) 8.6-50 MG tablet Take 2 tablet by mouth once a day Patient taking differently: Take 1 tablet by mouth daily. 02/10/21  Yes   sucralfate (CARAFATE) 1 g tablet Take 1 tablet by mouth 4 times daily. (Dissolve in 15 mls of water before taking) 09/06/20  Yes Levin Erp, PA  famotidine (PEPCID) 20 MG tablet TOME 1 PASTILLA POR VIA ORAL DOS VECES AL DIA 12/20/19 12/19/20  [name], MD  Ferrous Sulfate (IRON) 325 (65 Fe) MG TABS Take 1 tablet (325 mg total) by mouth 2 (two) times daily. 07/20/20 10/03/20  [name], MD  omeprazole (PRILOSEC) 40 MG capsule Take 1 capsule (40 mg total) by mouth daily. 09/12/19 11/28/19  [name], MD   No Known Allergies Review of Systems  Constitutional:  Positive for activity change and fatigue.  Gastrointestinal:  Positive for abdominal distention and abdominal pain.  Neurological:  Positive for weakness.  Psychiatric/Behavioral:  Positive for sleep disturbance.    Physical Exam General: Alert, awake, in no acute distress.   HEENT: No bruits, no goiter, no JVD Heart: Regular rate and rhythm. No murmur appreciated. Lungs: Good air movement, clear Abdomen: Soft, nontender, nondistended, positive bowel sounds.   Ext: No significant edema Skin: Warm and dry   Vital Signs: BP 100/60 (BP Location: Left Arm, Patient Position: Sitting)    Pulse 69    Temp 99 F (37.2 C) (Oral)    Resp 16    Ht 6' (1.829 m)    Wt 69.1 kg    SpO2 100%  BMI 20.67 kg/m  Pain Scale: 0-10   Pain Score: 7    SpO2: SpO2: 100 % O2 Device:SpO2: 100 % O2 Flow Rate: .   IO: Intake/output summary:  Intake/Output Summary (Last 24 hours)  at 02/24/2021 1005 Last data filed at 02/24/2021 0500 Gross per 24 hour  Intake 360 ml  Output 1200 ml  Net -840 ml    LBM: Last BM Date: 02/23/21 Baseline Weight: Weight: 69.1 kg Most recent weight: Weight: 69.1 kg     Palliative Assessment/Data:     Time In: 1600 Time Out: 1715 Time Total: 75 Greater than 50%  of this time was spent counseling and coordinating care related to the above assessment and plan.  Signed by: Micheline Rough, MD   Please contact Palliative Medicine Team phone at [phone] for questions and concerns.  For individual provider: See Shea Evans

## 2021-02-24 NOTE — Progress Notes (Signed)
Daily Progress Note   Patient Name: Jimmy Hawkins       Date: 02/24/2021 DOB: [birth year]  Age: 46 y.o. MRN#: [id number] Attending Physician: Elodia Florence., * Primary Care Physician: [name], MD Admit Date: 02/21/2021  Reason for Consultation/Follow-up: Establishing goals of care and Pain control  Subjective: I saw and examined [name].  He was lying in bed in no distress.  Family member at the bedside.  He does not speak Spanish and requires interpreter.  Reports pain is better controlled and when asked to quantify he said, "just a little pain."  He thinks current regimen of rotation to OxyContin and Dilaudid is working effectively.  Reviewed plan for radiation and hopefully this will improve pain long-term.  Also endorsed having periods of anxiety.  Discussed he has medication available for this (on CIWA scale).  Length of Stay: 2  Current Medications: Scheduled Meds:   Chlorhexidine Gluconate Cloth  6 each Topical Daily   dexamethasone (DECADRON) injection  4 mg Intravenous Q6H   enoxaparin (LOVENOX) injection  40 mg Subcutaneous W09W   folic acid  1 mg Oral Daily   gabapentin  300 mg Oral TID   [START ON 02/25/2021] influenza vac split quadrivalent PF  0.5 mL Intramuscular Tomorrow-1000   LORazepam  0-4 mg Oral Q12H   megestrol  400 mg Oral Daily   multivitamin with minerals  1 tablet Oral Daily   oxyCODONE  20 mg Oral Q8H   pantoprazole  40 mg Oral BID AC   sodium chloride flush  10-40 mL Intracatheter Q12H   sucralfate  1 g Oral QID   [START ON 03/02/2021] thiamine  100 mg Oral Daily   traZODone  50 mg Oral QHS    Continuous Infusions:  thiamine injection 500 mg (02/24/21 0937)   Followed by   Derrill Memo ON 02/25/2021] thiamine injection       PRN Meds: diphenhydrAMINE **AND** acetaminophen, [COMPLETED] acetaminophen **FOLLOWED BY** acetaminophen, HYDROmorphone (DILAUDID) injection, LORazepam, LORazepam **OR** LORazepam, polyethylene glycol, prochlorperazine  Physical Exam         General: Alert, awake, in no acute distress.   HEENT: No bruits, no goiter, no JVD Heart: Regular rate and rhythm. No murmur appreciated. Lungs: Good air movement, clear Abdomen: Soft, nontender, nondistended, positive bowel sounds.   Ext: No significant  edema Skin: Warm and dry  Vital Signs: BP 100/60 (BP Location: Left Arm, Patient Position: Sitting)    Pulse 69    Temp 99 F (37.2 C) (Oral)    Resp 16    Ht 6' (1.829 m)    Wt 69.1 kg    SpO2 100%    BMI 20.67 kg/m  SpO2: SpO2: 100 % O2 Device: O2 Device: Room Air O2 Flow Rate:    Intake/output summary:  Intake/Output Summary (Last 24 hours) at 02/24/2021 1310 Last data filed at 02/24/2021 0935 Gross per 24 hour  Intake 480 ml  Output 1200 ml  Net -720 ml   LBM: Last BM Date: 02/23/21 Baseline Weight: Weight: 69.1 kg Most recent weight: Weight: 69.1 kg       Palliative Assessment/Data:      Patient Active Problem List   Diagnosis Date Noted   Acute metabolic encephalopathy 56/43/3295   Myoclonic jerking 02/22/2021   Steroid-induced hyperglycemia 02/22/2021   Metastatic cancer ([place]) 02/21/2021   Anemia 02/21/2021   Lower extremity weakness 02/21/2021   Difficulty urinating 02/21/2021   DNR (do not resuscitate) 11/10/2020   Esophageal stricture 09/19/2020   Drug-induced neutropenia ([place]) 08/10/2020   Cancer related pain 03/22/2020   Bone metastases ([place]) 03/22/2020   Iron deficiency anemia 08/12/2019   Upper GI bleed 08/11/2019   Malignant neoplasm of esophagus (HCC)    Hematemesis with nausea 08/10/2019   Syncope, vasovagal 08/10/2019   Acute blood loss anemia 08/10/2019   Alcohol abuse     Palliative Care Assessment & Plan   Patient Profile: 46 y.o. 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 Burr Medico admitted on 02/21/2021 with worsening hip pain related to large sacral metastatic lesion with extraosseous extension.  He had radiation to sacrum last February and plan is for further radiation therapy this admission.  Recommendations/Plan: Pain, cancer related: MAR reviewed and he has used 4 doses of IV Dilaudid with an oral morphine equivalent of 80 mg in the last 24 hours.  Additionally, he is now on OxyContin 20 mg 3 times daily.  There is reported improvement in his myoclonic jerks with his rotation.  We will need to continue to monitor closely.  Overall, this is difficult to treat pain secondary to bony metastasis and would recommend continuation of steroids as well as plan for radiation therapy. Anxiety/withdrawal: Continue CIWA. Note Dr. Burr Medico has indicated consideration for home hospice when he completes radiation therapy.  We will continue to follow along to progress this conversation in conjunction with her over the next several days  Code Status:    Code Status Orders  (From admission, onward)           Start     Ordered   02/21/21 1802  Do not attempt resuscitation (DNR)  Continuous       Question Answer Comment  In the event of cardiac or respiratory ARREST Do not call a code blue   In the event of cardiac or respiratory ARREST Do not perform Intubation, CPR, defibrillation or ACLS   In the event of cardiac or respiratory ARREST Use medication by any route, position, wound care, and other measures to relive pain and suffering. May use oxygen, suction and manual treatment of airway obstruction as needed for comfort.      02/21/21 1801           Code Status History     Date  Active Date Inactive Code Status Order ID Comments User Context   12/28/2020 1514 02/21/2021 1238 DNR [id number]  Jimmy Footman, NP Outpatient   08/10/2019 1546  08/12/2019 1945 Full Code [id number]  Darliss Cheney, MD ED      Advance Directive Documentation    Flowsheet Row Most Recent Value  Type of Advance Directive Healthcare Power of Attorney, Living will  Pre-existing out of facility DNR order (yellow form or pink MOST form) --  "MOST" Form in Place? --       Prognosis:  < 6 months  Discharge Planning: To Be Determined  Care plan was discussed with patient  Thank you for allowing the Palliative Medicine Team to assist in the care of this patient.  Micheline Rough, MD  Please contact Palliative Medicine Team phone at [phone] for questions and concerns.

## 2021-02-25 ENCOUNTER — Encounter: Payer: Self-pay | Admitting: Physician Assistant

## 2021-02-25 ENCOUNTER — Other Ambulatory Visit (HOSPITAL_COMMUNITY): Payer: Self-pay

## 2021-02-25 ENCOUNTER — Encounter: Payer: Self-pay | Admitting: Hematology

## 2021-02-25 LAB — CBC WITH DIFFERENTIAL/PLATELET
Abs Immature Granulocytes: 0.02 10*3/uL (ref 0.00–0.07)
Basophils Absolute: 0 10*3/uL (ref 0.0–0.1)
Basophils Relative: 0 %
Eosinophils Absolute: 0 10*3/uL (ref 0.0–0.5)
Eosinophils Relative: 0 %
HCT: 27.3 % — ABNORMAL LOW (ref 39.0–52.0)
Hemoglobin: 9 g/dL — ABNORMAL LOW (ref 13.0–17.0)
Immature Granulocytes: 1 %
Lymphocytes Relative: 7 %
Lymphs Abs: 0.2 10*3/uL — ABNORMAL LOW (ref 0.7–4.0)
MCH: 32.1 pg (ref 26.0–34.0)
MCHC: 33 g/dL (ref 30.0–36.0)
MCV: 97.5 fL (ref 80.0–100.0)
Monocytes Absolute: 0.1 10*3/uL (ref 0.1–1.0)
Monocytes Relative: 6 %
Neutro Abs: 2.1 10*3/uL (ref 1.7–7.7)
Neutrophils Relative %: 86 %
Platelets: 193 10*3/uL (ref 150–400)
RBC: 2.8 MIL/uL — ABNORMAL LOW (ref 4.22–5.81)
RDW: 14.6 % (ref 11.5–15.5)
WBC: 2.4 10*3/uL — ABNORMAL LOW (ref 4.0–10.5)
nRBC: 0 % (ref 0.0–0.2)

## 2021-02-25 LAB — COMPREHENSIVE METABOLIC PANEL
ALT: 22 U/L (ref 0–44)
AST: 18 U/L (ref 15–41)
Albumin: 3 g/dL — ABNORMAL LOW (ref 3.5–5.0)
Alkaline Phosphatase: 51 U/L (ref 38–126)
Anion gap: 6 (ref 5–15)
BUN: 11 mg/dL (ref 6–20)
CO2: 24 mmol/L (ref 22–32)
Calcium: 8.9 mg/dL (ref 8.9–10.3)
Chloride: 102 mmol/L (ref 98–111)
Creatinine, Ser: 0.39 mg/dL — ABNORMAL LOW (ref 0.61–1.24)
GFR, Estimated: >60 mL/min (ref >60)
Glucose, Bld: 111 mg/dL — ABNORMAL HIGH (ref 70–99)
Potassium: 4 mmol/L (ref 3.5–5.1)
Sodium: 132 mmol/L — ABNORMAL LOW (ref 135–145)
Total Bilirubin: 0.5 mg/dL (ref 0.3–1.2)
Total Protein: 6.1 g/dL — ABNORMAL LOW (ref 6.5–8.1)

## 2021-02-25 LAB — URINALYSIS, COMPLETE (UACMP) WITH MICROSCOPIC
Bacteria, UA: NONE SEEN
Bilirubin Urine: NEGATIVE
Glucose, UA: NEGATIVE mg/dL
Hgb urine dipstick: NEGATIVE
Ketones, ur: NEGATIVE mg/dL
Leukocytes,Ua: NEGATIVE
Nitrite: NEGATIVE
Protein, ur: NEGATIVE mg/dL
Specific Gravity, Urine: 1.026 (ref 1.005–1.030)
pH: 6 (ref 5.0–8.0)

## 2021-02-25 LAB — MAGNESIUM: Magnesium: 2.1 mg/dL (ref 1.7–2.4)

## 2021-02-25 LAB — PHOSPHORUS: Phosphorus: 3.6 mg/dL (ref 2.5–4.6)

## 2021-02-25 MED ORDER — ENOXAPARIN SODIUM 40 MG/0.4ML IJ SOSY
40.0000 mg | PREFILLED_SYRINGE | INTRAMUSCULAR | Status: DC
  Administered 2021-02-25 – 2021-03-09 (×13): 40 mg via SUBCUTANEOUS
  Filled 2021-02-25 (×13): qty 0.4

## 2021-02-25 NOTE — Plan of Care (Signed)

## 2021-02-25 NOTE — Progress Notes (Signed)
PROGRESS NOTE    Jimmy Hawkins  RXV:[id number] DOB: 1975-04-14 DOA: 02/21/2021 PCP: [name], MD  No chief complaint on file.   Brief Narrative:  46 yo with hx etoh abuse and metastatic esophageal cancer currently on palliative chemotherapy with Dr. Burr Medico.  He called Dr. Burr Medico on the day of admission with worsening hip pain and he was directed to the ED.  He recently saw Dr. Burr Medico on 1/20 and they had discussion regarding his treatment options and decided on plan to try irinotecan.  He had significant pain and his MS contin dose was adjusted and hydrocodone was transitioned to dilaudid.  He presents to the ED with progressive pain.  Also notes urinary issues and lower extremity weakness.  He drinks significantly, 5 beers daily, but denies any hx of withdrawal.  Denies other symptoms like fevers, chills, chest pain, shortness of breath.      Assessment & Plan:   Principal Problem:   Metastatic cancer ([place]) Active Problems:   Malignant neoplasm of esophagus (HCC)   Cancer related pain   Bone metastases (HCC)   Lower extremity weakness   Difficulty urinating   Alcohol abuse   Acute metabolic encephalopathy   Myoclonic jerking   Anemia   Steroid-induced hyperglycemia   Assessment and Plan: Malignant neoplasm of esophagus ([place]) Followed by Dr. Burr Medico Recently had discussion regarding hospice vs continued treatment and decided to continue chemo.  Started on irinotecan on 1/20.   Appreciate her assistance Likely to transition to [place] after completion of Radiation Therapy per Dr. Burr Medico note from 1/27  Bone metastases [facility])- (present on admission) CT with large sacral metastatic lesion 8.6x6.1 with extraosseous extension He's s/p palliative radiation to sacrum 03/26/20-04/10/20 and short course of dex PET 1/17 with increase in size of metastatic lesion in L hemisacrum MRI sacrum with large met with central necrosis, extensive involvement of S1, S2, and S3 segments with  lesser involvement of upper S4.  Lesion is more extensive to L of midline, but involves all sacral foramina.  Tumor fills sacral spinal canal and extends up to L5-S1 disc level.  Extends into scaral hiatus on the L.  Pathologic fx at S2.  Grossing of L SI joint with involvement of iliac. Plan for Radiation Therapy to start 02/26/2021  Cancer related pain Transitioned to oxycontin 20 mg TID per palliative care Continue IV dilaudid prn Dilaudid prn Scheduled APAP Steroids Palliative care for assistance with pain - appreciate assistance Continuous pulse ox  Difficulty urinating Likely related to metastatic disease Follow MRI as above UA, bladder scan  Lower extremity weakness Likely related to mets, discussed with rads, will follow MRI sacrum (as above) Start dexamethasone Radiation oncology c/s as above PT/OT He's got improved strength on exam today, still L>R LE weakness  Myoclonic jerking Related to pain meds Improved with transition to oxycodone  Acute metabolic encephalopathy Suspect related to pain meds This is improved today Ammonia wnl, b12 elevated, folate wnl, VBG without hypercarbia EEG with moderate diffuse encephalopathy, nonspecific etiology - non epileptic jerking episodes - no seizures or epileptiform discharges Jerking has improved High dose thiamine given hx etoh abuse   Alcohol abuse- (present on admission) CIWA High dose thiamine with encephalopathy  Anemia Chronic, follow Normal B12, folate AOCD with iron def  Steroid-induced hyperglycemia a1c 4.8 Will stop SSI and BG checks, follow on daily labs   DVT prophylaxis: lovenox Code Status: DNR Family Communication: wife Disposition:   Status is: Observation  The patient will require  care spanning > 2 midnights and should be moved to inpatient because: need for evaluation of encephalopathy, rad eval, oncology eval, palliative care eval      Consultants:  Rad onc oncology  Procedures:   none  Antimicrobials:  Anti-infectives (From admission, onward)    None       Subjective: No new complaints  Objective: Vitals:   02/25/21 0800 02/25/21 1051 02/25/21 1355 02/25/21 1400  BP: 96/68  96/70 96/70  Pulse: 63  76 76  Resp:   18   Temp:   98.2 F (36.8 C)   TempSrc:   Oral   SpO2:  99% 100%   Weight:      Height:        Intake/Output Summary (Last 24 hours) at 02/25/2021 2020 Last data filed at 02/25/2021 1835 Gross per 24 hour  Intake 370 ml  Output 2740 ml  Net -2370 ml   Filed Weights   02/21/21 1301  Weight: 69.1 kg    Examination:  General: No acute distress. Cardiovascular: RRR Lungs: unlabored Abdomen: Soft, nontender, nondistended  Neurological: previously, only antigravity to RLE, now antigravity and 4/5 strength to R>L LE - still weakness with dorsiflexion and plantarflexion on L, foot drop on L Skin: Warm and dry. No rashes or lesions. Extremities: No clubbing or cyanosis. No edema.  Data Reviewed: I have personally reviewed following labs and imaging studies  CBC: Recent Labs  Lab 02/21/21 1409 02/21/21 1606 02/21/21 1802 02/22/21 0439 02/23/21 0518 02/24/21 0505 02/25/21 0425  WBC 3.6*  --  2.6* 2.2* 3.5* 2.6* 2.4*  NEUTROABS 2.8  --   --   --  3.2 2.3 2.1  HGB 8.7*   < > 8.9* 9.3* 8.6* 8.8* 9.0*  HCT 26.4*   < > 27.7* 28.0* 26.4* 27.2* 27.3*  MCV 97.4  --  97.9 97.2 97.1 98.2 97.5  PLT 169  --  168 173 174 177 193   < > = values in this interval not displayed.    Basic Metabolic Panel: Recent Labs  Lab 02/21/21 1409 02/21/21 1606 02/21/21 1802 02/22/21 0439 02/23/21 0518 02/24/21 0505 02/25/21 0425  NA 131* 136  --  133* 134* 132* 132*  K 3.3* 3.5  --  3.6 3.5 3.2* 4.0  CL 101 103  --  102 103 101 102  CO2 25  --   --  '23 25 24 24  ' GLUCOSE 105* 101*  --  172* 137* 142* 111*  BUN 8 3*  --  '8 9 10 11  ' CREATININE 0.48* 0.30* 0.35* 0.33* <0.30* 0.47* 0.39*  CALCIUM 8.1*  --   --  8.7* 8.8* 8.8* 8.9  MG  --    --   --   --  2.0 1.9 2.1  PHOS  --   --   --   --  3.5 3.0 3.6    GFR: Estimated Creatinine Clearance: 116.4 mL/min (Lelon Ikard) (by C-G formula based on SCr of 0.39 mg/dL (L)).  Liver Function Tests: Recent Labs  Lab 02/21/21 1409 02/22/21 0439 02/23/21 0518 02/24/21 0505 02/25/21 0425  AST 14* 13* '15 18 18  ' ALT '15 16 16 19 22  ' ALKPHOS 58 59 56 54 51  BILITOT 0.5 0.6 0.5 0.5 0.5  PROT 6.2* 6.8 6.6 6.2* 6.1*  ALBUMIN 3.1* 3.1* 3.1* 3.0* 3.0*    CBG: Recent Labs  Lab 02/22/21 1739 02/22/21 2200 02/23/21 0742 02/23/21 1214 02/23/21 1757  GLUCAP 172* 142* 142* 150* 155*  Recent Results (from the past 240 hour(s))  Resp Panel by RT-PCR (Flu Milliani Herrada&B, Covid) Nasopharyngeal Swab     Status: None   Collection Time: 02/21/21  6:31 PM   Specimen: Nasopharyngeal Swab; Nasopharyngeal(NP) swabs in vial transport medium  Result Value Ref Range Status   SARS Coronavirus 2 by RT PCR NEGATIVE NEGATIVE Final    Comment: (NOTE) SARS-CoV-2 target nucleic acids are NOT DETECTED.  The SARS-CoV-2 RNA is generally detectable in upper respiratory specimens during the acute phase of infection. The lowest concentration of SARS-CoV-2 viral copies this assay can detect is 138 copies/mL. Adian Jablonowski negative result does not preclude SARS-Cov-2 infection and should not be used as the sole basis for treatment or other patient management decisions. Brydon Spahr negative result may occur with  improper specimen collection/handling, submission of specimen other than nasopharyngeal swab, presence of viral mutation(s) within the areas targeted by this assay, and inadequate number of viral copies(<138 copies/mL). Lynwood Kubisiak negative result must be combined with clinical observations, patient history, and epidemiological information. The expected result is Negative.  Fact Sheet for Patients:  EntrepreneurPulse.com.au  Fact Sheet for Healthcare Providers:  IncredibleEmployment.be  This test is no  t yet approved or cleared by the [place] FDA and  has been authorized for detection and/or diagnosis of SARS-CoV-2 by FDA under an Emergency Use Authorization (EUA). This EUA will remain  in effect (meaning this test can be used) for the duration of the COVID-19 declaration under Section 564(b)(1) of the Act, 21 U.S.C.section 360bbb-3(b)(1), unless the authorization is terminated  or revoked sooner.       Influenza Camdin Hegner by PCR NEGATIVE NEGATIVE Final   Influenza B by PCR NEGATIVE NEGATIVE Final    Comment: (NOTE) The Xpert Xpress SARS-CoV-2/FLU/RSV plus assay is intended as an aid in the diagnosis of influenza from Nasopharyngeal swab specimens and should not be used as Tationna Fullard sole basis for treatment. Nasal washings and aspirates are unacceptable for Xpert Xpress SARS-CoV-2/FLU/RSV testing.  Fact Sheet for Patients: EntrepreneurPulse.com.au  Fact Sheet for Healthcare Providers: IncredibleEmployment.be  This test is not yet approved or cleared by the [place] FDA and has been authorized for detection and/or diagnosis of SARS-CoV-2 by FDA under an Emergency Use Authorization (EUA). This EUA will remain in effect (meaning this test can be used) for the duration of the COVID-19 declaration under Section 564(b)(1) of the Act, 21 U.S.C. section 360bbb-3(b)(1), unless the authorization is terminated or revoked.  Performed at [facility], [place] [address] [place], [place] [address]          Radiology Studies: No results found.      Scheduled Meds:  Chlorhexidine Gluconate Cloth  6 each Topical Daily   dexamethasone (DECADRON) injection  4 mg Intravenous T2I   folic acid  1 mg Oral Daily   gabapentin  300 mg Oral TID   LORazepam  0-4 mg Oral Q12H   megestrol  400 mg Oral Daily   multivitamin with minerals  1 tablet Oral Daily   oxyCODONE  20 mg Oral Q8H   pantoprazole  40 mg Oral BID AC   sodium chloride flush   10-40 mL Intracatheter Q12H   sucralfate  1 g Oral QID   [START ON 03/02/2021] thiamine  100 mg Oral Daily   traZODone  50 mg Oral QHS   Continuous Infusions:  thiamine injection Stopped (02/25/21 1002)     LOS: 3 days    Time spent: over 30 min    Fayrene Helper,  MD [facility]   To contact the attending provider between 7A-7P or the covering provider during after hours 7P-7A, please log into the web site [email or web address] and access using universal [place] password for that web site. If you do not have the password, please call the hospital operator.  02/25/2021, 8:20 PM

## 2021-02-25 NOTE — Progress Notes (Signed)
Physical Therapy Treatment Patient Details Name: Jimmy Hawkins MRN: [id number] DOB: 12-09-1975 Today's Date: 02/25/2021   History of Present Illness 46 yo with hx etoh abuse and metastatic esophageal cancer currently on palliative chemotherapy    PT Comments    General Comments: AxO x 3 very pleasant Spanish speaking gentleman.  Spouse and daughter in room during session.  Daughter assisted with language inter. Pt found incont urine in bed.  Assisted OOB to bathroom for hygiene then amb in hallway.  General bed mobility comments: pt self able, assist for lines/covers.  Slightly impulsive. General transfer comment: assist to steady.  Pt impulsive and present with poor balance and decreased WBing thru R LE dur to hip pain. General Gait Details: decreased amb distance due to increased c/o R hip pain.  Spouse and daughter assisted with amb using a walker for stability.  HIGH FALL RISK. Returned to room in recliner and positioned to comfort.  Pt plans to D/C to Home with Hospice.   Recommendations for follow up therapy are one component of a multi-disciplinary discharge planning process, led by the attending physician.  Recommendations may be updated based on patient status, additional functional criteria and insurance authorization.  Follow Up Recommendations  Home health PT     Assistance Recommended at Discharge    Patient can return home with the following A little help with walking and/or transfers;A little help with bathing/dressing/bathroom;Assistance with cooking/housework;Assist for transportation;Help with stairs or ramp for entrance   Equipment Recommendations  None recommended by PT    Recommendations for Other Services       Precautions / Restrictions Precautions Precautions: Fall Restrictions Weight Bearing Restrictions: No     Mobility  Bed Mobility Overal bed mobility: Needs Assistance Bed Mobility: Supine to Sit     Supine to sit: Supervision      General bed mobility comments: pt self able, assist for lines/covers.  Slightly impulsive.    Transfers Overall transfer level: Needs assistance Equipment used: Rolling walker (2 wheels), None Transfers: Sit to/from Stand, Bed to chair/wheelchair/BSC Sit to Stand: Supervision, Min guard           General transfer comment: assist to steady.  Pt impulsive and present with poor balance and decreased WBing thru R LE dur to hip pain.    Ambulation/Gait Ambulation/Gait assistance: Min guard, Min assist Gait Distance (Feet): 65 Feet Assistive device: Rolling walker (2 wheels) Gait Pattern/deviations: Step-to pattern, Step-through pattern, Decreased step length - right, Decreased step length - left, Shuffle, Trunk flexed Gait velocity: decr     General Gait Details: decreased amb distance due to increased c/o R hip pain.  Spouse and daughter assisted with amb using a walker for stability.  HIGH FALL RISK.   Stairs             Wheelchair Mobility    Modified Rankin (Stroke Patients Only)       Balance                                            Cognition Arousal/Alertness: Awake/alert Behavior During Therapy: WFL for tasks assessed/performed Overall Cognitive Status: Within Functional Limits for tasks assessed                                 General Comments: AxO x 3  very pleasant Spanish speaking gentleman.  Spouse and daughter in room during session.  Daughter assisted with language inter.        Exercises      General Comments        Pertinent Vitals/Pain Pain Assessment Pain Assessment: Faces Faces Pain Scale: Hurts even more Pain Location: R hip Pain Descriptors / Indicators: Discomfort, Grimacing, Aching Pain Intervention(s): Monitored during session, Patient requesting pain meds-RN notified, Repositioned    Home Living                          Prior Function            PT Goals (current goals can  now be found in the care plan section) Progress towards PT goals: Progressing toward goals    Frequency    Min 3X/week      PT Plan Current plan remains appropriate    Co-evaluation              AM-PAC PT "6 Clicks" Mobility   Outcome Measure  Help needed turning from your back to your side while in a flat bed without using bedrails?: A Little Help needed moving from lying on your back to sitting on the side of a flat bed without using bedrails?: A Little Help needed moving to and from a bed to a chair (including a wheelchair)?: A Little Help needed standing up from a chair using your arms (e.g., wheelchair or bedside chair)?: A Little Help needed to walk in hospital room?: A Lot Help needed climbing 3-5 steps with a railing? : A Lot 6 Click Score: 16    End of Session Equipment Utilized During Treatment: Gait belt Activity Tolerance: Patient limited by pain;Patient limited by fatigue Patient left: with call bell/phone within reach;in chair;with family/visitor present;with chair alarm set Nurse Communication: Mobility status PT Visit Diagnosis: Muscle weakness (generalized) (M62.81);Unsteadiness on feet (R26.81);Difficulty in walking, not elsewhere classified (R26.2);Pain Pain - Right/Left: Right Pain - part of body: Hip     Time: 1200-1225 PT Time Calculation (min) (ACUTE ONLY): 25 min  Charges:  $Gait Training: 8-22 mins $Therapeutic Activity: 8-22 mins                     Rica Koyanagi  PTA Acute  Rehabilitation Services Pager      [phone] Office      [phone]

## 2021-02-26 ENCOUNTER — Ambulatory Visit
Admit: 2021-02-26 | Discharge: 2021-02-26 | Disposition: A | Discharge status: Home / Self Care | Payer: Self-pay | Attending: Radiation Oncology | Admitting: Radiation Oncology

## 2021-02-26 DIAGNOSIS — Z66 Do not resuscitate: Secondary | ICD-10-CM

## 2021-02-26 MED ORDER — HYDROMORPHONE HCL 2 MG PO TABS
2.0000 mg | ORAL_TABLET | ORAL | Status: DC | PRN
  Administered 2021-02-27 – 2021-02-28 (×2): 4 mg via ORAL
  Administered 2021-03-02: 2 mg via ORAL
  Administered 2021-03-02: 4 mg via ORAL
  Administered 2021-03-04 (×2): 2 mg via ORAL
  Administered 2021-03-05 – 2021-03-06 (×4): 4 mg via ORAL
  Administered 2021-03-06 (×2): 2 mg via ORAL
  Administered 2021-03-06 – 2021-03-07 (×5): 4 mg via ORAL
  Filled 2021-02-26 (×2): qty 1
  Filled 2021-02-26 (×8): qty 2
  Filled 2021-02-26: qty 1
  Filled 2021-02-26: qty 2
  Filled 2021-02-26 (×2): qty 1
  Filled 2021-02-26 (×4): qty 2

## 2021-02-26 MED ORDER — HYDROMORPHONE HCL 1 MG/ML IJ SOLN
1.0000 mg | INTRAMUSCULAR | Status: DC | PRN
  Administered 2021-02-26 – 2021-03-01 (×19): 1 mg via INTRAVENOUS
  Filled 2021-02-26 (×19): qty 1

## 2021-02-26 NOTE — Progress Notes (Signed)
Daily Progress Note   Patient Name: Jimmy Hawkins       Date: 02/26/2021 DOB: [birth year]  Age: 46 y.o. MRN#: [id number] Attending Physician: Elodia Florence., * Primary Care Physician: [name], MD Admit Date: 02/21/2021  Reason for Consultation/Follow-up: Establishing goals of care and Pain control  Subjective: Patient examined at the bedside. He reports he just got back in bed from having a bowel movement. He is alert and oriented x4 with use of spanish interpretation. Appears somewhat somnolent and observed intermittently falling asleep during conversations. He reports his pain is much better and is inquiring about going home today or tomorrow. Advised we would need a good plan once he is discharged and with him undergoing radiation the concerns with transportation. He has family at the bedside (wife, sister, and daughter). They expressed they would like to take patient home given his request but only when appropriately advised by the medical team.   We discussed his daily radiation and ability to return via support from family if he was to discharge home. Family is asking if there is help available to get him in the car daily as they live on upstairs level. Advised we would not be able to secure this type of daily assistance. Patient and family verbalized understanding expressing they would all help out and could make sure he gets to his appointments. They understand the medical team will continue to evaluate daily.   Length of Stay: 4  Current Medications: Scheduled Meds:   Chlorhexidine Gluconate Cloth  6 each Topical Daily   dexamethasone (DECADRON) injection  4 mg Intravenous Q6H   enoxaparin (LOVENOX) injection  40 mg Subcutaneous E33I   folic acid  1 mg Oral Daily    gabapentin  300 mg Oral TID   megestrol  400 mg Oral Daily   multivitamin with minerals  1 tablet Oral Daily   oxyCODONE  20 mg Oral Q8H   pantoprazole  40 mg Oral BID AC   sodium chloride flush  10-40 mL Intracatheter Q12H   sucralfate  1 g Oral QID   [START ON 03/02/2021] thiamine  100 mg Oral Daily   traZODone  50 mg Oral QHS    Continuous Infusions:  thiamine injection 250 mg (02/26/21 1005)    PRN Meds: diphenhydrAMINE **AND** acetaminophen, [COMPLETED] acetaminophen **  FOLLOWED BY** acetaminophen, HYDROmorphone (DILAUDID) injection, LORazepam, polyethylene glycol, prochlorperazine  Physical Exam         General: Alert, awake, in no acute distress.   Heart: Regular rate and rhythm. No murmur appreciated. Lungs: normal breathing pattern  Abdomen: Soft, nontender, nondistended, positive bowel sounds.   Ext: No significant edema Skin: Warm and dry Neuro: AAO x4 with interpretation   Vital Signs: BP 96/67 (BP Location: Left Arm)    Pulse 66    Temp 98.2 F (36.8 C) (Oral)    Resp 18    Ht 6' (1.829 m)    Wt 69.1 kg    SpO2 100%    BMI 20.67 kg/m  SpO2: SpO2: 100 % O2 Device: O2 Device: Room Air O2 Flow Rate:    Intake/output summary:  Intake/Output Summary (Last 24 hours) at 02/26/2021 1606 Last data filed at 02/26/2021 0745 Gross per 24 hour  Intake --  Output 1650 ml  Net -1650 ml    LBM: Last BM Date: 02/22/21 Baseline Weight: Weight: 69.1 kg Most recent weight: Weight: 69.1 kg       Palliative Assessment/Data:      Patient Active Problem List   Diagnosis Date Noted   Acute metabolic encephalopathy 23/55/7322   Myoclonic jerking 02/22/2021   Steroid-induced hyperglycemia 02/22/2021   Metastatic cancer ([place]) 02/21/2021   Anemia 02/21/2021   Lower extremity weakness 02/21/2021   Difficulty urinating 02/21/2021   DNR (do not resuscitate) 11/10/2020   Esophageal stricture 09/19/2020   Drug-induced neutropenia ([place]) 08/10/2020   Cancer related pain  03/22/2020   Bone metastases ([place]) 03/22/2020   Iron deficiency anemia 08/12/2019   Upper GI bleed 08/11/2019   Malignant neoplasm of esophagus (HCC)    Hematemesis with nausea 08/10/2019   Syncope, vasovagal 08/10/2019   Acute blood loss anemia 08/10/2019   Alcohol abuse     Palliative Care Assessment & Plan   Patient Profile: 46 y.o. male  with past medical history of alcohol abuse and metastatic esophageal cancer currently on palliative chemotherapy with Dr. Burr Medico admitted on 02/21/2021 with worsening hip pain related to large sacral metastatic lesion with extraosseous extension.  He had radiation to sacrum last February and plan is for further radiation therapy this admission.  Recommendations/Plan: Pain, cancer related: MAR reviewed and he has used 7 doses of IV dilaudid. Continue OxyContin 20 mg 3 times daily. Of note patient does not have health insurance. Once he is discharged home he most likely will not be able to afford OxyContin out of pocket. Given myoclonic jerking on admission when taking MS Contin, may need to consider transitioning to methadone for more financial feasibility. Will closely monitor. If patient discharges with hospice support may continue current regimen.   Overall, this is difficult to treat pain secondary to bony metastasis and would recommend continuation of steroids as well as plan for radiation therapy. Continue gabapentin  Prior to admission patient was taking oral dilaudid every 3hrs as needed. Will re-order dilaudid 2-4 mg every 3 hours PRN in anticipation of discharging home on effective regimen. Will evaluate IV usage over the next 24 hrs and make adjustments as necessary.  Anxiety/withdrawal: Continue CIWA. Note Dr. Burr Medico has indicated consideration for home hospice when he completes radiation therapy.  We will continue to follow along to progress this conversation in conjunction with her over the next several days.   Code Status:    Code Status Orders   (From admission, onward)  Start     Ordered   02/21/21 1802  Do not attempt resuscitation (DNR)  Continuous       Question Answer Comment  In the event of cardiac or respiratory ARREST Do not call a code blue   In the event of cardiac or respiratory ARREST Do not perform Intubation, CPR, defibrillation or ACLS   In the event of cardiac or respiratory ARREST Use medication by any route, position, wound care, and other measures to relive pain and suffering. May use oxygen, suction and manual treatment of airway obstruction as needed for comfort.      02/21/21 1801           Code Status History     Date Active Date Inactive Code Status Order ID Comments User Context   12/28/2020 1514 02/21/2021 1238 DNR [id number]  Jimmy Footman, NP Outpatient   08/10/2019 1546 08/12/2019 1945 Full Code [id number]  Darliss Cheney, MD ED      Advance Directive Documentation    Flowsheet Row Most Recent Value  Type of Advance Directive Healthcare Power of Attorney, Living will  Pre-existing out of facility DNR order (yellow form or pink MOST form) --  "MOST" Form in Place? --       Prognosis:  < 6 months  Discharge Planning: To Be Determined  Care plan was discussed with patient and his family.   Thank you for allowing the Palliative Medicine Team to assist in the care of this patient.  Time Total: 45 min.   Visit consisted of counseling and education dealing with the complex and emotionally intense issues of symptom management and palliative care in the setting of serious and potentially life-threatening illness.Greater than 50%  of this time was spent counseling and coordinating care related to the above assessment and plan.  Alda Lea, AGPCNP-BC  Palliative Medicine Team [phone]

## 2021-02-26 NOTE — Progress Notes (Signed)
Daily Progress Note   Patient Name: Jimmy [facility]       Date: 02/26/2021 DOB: Jun 29, 1975  Age: 46 y.o. MRN#: [id number] Attending Physician: Elodia Florence., * Primary Care Physician: [name], MD Admit Date: 02/21/2021  Reason for Consultation/Follow-up: Establishing goals of care and Pain control  Subjective: I saw and examined Jimmy Hawkins.  He was lying in bed working on his computer at time of my encounter.  He was in no distress.  His nephew was at the bedside.  Patient denied current pain.  Nephew had questions regarding plans for radiation and we discussed that my understanding is plan is to start radiation tomorrow.  Length of Stay: 4  Current Medications: Scheduled Meds:   Chlorhexidine Gluconate Cloth  6 each Topical Daily   dexamethasone (DECADRON) injection  4 mg Intravenous Q6H   enoxaparin (LOVENOX) injection  40 mg Subcutaneous B16X   folic acid  1 mg Oral Daily   gabapentin  300 mg Oral TID   megestrol  400 mg Oral Daily   multivitamin with minerals  1 tablet Oral Daily   oxyCODONE  20 mg Oral Q8H   pantoprazole  40 mg Oral BID AC   sodium chloride flush  10-40 mL Intracatheter Q12H   sucralfate  1 g Oral QID   [START ON 03/02/2021] thiamine  100 mg Oral Daily   traZODone  50 mg Oral QHS    Continuous Infusions:  thiamine injection Stopped (02/25/21 1002)    PRN Meds: diphenhydrAMINE **AND** acetaminophen, [COMPLETED] acetaminophen **FOLLOWED BY** acetaminophen, HYDROmorphone (DILAUDID) injection, LORazepam, polyethylene glycol, prochlorperazine  Physical Exam         General: Alert, awake, in no acute distress.   HEENT: No bruits, no goiter, no JVD Heart: Regular rate and rhythm. No murmur appreciated. Lungs: Good air movement,  clear Abdomen: Soft, nontender, nondistended, positive bowel sounds.   Ext: No significant edema Skin: Warm and dry  Vital Signs: BP 96/67 (BP Location: Left Arm)    Pulse 66    Temp 98.2 F (36.8 C) (Oral)    Resp 18    Ht 6' (1.829 m)    Wt 69.1 kg    SpO2 100%    BMI 20.67 kg/m  SpO2: SpO2: 100 % O2 Device: O2 Device: Room Air O2 Flow Rate:  Intake/output summary:  Intake/Output Summary (Last 24 hours) at 02/26/2021 0618 Last data filed at 02/26/2021 0500 Gross per 24 hour  Intake 370 ml  Output 3490 ml  Net -3120 ml    LBM: Last BM Date: 02/22/21 Baseline Weight: Weight: 69.1 kg Most recent weight: Weight: 69.1 kg       Palliative Assessment/Data:      Patient Active Problem List   Diagnosis Date Noted   Acute metabolic encephalopathy 01/77/9390   Myoclonic jerking 02/22/2021   Steroid-induced hyperglycemia 02/22/2021   Metastatic cancer ([place]) 02/21/2021   Anemia 02/21/2021   Lower extremity weakness 02/21/2021   Difficulty urinating 02/21/2021   DNR (do not resuscitate) 11/10/2020   Esophageal stricture 09/19/2020   Drug-induced neutropenia ([place]) 08/10/2020   Cancer related pain 03/22/2020   Bone metastases ([place]) 03/22/2020   Iron deficiency anemia 08/12/2019   Upper GI bleed 08/11/2019   Malignant neoplasm of esophagus (HCC)    Hematemesis with nausea 08/10/2019   Syncope, vasovagal 08/10/2019   Acute blood loss anemia 08/10/2019   Alcohol abuse     Palliative Care Assessment & Plan   Patient Profile: 46 y.o. male  with past medical history of alcohol abuse and metastatic esophageal cancer currently on palliative chemotherapy with Dr. Burr Medico admitted on 02/21/2021 with worsening hip pain related to large sacral metastatic lesion with extraosseous extension.  He had radiation to sacrum last February and plan is for further radiation therapy this admission.  Recommendations/Plan: Pain, cancer related: MAR reviewed and he has used 5 doses of IV dilaudid  with an oral morphine equivalent of 100 mg in the last 24 hours.  Continue OxyContin 20 mg 3 times daily.  Continue IV dilaudid for breakthrough pain.  Overall, this is difficult to treat pain secondary to bony metastasis and would recommend continuation of steroids as well as plan for radiation therapy. Anxiety/withdrawal: Continue CIWA. Note Dr. Burr Medico has indicated consideration for home hospice when he completes radiation therapy.  We will continue to follow along to progress this conversation in conjunction with her over the next several days  Code Status:    Code Status Orders  (From admission, onward)           Start     Ordered   02/21/21 1802  Do not attempt resuscitation (DNR)  Continuous       Question Answer Comment  In the event of cardiac or respiratory ARREST Do not call a code blue   In the event of cardiac or respiratory ARREST Do not perform Intubation, CPR, defibrillation or ACLS   In the event of cardiac or respiratory ARREST Use medication by any route, position, wound care, and other measures to relive pain and suffering. May use oxygen, suction and manual treatment of airway obstruction as needed for comfort.      02/21/21 1801           Code Status History     Date Active Date Inactive Code Status Order ID Comments User Context   12/28/2020 1514 02/21/2021 1238 DNR [id number]  Jimmy Footman, NP Outpatient   08/10/2019 1546 08/12/2019 1945 Full Code [id number]  Darliss Cheney, MD ED      Advance Directive Documentation    Flowsheet Row Most Recent Value  Type of Advance Directive Healthcare Power of Attorney, Living will  Pre-existing out of facility DNR order (yellow form or pink MOST form) --  "MOST" Form in Place? --       Prognosis:  <  6 months  Discharge Planning: To Be Determined  Care plan was discussed with patient  Thank you for allowing the Palliative Medicine Team to assist in the care of this patient.  Micheline Rough,  MD  Please contact Palliative Medicine Team phone at [phone] for questions and concerns.

## 2021-02-26 NOTE — Progress Notes (Signed)
° ° °  OVERNIGHT PROGRESS REPORT  Notified by RN for pain returning after 2hrs +/-. Adjusted current regimen to match approximate requirements.    Gershon Cull MSNA MSN ACNPC-AG Acute Care Nurse Practitioner [place]

## 2021-02-26 NOTE — Progress Notes (Signed)
PROGRESS NOTE    Jimmy Hawkins  GYJ:[id number] DOB: April 20, 1975 DOA: 02/21/2021 PCP: Jimmy Merle, MD  No chief complaint on file.   Brief Narrative:  46 yo with hx etoh abuse and metastatic esophageal cancer currently on palliative chemotherapy with Dr. Burr Hawkins.  He called Dr. Burr Hawkins on the day of admission with worsening hip pain and he was directed to the ED.  He recently saw Dr. Burr Hawkins on 1/20 and they had discussion regarding his treatment options and decided on plan to try irinotecan.  He had significant pain and his MS contin dose was adjusted and hydrocodone was transitioned to dilaudid.  MRI showed Jimmy Hawkins large sacral met.  He's been started on steroids and radiation therapy.  Palliative care working on pain regimen.  Per Dr. Burr Hawkins, likely to transition to home hospice after completion of radiation therapy.  Discharge is pending improvement in pain control.    Assessment & Plan:   Principal Problem:   Metastatic cancer (Jimmy Hawkins) Active Problems:   Malignant neoplasm of esophagus (HCC)   Cancer related pain   Bone metastases (HCC)   Lower extremity weakness   Difficulty urinating   Alcohol abuse   Acute metabolic encephalopathy   Myoclonic jerking   Anemia   Steroid-induced hyperglycemia   Assessment and Plan: Malignant neoplasm of esophagus ([place]) Followed by Dr. Burr [facility] Recently had discussion regarding hospice vs continued treatment and decided to continue chemo.  Started on irinotecan on 1/20.   Appreciate her assistance Likely to transition to Jimmy Hawkins after completion of Radiation Therapy per Dr. Burr Hawkins note from 1/27  Bone metastases Jimmy Hawkins)- (present on admission) CT with large sacral metastatic lesion 8.6x6.1 with extraosseous extension He's s/p palliative radiation to sacrum 03/26/20-04/10/20 and short course of dex PET 1/17 with increase in size of metastatic lesion in L hemisacrum MRI sacrum with large met with central necrosis, extensive involvement of S1, S2, and S3  segments with lesser involvement of upper S4.  Lesion is more extensive to L of midline, but involves all sacral foramina.  Tumor fills sacral spinal canal and extends up to L5-S1 disc level.  Extends into scaral hiatus on the L.  Pathologic fx at S2.  Grossing of L SI joint with involvement of iliac. Plan for Radiation Therapy to start 02/26/2021  Cancer related pain Transitioned to oxycontin 20 mg TID per palliative care Continue IV dilaudid prn Dilaudid prn Scheduled APAP Steroids Palliative care for assistance with pain - appreciate assistance Continuous pulse ox Once pain better controlled, can likely discharge and complete radiation outpatient  Difficulty urinating Likely related to metastatic disease Follow MRI as above UA bland, bladder scan  Lower extremity weakness Likely related to mets, discussed with rads, will follow MRI sacrum (as above) Start dexamethasone Radiation oncology c/s as above PT/OT He's got improved strength on exam today, still L>R LE weakness  Myoclonic jerking Related to pain meds Improved with transition to oxycodone  Acute metabolic encephalopathy Suspect related to pain meds This is improved today Ammonia wnl, b12 elevated, folate wnl, VBG without hypercarbia EEG with moderate diffuse encephalopathy, nonspecific etiology - non epileptic jerking episodes - no seizures or epileptiform discharges Jerking has improved High dose thiamine given hx etoh abuse   Alcohol abuse- (present on admission) CIWA High dose thiamine with encephalopathy  Anemia Chronic, follow Normal B12, folate AOCD with iron def  Steroid-induced hyperglycemia a1c 4.8 Will stop SSI and BG checks, follow on daily labs   DVT prophylaxis: lovenox Code Status: DNR Family  Communication: niece, sister Disposition:   Status is: Observation  The patient will require care spanning > 2 midnights and should be moved to inpatient because: need for evaluation of  encephalopathy, rad eval, oncology eval, palliative care eval      Consultants:  Rad onc Oncology Palliative care  Procedures:  none  Antimicrobials:  Anti-infectives (From admission, onward)    None       Subjective: No new complaints Interpreter used  Objective: Vitals:   02/26/21 0442 02/26/21 0800 02/26/21 1400 02/26/21 1701  BP: '96/67 96/67 96/67 ' 98/71  Pulse: 66 66 66 73  Resp: 18   16  Temp: 98.2 F (36.8 C)   98.4 F (36.9 C)  TempSrc: Oral   Oral  SpO2: 100%   98%  Weight:      Height:        Intake/Output Summary (Last 24 hours) at 02/26/2021 2037 Last data filed at 02/26/2021 1846 Gross per 24 hour  Intake 411.13 ml  Output 950 ml  Net -538.87 ml    Filed Weights   02/21/21 1301  Weight: 69.1 kg    Examination:  General: No acute distress. Cardiovascular: RRR Lungs: unlabored Abdomen: Soft, nontender, nondistended  Neurological: L>R LE weakness, antigravity, ~3/5 strength . Skin: Warm and dry. No rashes or lesions. Extremities: No clubbing or cyanosis. No edema.  Data Reviewed: I have personally reviewed following labs and imaging studies  CBC: Recent Labs  Lab 02/21/21 1409 02/21/21 1606 02/21/21 1802 02/22/21 0439 02/23/21 0518 02/24/21 0505 02/25/21 0425  WBC 3.6*  --  2.6* 2.2* 3.5* 2.6* 2.4*  NEUTROABS 2.8  --   --   --  3.2 2.3 2.1  HGB 8.7*   < > 8.9* 9.3* 8.6* 8.8* 9.0*  HCT 26.4*   < > 27.7* 28.0* 26.4* 27.2* 27.3*  MCV 97.4  --  97.9 97.2 97.1 98.2 97.5  PLT 169  --  168 173 174 177 193   < > = values in this interval not displayed.     Basic Metabolic Panel: Recent Labs  Lab 02/21/21 1409 02/21/21 1606 02/21/21 1802 02/22/21 0439 02/23/21 0518 02/24/21 0505 02/25/21 0425  NA 131* 136  --  133* 134* 132* 132*  K 3.3* 3.5  --  3.6 3.5 3.2* 4.0  CL 101 103  --  102 103 101 102  CO2 25  --   --  '23 25 24 24  ' GLUCOSE 105* 101*  --  172* 137* 142* 111*  BUN 8 3*  --  '8 9 10 11  ' CREATININE 0.48* 0.30*  0.35* 0.33* <0.30* 0.47* 0.39*  CALCIUM 8.1*  --   --  8.7* 8.8* 8.8* 8.9  MG  --   --   --   --  2.0 1.9 2.1  PHOS  --   --   --   --  3.5 3.0 3.6     GFR: Estimated Creatinine Clearance: 116.4 mL/min (Jimmy Hawkins) (by C-G formula based on SCr of 0.39 mg/dL (L)).  Liver Function Tests: Recent Labs  Lab 02/21/21 1409 02/22/21 0439 02/23/21 0518 02/24/21 0505 02/25/21 0425  AST 14* 13* '15 18 18  ' ALT '15 16 16 19 22  ' ALKPHOS 58 59 56 54 51  BILITOT 0.5 0.6 0.5 0.5 0.5  PROT 6.2* 6.8 6.6 6.2* 6.1*  ALBUMIN 3.1* 3.1* 3.1* 3.0* 3.0*     CBG: Recent Labs  Lab 02/22/21 1739 02/22/21 2200 02/23/21 0742 02/23/21 1214 02/23/21 1757  GLUCAP 172*  142* 142* 150* 155*      Recent Results (from the past 240 hour(s))  Resp Panel by RT-PCR (Flu Mischell [place]&B, Covid) Nasopharyngeal Swab     Status: None   Collection Time: 02/21/21  6:31 PM   Specimen: Nasopharyngeal Swab; Nasopharyngeal(NP) swabs in vial transport medium  Result Value Ref Range Status   SARS Coronavirus 2 by RT PCR NEGATIVE NEGATIVE Final    Comment: (NOTE) SARS-CoV-2 target nucleic acids are NOT DETECTED.  The SARS-CoV-2 RNA is generally detectable in upper respiratory specimens during the acute phase of infection. The lowest concentration of SARS-CoV-2 viral copies this assay can detect is 138 copies/mL. Dontarious Schaum negative result does not preclude SARS-Cov-2 infection and should not be used as the sole basis for treatment or other patient management decisions. Cabria Micalizzi negative result may occur with  improper specimen collection/handling, submission of specimen other than nasopharyngeal swab, presence of viral mutation(s) within the areas targeted by this assay, and inadequate number of viral copies(<138 copies/mL). Adrion Menz negative result must be combined with clinical observations, patient history, and epidemiological information. The expected result is Negative.  Fact Sheet for Patients:  EntrepreneurPulse.com.au  Fact  Sheet for Healthcare Providers:  IncredibleEmployment.be  This test is no t yet approved or cleared by the [place] FDA and  has been authorized for detection and/or diagnosis of SARS-CoV-2 by FDA under an Emergency Use Authorization (EUA). This EUA will remain  in effect (meaning this test can be used) for the duration of the COVID-19 declaration under Section 564(b)(1) of the Act, 21 U.S.C.section 360bbb-3(b)(1), unless the authorization is terminated  or revoked sooner.       Influenza Tarek Cravens by PCR NEGATIVE NEGATIVE Final   Influenza B by PCR NEGATIVE NEGATIVE Final    Comment: (NOTE) The Xpert Xpress SARS-CoV-2/FLU/RSV plus assay is intended as an aid in the diagnosis of influenza from Nasopharyngeal swab specimens and should not be used as Melven Stockard sole basis for treatment. Nasal washings and aspirates are unacceptable for Xpert Xpress SARS-CoV-2/FLU/RSV testing.  Fact Sheet for Patients: EntrepreneurPulse.com.au  Fact Sheet for Healthcare Providers: IncredibleEmployment.be  This test is not yet approved or cleared by the [place] FDA and has been authorized for detection and/or diagnosis of SARS-CoV-2 by FDA under an Emergency Use Authorization (EUA). This EUA will remain in effect (meaning this test can be used) for the duration of the COVID-19 declaration under Section 564(b)(1) of the Act, 21 U.S.C. section 360bbb-3(b)(1), unless the authorization is terminated or revoked.  Performed at [facility], [place] [address] [place], [place] [address]           Radiology Studies: No results found.      Scheduled Meds:  Chlorhexidine Gluconate Cloth  6 each Topical Daily   dexamethasone (DECADRON) injection  4 mg Intravenous Q6H   enoxaparin (LOVENOX) injection  40 mg Subcutaneous S82L   folic acid  1 mg Oral Daily   gabapentin  300 mg Oral TID   megestrol  400 mg Oral Daily    multivitamin with minerals  1 tablet Oral Daily   oxyCODONE  20 mg Oral Q8H   pantoprazole  40 mg Oral BID AC   sodium chloride flush  10-40 mL Intracatheter Q12H   sucralfate  1 g Oral QID   [START ON 03/02/2021] thiamine  100 mg Oral Daily   traZODone  50 mg Oral QHS   Continuous Infusions:  thiamine injection Stopped (02/26/21 1036)     LOS: 4 days  Time spent: over 30 min    Fayrene Helper, MD [facility]   To contact the attending provider between 7A-7P or the covering provider during after hours 7P-7A, please log into the web site [email or web address] and access using universal [place] password for that web site. If you do not have the password, please call the [facility] operator.  02/26/2021, 8:37 PM

## 2021-02-27 ENCOUNTER — Ambulatory Visit
Admission: RE | Admit: 2021-02-27 | Discharge: 2021-02-27 | Disposition: A | Discharge status: Home / Self Care | Payer: Self-pay | Source: Ambulatory Visit | Attending: Radiation Oncology | Admitting: Radiation Oncology

## 2021-02-27 DIAGNOSIS — Z7189 Other specified counseling: Secondary | ICD-10-CM

## 2021-02-27 MED ORDER — TAMSULOSIN HCL 0.4 MG PO CAPS
0.4000 mg | ORAL_CAPSULE | Freq: Every day | ORAL | Status: DC
  Administered 2021-02-28 – 2021-03-10 (×11): 0.4 mg via ORAL
  Filled 2021-02-27 (×11): qty 1

## 2021-02-27 MED ORDER — SENNOSIDES-DOCUSATE SODIUM 8.6-50 MG PO TABS
1.0000 | ORAL_TABLET | Freq: Two times a day (BID) | ORAL | Status: DC
  Administered 2021-02-27 – 2021-03-10 (×22): 1 via ORAL
  Filled 2021-02-27 (×22): qty 1

## 2021-02-27 MED ORDER — POLYETHYLENE GLYCOL 3350 17 G PO PACK
17.0000 g | PACK | Freq: Every day | ORAL | Status: DC
  Administered 2021-02-27 – 2021-03-01 (×3): 17 g via ORAL
  Filled 2021-02-27 (×3): qty 1

## 2021-02-27 NOTE — Progress Notes (Signed)
PROGRESS NOTE    Jimmy Hawkins  XUX:[id number] DOB: Feb 23, 1975 DOA: 02/21/2021 PCP: [name], MD    No chief complaint on file.   Brief Narrative:  46 yo with hx etoh abuse and metastatic esophageal cancer currently on palliative chemotherapy He had significant pain,.  MRI showed a large sacral met.  He's been started on steroids and radiation therapy.  Palliative care working on pain regimen.  Per Dr. Burr Medico, likely to transition to home hospice after completion of radiation therapy.  Discharge is pending improvement in pain control.   Subjective:  Sleepy, family reports he was not able to sleep until just now, family states he can only lay on his back in one position without significant pain Family do not feel comfortable taking him back home unless pain is better controlled Family agree with residential hospice if pain continue require frequent management Bladder scan 200cc  Assessment & Plan:   Principal Problem:   Metastatic cancer ([place]) Active Problems:   Alcohol abuse   Malignant neoplasm of esophagus ([place])   Cancer related pain   Bone metastases ([place])   Anemia   Lower extremity weakness   Difficulty urinating   Acute metabolic encephalopathy   Myoclonic jerking   Steroid-induced hyperglycemia  Metastatic esophageal cancer with sacral mets -Appreciate medical oncology , radiation oncology and palliative care -palliative radiation to sacrum for pain control, on iv steroids -Appreciate palliative care for pain management -Plan for home with home hospice once completion radiation therapy and pain is better controlled, may need alternative disposition if it difficult for pain control   Difficulty urination Getting bladder scan, start Flomax, prevent constipation May need Foley catheter      Body mass index is 20.67 kg/m.Marland Kitchen  FTT , poor prognosis    Unresulted Labs (From admission, onward)     Start     Ordered   02/21/21 1659  Urinalysis, Routine  w reflex microscopic  Once,   STAT        02/21/21 1658              DVT prophylaxis: enoxaparin (LOVENOX) injection 40 mg Start: 02/25/21 2200   Code Status: DNR Family Communication: Sister at bedside Disposition:   Status is: Inpatient  Dispo: The patient is from: Home              Anticipated d/c is to: Home with hospice once pain is better controlled                Consultants:  Medical oncology Radiation oncology Palliative care  Procedures:  XRT to sacral bone mets  Antimicrobials:    Anti-infectives (From admission, onward)    None          Objective: Vitals:   02/26/21 1400 02/26/21 1701 02/26/21 2201 02/27/21 0606  BP: '96/67 98/71 98/60 ' 93/60  Pulse: 66 73 81 68  Resp:  '16 17 17  ' Temp:  98.4 F (36.9 C) 98.6 F (37 C) 98.4 F (36.9 C)  TempSrc:  Oral Oral Oral  SpO2:  98% 100% 99%  Weight:      Height:        Intake/Output Summary (Last 24 hours) at 02/27/2021 1047 Last data filed at 02/26/2021 2001 Gross per 24 hour  Intake 411.13 ml  Output --  Net 411.13 ml   Filed Weights   02/21/21 1301  Weight: 69.1 kg    Examination:  General exam: He is sleeping, pale Respiratory system:  Respiratory effort normal.  Cardiovascular system:  RRR.  Gastrointestinal system: Abdomen is nondistended, soft and nontender.  Normal bowel sounds. Central nervous system: sleeping  Extremities:  no edema Skin: No rashes, lesions or ulcers Psychiatry: sleeping.     Data Reviewed: I have personally reviewed following labs and imaging studies  CBC: Recent Labs  Lab 02/21/21 1409 02/21/21 1606 02/21/21 1802 02/22/21 0439 02/23/21 0518 02/24/21 0505 02/25/21 0425  WBC 3.6*  --  2.6* 2.2* 3.5* 2.6* 2.4*  NEUTROABS 2.8  --   --   --  3.2 2.3 2.1  HGB 8.7*   < > 8.9* 9.3* 8.6* 8.8* 9.0*  HCT 26.4*   < > 27.7* 28.0* 26.4* 27.2* 27.3*  MCV 97.4  --  97.9 97.2 97.1 98.2 97.5  PLT 169  --  168 173 174 177 193   < > = values in this interval  not displayed.    Basic Metabolic Panel: Recent Labs  Lab 02/21/21 1409 02/21/21 1606 02/21/21 1802 02/22/21 0439 02/23/21 0518 02/24/21 0505 02/25/21 0425  NA 131* 136  --  133* 134* 132* 132*  K 3.3* 3.5  --  3.6 3.5 3.2* 4.0  CL 101 103  --  102 103 101 102  CO2 25  --   --  '23 25 24 24  ' GLUCOSE 105* 101*  --  172* 137* 142* 111*  BUN 8 3*  --  '8 9 10 11  ' CREATININE 0.48* 0.30* 0.35* 0.33* <0.30* 0.47* 0.39*  CALCIUM 8.1*  --   --  8.7* 8.8* 8.8* 8.9  MG  --   --   --   --  2.0 1.9 2.1  PHOS  --   --   --   --  3.5 3.0 3.6    GFR: Estimated Creatinine Clearance: 116.4 mL/min (A) (by C-G formula based on SCr of 0.39 mg/dL (L)).  Liver Function Tests: Recent Labs  Lab 02/21/21 1409 02/22/21 0439 02/23/21 0518 02/24/21 0505 02/25/21 0425  AST 14* 13* '15 18 18  ' ALT '15 16 16 19 22  ' ALKPHOS 58 59 56 54 51  BILITOT 0.5 0.6 0.5 0.5 0.5  PROT 6.2* 6.8 6.6 6.2* 6.1*  ALBUMIN 3.1* 3.1* 3.1* 3.0* 3.0*    CBG: Recent Labs  Lab 02/22/21 1739 02/22/21 2200 02/23/21 0742 02/23/21 1214 02/23/21 1757  GLUCAP 172* 142* 142* 150* 155*     Recent Results (from the past 240 hour(s))  Resp Panel by RT-PCR (Flu A&B, Covid) Nasopharyngeal Swab     Status: None   Collection Time: 02/21/21  6:31 PM   Specimen: Nasopharyngeal Swab; Nasopharyngeal(NP) swabs in vial transport medium  Result Value Ref Range Status   SARS Coronavirus 2 by RT PCR NEGATIVE NEGATIVE Final    Comment: (NOTE) SARS-CoV-2 target nucleic acids are NOT DETECTED.  The SARS-CoV-2 RNA is generally detectable in upper respiratory specimens during the acute phase of infection. The lowest concentration of SARS-CoV-2 viral copies this assay can detect is 138 copies/mL. A negative result does not preclude SARS-Cov-2 infection and should not be used as the sole basis for treatment or other patient management decisions. A negative result may occur with  improper specimen collection/handling, submission of  specimen other than nasopharyngeal swab, presence of viral mutation(s) within the areas targeted by this assay, and inadequate number of viral copies(<138 copies/mL). A negative result must be combined with clinical observations, patient history, and epidemiological information. The expected result is Negative.  Fact Sheet for Patients:  EntrepreneurPulse.com.au  Fact  Sheet for Healthcare Providers:  IncredibleEmployment.be  This test is no t yet approved or cleared by the [place] FDA and  has been authorized for detection and/or diagnosis of SARS-CoV-2 by FDA under an Emergency Use Authorization (EUA). This EUA will remain  in effect (meaning this test can be used) for the duration of the COVID-19 declaration under Section 564(b)(1) of the Act, 21 U.S.C.section 360bbb-3(b)(1), unless the authorization is terminated  or revoked sooner.       Influenza A by PCR NEGATIVE NEGATIVE Final   Influenza B by PCR NEGATIVE NEGATIVE Final    Comment: (NOTE) The Xpert Xpress SARS-CoV-2/FLU/RSV plus assay is intended as an aid in the diagnosis of influenza from Nasopharyngeal swab specimens and should not be used as a sole basis for treatment. Nasal washings and aspirates are unacceptable for Xpert Xpress SARS-CoV-2/FLU/RSV testing.  Fact Sheet for Patients: EntrepreneurPulse.com.au  Fact Sheet for Healthcare Providers: IncredibleEmployment.be  This test is not yet approved or cleared by the [place] FDA and has been authorized for detection and/or diagnosis of SARS-CoV-2 by FDA under an Emergency Use Authorization (EUA). This EUA will remain in effect (meaning this test can be used) for the duration of the COVID-19 declaration under Section 564(b)(1) of the Act, 21 U.S.C. section 360bbb-3(b)(1), unless the authorization is terminated or revoked.  Performed at [facility], [place]  [address], [place], [place] [address]          Radiology Studies: No results found.      Scheduled Meds:  Chlorhexidine Gluconate Cloth  6 each Topical Daily   dexamethasone (DECADRON) injection  4 mg Intravenous Q6H   enoxaparin (LOVENOX) injection  40 mg Subcutaneous F42D   folic acid  1 mg Oral Daily   gabapentin  300 mg Oral TID   megestrol  400 mg Oral Daily   multivitamin with minerals  1 tablet Oral Daily   oxyCODONE  20 mg Oral Q8H   pantoprazole  40 mg Oral BID AC   sodium chloride flush  10-40 mL Intracatheter Q12H   sucralfate  1 g Oral QID   [START ON 03/02/2021] thiamine  100 mg Oral Daily   traZODone  50 mg Oral QHS   Continuous Infusions:  thiamine injection 250 mg (02/27/21 0902)     LOS: 5 days    Voice Recognition /Dragon dictation system was used to create this note, attempts have been made to correct errors. Please contact the author with questions and/or clarifications.   Florencia Reasons, MD PhD FACP [facility]  Available via Epic secure chat 7am-7pm for nonurgent issues Please page for urgent issues To page the attending provider between 7A-7P or the covering provider during after hours 7P-7A, please log into the web site [email or web address] and access using universal [place] password for that web site. If you do not have the password, please call the hospital operator.    02/27/2021, 10:47 AM

## 2021-02-27 NOTE — Progress Notes (Signed)
Daily Progress Note   Patient Name: Jimmy Hawkins       Date: 02/27/2021 DOB: [birth year]  Age: 46 y.o. MRN#: [id number] Attending Physician: Florencia Reasons, MD Primary Care Physician: [name], MD Admit Date: 02/21/2021  Reason for Consultation/Follow-up: Establishing goals of care and Pain control  Subjective: Patient examined at the bedside. He is resting. Appears somnolent during examination and discussions with family. Will occasionally awaken but falls back to sleep. Appetite remains minimal.   His niece and nephew are at the bedside. They share patient recently drifted off to sleep and appears comfortable compared to previously. Discussions had regarding current pain regimen and availability at discharge.   I expressed concerns with patient's ability to return home and come back daily for radiation if the goal is to complete therapy due to his ongoing pain and significant weakness. His last radiation treatment is scheduled for 03/15/2021. Plans as discussed with Dr. Burr Medico is to transition home with hospice for what time he has left. Family verbalizes understanding. His niece expresses wishes to discuss in further detail with her mom, Verdis Frederickson (Patient's sister). I will reach out to [facility] tomorrow with request to have a family meeting and confirm goals of care and assess family's awareness of disease trajectory. I think this will be important to appropriately plan next steps. I would not want to transition him from Oxycontin to methadone if decisions for residential hospice vs home is concluded. Family verbalized understanding. We will continue with current plan of care at this time with anticipation of family meeting in the next several days.   All questions answered and support provided.    Length of Stay: 5  Current Medications: Scheduled Meds:   Chlorhexidine Gluconate Cloth  6 each Topical Daily   dexamethasone (DECADRON) injection  4 mg Intravenous Q6H   enoxaparin (LOVENOX) injection  40 mg Subcutaneous D92E   folic acid  1 mg Oral Daily   gabapentin  300 mg Oral TID   megestrol  400 mg Oral Daily   multivitamin with minerals  1 tablet Oral Daily   oxyCODONE  20 mg Oral Q8H   pantoprazole  40 mg Oral BID AC   polyethylene glycol  17 g Oral Daily   senna-docusate  1 tablet Oral BID   sodium chloride flush  10-40 mL Intracatheter  Q12H   sucralfate  1 g Oral QID   [START ON 02/28/2021] tamsulosin  0.4 mg Oral QPC breakfast   [START ON 03/02/2021] thiamine  100 mg Oral Daily   traZODone  50 mg Oral QHS    Continuous Infusions:  thiamine injection 250 mg (02/27/21 0902)    PRN Meds: diphenhydrAMINE **AND** acetaminophen, [COMPLETED] acetaminophen **FOLLOWED BY** acetaminophen, HYDROmorphone (DILAUDID) injection, HYDROmorphone, LORazepam, polyethylene glycol, prochlorperazine  Physical Exam         General: somnolent, ill-appearing   Heart: Regular rate and rhythm. No murmur appreciated. Lungsdiminished  Abdomen: Soft, nontender, nondistended, positive bowel sounds.   Ext: No significant edema Skin: Warm and dry Neuro: AAO x4 with interpretation when awaken  Vital Signs: BP 102/70 (BP Location: Left Arm)    Pulse 74    Temp 98.7 F (37.1 C) (Oral)    Resp 18    Ht 6' (1.829 m)    Wt 69.1 kg    SpO2 100%    BMI 20.67 kg/m  SpO2: SpO2: 100 % O2 Device: O2 Device: Room Air O2 Flow Rate:    Intake/output summary:  Intake/Output Summary (Last 24 hours) at 02/27/2021 2150 Last data filed at 02/27/2021 2751 Gross per 24 hour  Intake 120 ml  Output 400 ml  Net -280 ml    LBM: Last BM Date: 02/22/21 Baseline Weight: Weight: 69.1 kg Most recent weight: Weight: 69.1 kg       Palliative Assessment/Data:PPS 20-30%      Patient Active Problem List    Diagnosis Date Noted   Acute metabolic encephalopathy 70/01/7492   Myoclonic jerking 02/22/2021   Steroid-induced hyperglycemia 02/22/2021   Metastatic cancer ([place]) 02/21/2021   Anemia 02/21/2021   Lower extremity weakness 02/21/2021   Difficulty urinating 02/21/2021   DNR (do not resuscitate) 11/10/2020   Esophageal stricture 09/19/2020   Drug-induced neutropenia ([place]) 08/10/2020   Cancer related pain 03/22/2020   Bone metastases ([place]) 03/22/2020   Iron deficiency anemia 08/12/2019   Upper GI bleed 08/11/2019   Malignant neoplasm of esophagus ([facility])    Hematemesis with nausea 08/10/2019   Syncope, vasovagal 08/10/2019   Acute blood loss anemia 08/10/2019   Alcohol abuse     Palliative Care Assessment & Plan   Patient Profile: 46 y.o. male  with past medical history of alcohol abuse and metastatic esophageal cancer currently on palliative chemotherapy with Dr. Burr Medico admitted on 02/21/2021 with worsening hip pain related to large sacral metastatic lesion with extraosseous extension.  He had radiation to sacrum last February and plan is for further radiation therapy this admission.  Recommendations/Plan: Pain, cancer related: MAR reviewed and he has used 8 doses of IV dilaudid. Continue OxyContin 20 mg 3 times daily. Of note patient does not have health insurance. Once he is discharged home he most likely will not be able to afford OxyContin out of pocket. Given myoclonic jerking on admission when taking MS Contin, may need to consider transitioning to methadone for more financial feasibility. Will closely monitor. If patient discharges with hospice support may continue current regimen.   Overall, this is difficult to treat pain secondary to bony metastasis and would recommend continuation of steroids as well as plan for radiation therapy. Continue gabapentin  Prior to admission patient was taking oral dilaudid every 3hrs as needed. Will re-order dilaudid 2-4 mg every 3 hours PRN in  anticipation of discharging home on effective regimen. He has received 1 dose of oral medication. Will evaluate IV usage over the  next 24 hrs and make adjustments as necessary.  Anxiety/withdrawal: Continue CIWA. I have requested for family meeting to further clarify goals of care and assess knowledge of patient's poor prognosis. Given patient's ongoing pain, poor prognosis, and continued decline he would be appropriate for residential hospice vs outpatient hospice support in the home. Family is concerned about their ability to manage his pain. Further direction and decisions once we are able to have family meeting (tentatively on Friday).  Note Dr. Burr Medico has indicated consideration for home hospice when he completes radiation therapy.  We will continue to follow along to progress this conversation in conjunction with her over the next several days.   Code Status:    Code Status Orders  (From admission, onward)           Start     Ordered   02/21/21 1802  Do not attempt resuscitation (DNR)  Continuous       Question Answer Comment  In the event of cardiac or respiratory ARREST Do not call a code blue   In the event of cardiac or respiratory ARREST Do not perform Intubation, CPR, defibrillation or ACLS   In the event of cardiac or respiratory ARREST Use medication by any route, position, wound care, and other measures to relive pain and suffering. May use oxygen, suction and manual treatment of airway obstruction as needed for comfort.      02/21/21 1801           Code Status History     Date Active Date Inactive Code Status Order ID Comments User Context   12/28/2020 1514 02/21/2021 1238 DNR [id number]  Jimmy Footman, NP Outpatient   08/10/2019 1546 08/12/2019 1945 Full Code [id number]  Darliss Cheney, MD ED      Advance Directive Documentation    Flowsheet Row Most Recent Value  Type of Advance Directive Healthcare Power of Attorney, Living will  Pre-existing out of  facility DNR order (yellow form or pink MOST form) --  "MOST" Form in Place? --       Prognosis:  POOR (weeks)  Discharge Planning: To Be Determined  Care plan was discussed with patient and his family.   Thank you for allowing the Palliative Medicine Team to assist in the care of this patient.  Time Total: 35 min.   Visit consisted of counseling and education dealing with the complex and emotionally intense issues of symptom management and palliative care in the setting of serious and potentially life-threatening illness.Greater than 50%  of this time was spent counseling and coordinating care related to the above assessment and plan.  Alda Lea, AGPCNP-BC  Palliative Medicine Team [phone]

## 2021-02-27 NOTE — TOC Initial Note (Signed)
Transition of Care [facility]) - Initial/Assessment Note    Patient Details  Name: Jimmy Hawkins MRN: [id number] Date of Birth: 01/19/1976  Transition of Care [facility]) CM/SW Contact:    Lynnell Catalan, RN Phone Number: 02/27/2021, 3:27 PM  Clinical Narrative:                 TOC is follow for dc planning. Per PMT note from 1/31 family are Jimmy to assist pt with transportation to and from radiation appointments. Tentative plan is for pt to have home hospice once radiation is completed. This could be set up in the outpatient setting if pt goes home with continued radiation appointments. TOC will follow and assist as needed.            Activities of Daily Living Home Assistive Devices/Equipment: Cane (specify quad or straight) ADL Screening (condition at time of admission) Patient's cognitive ability adequate to safely complete daily activities?: Yes Is the patient deaf or have difficulty hearing?: No Does the patient have difficulty seeing, even when wearing glasses/contacts?: No Does the patient have difficulty concentrating, remembering, or making decisions?: No Patient Jimmy to express need for assistance with ADLs?: Yes Does the patient have difficulty dressing or bathing?: Yes Independently performs ADLs?: No Does the patient have difficulty walking or climbing stairs?: Yes Weakness of Legs: Both Weakness of Arms/Hands: Right       Admission diagnosis:  Metastatic cancer [facility]) [C79.9] Patient Active Problem List   Diagnosis Date Noted   Acute metabolic encephalopathy 26/37/8588   Myoclonic jerking 02/22/2021   Steroid-induced hyperglycemia 02/22/2021   Metastatic cancer ([place]) 02/21/2021   Anemia 02/21/2021   Lower extremity weakness 02/21/2021   Difficulty urinating 02/21/2021   DNR (do not resuscitate) 11/10/2020   Esophageal stricture 09/19/2020   Drug-induced neutropenia ([place]) 08/10/2020   Cancer related pain 03/22/2020   Bone metastases ([place]) 03/22/2020    Iron deficiency anemia 08/12/2019   Upper GI bleed 08/11/2019   Malignant neoplasm of esophagus ([place])    Hematemesis with nausea 08/10/2019   Syncope, vasovagal 08/10/2019   Acute blood loss anemia 08/10/2019   Alcohol abuse    PCP:  [name], MD Pharmacy:   [place] [address] Phone: [phone] Fax: [place], [place]. [place] [address] Phone: [phone] Fax: [phone]     Social Determinants of Health (SDOH) Interventions    Readmission Risk Interventions No flowsheet data found.

## 2021-02-27 NOTE — Progress Notes (Signed)
PT Cancellation Note  Patient Details Name: Jimmy Hawkins MRN: [id number] DOB: November 12, 1975   Cancelled Treatment:    Reason Eval/Treat Not Completed: Other (comment) (pain, asleep). Family at bedside, reports pt with high bil hip pain and was just able to fall asleep. Requesting therapy come back later. Family also states pt has radiation at 66; will check back as schedule permits for PT.    Tori Sue Mcalexander PT, DPT 02/27/21, 11:14 AM

## 2021-02-28 ENCOUNTER — Ambulatory Visit
Admit: 2021-02-28 | Discharge: 2021-02-28 | Disposition: A | Discharge status: Home / Self Care | Payer: Self-pay | Attending: Radiation Oncology | Admitting: Radiation Oncology

## 2021-02-28 MED FILL — Dexamethasone Sodium Phosphate Inj 100 MG/10ML: INTRAMUSCULAR | Qty: 1 | Status: AC

## 2021-02-28 NOTE — Progress Notes (Signed)
Jimmy Hawkins   DOB:12/13/1975   GU#:[id number]   SEG#:[id number]  Oncology follow up   Subjective: Patient has been tolerating radiation well.  His pain is overall controlled.  His legs are still very weak, he has not been able to walk.  His appetite is decent, eats well.  No other new complaints.   Objective:  Vitals:   02/28/21 0452 02/28/21 1402  BP: 95/63 105/72  Pulse: 70 69  Resp: 18 15  Temp: 98.4 F (36.9 C) 98.2 F (36.8 C)  SpO2: 100% 100%    Body mass index is 20.67 kg/m.  Intake/Output Summary (Last 24 hours) at 02/28/2021 1654 Last data filed at 02/28/2021 1044 Gross per 24 hour  Intake 120 ml  Output 1300 ml  Net -1180 ml     Sclerae unicteric  No leg edema, he is able to move all extremities   CBG (last 3)  No results for input(s): GLUCAP in the last 72 hours.    Labs:  Urine Studies No results for input(s): UHGB, CRYS in the last 72 hours.  Invalid input(s): UACOL, UAPR, USPG, UPH, UTP, UGL, UKET, UBIL, UNIT, UROB, ULEU, UEPI, UWBC, URBC, UBAC, CAST, [place], [place]  Basic Metabolic Panel: Recent Labs  Lab 02/21/21 1802 02/22/21 0439 02/22/21 0439 02/23/21 0518 02/24/21 0505 02/25/21 0425  NA  --  133*  --  134* 132* 132*  K  --  3.6   < > 3.5 3.2* 4.0  CL  --  102  --  103 101 102  CO2  --  23  --  25 24 24   GLUCOSE  --  172*  --  137* 142* 111*  BUN  --  8  --  9 10 11   CREATININE 0.35* 0.33*  --  <0.30* 0.47* 0.39*  CALCIUM  --  8.7*  --  8.8* 8.8* 8.9  MG  --   --   --  2.0 1.9 2.1  PHOS  --   --   --  3.5 3.0 3.6   < > = values in this interval not displayed.   GFR Estimated Creatinine Clearance: 116.4 mL/min (A) (by C-G formula based on SCr of 0.39 mg/dL (L)). Liver Function Tests: Recent Labs  Lab 02/22/21 0439 02/23/21 0518 02/24/21 0505 02/25/21 0425  AST 13* 15 18 18   ALT 16 16 19 22   ALKPHOS 59 56 54 51  BILITOT 0.6 0.5 0.5 0.5  PROT 6.8 6.6 6.2* 6.1*  ALBUMIN 3.1* 3.1* 3.0* 3.0*   No results for input(s):  LIPASE, AMYLASE in the last 168 hours. Recent Labs  Lab 02/22/21 1240  AMMONIA 23   Coagulation profile No results for input(s): INR, PROTIME in the last 168 hours.  CBC: Recent Labs  Lab 02/21/21 1802 02/22/21 0439 02/23/21 0518 02/24/21 0505 02/25/21 0425  WBC 2.6* 2.2* 3.5* 2.6* 2.4*  NEUTROABS  --   --  3.2 2.3 2.1  HGB 8.9* 9.3* 8.6* 8.8* 9.0*  HCT 27.7* 28.0* 26.4* 27.2* 27.3*  MCV 97.9 97.2 97.1 98.2 97.5  PLT 168 173 174 177 193   Cardiac Enzymes: No results for input(s): CKTOTAL, CKMB, CKMBINDEX, TROPONINI in the last 168 hours. BNP: Invalid input(s): POCBNP CBG: Recent Labs  Lab 02/22/21 1739 02/22/21 2200 02/23/21 0742 02/23/21 1214 02/23/21 1757  GLUCAP 172* 142* 142* 150* 155*   D-Dimer No results for input(s): DDIMER in the last 72 hours. Hgb A1c No results for input(s): HGBA1C in the last 72 hours.  Lipid  Profile No results for input(s): CHOL, HDL, LDLCALC, TRIG, CHOLHDL, LDLDIRECT in the last 72 hours. Thyroid function studies No results for input(s): TSH, T4TOTAL, T3FREE, THYROIDAB in the last 72 hours.  Invalid input(s): FREET3  Anemia work up No results for input(s): VITAMINB12, FOLATE, FERRITIN, TIBC, IRON, RETICCTPCT in the last 72 hours.  Microbiology Recent Results (from the past 240 hour(s))  Resp Panel by RT-PCR (Flu A&B, Covid) Nasopharyngeal Swab     Status: None   Collection Time: 02/21/21  6:31 PM   Specimen: Nasopharyngeal Swab; Nasopharyngeal(NP) swabs in vial transport medium  Result Value Ref Range Status   SARS Coronavirus 2 by RT PCR NEGATIVE NEGATIVE Final    Comment: (NOTE) SARS-CoV-2 target nucleic acids are NOT DETECTED.  The SARS-CoV-2 RNA is generally detectable in upper respiratory specimens during the acute phase of infection. The lowest concentration of SARS-CoV-2 viral copies this assay can detect is 138 copies/mL. A negative result does not preclude SARS-Cov-2 infection and should not be used as the sole  basis for treatment or other patient management decisions. A negative result may occur with  improper specimen collection/handling, submission of specimen other than nasopharyngeal swab, presence of viral mutation(s) within the areas targeted by this assay, and inadequate number of viral copies(<138 copies/mL). A negative result must be combined with clinical observations, patient history, and epidemiological information. The expected result is Negative.  Fact Sheet for Patients:  EntrepreneurPulse.com.au  Fact Sheet for Healthcare Providers:  IncredibleEmployment.be  This test is no t yet approved or cleared by the [place] FDA and  has been authorized for detection and/or diagnosis of SARS-CoV-2 by FDA under an Emergency Use Authorization (EUA). This EUA will remain  in effect (meaning this test can be used) for the duration of the COVID-19 declaration under Section 564(b)(1) of the Act, 21 U.S.C.section 360bbb-3(b)(1), unless the authorization is terminated  or revoked sooner.       Influenza A by PCR NEGATIVE NEGATIVE Final   Influenza B by PCR NEGATIVE NEGATIVE Final    Comment: (NOTE) The Xpert Xpress SARS-CoV-2/FLU/RSV plus assay is intended as an aid in the diagnosis of influenza from Nasopharyngeal swab specimens and should not be used as a sole basis for treatment. Nasal washings and aspirates are unacceptable for Xpert Xpress SARS-CoV-2/FLU/RSV testing.  Fact Sheet for Patients: EntrepreneurPulse.com.au  Fact Sheet for Healthcare Providers: IncredibleEmployment.be  This test is not yet approved or cleared by the [place] FDA and has been authorized for detection and/or diagnosis of SARS-CoV-2 by FDA under an Emergency Use Authorization (EUA). This EUA will remain in effect (meaning this test can be used) for the duration of the COVID-19 declaration under Section 564(b)(1) of the Act,  21 U.S.C. section 360bbb-3(b)(1), unless the authorization is terminated or revoked.  Performed at [facility], [place] [address] [place], [place] [address]       Studies:  No results found.  Assessment: 46 y.o. 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  History of heavy alcohol user 5.  Altered mental status and jerking, possibly related to narcotics, improved  6. DNR    Plan:  -Patient is tolerating radiation well, his last dose of radiation is scheduled for 2/17. Due to his leg weakness, it will be difficult to  do RT as outpt. I will talk to rad/onc Dr. Lisbeth Renshaw to see if he can shorten his radiation course.  -I again reviewed his overall poor prognosis with patient and his sister and niece, and my recommendation of stopping chemotherapy.  They are in agreement. -I recommend home hospice, I discussed the logistics of hospice with patient and his sister.  They are open to it. I think he may not be a candidate for residential hospice yet. -I appreciate the excellent care from hospitalist team and palliative care, family meeting scheduled for tomorrow to discuss discharge planning. I will communicate with palliative care team before the meeting.  -I will f/u next week. Call me if needed.   [name], MD 02/28/2021  4:54 PM

## 2021-02-28 NOTE — Progress Notes (Signed)
PROGRESS NOTE    Jimmy Hawkins  VOZ:[id number] DOB: Dec 25, 1975 DOA: 02/21/2021 PCP: [name], MD    No chief complaint on file.   Brief Narrative:  46 yo with hx etoh abuse and metastatic esophageal cancer currently on palliative chemotherapy He had significant pain,.  MRI showed a large sacral met.  He's been started on steroids and radiation therapy.  Palliative care working on pain regimen.  Per Dr. Burr Medico, likely to transition to home hospice after completion of radiation therapy.  Discharge is pending improvement in pain control.   Subjective:  He is fully awake and smiling, looking a lot comfortable today  Reports pain is 5 out of 10 currently ,  Family at bedside  Assessment & Plan:   Principal Problem:   Metastatic cancer ([place]) Active Problems:   Alcohol abuse   Malignant neoplasm of esophagus ([place])   Cancer related pain   Bone metastases ([place])   Anemia   Lower extremity weakness   Difficulty urinating   Acute metabolic encephalopathy   Myoclonic jerking   Steroid-induced hyperglycemia  Metastatic esophageal cancer with sacral mets -Appreciate medical oncology , radiation oncology and palliative care -palliative radiation to sacrum for pain control, on iv steroids -Appreciate palliative care for pain management -Plan for home with home hospice once completion radiation therapy and pain is better controlled, may need alternative disposition if it difficult for pain control   Difficulty urination Getting bladder scan, start Flomax, prevent constipation May need Foley catheter Denies bladder fullness, able to get up from bed use the bathroom just helpful family      Body mass index is 20.67 kg/m.Marland Kitchen  FTT , poor prognosis    Unresulted Labs (From admission, onward)    None         DVT prophylaxis: enoxaparin (LOVENOX) injection 40 mg Start: 02/25/21 2200   Code Status: DNR Family Communication: Niece at bedside Disposition:   Status  is: Inpatient  Dispo: The patient is from: Home              Anticipated d/c is to: Home with hospice once pain is better controlled                Consultants:  Medical oncology Radiation oncology Palliative care  Procedures:  XRT to sacral bone mets  Antimicrobials:    Anti-infectives (From admission, onward)    None          Objective: Vitals:   02/27/21 1348 02/27/21 2051 02/28/21 0452 02/28/21 1402  BP: 95/66 102/70 95/63 105/72  Pulse: 60 74 70 69  Resp: '16 18 18 15  ' Temp: 98.3 F (36.8 C) 98.7 F (37.1 C) 98.4 F (36.9 C) 98.2 F (36.8 C)  TempSrc: Oral Oral Oral Oral  SpO2: 100% 100% 100% 100%  Weight:      Height:        Intake/Output Summary (Last 24 hours) at 02/28/2021 1929 Last data filed at 02/28/2021 1918 Gross per 24 hour  Intake 360 ml  Output 900 ml  Net -540 ml   Filed Weights   02/21/21 1301  Weight: 69.1 kg    Examination:  General exam: AAOx3, pleasant, NAD Respiratory system:  Respiratory effort normal. Cardiovascular system:  RRR.  Gastrointestinal system: Abdomen is nondistended, soft and nontender.  Normal bowel sounds. Central nervous system: AAO x3 Extremities:  no edema Skin: No rashes, lesions or ulcers Psychiatry: Pleasant and cooperative    Data Reviewed: I have personally reviewed following labs  and imaging studies  CBC: Recent Labs  Lab 02/22/21 0439 02/23/21 0518 02/24/21 0505 02/25/21 0425  WBC 2.2* 3.5* 2.6* 2.4*  NEUTROABS  --  3.2 2.3 2.1  HGB 9.3* 8.6* 8.8* 9.0*  HCT 28.0* 26.4* 27.2* 27.3*  MCV 97.2 97.1 98.2 97.5  PLT 173 174 177 153    Basic Metabolic Panel: Recent Labs  Lab 02/22/21 0439 02/23/21 0518 02/24/21 0505 02/25/21 0425  NA 133* 134* 132* 132*  K 3.6 3.5 3.2* 4.0  CL 102 103 101 102  CO2 '23 25 24 24  ' GLUCOSE 172* 137* 142* 111*  BUN '8 9 10 11  ' CREATININE 0.33* <0.30* 0.47* 0.39*  CALCIUM 8.7* 8.8* 8.8* 8.9  MG  --  2.0 1.9 2.1  PHOS  --  3.5 3.0 3.6     GFR: Estimated Creatinine Clearance: 116.4 mL/min (A) (by C-G formula based on SCr of 0.39 mg/dL (L)).  Liver Function Tests: Recent Labs  Lab 02/22/21 0439 02/23/21 0518 02/24/21 0505 02/25/21 0425  AST 13* '15 18 18  ' ALT '16 16 19 22  ' ALKPHOS 59 56 54 51  BILITOT 0.6 0.5 0.5 0.5  PROT 6.8 6.6 6.2* 6.1*  ALBUMIN 3.1* 3.1* 3.0* 3.0*    CBG: Recent Labs  Lab 02/22/21 1739 02/22/21 2200 02/23/21 0742 02/23/21 1214 02/23/21 1757  GLUCAP 172* 142* 142* 150* 155*     Recent Results (from the past 240 hour(s))  Resp Panel by RT-PCR (Flu A&B, Covid) Nasopharyngeal Swab     Status: None   Collection Time: 02/21/21  6:31 PM   Specimen: Nasopharyngeal Swab; Nasopharyngeal(NP) swabs in vial transport medium  Result Value Ref Range Status   SARS Coronavirus 2 by RT PCR NEGATIVE NEGATIVE Final    Comment: (NOTE) SARS-CoV-2 target nucleic acids are NOT DETECTED.  The SARS-CoV-2 RNA is generally detectable in upper respiratory specimens during the acute phase of infection. The lowest concentration of SARS-CoV-2 viral copies this assay can detect is 138 copies/mL. A negative result does not preclude SARS-Cov-2 infection and should not be used as the sole basis for treatment or other patient management decisions. A negative result may occur with  improper specimen collection/handling, submission of specimen other than nasopharyngeal swab, presence of viral mutation(s) within the areas targeted by this assay, and inadequate number of viral copies(<138 copies/mL). A negative result must be combined with clinical observations, patient history, and epidemiological information. The expected result is Negative.  Fact Sheet for Patients:  EntrepreneurPulse.com.au  Fact Sheet for Healthcare Providers:  IncredibleEmployment.be  This test is no t yet approved or cleared by the [place] FDA and  has been authorized for detection and/or  diagnosis of SARS-CoV-2 by FDA under an Emergency Use Authorization (EUA). This EUA will remain  in effect (meaning this test can be used) for the duration of the COVID-19 declaration under Section 564(b)(1) of the Act, 21 U.S.C.section 360bbb-3(b)(1), unless the authorization is terminated  or revoked sooner.       Influenza A by PCR NEGATIVE NEGATIVE Final   Influenza B by PCR NEGATIVE NEGATIVE Final    Comment: (NOTE) The Xpert Xpress SARS-CoV-2/FLU/RSV plus assay is intended as an aid in the diagnosis of influenza from Nasopharyngeal swab specimens and should not be used as a sole basis for treatment. Nasal washings and aspirates are unacceptable for Xpert Xpress SARS-CoV-2/FLU/RSV testing.  Fact Sheet for Patients: EntrepreneurPulse.com.au  Fact Sheet for Healthcare Providers: IncredibleEmployment.be  This test is not yet approved or cleared by the  [place] Architectural technologist and has been authorized for detection and/or diagnosis of SARS-CoV-2 by FDA under an Print production planner (EUA). This EUA will remain in effect (meaning this test can be used) for the duration of the COVID-19 declaration under Section 564(b)(1) of the Act, 21 U.S.C. section 360bbb-3(b)(1), unless the authorization is terminated or revoked.  Performed at [facility], [place] [address], [place], [place] [address]          Radiology Studies: No results found.      Scheduled Meds:  Chlorhexidine Gluconate Cloth  6 each Topical Daily   dexamethasone (DECADRON) injection  4 mg Intravenous Q6H   enoxaparin (LOVENOX) injection  40 mg Subcutaneous [id number]   folic acid  1 mg Oral Daily   gabapentin  300 mg Oral TID   megestrol  400 mg Oral Daily   multivitamin with minerals  1 tablet Oral Daily   oxyCODONE  20 mg Oral Q8H   pantoprazole  40 mg Oral BID AC   polyethylene glycol  17 g Oral Daily   senna-docusate  1 tablet Oral BID   sodium  chloride flush  10-40 mL Intracatheter Q12H   sucralfate  1 g Oral QID   tamsulosin  0.4 mg Oral QPC breakfast   [START ON 03/02/2021] thiamine  100 mg Oral Daily   traZODone  50 mg Oral QHS   Continuous Infusions:  thiamine injection 250 mg (02/28/21 0939)     LOS: 6 days    Voice Recognition /Dragon dictation system was used to create this note, attempts have been made to correct errors. Please contact the author with questions and/or clarifications.   Florencia Reasons, MD PhD FACP [facility]  Available via Epic secure chat 7am-7pm for nonurgent issues Please page for urgent issues To page the attending provider between 7A-7P or the covering provider during after hours 7P-7A, please log into the web site [email or web address] and access using universal [place] password for that web site. If you do not have the password, please call the hospital operator.    02/28/2021, 7:29 PM

## 2021-02-28 NOTE — Progress Notes (Addendum)
Daily Progress Note   Patient Name: Jimmy Hawkins       Date: 02/28/2021 DOB: 1975/03/23  Age: 46 y.o. MRN#: [id number] Attending Physician: Florencia Reasons, MD Primary Care Physician: [name], MD Admit Date: 02/21/2021  Reason for Consultation/Follow-up: Establishing goals of care and Pain control  Subjective: Patient resting in bed. Appears comfortable. Was watching a movie on his laptop in [place]. Pain is better controlled per patient and family. He is somewhat sleepy but easily aroused. Is tolerating radiation treatment. Appetite is fair.   His niece is at the bedside. We discussed having a family meeting to further discuss goals of care and future plans to consider hospice. Family is concerned about the level of pain management he will need once he is discharged and do not want him to be suffering. He seems to be comfortable which they are appreciative of.   Patient's sister is unable to meet today. She gets off of work tomorrow at [employer]. Niece confirms family would like to meet at 130pm. They are aware I will plan to meet them at patient's bedside.   All questions answered and support provided.   Length of Stay: 6  Current Medications: Scheduled Meds:   Chlorhexidine Gluconate Cloth  6 each Topical Daily   dexamethasone (DECADRON) injection  4 mg Intravenous Q6H   enoxaparin (LOVENOX) injection  40 mg Subcutaneous T73U   folic acid  1 mg Oral Daily   gabapentin  300 mg Oral TID   megestrol  400 mg Oral Daily   multivitamin with minerals  1 tablet Oral Daily   oxyCODONE  20 mg Oral Q8H   pantoprazole  40 mg Oral BID AC   polyethylene glycol  17 g Oral Daily   senna-docusate  1 tablet Oral BID   sodium chloride flush  10-40 mL Intracatheter Q12H   sucralfate  1 g Oral QID    tamsulosin  0.4 mg Oral QPC breakfast   [START ON 03/02/2021] thiamine  100 mg Oral Daily   traZODone  50 mg Oral QHS    Continuous Infusions:  thiamine injection 250 mg (02/28/21 0939)    PRN Meds: diphenhydrAMINE **AND** acetaminophen, [COMPLETED] acetaminophen **FOLLOWED BY** acetaminophen, HYDROmorphone (DILAUDID) injection, HYDROmorphone, LORazepam, polyethylene glycol, prochlorperazine  Physical Exam         General: somnolent,  ill-appearing   Heart: Regular rate and rhythm. No murmur appreciated. Lungsdiminished  Abdomen: Soft, nontender, nondistended, positive bowel sounds.  Ext: No significant edema Skin: Warm and dry Neuro: AAO x4 with interpretation when awaken  Vital Signs: BP 105/72 (BP Location: Left Arm)    Pulse 69    Temp 98.2 F (36.8 C) (Oral)    Resp 15    Ht 6' (1.829 m)    Wt 69.1 kg    SpO2 100%    BMI 20.67 kg/m  SpO2: SpO2: 100 % O2 Device: O2 Device: Room Air O2 Flow Rate:    Intake/output summary:  Intake/Output Summary (Last 24 hours) at 02/28/2021 1621 Last data filed at 02/28/2021 1044 Gross per 24 hour  Intake 120 ml  Output 1300 ml  Net -1180 ml    LBM: Last BM Date: 03/25/21 Baseline Weight: Weight: 69.1 kg Most recent weight: Weight: 69.1 kg       Palliative Assessment/Data:PPS 20-30%      Patient Active Problem List   Diagnosis Date Noted   Acute metabolic encephalopathy 56/38/7564   Myoclonic jerking 02/22/2021   Steroid-induced hyperglycemia 02/22/2021   Metastatic cancer ([place]) 02/21/2021   Anemia 02/21/2021   Lower extremity weakness 02/21/2021   Difficulty urinating 02/21/2021   DNR (do not resuscitate) 11/10/2020   Esophageal stricture 09/19/2020   Drug-induced neutropenia ([place]) 08/10/2020   Cancer related pain 03/22/2020   Bone metastases ([place]) 03/22/2020   Iron deficiency anemia 08/12/2019   Upper GI bleed 08/11/2019   Malignant neoplasm of esophagus ([facility])    Hematemesis with nausea 08/10/2019   Syncope, vasovagal  08/10/2019   Acute blood loss anemia 08/10/2019   Alcohol abuse     Palliative Care Assessment & Plan   Patient Profile: 46 y.o. male  with past medical history of alcohol abuse and metastatic esophageal cancer currently on palliative chemotherapy with Dr. Burr Medico admitted on 02/21/2021 with worsening hip pain related to large sacral metastatic lesion with extraosseous extension.  He had radiation to sacrum last February and plan is for further radiation therapy this admission.  Recommendations/Plan: Pain, cancer related: MAR reviewed and he has used 8 doses of IV dilaudid. Continue OxyContin 20 mg 3 times daily. Of note patient does not have health insurance. Once he is discharged home he most likely will not be able to afford OxyContin out of pocket. Given myoclonic jerking on admission when taking MS Contin, may need to consider transitioning to methadone for more financial feasibility. Will closely monitor. If patient discharges with hospice support may continue current regimen.   Overall, this is difficult to treat pain secondary to bony metastasis and would recommend continuation of steroids as well as plan for radiation therapy. Continue gabapentin  Prior to admission patient was taking oral dilaudid every 3hrs as needed. Will re-order dilaudid 2-4 mg every 3 hours PRN in anticipation of discharging home on effective regimen. He has received 2 doses of oral medication. Will evaluate IV usage over the next 24 hrs and make adjustments as necessary. He has received 6 doses of IV dilaudid over past 24 hours which is an improvement.  Anxiety/withdrawal: Continue CIWA. I have requested for family meeting to further clarify goals of care and assess knowledge of patient's poor prognosis. Given patient's ongoing pain, poor prognosis, and continued decline he would be appropriate for residential hospice vs outpatient hospice support in the home. Family is concerned about their ability to manage his pain.  Further direction and decisions once we are  able to have family meeting (tentatively on Friday).  Note Dr. Burr Medico has indicated consideration for home hospice when he completes radiation therapy.  We will continue to follow along to progress this conversation in conjunction with her over the next several days.   Code Status:    Code Status Orders  (From admission, onward)           Start     Ordered   02/21/21 1802  Do not attempt resuscitation (DNR)  Continuous       Question Answer Comment  In the event of cardiac or respiratory ARREST Do not call a code blue   In the event of cardiac or respiratory ARREST Do not perform Intubation, CPR, defibrillation or ACLS   In the event of cardiac or respiratory ARREST Use medication by any route, position, wound care, and other measures to relive pain and suffering. May use oxygen, suction and manual treatment of airway obstruction as needed for comfort.      02/21/21 1801           Code Status History     Date Active Date Inactive Code Status Order ID Comments User Context   12/28/2020 1514 02/21/2021 1238 DNR [id number]  Jimmy Footman, NP Outpatient   08/10/2019 1546 08/12/2019 1945 Full Code [id number]  Darliss Cheney, MD ED      Advance Directive Documentation    Flowsheet Row Most Recent Value  Type of Advance Directive Healthcare Power of Attorney, Living will  Pre-existing out of facility DNR order (yellow form or pink MOST form) --  "MOST" Form in Place? --       Prognosis:  POOR  Discharge Planning: To Be Determined  Care plan was discussed with patient and his family.   Thank you for allowing the Palliative Medicine Team to assist in the care of this patient.  Time Total: 25 min.   Visit consisted of counseling and education dealing with the complex and emotionally intense issues of symptom management and palliative care in the setting of serious and potentially life-threatening illness.Greater than  50%  of this time was spent counseling and coordinating care related to the above assessment and plan.  Alda Lea, AGPCNP-BC  Palliative Medicine Team

## 2021-02-28 NOTE — Progress Notes (Signed)
I spoke with the patient today via [facility] interpret[facility] services. He is doing well in tolerating radiation at this point. He is aware the results of radiation to hopefully improve his pain may not be notable clinically until conclusion of his therapy. We appreciate the palliative team's input regarding pain management and will continue radiotherapy as scheduled.      Carola Rhine, PAC

## 2021-03-01 ENCOUNTER — Inpatient Hospital Stay: Payer: Self-pay | Admitting: Nurse Practitioner

## 2021-03-01 ENCOUNTER — Inpatient Hospital Stay: Payer: Self-pay

## 2021-03-01 ENCOUNTER — Ambulatory Visit
Admit: 2021-03-01 | Discharge: 2021-03-01 | Disposition: A | Discharge status: Home / Self Care | Payer: Self-pay | Attending: Radiation Oncology | Admitting: Radiation Oncology

## 2021-03-01 ENCOUNTER — Inpatient Hospital Stay: Payer: Self-pay | Admitting: Hematology

## 2021-03-01 MED ORDER — POLYETHYLENE GLYCOL 3350 17 G PO PACK
17.0000 g | PACK | Freq: Two times a day (BID) | ORAL | Status: DC
  Administered 2021-03-01 – 2021-03-09 (×16): 17 g via ORAL
  Filled 2021-03-01 (×17): qty 1

## 2021-03-01 MED ORDER — HYDROMORPHONE HCL 1 MG/ML IJ SOLN
1.0000 mg | INTRAMUSCULAR | Status: DC | PRN
  Administered 2021-03-01 – 2021-03-07 (×21): 1 mg via INTRAVENOUS
  Filled 2021-03-01 (×23): qty 1

## 2021-03-01 NOTE — Progress Notes (Signed)
Physical Therapy Treatment Patient Details Name: Jimmy Hawkins MRN: [id number] DOB: Mar 16, 1975 Today's Date: 03/01/2021   History of Present Illness 46 yo with hx etoh abuse and metastatic esophageal cancer currently on palliative chemotherapy    PT Comments    Pt agreeable to therapy, requests to ambulate with RW. Pt c/o 5/10 R Hip pain in supine, increases to 8/10 pain with standing marching and sidesteps. Pt tolerates standing for ~2 minutes while sister changes linen. Pt slightly impulsive with bed mobility, appears to move quickly due to pain. Pt able to clear feet for standing marching and take 2 sidesteps to [facility], but unable to ambulate additional distance due to R hip pain. Pt returns to supine with all needs in reach and sister at bedside. Plan to continue progressing PT as able, limited by pt's high pain.    Recommendations for follow up therapy are one component of a multi-disciplinary discharge planning process, led by the attending physician.  Recommendations may be updated based on patient status, additional functional criteria and insurance authorization.  Follow Up Recommendations  Home health PT     Assistance Recommended at Discharge Frequent or constant Supervision/Assistance  Patient can return home with the following A little help with walking and/or transfers;A little help with bathing/dressing/bathroom;Assistance with cooking/housework;Assist for transportation;Help with stairs or ramp for entrance   Equipment Recommendations  None recommended by PT    Recommendations for Other Services       Precautions / Restrictions Precautions Precautions: Fall Precaution Comments: high pain Restrictions Weight Bearing Restrictions: No     Mobility  Bed Mobility Overal bed mobility: Modified Independent  General bed mobility comments: mod I to come to sitting EOB and return to supine, moves slightly impulsively appears to be due to pain     Transfers Overall transfer level: Needs assistance Equipment used: Rolling walker (2 wheels) Transfers: Sit to/from Stand Sit to Stand: Supervision  General transfer comment: supv for safety with power to stand, rises quickly    Ambulation/Gait Ambulation/Gait assistance: Min guard  Assistive device: Rolling walker (2 wheels)  General Gait Details: pt takes 2 steps up to [facility], increasead pain, able to perform standing marching for ~10 seconds, stands for total of ~2 minutes while sister changes linen, increase pain requiring return to supine, unable to tolerate additional ambulation   Stairs             Wheelchair Mobility    Modified Rankin (Stroke Patients Only)       Balance Overall balance assessment: Mild deficits observed, not formally tested         Cognition Arousal/Alertness: Awake/alert Behavior During Therapy: WFL for tasks assessed/performed Overall Cognitive Status: Within Functional Limits for tasks assessed       Exercises      General Comments        Pertinent Vitals/Pain Pain Assessment Pain Assessment: 0-10 Pain Score: 8  Pain Location: R hip Pain Descriptors / Indicators: Discomfort, Grimacing, Aching Pain Intervention(s): Limited activity within patient's tolerance, Monitored during session, Repositioned    Home Living                          Prior Function            PT Goals (current goals can now be found in the care plan section) Acute Rehab PT Goals Patient Stated Goal: Regain strength and IND PT Goal Formulation: With patient Time For Goal Achievement: 04/05/21 Potential to Achieve  Goals: Fair Progress towards PT goals: Progressing toward goals (slowly, limited by pain)    Frequency    Min 3X/week      PT Plan Current plan remains appropriate    Co-evaluation              AM-PAC PT "6 Clicks" Mobility   Outcome Measure  Help needed turning from your back to your side while in a flat bed  without using bedrails?: A Little Help needed moving from lying on your back to sitting on the side of a flat bed without using bedrails?: A Little Help needed moving to and from a bed to a chair (including a wheelchair)?: A Little Help needed standing up from a chair using your arms (e.g., wheelchair or bedside chair)?: A Little Help needed to walk in hospital room?: A Lot Help needed climbing 3-5 steps with a railing? : A Lot 6 Click Score: 16    End of Session Equipment Utilized During Treatment: Other (comment) (interpreter Pamala Hurry [phone]) Activity Tolerance: Patient limited by pain Patient left: in bed;with call bell/phone within reach;with family/visitor present Nurse Communication: Mobility status;Other (comment) (high pain with mobility) PT Visit Diagnosis: Muscle weakness (generalized) (M62.81);Unsteadiness on feet (R26.81);Difficulty in walking, not elsewhere classified (R26.2);Pain Pain - Right/Left: Right Pain - part of body: Hip     Time: 1150-1200 PT Time Calculation (min) (ACUTE ONLY): 10 min  Charges:  $Therapeutic Activity: 8-22 mins                     Tori Roderick Calo PT, DPT 03/01/21, 12:16 PM

## 2021-03-01 NOTE — Progress Notes (Signed)
PROGRESS NOTE    Jimmy Hawkins  ELF:[id number] DOB: 1976/01/06 DOA: 02/21/2021 PCP: [name], MD    No chief complaint on file.   Brief Narrative:  46 yo with hx etoh abuse and metastatic esophageal cancer currently on palliative chemotherapy He had significant pain,.  MRI showed a large sacral met.  He's been started on steroids and radiation therapy.  Palliative care working on pain regimen.  Per Dr. Burr Medico, likely to transition to home hospice after completion of radiation therapy.  Discharge is pending improvement in pain control.   Subjective:   Reports pain is 7 out of 10 currently ,  Family at bedside  Assessment & Plan:   Principal Problem:   Metastatic cancer ([place]) Active Problems:   Alcohol abuse   Malignant neoplasm of esophagus ([place])   Cancer related pain   Bone metastases ([place])   Anemia   Lower extremity weakness   Difficulty urinating   Acute metabolic encephalopathy   Myoclonic jerking   Steroid-induced hyperglycemia  Metastatic esophageal cancer with sacral mets -Appreciate medical oncology , radiation oncology and palliative care -palliative radiation to sacrum for pain control, on iv steroids -Appreciate palliative care for pain management -Plan for home with home hospice once completion radiation therapy and pain is better controlled, may need alternative disposition if it difficult for pain control   Difficulty urination Getting bladder scan, start Flomax, prevent constipation May need Foley catheter Denies bladder fullness, able to get up from bed use the bathroom just helpful family  Constipation Increase stool softener    Body mass index is 20.67 kg/m.Marland Kitchen  FTT , poor prognosis    Unresulted Labs (From admission, onward)    None         DVT prophylaxis: enoxaparin (LOVENOX) injection 40 mg Start: 02/25/21 2200   Code Status: DNR Family Communication: Sister at bedside Disposition:   Status is: Inpatient  Dispo:  The patient is from: Home              Anticipated d/c is to: Home with hospice  vs HH once pain is better controlled                Consultants:  Medical oncology Radiation oncology Palliative care  Procedures:  XRT to sacral bone mets  Antimicrobials:    Anti-infectives (From admission, onward)    None          Objective: Vitals:   02/28/21 0452 02/28/21 1402 02/28/21 2214 03/01/21 0352  BP: 95/63 1'05/72 98/61 96/64 '  Pulse: 70 69 82 80  Resp: '18 15 18 18  ' Temp: 98.4 F (36.9 C) 98.2 F (36.8 C) 98.6 F (37 C) 98.6 F (37 C)  TempSrc: Oral Oral Oral Oral  SpO2: 100% 100% 99% 100%  Weight:      Height:        Intake/Output Summary (Last 24 hours) at 03/01/2021 1547 Last data filed at 03/01/2021 0955 Gross per 24 hour  Intake 540 ml  Output --  Net 540 ml   Filed Weights   02/21/21 1301  Weight: 69.1 kg    Examination:  General exam: AAOx3, pleasant, NAD Respiratory system:  Respiratory effort normal. Cardiovascular system:  RRR.  Gastrointestinal system: Abdomen is nondistended, soft and nontender.  Normal bowel sounds. Central nervous system: AAO x3 Extremities:  no edema Skin: No rashes, lesions or ulcers Psychiatry: Pleasant and cooperative    Data Reviewed: I have personally reviewed following labs and imaging studies  CBC: Recent  Labs  Lab 02/23/21 0518 02/24/21 0505 02/25/21 0425  WBC 3.5* 2.6* 2.4*  NEUTROABS 3.2 2.3 2.1  HGB 8.6* 8.8* 9.0*  HCT 26.4* 27.2* 27.3*  MCV 97.1 98.2 97.5  PLT 174 177 425    Basic Metabolic Panel: Recent Labs  Lab 02/23/21 0518 02/24/21 0505 02/25/21 0425  NA 134* 132* 132*  K 3.5 3.2* 4.0  CL 103 101 102  CO2 '25 24 24  ' GLUCOSE 137* 142* 111*  BUN '9 10 11  ' CREATININE <0.30* 0.47* 0.39*  CALCIUM 8.8* 8.8* 8.9  MG 2.0 1.9 2.1  PHOS 3.5 3.0 3.6    GFR: Estimated Creatinine Clearance: 116.4 mL/min (A) (by C-G formula based on SCr of 0.39 mg/dL (L)).  Liver Function Tests: Recent Labs   Lab 02/23/21 0518 02/24/21 0505 02/25/21 0425  AST '15 18 18  ' ALT '16 19 22  ' ALKPHOS 56 54 51  BILITOT 0.5 0.5 0.5  PROT 6.6 6.2* 6.1*  ALBUMIN 3.1* 3.0* 3.0*    CBG: Recent Labs  Lab 02/22/21 1739 02/22/21 2200 02/23/21 0742 02/23/21 1214 02/23/21 1757  GLUCAP 172* 142* 142* 150* 155*     Recent Results (from the past 240 hour(s))  Resp Panel by RT-PCR (Flu A&B, Covid) Nasopharyngeal Swab     Status: None   Collection Time: 02/21/21  6:31 PM   Specimen: Nasopharyngeal Swab; Nasopharyngeal(NP) swabs in vial transport medium  Result Value Ref Range Status   SARS Coronavirus 2 by RT PCR NEGATIVE NEGATIVE Final    Comment: (NOTE) SARS-CoV-2 target nucleic acids are NOT DETECTED.  The SARS-CoV-2 RNA is generally detectable in upper respiratory specimens during the acute phase of infection. The lowest concentration of SARS-CoV-2 viral copies this assay can detect is 138 copies/mL. A negative result does not preclude SARS-Cov-2 infection and should not be used as the sole basis for treatment or other patient management decisions. A negative result may occur with  improper specimen collection/handling, submission of specimen other than nasopharyngeal swab, presence of viral mutation(s) within the areas targeted by this assay, and inadequate number of viral copies(<138 copies/mL). A negative result must be combined with clinical observations, patient history, and epidemiological information. The expected result is Negative.  Fact Sheet for Patients:  EntrepreneurPulse.com.au  Fact Sheet for Healthcare Providers:  IncredibleEmployment.be  This test is no t yet approved or cleared by the [place] FDA and  has been authorized for detection and/or diagnosis of SARS-CoV-2 by FDA under an Emergency Use Authorization (EUA). This EUA will remain  in effect (meaning this test can be used) for the duration of the COVID-19 declaration under  Section 564(b)(1) of the Act, 21 U.S.C.section 360bbb-3(b)(1), unless the authorization is terminated  or revoked sooner.       Influenza A by PCR NEGATIVE NEGATIVE Final   Influenza B by PCR NEGATIVE NEGATIVE Final    Comment: (NOTE) The Xpert Xpress SARS-CoV-2/FLU/RSV plus assay is intended as an aid in the diagnosis of influenza from Nasopharyngeal swab specimens and should not be used as a sole basis for treatment. Nasal washings and aspirates are unacceptable for Xpert Xpress SARS-CoV-2/FLU/RSV testing.  Fact Sheet for Patients: EntrepreneurPulse.com.au  Fact Sheet for Healthcare Providers: IncredibleEmployment.be  This test is not yet approved or cleared by the [place] FDA and has been authorized for detection and/or diagnosis of SARS-CoV-2 by FDA under an Emergency Use Authorization (EUA). This EUA will remain in effect (meaning this test can be used) for the duration of the COVID-19 declaration  under Section 564(b)(1) of the Act, 21 U.S.C. section 360bbb-3(b)(1), unless the authorization is terminated or revoked.  Performed at [facility] [place] [address] [place], [place] [address]          Radiology Studies: No results found.      Scheduled Meds:  Chlorhexidine Gluconate Cloth  6 each Topical Daily   dexamethasone (DECADRON) injection  4 mg Intravenous Q6H   enoxaparin (LOVENOX) injection  40 mg Subcutaneous H65B   folic acid  1 mg Oral Daily   gabapentin  300 mg Oral TID   megestrol  400 mg Oral Daily   multivitamin with minerals  1 tablet Oral Daily   oxyCODONE  20 mg Oral Q8H   pantoprazole  40 mg Oral BID AC   polyethylene glycol  17 g Oral Daily   senna-docusate  1 tablet Oral BID   sodium chloride flush  10-40 mL Intracatheter Q12H   sucralfate  1 g Oral QID   tamsulosin  0.4 mg Oral QPC breakfast   [START ON 03/02/2021] thiamine  100 mg Oral Daily   traZODone  50 mg Oral QHS    Continuous Infusions:     LOS: 7 days    Voice Recognition /Dragon dictation system was used to create this note, attempts have been made to correct errors. Please contact the author with questions and/or clarifications.   Florencia Reasons, MD PhD FACP [facility]  Available via Epic secure chat 7am-7pm for nonurgent issues Please page for urgent issues To page the attending provider between 7A-7P or the covering provider during after hours 7P-7A, please log into the web site [email or web address] and access using universal [place] password for that web site. If you do not have the password, please call the hospital operator.    03/01/2021, 3:47 PM

## 2021-03-01 NOTE — Progress Notes (Signed)
Daily Progress Note   Patient Name: Jimmy Hawkins       Date: 03/01/2021 DOB: [birth year]  Age: 46 y.o. MRN#: [id number] Attending Physician: Florencia Reasons, MD Primary Care Physician: [name], MD Admit Date: 02/21/2021  Reason for Consultation/Follow-up: Establishing goals of care and Pain control  Subjective: Patient resting in bed. Appears comfortable. Much more awake and interactive. His wife and sister at the bedside for family meeting. Interpretor Ipad used ([place] ID [id number]).   We discussed patient's current pain regimen. His pain is well managed. Markese states he has some breakthrough pain but not as bad as it was this time last week. Education provided on regimen with plans to continue at home. We will increase frequency of IV dilaudid to allow weaning back to oral regimen. Fasily verbalized understanding.   We discussed at length goals of care. Patient and family clear on expressed wishes to continue radiation treatments and allow patient every opportunity available. I encouraged discussions regarding comfort focused care once radiation treatments were completed. Patient and family expressed awareness and expectations in the future.   Wife shares concerns of patient discharging home given his ongoing weakness and ability to come back and forth daily for radiation treatments. We discussed patient's treatment ends on 2/17. Discussed wishes for Raylyn to return home. They would like to hopefully remain in the hospital to allow him an opportunity to complete majority of his treatment. They would feel more comfortable with him returning home on next weekend (2/11) which would give them time to arrange additional support. We discussed goals once he returns home and continuing with last 5  radiation treatments. Family will make attempts to bring him in the final week however if unable to do so they would be at peace and comfortable not completing at that time.   Chayson and his family verbalized appreciation for all of the continued support and allowing him the opportunity to thrive for as long as he can. We discussed PT evaluating patient and patient getting out of bed some throughout the day with understanding he cannot sit up long. Recommended possibly sitting in recliner with feet and head back to relieve sacral pressure.   His appetite is good.   All questions answered and support provided.   Length of Stay: 7  Current Medications: Scheduled Meds:  Chlorhexidine Gluconate Cloth  6 each Topical Daily   dexamethasone (DECADRON) injection  4 mg Intravenous Q6H   enoxaparin (LOVENOX) injection  40 mg Subcutaneous H60V   folic acid  1 mg Oral Daily   gabapentin  300 mg Oral TID   megestrol  400 mg Oral Daily   multivitamin with minerals  1 tablet Oral Daily   oxyCODONE  20 mg Oral Q8H   pantoprazole  40 mg Oral BID AC   polyethylene glycol  17 g Oral BID   senna-docusate  1 tablet Oral BID   sodium chloride flush  10-40 mL Intracatheter Q12H   sucralfate  1 g Oral QID   tamsulosin  0.4 mg Oral QPC breakfast   [START ON 03/02/2021] thiamine  100 mg Oral Daily   traZODone  50 mg Oral QHS    Continuous Infusions:    PRN Meds: diphenhydrAMINE **AND** acetaminophen, [COMPLETED] acetaminophen **FOLLOWED BY** acetaminophen, HYDROmorphone (DILAUDID) injection, HYDROmorphone, LORazepam, polyethylene glycol, prochlorperazine  Physical Exam         General: NAD, awake and alert Heart: Regular rate and rhythm. No murmur appreciated. Lungs: diminished  Abdomen: Soft, nontender, nondistended, positive bowel sounds.  Ext: No significant edema Skin: Warm and dry Neuro: AAO x4 with interpretation when awaken  Vital Signs: BP 97/66 (BP Location: Left Arm)    Pulse 76    Temp  98.4 F (36.9 C) (Oral)    Resp 18    Ht 6' (1.829 m)    Wt 69.1 kg    SpO2 99%    BMI 20.67 kg/m  SpO2: SpO2: 99 % O2 Device: O2 Device: Room Air O2 Flow Rate:    Intake/output summary:  Intake/Output Summary (Last 24 hours) at 03/01/2021 2129 Last data filed at 03/01/2021 2103 Gross per 24 hour  Intake 910 ml  Output --  Net 910 ml    LBM: Last BM Date: 02/26/21 Baseline Weight: Weight: 69.1 kg Most recent weight: Weight: 69.1 kg       Palliative Assessment/Data:PPS 20-30%      Patient Active Problem List   Diagnosis Date Noted   Acute metabolic encephalopathy 37/10/6267   Myoclonic jerking 02/22/2021   Steroid-induced hyperglycemia 02/22/2021   Metastatic cancer ([place]) 02/21/2021   Anemia 02/21/2021   Lower extremity weakness 02/21/2021   Difficulty urinating 02/21/2021   DNR (do not resuscitate) 11/10/2020   Esophageal stricture 09/19/2020   Drug-induced neutropenia ([place]) 08/10/2020   Cancer related pain 03/22/2020   Bone metastases ([place]) 03/22/2020   Iron deficiency anemia 08/12/2019   Upper GI bleed 08/11/2019   Malignant neoplasm of esophagus ([facility])    Hematemesis with nausea 08/10/2019   Syncope, vasovagal 08/10/2019   Acute blood loss anemia 08/10/2019   Alcohol abuse     Palliative Care Assessment & Plan   Patient Profile: 46 y.o. male  with past medical history of alcohol abuse and metastatic esophageal cancer currently on palliative chemotherapy with Dr. Burr Medico admitted on 02/21/2021 with worsening hip pain related to large sacral metastatic lesion with extraosseous extension.  He had radiation to sacrum last February and plan is for further radiation therapy this admission.  Recommendations/Plan: Pain, cancer related: MAR reviewed and he has used 8 doses of IV dilaudid. Continue OxyContin 20 mg 3 times daily. Of note patient does not have health insurance. Once he is discharged home he most likely will not be able to afford OxyContin out of pocket. Given  myoclonic jerking on admission when taking MS Contin, may  need to consider transitioning to methadone for more financial feasibility. Will closely monitor. If patient discharges with hospice support may continue current regimen.   Overall, this is difficult to treat pain secondary to bony metastasis and would recommend continuation of steroids as well as plan for radiation therapy. Continue gabapentin  Prior to admission patient was taking oral dilaudid every 3hrs as needed. Will re-order dilaudid 2-4 mg every 3 hours PRN in anticipation of discharging home on effective regimen. He has received 2 doses of oral medication. Will evaluate IV usage over the next 24 hrs and make adjustments as necessary. He has received 6 doses of IV dilaudid over past 24 hours which is an improvement.  Anxiety/withdrawal: Continue CIWA. Family meeting today with patient, wife, and his sister. They were clear in expressed goals to continue radiation treatment with appropriate concerns regarding transporting [place] daily for treatments. They are in agreement to continue treatments throughout next week if possible with a goal of discharging home the weekend. At that time family will have solicited additional family support with plans to try and get patient here daily for final 5 treatments. If patient does not tolerate or unable to return daily family and patient would be at peace with not completing final treatments and focusing on his comfort at that time.    Note Dr. Burr Medico has indicated consideration for home hospice when he completes radiation therapy.  We will continue to follow along to progress this conversation in conjunction with her over the next several days.   Code Status:    Code Status Orders  (From admission, onward)           Start     Ordered   02/21/21 1802  Do not attempt resuscitation (DNR)  Continuous       Question Answer Comment  In the event of cardiac or respiratory ARREST Do not call a code  blue   In the event of cardiac or respiratory ARREST Do not perform Intubation, CPR, defibrillation or ACLS   In the event of cardiac or respiratory ARREST Use medication by any route, position, wound care, and other measures to relive pain and suffering. May use oxygen, suction and manual treatment of airway obstruction as needed for comfort.      02/21/21 1801           Code Status History     Date Active Date Inactive Code Status Order ID Comments User Context   12/28/2020 1514 02/21/2021 1238 DNR [id number]  Jimmy Footman, NP Outpatient   08/10/2019 1546 08/12/2019 1945 Full Code [id number]  Darliss Cheney, MD ED      Advance Directive Documentation    Flowsheet Row Most Recent Value  Type of Advance Directive Healthcare Power of Attorney, Living will  Pre-existing out of facility DNR order (yellow form or pink MOST form) --  "MOST" Form in Place? --       Prognosis:  POOR  Discharge Planning: To Be Determined  Care plan was discussed with patient and his family.   Thank you for allowing the Palliative Medicine Team to assist in the care of this patient.  Time Total: 65 min.   Visit consisted of counseling and education dealing with the complex and emotionally intense issues of symptom management and palliative care in the setting of serious and potentially life-threatening illness.Greater than 50%  of this time was spent counseling and coordinating care related to the above assessment and plan.  Alda Lea, AGPCNP-BC  Palliative  Medicine Team [phone]

## 2021-03-02 MED ORDER — BISACODYL 10 MG RE SUPP
10.0000 mg | Freq: Every day | RECTAL | Status: AC
  Filled 2021-03-02 (×2): qty 1

## 2021-03-02 NOTE — Progress Notes (Signed)
PROGRESS NOTE    Jimmy Hawkins  BZX:[id number] DOB: Nov 14, 1975 DOA: 02/21/2021 PCP: [name], MD    No chief complaint on file.   Brief Narrative:  46 yo with hx etoh abuse and metastatic esophageal cancer currently on palliative chemotherapy He had significant pain,.  MRI showed a large sacral met.  He's been started on steroids and radiation therapy.  Palliative care working on pain regimen.  Per Dr. Burr Medico, likely to transition to home hospice after completion of radiation therapy.  Discharge is pending improvement in pain control.   Subjective:  He is smiling, does not appear in acute distress Reports epigastric pain and back pain ,  Family at bedside  Assessment & Plan:   Principal Problem:   Metastatic cancer ([place]) Active Problems:   Alcohol abuse   Malignant neoplasm of esophagus ([place])   Cancer related pain   Bone metastases ([place])   Anemia   Lower extremity weakness   Difficulty urinating   Acute metabolic encephalopathy   Myoclonic jerking   Steroid-induced hyperglycemia  Metastatic esophageal cancer with sacral mets -Appreciate medical oncology , radiation oncology and palliative care -palliative radiation to sacrum for pain control, on iv steroids, PPI and carafate  -Appreciate palliative care for pain management -Plan for home with home hospice once completion radiation therapy and pain is better controlled   Difficulty urination Getting bladder scan, start Flomax, prevent constipation Denies bladder fullness, able to get up from bed to use the bathroom with the help of  family  Constipation Increase stool softener, add suppository     Body mass index is 20.67 kg/m.Marland Kitchen  FTT , poor prognosis    Unresulted Labs (From admission, onward)    None         DVT prophylaxis: enoxaparin (LOVENOX) injection 40 mg Start: 02/25/21 2200   Code Status: DNR Family Communication: Sister at bedside Disposition:   Status is: Inpatient  Dispo:  The patient is from: Home              Anticipated d/c is to: Home with hospice  vs HH once pain is better controlled                Consultants:  Medical oncology Radiation oncology Palliative care  Procedures:  XRT to sacral bone mets  Antimicrobials:    Anti-infectives (From admission, onward)    None          Objective: Vitals:   03/01/21 2103 03/02/21 0455 03/02/21 0846 03/02/21 1403  BP: 97/66 98/60 101/73 103/73  Pulse: 76 73 87 72  Resp: '18 17 15 18  ' Temp: 98.4 F (36.9 C) 98.2 F (36.8 C) 98.6 F (37 C) (!) 97.5 F (36.4 C)  TempSrc: Oral Oral Oral Oral  SpO2: 99% 100% 99% 98%  Weight:      Height:        Intake/Output Summary (Last 24 hours) at 03/02/2021 1503 Last data filed at 03/01/2021 2230 Gross per 24 hour  Intake 250 ml  Output 700 ml  Net -450 ml   Filed Weights   02/21/21 1301  Weight: 69.1 kg    Examination:  General exam: AAOx3, pleasant, NAD Respiratory system:  Respiratory effort normal. Cardiovascular system:  RRR.  Gastrointestinal system: Abdomen is nondistended, soft and nontender.  Normal bowel sounds. Central nervous system: AAO x3 Extremities:  no edema Skin: No rashes, lesions or ulcers Psychiatry: Pleasant and cooperative    Data Reviewed: I have personally reviewed following labs and  imaging studies  CBC: Recent Labs  Lab 02/24/21 0505 02/25/21 0425  WBC 2.6* 2.4*  NEUTROABS 2.3 2.1  HGB 8.8* 9.0*  HCT 27.2* 27.3*  MCV 98.2 97.5  PLT 177 572    Basic Metabolic Panel: Recent Labs  Lab 02/24/21 0505 02/25/21 0425  NA 132* 132*  K 3.2* 4.0  CL 101 102  CO2 24 24  GLUCOSE 142* 111*  BUN 10 11  CREATININE 0.47* 0.39*  CALCIUM 8.8* 8.9  MG 1.9 2.1  PHOS 3.0 3.6    GFR: Estimated Creatinine Clearance: 116.4 mL/min (A) (by C-G formula based on SCr of 0.39 mg/dL (L)).  Liver Function Tests: Recent Labs  Lab 02/24/21 0505 02/25/21 0425  AST 18 18  ALT 19 22  ALKPHOS 54 51  BILITOT 0.5 0.5   PROT 6.2* 6.1*  ALBUMIN 3.0* 3.0*    CBG: Recent Labs  Lab 02/23/21 1757  GLUCAP 155*     Recent Results (from the past 240 hour(s))  Resp Panel by RT-PCR (Flu A&B, Covid) Nasopharyngeal Swab     Status: None   Collection Time: 02/21/21  6:31 PM   Specimen: Nasopharyngeal Swab; Nasopharyngeal(NP) swabs in vial transport medium  Result Value Ref Range Status   SARS Coronavirus 2 by RT PCR NEGATIVE NEGATIVE Final    Comment: (NOTE) SARS-CoV-2 target nucleic acids are NOT DETECTED.  The SARS-CoV-2 RNA is generally detectable in upper respiratory specimens during the acute phase of infection. The lowest concentration of SARS-CoV-2 viral copies this assay can detect is 138 copies/mL. A negative result does not preclude SARS-Cov-2 infection and should not be used as the sole basis for treatment or other patient management decisions. A negative result may occur with  improper specimen collection/handling, submission of specimen other than nasopharyngeal swab, presence of viral mutation(s) within the areas targeted by this assay, and inadequate number of viral copies(<138 copies/mL). A negative result must be combined with clinical observations, patient history, and epidemiological information. The expected result is Negative.  Fact Sheet for Patients:  EntrepreneurPulse.com.au  Fact Sheet for Healthcare Providers:  IncredibleEmployment.be  This test is no t yet approved or cleared by the [place] FDA and  has been authorized for detection and/or diagnosis of SARS-CoV-2 by FDA under an Emergency Use Authorization (EUA). This EUA will remain  in effect (meaning this test can be used) for the duration of the COVID-19 declaration under Section 564(b)(1) of the Act, 21 U.S.C.section 360bbb-3(b)(1), unless the authorization is terminated  or revoked sooner.       Influenza A by PCR NEGATIVE NEGATIVE Final   Influenza B by PCR NEGATIVE  NEGATIVE Final    Comment: (NOTE) The Xpert Xpress SARS-CoV-2/FLU/RSV plus assay is intended as an aid in the diagnosis of influenza from Nasopharyngeal swab specimens and should not be used as a sole basis for treatment. Nasal washings and aspirates are unacceptable for Xpert Xpress SARS-CoV-2/FLU/RSV testing.  Fact Sheet for Patients: EntrepreneurPulse.com.au  Fact Sheet for Healthcare Providers: IncredibleEmployment.be  This test is not yet approved or cleared by the [place] FDA and has been authorized for detection and/or diagnosis of SARS-CoV-2 by FDA under an Emergency Use Authorization (EUA). This EUA will remain in effect (meaning this test can be used) for the duration of the COVID-19 declaration under Section 564(b)(1) of the Act, 21 U.S.C. section 360bbb-3(b)(1), unless the authorization is terminated or revoked.  Performed at [facility], [place] [address] [place], [place] [address]  Radiology Studies: No results found.      Scheduled Meds:  bisacodyl  10 mg Rectal Daily   Chlorhexidine Gluconate Cloth  6 each Topical Daily   dexamethasone (DECADRON) injection  4 mg Intravenous Q6H   enoxaparin (LOVENOX) injection  40 mg Subcutaneous Z96D   folic acid  1 mg Oral Daily   gabapentin  300 mg Oral TID   megestrol  400 mg Oral Daily   multivitamin with minerals  1 tablet Oral Daily   oxyCODONE  20 mg Oral Q8H   pantoprazole  40 mg Oral BID AC   polyethylene glycol  17 g Oral BID   senna-docusate  1 tablet Oral BID   sodium chloride flush  10-40 mL Intracatheter Q12H   sucralfate  1 g Oral QID   tamsulosin  0.4 mg Oral QPC breakfast   thiamine  100 mg Oral Daily   traZODone  50 mg Oral QHS   Continuous Infusions:     LOS: 8 days    Voice Recognition /Dragon dictation system was used to create this note, attempts have been made to correct errors. Please contact the author with  questions and/or clarifications.   Florencia Reasons, MD PhD FACP [facility]  Available via Epic secure chat 7am-7pm for nonurgent issues Please page for urgent issues To page the attending provider between 7A-7P or the covering provider during after hours 7P-7A, please log into the web site [email or web address] and access using universal [place] password for that web site. If you do not have the password, please call the hospital operator.    03/02/2021, 3:03 PM

## 2021-03-03 NOTE — Progress Notes (Signed)
PROGRESS NOTE    Jimmy Hawkins  KBT:[id number] DOB: Jul 31, 1975 DOA: 02/21/2021 PCP: [name], MD    No chief complaint on file.   Brief Narrative:  46 yo with hx etoh abuse and metastatic esophageal cancer currently on palliative chemotherapy He had significant pain,.  MRI showed a large sacral met.  He's been started on steroids and radiation therapy.  Palliative care working on pain regimen.  Per Dr. Burr Medico, likely to transition to home hospice after completion of radiation therapy.  Discharge is pending improvement in pain control.   Subjective:  He is smiling, does not appear in acute distress Reports epigastric pain and back pain ,  He is able to stand up briefly with the help of family Family report he got shaky after afternoon Decadron dose   Assessment & Plan:   Principal Problem:   Metastatic cancer ([place]) Active Problems:   Alcohol abuse   Malignant neoplasm of esophagus ([place])   Cancer related pain   Bone metastases ([place])   Anemia   Lower extremity weakness   Difficulty urinating   Acute metabolic encephalopathy   Myoclonic jerking   Steroid-induced hyperglycemia  Metastatic esophageal cancer with sacral mets -Appreciate medical oncology , radiation oncology and palliative care -palliative radiation to sacrum for pain control, on iv steroids, PPI and carafate  -Appreciate palliative care for pain management -Plan for home with home hospice once completion radiation therapy and pain is better controlled   Difficulty urination Getting bladder scan, start Flomax, prevent constipation Denies bladder fullness, able to get up from bed to use the bathroom with the help of  family  Constipation Increase stool softener, add suppository     Body mass index is 20.67 kg/m.Marland Kitchen  FTT , poor prognosis    Unresulted Labs (From admission, onward)    None         DVT prophylaxis: enoxaparin (LOVENOX) injection 40 mg Start: 02/25/21 2200   Code  Status: DNR Family Communication: Sister at bedside Disposition:   Status is: Inpatient  Dispo: The patient is from: Home              Anticipated d/c is to: Home with hospice  vs HH once pain is better controlled                Consultants:  Medical oncology Radiation oncology Palliative care  Procedures:  XRT to sacral bone mets  Antimicrobials:    Anti-infectives (From admission, onward)    None          Objective: Vitals:   03/02/21 1403 03/02/21 2135 03/03/21 0553 03/03/21 1314  BP: 103/73 112/71 114/77 101/67  Pulse: 72 69 76 78  Resp: '18 18 18 16  ' Temp: (!) 97.5 F (36.4 C) 98.3 F (36.8 C) 97.6 F (36.4 C) 98.4 F (36.9 C)  TempSrc: Oral Oral Oral Oral  SpO2: 98% 99% 100% 99%  Weight:      Height:        Intake/Output Summary (Last 24 hours) at 03/03/2021 1614 Last data filed at 03/03/2021 1139 Gross per 24 hour  Intake 250 ml  Output --  Net 250 ml   Filed Weights   02/21/21 1301  Weight: 69.1 kg    Examination:  General exam: AAOx3, pleasant, NAD Respiratory system:  Respiratory effort normal. Cardiovascular system:  RRR.  Gastrointestinal system: Abdomen is nondistended, soft and nontender.  Normal bowel sounds. Central nervous system: AAO x3 Extremities:  no edema Skin: No rashes, lesions  or ulcers Psychiatry: Pleasant and cooperative    Data Reviewed: I have personally reviewed following labs and imaging studies  CBC: Recent Labs  Lab 02/25/21 0425  WBC 2.4*  NEUTROABS 2.1  HGB 9.0*  HCT 27.3*  MCV 97.5  PLT 539    Basic Metabolic Panel: Recent Labs  Lab 02/25/21 0425  NA 132*  K 4.0  CL 102  CO2 24  GLUCOSE 111*  BUN 11  CREATININE 0.39*  CALCIUM 8.9  MG 2.1  PHOS 3.6    GFR: Estimated Creatinine Clearance: 116.4 mL/min (A) (by C-G formula based on SCr of 0.39 mg/dL (L)).  Liver Function Tests: Recent Labs  Lab 02/25/21 0425  AST 18  ALT 22  ALKPHOS 51  BILITOT 0.5  PROT 6.1*  ALBUMIN 3.0*     CBG: No results for input(s): GLUCAP in the last 168 hours.    Recent Results (from the past 240 hour(s))  Resp Panel by RT-PCR (Flu A&B, Covid) Nasopharyngeal Swab     Status: None   Collection Time: 02/21/21  6:31 PM   Specimen: Nasopharyngeal Swab; Nasopharyngeal(NP) swabs in vial transport medium  Result Value Ref Range Status   SARS Coronavirus 2 by RT PCR NEGATIVE NEGATIVE Final    Comment: (NOTE) SARS-CoV-2 target nucleic acids are NOT DETECTED.  The SARS-CoV-2 RNA is generally detectable in upper respiratory specimens during the acute phase of infection. The lowest concentration of SARS-CoV-2 viral copies this assay can detect is 138 copies/mL. A negative result does not preclude SARS-Cov-2 infection and should not be used as the sole basis for treatment or other patient management decisions. A negative result may occur with  improper specimen collection/handling, submission of specimen other than nasopharyngeal swab, presence of viral mutation(s) within the areas targeted by this assay, and inadequate number of viral copies(<138 copies/mL). A negative result must be combined with clinical observations, patient history, and epidemiological information. The expected result is Negative.  Fact Sheet for Patients:  EntrepreneurPulse.com.au  Fact Sheet for Healthcare Providers:  IncredibleEmployment.be  This test is no t yet approved or cleared by the [place] FDA and  has been authorized for detection and/or diagnosis of SARS-CoV-2 by FDA under an Emergency Use Authorization (EUA). This EUA will remain  in effect (meaning this test can be used) for the duration of the COVID-19 declaration under Section 564(b)(1) of the Act, 21 U.S.C.section 360bbb-3(b)(1), unless the authorization is terminated  or revoked sooner.       Influenza A by PCR NEGATIVE NEGATIVE Final   Influenza B by PCR NEGATIVE NEGATIVE Final    Comment:  (NOTE) The Xpert Xpress SARS-CoV-2/FLU/RSV plus assay is intended as an aid in the diagnosis of influenza from Nasopharyngeal swab specimens and should not be used as a sole basis for treatment. Nasal washings and aspirates are unacceptable for Xpert Xpress SARS-CoV-2/FLU/RSV testing.  Fact Sheet for Patients: EntrepreneurPulse.com.au  Fact Sheet for Healthcare Providers: IncredibleEmployment.be  This test is not yet approved or cleared by the [place] FDA and has been authorized for detection and/or diagnosis of SARS-CoV-2 by FDA under an Emergency Use Authorization (EUA). This EUA will remain in effect (meaning this test can be used) for the duration of the COVID-19 declaration under Section 564(b)(1) of the Act, 21 U.S.C. section 360bbb-3(b)(1), unless the authorization is terminated or revoked.  Performed at [facility], [place] [address], [place], [place] [address]          Radiology Studies: No results found.  Scheduled Meds:  bisacodyl  10 mg Rectal Daily   Chlorhexidine Gluconate Cloth  6 each Topical Daily   dexamethasone (DECADRON) injection  4 mg Intravenous Q6H   enoxaparin (LOVENOX) injection  40 mg Subcutaneous F16B   folic acid  1 mg Oral Daily   gabapentin  300 mg Oral TID   megestrol  400 mg Oral Daily   multivitamin with minerals  1 tablet Oral Daily   oxyCODONE  20 mg Oral Q8H   pantoprazole  40 mg Oral BID AC   polyethylene glycol  17 g Oral BID   senna-docusate  1 tablet Oral BID   sodium chloride flush  10-40 mL Intracatheter Q12H   sucralfate  1 g Oral QID   tamsulosin  0.4 mg Oral QPC breakfast   thiamine  100 mg Oral Daily   traZODone  50 mg Oral QHS   Continuous Infusions:     LOS: 9 days    Voice Recognition /Dragon dictation system was used to create this note, attempts have been made to correct errors. Please contact the author with questions and/or  clarifications.   Florencia Reasons, MD PhD FACP [facility]  Available via Epic secure chat 7am-7pm for nonurgent issues Please page for urgent issues To page the attending provider between 7A-7P or the covering provider during after hours 7P-7A, please log into the web site [email or web address] and access using universal [place] password for that web site. If you do not have the password, please call the hospital operator.    03/03/2021, 4:14 PM

## 2021-03-04 ENCOUNTER — Inpatient Hospital Stay: Payer: Self-pay

## 2021-03-04 ENCOUNTER — Ambulatory Visit
Admit: 2021-03-04 | Discharge: 2021-03-04 | Disposition: A | Discharge status: Home / Self Care | Payer: Self-pay | Attending: Radiation Oncology | Admitting: Radiation Oncology

## 2021-03-04 NOTE — Plan of Care (Signed)
°  Problem: Clinical Measurements: Goal: Will remain free from infection Outcome: Not Progressing   Problem: Activity: Goal: Risk for activity intolerance will decrease Outcome: Not Progressing   Problem: Nutrition: Goal: Adequate nutrition will be maintained Outcome: Not Progressing   Problem: Coping: Goal: Level of anxiety will decrease Outcome: Not Progressing   Problem: Pain Managment: Goal: General experience of comfort will improve Outcome: Not Progressing

## 2021-03-04 NOTE — Progress Notes (Signed)
Physical Therapy Treatment Patient Details Name: Jimmy Hawkins MRN: [id number] DOB: 04-May-1975 Today's Date: 03/04/2021   History of Present Illness 46 yo with hx etoh abuse and metastatic esophageal cancer currently on palliative chemotherapy    PT Comments    Patient progressing but remains limited by pain. Noted to have posterior and Rt lean with gait and close guard with light assist to steady required. Patient ambulated short distance and EOS educated on supine exercises for LE strengthening. Acute PT will continue to progress as able.    Recommendations for follow up therapy are one component of a multi-disciplinary discharge planning process, led by the attending physician.  Recommendations may be updated based on patient status, additional functional criteria and insurance authorization.  Follow Up Recommendations  Home health PT     Assistance Recommended at Discharge Frequent or constant Supervision/Assistance  Patient can return home with the following A little help with walking and/or transfers;A little help with bathing/dressing/bathroom;Assistance with cooking/housework;Assist for transportation;Help with stairs or ramp for entrance   Equipment Recommendations  None recommended by PT    Recommendations for Other Services       Precautions / Restrictions Precautions Precautions: Fall Precaution Comments: high pain Restrictions Weight Bearing Restrictions: No     Mobility  Bed Mobility Overal bed mobility: Needs Assistance Bed Mobility: Sit to Supine, Supine to Sit     Supine to sit: Supervision Sit to supine: Supervision   General bed mobility comments: supervision for safety, pt moves quickly.    Transfers Overall transfer level: Needs assistance Equipment used: Rolling walker (2 wheels) Transfers: Sit to/from Stand Sit to Stand: Min guard           General transfer comment: guarding for safety, pt quick to move, slight posterior and  Rt lean on rise.    Ambulation/Gait Ambulation/Gait assistance: Min guard, Min assist Gait Distance (Feet): 25 Feet Assistive device: Rolling walker (2 wheels) Gait Pattern/deviations: Step-through pattern, Decreased step length - right, Decreased step length - left, Decreased stride length, Drifts right/left, Staggering right Gait velocity: fair     General Gait Details: pt with slight posterior and Rt lean in standing and with gait. min assist to steady. distance limited by pain   Stairs             Wheelchair Mobility    Modified Rankin (Stroke Patients Only)       Balance Overall balance assessment: Needs assistance Sitting-balance support: No upper extremity supported, Feet supported Sitting balance-Leahy Scale: Good   Postural control: Posterior lean, Right lateral lean Standing balance support: Bilateral upper extremity supported, During functional activity Standing balance-Leahy Scale: Fair Standing balance comment: slight posterior and Rt lean in standing and with gait                            Cognition Arousal/Alertness: Awake/alert Behavior During Therapy: WFL for tasks assessed/performed Overall Cognitive Status: Within Functional Limits for tasks assessed                                 General Comments: Pleasnat and slert/oriented. pt's niece in room to translate        Exercises General Exercises - Lower Extremity Quad Sets: AROM, Strengthening, Both, Supine, 10 reps (2x5) Straight Leg Raises: AROM, Both, 10 reps, Supine (2x5) Hip Flexion/Marching: Supine, AROM, Both, 10 reps    General Comments  Pertinent Vitals/Pain Pain Assessment Pain Assessment: Faces Faces Pain Scale: Hurts even more Pain Location: R hip Pain Descriptors / Indicators: Discomfort, Grimacing, Aching Pain Intervention(s): Monitored during session, Limited activity within patient's tolerance, Repositioned    Home Living                           Prior Function            PT Goals (current goals can now be found in the care plan section) Acute Rehab PT Goals Patient Stated Goal: Regain strength and IND PT Goal Formulation: With patient Time For Goal Achievement: 04/05/21 Potential to Achieve Goals: Fair Progress towards PT goals: Progressing toward goals    Frequency    Min 3X/week      PT Plan Current plan remains appropriate    Co-evaluation              AM-PAC PT "6 Clicks" Mobility   Outcome Measure  Help needed turning from your back to your side while in a flat bed without using bedrails?: A Little Help needed moving from lying on your back to sitting on the side of a flat bed without using bedrails?: A Little Help needed moving to and from a bed to a chair (including a wheelchair)?: A Little Help needed standing up from a chair using your arms (e.g., wheelchair or bedside chair)?: A Little Help needed to walk in hospital room?: A Little Help needed climbing 3-5 steps with a railing? : A Lot 6 Click Score: 17    End of Session Equipment Utilized During Treatment: Gait belt Activity Tolerance: Patient tolerated treatment well;Patient limited by pain Patient left: in bed;with call bell/phone within reach;with bed alarm set;with family/visitor present Nurse Communication: Mobility status PT Visit Diagnosis: Muscle weakness (generalized) (M62.81);Unsteadiness on feet (R26.81);Difficulty in walking, not elsewhere classified (R26.2);Pain Pain - Right/Left: Right Pain - part of body: Hip     Time: 3875-6433 PT Time Calculation (min) (ACUTE ONLY): 15 min  Charges:  $Therapeutic Exercise: 8-22 mins                     Verner Mould, DPT Acute Rehabilitation Services Office [phone] Pager [phone]    Jacques Navy 03/04/2021, 1:39 PM

## 2021-03-04 NOTE — Progress Notes (Signed)
Daily Progress Note   Patient Name: Jimmy Hawkins       Date: 03/04/2021 DOB: 06-22-1975  Age: 46 y.o. MRN#: [id number] Attending Physician: Geradine Girt, DO Primary Care Physician: Jimmy Merle, MD Admit Date: 02/21/2021  Reason for Consultation/Follow-up: Establishing goals of care and Pain control  Subjective: Chart Reviewed. Patient assessed at the bedside.   Jimmy Hawkins is awake. Appears comfortable. States he has some pain but it is manageable and much improved. Is hopeful he can go home by the weekend. His niece and nephew are at the bedside. Patient is eating food his family brought in from home. Continues to work with PT with a goal of regaining some strength to get home and hopefully finish out his final week of radiation.   Pain is well controlled on current regimen. No changes.   All questions answered and support provided.   Length of Stay: 10  Current Medications: Scheduled Meds:   Chlorhexidine Gluconate Cloth  6 each Topical Daily   dexamethasone (DECADRON) injection  4 mg Intravenous Q6H   enoxaparin (LOVENOX) injection  40 mg Subcutaneous K08U   folic acid  1 mg Oral Daily   gabapentin  300 mg Oral TID   megestrol  400 mg Oral Daily   multivitamin with minerals  1 tablet Oral Daily   oxyCODONE  20 mg Oral Q8H   pantoprazole  40 mg Oral BID AC   polyethylene glycol  17 g Oral BID   senna-docusate  1 tablet Oral BID   sodium chloride flush  10-40 mL Intracatheter Q12H   sucralfate  1 g Oral QID   tamsulosin  0.4 mg Oral QPC breakfast   thiamine  100 mg Oral Daily   traZODone  50 mg Oral QHS    Continuous Infusions:    PRN Meds: diphenhydrAMINE **AND** acetaminophen, [COMPLETED] acetaminophen **FOLLOWED BY** acetaminophen, HYDROmorphone (DILAUDID)  injection, HYDROmorphone, polyethylene glycol, prochlorperazine  Physical Exam         General: NAD, awake and alert Heart: Regular rate and rhythm. No murmur appreciated. Lungs: diminished  Abdomen: Soft, nontender, nondistended, positive bowel sounds.  Ext: No significant edema Skin: Warm and dry Neuro: AAO x4 with interpretation when awaken  Vital Signs: BP 107/71 (BP Location: Left Arm)    Pulse 82    Temp 98.3 F (  36.8 C) (Oral)    Resp 18    Ht 6' (1.829 m)    Wt 69.1 kg    SpO2 99%    BMI 20.67 kg/m  SpO2: SpO2: 99 % O2 Device: O2 Device: Room Air O2 Flow Rate:    Intake/output summary:  Intake/Output Summary (Last 24 hours) at 03/04/2021 2224 Last data filed at 03/04/2021 1901 Gross per 24 hour  Intake 180 ml  Output 500 ml  Net -320 ml    LBM: Last BM Date: 03/02/21 Baseline Weight: Weight: 69.1 kg Most recent weight: Weight: 69.1 kg       Palliative Assessment/Data:PPS 20-30%      Patient Active Problem List   Diagnosis Date Noted   Acute metabolic encephalopathy 24/58/0998   Myoclonic jerking 02/22/2021   Steroid-induced hyperglycemia 02/22/2021   Metastatic cancer ([place]) 02/21/2021   Anemia 02/21/2021   Lower extremity weakness 02/21/2021   Difficulty urinating 02/21/2021   DNR (do not resuscitate) 11/10/2020   Esophageal stricture 09/19/2020   Drug-induced neutropenia ([place]) 08/10/2020   Cancer related pain 03/22/2020   Bone metastases ([place]) 03/22/2020   Iron deficiency anemia 08/12/2019   Upper GI bleed 08/11/2019   Malignant neoplasm of esophagus (HCC)    Hematemesis with nausea 08/10/2019   Syncope, vasovagal 08/10/2019   Acute blood loss anemia 08/10/2019   Alcohol abuse     Palliative Care Assessment & Plan   Patient Profile: 46 y.o. male  with past medical history of alcohol abuse and metastatic esophageal cancer currently on palliative chemotherapy with Dr. Burr Medico admitted on 02/21/2021 with worsening hip pain related to large sacral  metastatic lesion with extraosseous extension.  He had radiation to sacrum last February and plan is for further radiation therapy this admission.  Recommendations/Plan: Pain, cancer related: MAR reviewed and he has used 8 doses of IV dilaudid. Continue OxyContin 20 mg 3 times daily. Of note patient does not have health insurance. Once he is discharged home he most likely will not be able to afford OxyContin out of pocket. Given myoclonic jerking on admission when taking MS Contin, may need to consider transitioning to methadone for more financial feasibility. Will closely monitor. If patient discharges with hospice support may continue current regimen.   Overall, this is difficult to treat pain secondary to bony metastasis and would recommend continuation of steroids as well as plan for radiation therapy. Continue gabapentin  Prior to admission patient was taking oral dilaudid every 3hrs as needed. Will re-order dilaudid 2-4 mg every 3 hours PRN in anticipation of discharging home on effective regimen. He has received 1 doses of oral medication. Will evaluate IV usage over the next 24 hrs and make adjustments as necessary. He has received 4 doses of IV dilaudid over past 24 hours which is an improvement. Request is for IV dilaudid to be used if oral regimen is not effective in preparation of discharging home.  Anxiety/withdrawal: Continue CIWA. Family meeting on 2/3. They were clear in expressed goals to continue radiation treatment with appropriate concerns regarding transporting [name] daily for treatments. They are in agreement to continue treatments throughout next week if possible with a goal of discharging home the weekend. At that time family will have solicited additional family support with plans to try and get patient here daily for final 5 treatments. If patient does not tolerate or unable to return daily family and patient would be at peace with not completing final treatments and focusing on his  comfort at that time.  Note Dr. Burr Medico has indicated consideration for home hospice when he completes radiation therapy.  We will continue to follow along to progress this conversation in conjunction with her over the next several days.   Code Status:    Code Status Orders  (From admission, onward)           Start     Ordered   02/21/21 1802  Do not attempt resuscitation (DNR)  Continuous       Question Answer Comment  In the event of cardiac or respiratory ARREST Do not call a code blue   In the event of cardiac or respiratory ARREST Do not perform Intubation, CPR, defibrillation or ACLS   In the event of cardiac or respiratory ARREST Use medication by any route, position, wound care, and other measures to relive pain and suffering. May use oxygen, suction and manual treatment of airway obstruction as needed for comfort.      02/21/21 1801           Code Status History     Date Active Date Inactive Code Status Order ID Comments User Context   12/28/2020 1514 02/21/2021 1238 DNR [id number]  Jimmy Footman, NP Outpatient   08/10/2019 1546 08/12/2019 1945 Full Code [id number]  Darliss Cheney, MD ED      Advance Directive Documentation    Flowsheet Row Most Recent Value  Type of Advance Directive Healthcare Power of Attorney, Living will  Pre-existing out of facility DNR order (yellow form or pink MOST form) --  "MOST" Form in Place? --       Prognosis:  POOR  Discharge Planning: To Be Determined  Care plan was discussed with patient and his family.   Thank you for allowing the Palliative Medicine Team to assist in the care of this patient.  Time Total: 20 min.   Visit consisted of counseling and education dealing with the complex and emotionally intense issues of symptom management and palliative care in the setting of serious and potentially life-threatening illness.Greater than 50%  of this time was spent counseling and coordinating care related to the  above assessment and plan.  Alda Lea, AGPCNP-BC  Palliative Medicine Team [phone]

## 2021-03-04 NOTE — Progress Notes (Signed)
PROGRESS NOTE    Jimmy Hawkins  XLK:[id number] DOB: December 02, 1975 DOA: 02/21/2021 PCP: [name], MD       Brief Narrative:  46 yo with hx etoh abuse and metastatic esophageal cancer currently on palliative chemotherapy He had significant pain,.  MRI showed a large sacral met.  He's been started on steroids and radiation therapy.  Palliative care working on pain regimen.  Per Dr. Burr Medico, likely to transition to home hospice after completion of radiation therapy.  Discharge is pending improvement in pain control.   Subjective:  In bed, laying flat   Assessment & Plan:   Principal Problem:   Metastatic cancer ([place]) Active Problems:   Alcohol abuse   Malignant neoplasm of esophagus ([place])   Cancer related pain   Bone metastases (HCC)   Anemia   Lower extremity weakness   Difficulty urinating   Acute metabolic encephalopathy   Myoclonic jerking   Steroid-induced hyperglycemia   Metastatic esophageal cancer with sacral mets -Appreciate medical oncology , radiation oncology and palliative care -palliative radiation to sacrum for pain control, on iv steroids, PPI and carafate  -Appreciate palliative care for pain management -Plan for home with home hospice after another week of radiation therapy and pain is better controlled perhaps 2/11-- per oncology 2/2: Patient is tolerating radiation well, his last dose of radiation is scheduled for 2/17. Due to his leg weakness, it will be difficult to do RT as outpt. I will talk to rad/onc Dr. Lisbeth Renshaw to see if he can shorten his radiation course.   Difficulty urination -Flomax, prevent constipation Denies bladder fullness, able to get up from bed to use the bathroom with the help of  family  Constipation Increase stool softener, add suppository    FTT , poor prognosis  -palliative care eval 2/3      DVT prophylaxis: enoxaparin (LOVENOX) injection 40 mg Start: 02/25/21 2200   Code Status: DNR Family Communication:  at  bedside Disposition:   Status is: Inpatient  Dispo: The patient is from: Home              Anticipated d/c is to: Home with hospice  vs HH once pain is better controlled-- target date of 2/11                Consultants:  Medical oncology Radiation oncology Palliative care  Procedures:  XRT to sacral bone mets     Objective: Vitals:   03/03/21 0553 03/03/21 1314 03/03/21 2046 03/04/21 0400  BP: 114/77 101/67 104/74 94/64  Pulse: 76 78 79 81  Resp: '18 16 15 18  ' Temp: 97.6 F (36.4 C) 98.4 F (36.9 C) 98.3 F (36.8 C) 99.2 F (37.3 C)  TempSrc: Oral Oral Oral Oral  SpO2: 100% 99% 91% 99%  Weight:      Height:        Intake/Output Summary (Last 24 hours) at 03/04/2021 3664 Last data filed at 03/04/2021 4034 Gross per 24 hour  Intake 490 ml  Output 750 ml  Net -260 ml   Filed Weights   02/21/21 1301  Weight: 69.1 kg    Examination:   General: Appearance:    Older than stated age male in no acute distress     Lungs:     Clear to auscultation bilaterally, respirations unlabored  Heart:    Normal heart rate. Normal rhythm. No murmurs, rubs, or gallops.    MS:   All extremities are intact.    Neurologic:   Awake, alert  Data Reviewed: I have personally reviewed following labs and imaging studies  CBC: No results for input(s): WBC, NEUTROABS, HGB, HCT, MCV, PLT in the last 168 hours.   Basic Metabolic Panel: No results for input(s): NA, K, CL, CO2, GLUCOSE, BUN, CREATININE, CALCIUM, MG, PHOS in the last 168 hours.   GFR: Estimated Creatinine Clearance: 116.4 mL/min (A) (by C-G formula based on SCr of 0.39 mg/dL (L)).  Liver Function Tests: No results for input(s): AST, ALT, ALKPHOS, BILITOT, PROT, ALBUMIN in the last 168 hours.   CBG: No results for input(s): GLUCAP in the last 168 hours.    No results found for this or any previous visit (from the past 240 hour(s)).        Radiology Studies: No results found.      Scheduled  Meds:  bisacodyl  10 mg Rectal Daily   Chlorhexidine Gluconate Cloth  6 each Topical Daily   dexamethasone (DECADRON) injection  4 mg Intravenous Q6H   enoxaparin (LOVENOX) injection  40 mg Subcutaneous Y69S   folic acid  1 mg Oral Daily   gabapentin  300 mg Oral TID   megestrol  400 mg Oral Daily   multivitamin with minerals  1 tablet Oral Daily   oxyCODONE  20 mg Oral Q8H   pantoprazole  40 mg Oral BID AC   polyethylene glycol  17 g Oral BID   senna-docusate  1 tablet Oral BID   sodium chloride flush  10-40 mL Intracatheter Q12H   sucralfate  1 g Oral QID   tamsulosin  0.4 mg Oral QPC breakfast   thiamine  100 mg Oral Daily   traZODone  50 mg Oral QHS   Continuous Infusions:     LOS: 10 days      Geradine Girt, DO [facility]  Available via Epic secure chat 7am-7pm for nonurgent issues Please page for urgent issues To page the attending provider between 7A-7P or the covering provider during after hours 7P-7A, please log into the web site [email or web address] and access using universal [place] password for that web site. If you do not have the password, please call the hospital operator.    03/04/2021, 9:58 AM

## 2021-03-05 ENCOUNTER — Ambulatory Visit
Admit: 2021-03-05 | Discharge: 2021-03-05 | Disposition: A | Discharge status: Home / Self Care | Payer: Self-pay | Attending: Radiation Oncology | Admitting: Radiation Oncology

## 2021-03-05 DIAGNOSIS — E876 Hypokalemia: Secondary | ICD-10-CM

## 2021-03-05 LAB — CBC
HCT: 32.7 % — ABNORMAL LOW (ref 39.0–52.0)
Hemoglobin: 10.9 g/dL — ABNORMAL LOW (ref 13.0–17.0)
MCH: 32.5 pg (ref 26.0–34.0)
MCHC: 33.3 g/dL (ref 30.0–36.0)
MCV: 97.6 fL (ref 80.0–100.0)
Platelets: 136 10*3/uL — ABNORMAL LOW (ref 150–400)
RBC: 3.35 MIL/uL — ABNORMAL LOW (ref 4.22–5.81)
RDW: 15.1 % (ref 11.5–15.5)
WBC: 2.8 10*3/uL — ABNORMAL LOW (ref 4.0–10.5)
nRBC: 0 % (ref 0.0–0.2)

## 2021-03-05 LAB — BASIC METABOLIC PANEL
Anion gap: 7 (ref 5–15)
BUN: 15 mg/dL (ref 6–20)
CO2: 26 mmol/L (ref 22–32)
Calcium: 8.1 mg/dL — ABNORMAL LOW (ref 8.9–10.3)
Chloride: 98 mmol/L (ref 98–111)
Creatinine, Ser: 0.44 mg/dL — ABNORMAL LOW (ref 0.61–1.24)
GFR, Estimated: >60 mL/min (ref >60)
Glucose, Bld: 138 mg/dL — ABNORMAL HIGH (ref 70–99)
Potassium: 3.3 mmol/L — ABNORMAL LOW (ref 3.5–5.1)
Sodium: 131 mmol/L — ABNORMAL LOW (ref 135–145)

## 2021-03-05 MED ORDER — POTASSIUM CHLORIDE CRYS ER 20 MEQ PO TBCR
40.0000 meq | EXTENDED_RELEASE_TABLET | Freq: Once | ORAL | Status: AC
  Administered 2021-03-05: 40 meq via ORAL
  Filled 2021-03-05: qty 2

## 2021-03-05 NOTE — TOC Progression Note (Addendum)
Transition of Care [facility]) - Progression Note    Patient Details  Name: Jimmy Hawkins MRN: [id number] Date of Birth: 07/29/1975  [facility] CM/SW Contact  Ross Ludwig, [place] Phone Number: 03/05/2021, 5:11 PM  Clinical Narrative:     CSW continuing to follow patient's progress throughout discharge planning.  CSW was informed patient may want home with hospice after completing radiation.  CSW following, if patient needs home with hospice please put in a [facility] consult.        Expected Discharge Plan and [place] with hospice.                                             Social Determinants of Health (SDOH) Interventions    Readmission Risk Interventions No flowsheet data found.

## 2021-03-05 NOTE — Assessment & Plan Note (Addendum)
Replaced. °

## 2021-03-05 NOTE — Progress Notes (Signed)
Progress Note   Patient: Jimmy Hawkins [id number]:[id number] DOB: [birth year] DOA: 02/21/2021     11 DOS: the patient was seen and examined on 03/05/2021   Brief hospital course: 46 yo with hx etoh abuse and metastatic esophageal cancer currently on palliative chemotherapy with Dr. Burr Medico.  He called Dr. Burr Medico on the day of admission with worsening hip pain and he was directed to the ED.  MRI showed a large sacral met.  He's been started on steroids and radiation therapy.  Palliative care working on pain regimen.  Per Dr. Burr Medico, likely to transition to home hospice after completion of radiation therapy.  Discharge is pending improvement in pain control and completion orf radiation treatment.   Assessment and Plan: Malignant neoplasm of esophagus (HCC) Followed by Dr. Burr Medico Likely to transition to [place] after completion of Radiation Therapy per Dr. Burr Medico note from 4/88  Acute metabolic encephalopathy Suspect related to pain meds Ammonia wnl, b12 elevated, folate wnl, VBG without hypercarbia EEG with moderate diffuse encephalopathy, nonspecific etiology - non epileptic jerking episodes - no seizures or epileptiform discharges Jerking has improved Received High dose  thiamine given hx etoh abuse Resolved.   Bone metastases ([place])- (present on admission) -CT with large sacral metastatic lesion 8.6x6.1 with extraosseous extension -PET 1/17 with increase in size of metastatic lesion in L hemisacrum -MRI sacrum with large met with central necrosis, extensive involvement of S1, S2, and S3 segments with lesser involvement of upper S4.  Lesion is more extensive to L of midline, but involves all sacral foramina.  Tumor fills sacral spinal canal and extends up to L5-S1 disc level.  Extends into scaral hiatus on the L.  Pathologic fx at S2.  Grossing of L SI joint with involvement of iliac. Started on Radiation Therapy t 02/26/2021, last dose of Radiation to be complete 2/17. Per Dr Burr Medico plan for home  with hospice after completion of radiatio.  Hypokalemia Replete orally.   Steroid-induced hyperglycemia a1c 4.8 follow on daily labs  Myoclonic jerking Related to pain meds Improved with transition to oxycodone  Difficulty urinating Likely related to metastatic disease Started on Flomax.    Lower extremity weakness Likely related to mets.  Started  dexamethasone Continue with radiation Tx.    Anemia Chronic, Related to malignancy.  Normal B12, folate AOCD with iron def Hb stable.   Cancer related pain Transitioned to oxycontin 20 mg TID per palliative care Continue IV dilaudid prn Oral Dilaudid prn Steroids Palliative care for assistance with pain - appreciate assistance   Alcohol abuse- (present on admission) Completed CIWA High dose thiamine with encephalopathy No evidence of withdrawal.         Subjective: he report pain, IV dilaudid helps. I explain to him he has oral and IV meds PRN   Physical Exam: Vitals:   03/04/21 1309 03/04/21 2034 03/05/21 0503 03/05/21 1341  BP: (!) 99/59 107/71 108/68 98/73  Pulse: 85 82 78 79  Resp: '14 18 16 15  ' Temp: 98.2 F (36.8 C) 98.3 F (36.8 C) 98.4 F (36.9 C) 98.2 F (36.8 C)  TempSrc: Oral Oral Oral Oral  SpO2: 99% 99% 100% 100%  Weight:      Height:       General; Alert.  CVS; S 1, S 2 RRR Lungs; CTA Abdomen; soft, NT.  Extremity; no edema. BL weakness.   Data Reviewed:  CBC and Bmet  reviewed  Family Communication: niece at bedside.   Disposition: Status is: Inpatient Remains inpatient  appropriate because: requiring radiation treatment, unable to complete out patient.           Planned Discharge Destination: Home     Time spent: 45 minutes  Author: Elmarie Shiley, MD 03/05/2021 4:41 PM  For on call review [email or web address].

## 2021-03-06 ENCOUNTER — Ambulatory Visit
Admit: 2021-03-06 | Discharge: 2021-03-06 | Disposition: A | Discharge status: Home / Self Care | Payer: Self-pay | Attending: Radiation Oncology | Admitting: Radiation Oncology

## 2021-03-06 DIAGNOSIS — R29898 Other symptoms and signs involving the musculoskeletal system: Secondary | ICD-10-CM

## 2021-03-06 LAB — BASIC METABOLIC PANEL
Anion gap: 7 (ref 5–15)
BUN: 13 mg/dL (ref 6–20)
CO2: 26 mmol/L (ref 22–32)
Calcium: 8.1 mg/dL — ABNORMAL LOW (ref 8.9–10.3)
Chloride: 97 mmol/L — ABNORMAL LOW (ref 98–111)
Creatinine, Ser: 0.43 mg/dL — ABNORMAL LOW (ref 0.61–1.24)
GFR, Estimated: >60 mL/min (ref >60)
Glucose, Bld: 125 mg/dL — ABNORMAL HIGH (ref 70–99)
Potassium: 3.7 mmol/L (ref 3.5–5.1)
Sodium: 130 mmol/L — ABNORMAL LOW (ref 135–145)

## 2021-03-06 NOTE — Progress Notes (Signed)
Daily Progress Note   Patient Name: Jimmy Hawkins       Date: 03/06/2021 DOB: 1975-08-16  Age: 46 y.o. MRN#: [id number] Attending Physician: Elmarie Shiley, MD Primary Care Physician: [name], MD Admit Date: 02/21/2021  Reason for Consultation/Follow-up: Establishing goals of care and Pain control  Subjective: Chart Reviewed. Patient assessed at the bedside.   Patient resting comfortably. Family at the bedside. He is hopeful to return home later this week. We have discussed hospice support with understanding he will still finish out his last week of treatment on next week if able to transport. I will plan to follow-up with wife and sister tomorrow for confirmation of plan.   Pain is well controlled on current regimen. No changes.   All questions answered and support provided.   Length of Stay: 12  Current Medications: Scheduled Meds:   Chlorhexidine Gluconate Cloth  6 each Topical Daily   dexamethasone (DECADRON) injection  4 mg Intravenous Q6H   enoxaparin (LOVENOX) injection  40 mg Subcutaneous D53G   folic acid  1 mg Oral Daily   gabapentin  300 mg Oral TID   megestrol  400 mg Oral Daily   multivitamin with minerals  1 tablet Oral Daily   oxyCODONE  20 mg Oral Q8H   pantoprazole  40 mg Oral BID AC   polyethylene glycol  17 g Oral BID   senna-docusate  1 tablet Oral BID   sodium chloride flush  10-40 mL Intracatheter Q12H   sucralfate  1 g Oral QID   tamsulosin  0.4 mg Oral QPC breakfast   thiamine  100 mg Oral Daily   traZODone  50 mg Oral QHS    Continuous Infusions:    PRN Meds: diphenhydrAMINE **AND** acetaminophen, [COMPLETED] acetaminophen **FOLLOWED BY** acetaminophen, HYDROmorphone (DILAUDID) injection, HYDROmorphone, polyethylene glycol,  prochlorperazine  Physical Exam         General: NAD, awake and alert Heart: RRR Lungs: diminished  Abdomen: Soft, nontender, nondistended, positive bowel sounds.  Ext: No significant edema Skin: Warm and dry Neuro: AAO x4 with interpretation   Vital Signs: BP 99/69 (BP Location: Left Arm)    Pulse 74    Temp 98 F (36.7 C) (Oral)    Resp 18    Ht 6' (1.829 m)    Wt 69.1 kg  SpO2 100%    BMI 20.67 kg/m  SpO2: SpO2: 100 % O2 Device: O2 Device: Room Air O2 Flow Rate:    Intake/output summary:  Intake/Output Summary (Last 24 hours) at 03/06/2021 1659 Last data filed at 03/06/2021 1100 Gross per 24 hour  Intake 240 ml  Output 2325 ml  Net -2085 ml    LBM: Last BM Date: 03/02/21 Baseline Weight: Weight: 69.1 kg Most recent weight: Weight: 69.1 kg       Palliative Assessment/Data:PPS 20-30%      Patient Active Problem List   Diagnosis Date Noted   Hypokalemia 14/48/1856   Acute metabolic encephalopathy 31/49/7026   Myoclonic jerking 02/22/2021   Steroid-induced hyperglycemia 02/22/2021   Metastatic cancer ([place]) 02/21/2021   Anemia 02/21/2021   Lower extremity weakness 02/21/2021   Difficulty urinating 02/21/2021   DNR (do not resuscitate) 11/10/2020   Esophageal stricture 09/19/2020   Drug-induced neutropenia ([place]) 08/10/2020   Cancer related pain 03/22/2020   Bone metastases ([place]) 03/22/2020   Iron deficiency anemia 08/12/2019   Upper GI bleed 08/11/2019   Malignant neoplasm of esophagus (HCC)    Hematemesis with nausea 08/10/2019   Syncope, vasovagal 08/10/2019   Acute blood loss anemia 08/10/2019   Alcohol abuse     Palliative Care Assessment & Plan   Patient Profile: 46 y.o. male  with past medical history of alcohol abuse and metastatic esophageal cancer currently on palliative chemotherapy with Dr. Burr Medico admitted on 02/21/2021 with worsening hip pain related to large sacral metastatic lesion with extraosseous extension.  He had radiation to sacrum last  February and plan is for further radiation therapy this admission.  Recommendations/Plan: Pain, cancer related: MAR reviewed and he has used 8 doses of IV dilaudid. Continue OxyContin 20 mg 3 times daily. Of note patient does not have health insurance. Once he is discharged home he most likely will not be able to afford OxyContin out of pocket. Given myoclonic jerking on admission when taking MS Contin, may need to consider transitioning to methadone for more financial feasibility. Will closely monitor. If patient discharges with hospice support may continue current regimen.   Overall, this is difficult to treat pain secondary to bony metastasis and would recommend continuation of steroids as well as plan for radiation therapy. Continue gabapentin  Prior to admission patient was taking oral dilaudid every 3hrs as needed. Will re-order dilaudid 2-4 mg every 3 hours PRN in anticipation of discharging home on effective regimen. He has received 1 doses of oral medication. Will evaluate IV usage over the next 24 hrs and make adjustments as necessary. He has received 4 doses of IV dilaudid over past 24 hours which is an improvement. Request is for IV dilaudid to be used if oral regimen is not effective in preparation of discharging home.  Anxiety/withdrawal: Continue CIWA. Family meeting on 2/3. They were clear in expressed goals to continue radiation treatment with appropriate concerns regarding transporting Jimmy Hawkins daily for treatments. They are in agreement to continue treatments throughout next week if possible with a goal of discharging home the weekend. At that time family will have solicited additional family support with plans to try and get patient here daily for final 5 treatments. If patient does not tolerate or unable to return daily family and patient would be at peace with not completing final treatments and focusing on his comfort at that time.    Note Dr. Burr Medico has indicated consideration for home  hospice when he completes radiation therapy.  We will  continue to follow along to progress this conversation in conjunction with her over the next several days.   Code Status:    Code Status Orders  (From admission, onward)           Start     Ordered   02/21/21 1802  Do not attempt resuscitation (DNR)  Continuous       Question Answer Comment  In the event of cardiac or respiratory ARREST Do not call a code blue   In the event of cardiac or respiratory ARREST Do not perform Intubation, CPR, defibrillation or ACLS   In the event of cardiac or respiratory ARREST Use medication by any route, position, wound care, and other measures to relive pain and suffering. May use oxygen, suction and manual treatment of airway obstruction as needed for comfort.      02/21/21 1801           Code Status History     Date Active Date Inactive Code Status Order ID Comments User Context   12/28/2020 1514 02/21/2021 1238 DNR [id number]  Jimmy Footman, NP Outpatient   08/10/2019 1546 08/12/2019 1945 Full Code [id number]  Darliss Cheney, MD ED      Advance Directive Documentation    Flowsheet Row Most Recent Value  Type of Advance Directive Healthcare Power of Attorney, Living will  Pre-existing out of facility DNR order (yellow form or pink MOST form) --  "MOST" Form in Place? --       Prognosis:  POOR  Discharge Planning: To Be Determined  Care plan was discussed with patient and his family.   Thank you for allowing the Palliative Medicine Team to assist in the care of this patient.  Time Total: 15 min.   Visit consisted of counseling and education dealing with the complex and emotionally intense issues of symptom management and palliative care in the setting of serious and potentially life-threatening illness.Greater than 50%  of this time was spent counseling and coordinating care related to the above assessment and plan.  Alda Lea, AGPCNP-BC   Palliative Medicine Team [phone]

## 2021-03-06 NOTE — Progress Notes (Signed)
Labs drawn per MD order and per protocol. Labs tubed to lab. Neomia Dear, RN

## 2021-03-06 NOTE — Progress Notes (Signed)
Progress Note   Patient: Jimmy Hawkins ZJI:[id number] DOB: 1975/11/02 DOA: 02/21/2021     12 DOS: the patient was seen and examined on 03/06/2021   Brief hospital course: 46 yo with hx etoh abuse and metastatic esophageal cancer was on palliative chemotherapy with Dr. Burr Medico.  He called Dr. Burr Medico on the day of admission with worsening hip pain and he was directed to the ED.  MRI showed a large sacral met.  He's been started on steroids and radiation therapy.  Palliative care working on pain regimen.  Per Dr. Burr Medico, likely to transition to home hospice after completion of radiation therapy.  Discharge is pending improvement in pain control and completion orf radiation treatment.   Assessment and Plan: Malignant neoplasm of esophagus (HCC) Followed by Dr. Burr Medico Likely to transition to [place] after completion of Radiation Therapy per Dr. Burr Medico note from 1/75  Acute metabolic encephalopathy Suspect related to pain meds Ammonia wnl, b12 elevated, folate wnl, VBG without hypercarbia EEG with moderate diffuse encephalopathy, nonspecific etiology - non epileptic jerking episodes - no seizures or epileptiform discharges Jerking has resolved.  Received High dose  thiamine given hx etoh abuse Resolved.   Bone metastases ([place])- (present on admission) -CT with large sacral metastatic lesion 8.6x6.1 with extraosseous extension -PET 1/17 with increase in size of metastatic lesion in L hemisacrum -MRI sacrum with large met with central necrosis, extensive involvement of S1, S2, and S3 segments with lesser involvement of upper S4.  Lesion is more extensive to L of midline, but involves all sacral foramina.  Tumor fills sacral spinal canal and extends up to L5-S1 disc level.  Extends into scaral hiatus on the L.  Pathologic fx at S2.  Grossing of L SI joint with involvement of iliac. Started on Radiation Therapy t 02/26/2021, last dose of Radiation to be complete 2/17. Per Dr Burr Medico plan for home with  hospice after completion of radiatio.  Hypokalemia Replaced.   Steroid-induced hyperglycemia a1c 4.8 CBG stable 120 range  Myoclonic jerking Related to pain meds Improved with transition to oxycodone  Difficulty urinating Likely related to metastatic disease Continue with  Flomax.  He has been able to urinate.   Lower extremity weakness Likely related to mets.  Started  dexamethasone Continue with radiation Tx.  Notice some improvement.    Anemia Chronic, Related to malignancy.  Normal B12, folate AOCD with iron def Hb stable.   Cancer related pain Transitioned to oxycontin 20 mg TID per palliative care Continue IV dilaudid prn Oral Dilaudid prn Steroids Palliative care for assistance with pain - appreciate assistance   Alcohol abuse- (present on admission) Completed CIWA Received high dose thiamine.  No evidence of withdrawal.         Subjective: he report pain, he had to stand up due to severe pain buttock. His pain is severe with movement or when he tries to stand or walk. Unable to ambulate due to severe pain   Physical Exam: Vitals:   03/05/21 2050 03/06/21 0443 03/06/21 0810 03/06/21 1504  BP: 111/66 103/66 104/70 99/69  Pulse: 83 78 80 74  Resp: '18 16 18 18  ' Temp: 98.4 F (36.9 C) 98.4 F (36.9 C) 98.6 F (37 C) 98 F (36.7 C)  TempSrc: Oral Oral Oral Oral  SpO2: 100% 99% 96% 100%  Weight:      Height:       General; alert, follows command CVS; S 1 , S 2 RRR Lungs CTA Extremities; able to move  extremities passively.   Data Reviewed:  Bmet reviewed today   Family Communication: Niece at bedside.   Disposition: Status is: Inpatient Remains inpatient appropriate because: for radiation treatment, pain controlled. Unable to [place] out patient radiation           Planned Discharge Destination: Home     Time spent: 45 minutes  Author: Elmarie Shiley, MD 03/06/2021 3:17 PM  For on call review [email or web address].

## 2021-03-07 ENCOUNTER — Ambulatory Visit
Admit: 2021-03-07 | Discharge: 2021-03-07 | Disposition: A | Discharge status: Home / Self Care | Payer: Self-pay | Attending: Radiation Oncology | Admitting: Radiation Oncology

## 2021-03-07 MED ORDER — HYDROMORPHONE HCL 1 MG/ML IJ SOLN
1.0000 mg | Freq: Four times a day (QID) | INTRAMUSCULAR | Status: DC | PRN
  Administered 2021-03-08 – 2021-03-10 (×3): 1 mg via INTRAVENOUS
  Filled 2021-03-07 (×3): qty 1

## 2021-03-07 MED ORDER — HYDROMORPHONE HCL 4 MG PO TABS
4.0000 mg | ORAL_TABLET | ORAL | Status: DC | PRN
  Administered 2021-03-08 – 2021-03-09 (×3): 6 mg via ORAL
  Administered 2021-03-09 (×3): 4 mg via ORAL
  Filled 2021-03-07 (×2): qty 1
  Filled 2021-03-07 (×3): qty 2
  Filled 2021-03-07: qty 1

## 2021-03-07 MED ORDER — MORPHINE SULFATE (CONCENTRATE) 10 MG/0.5ML PO SOLN
10.0000 mg | ORAL | Status: DC | PRN
  Administered 2021-03-08 – 2021-03-09 (×3): 20 mg via ORAL
  Filled 2021-03-07 (×3): qty 1

## 2021-03-07 MED ORDER — OXYCODONE HCL ER 15 MG PO T12A
30.0000 mg | EXTENDED_RELEASE_TABLET | Freq: Three times a day (TID) | ORAL | Status: DC
Start: 2021-03-07 — End: 2021-03-10
  Administered 2021-03-07 – 2021-03-10 (×8): 30 mg via ORAL
  Filled 2021-03-07 (×8): qty 2

## 2021-03-07 NOTE — Progress Notes (Signed)
Progress Note   Patient: Jimmy Hawkins RDE:[id number] DOB: May 20, 1975 DOA: 02/21/2021     13 DOS: the patient was seen and examined on 03/07/2021   Brief hospital course: 46 yo with hx etoh abuse and metastatic esophageal cancer was on palliative chemotherapy with Dr. Burr Hawkins.  He called Dr. Burr Hawkins on the day of admission with worsening hip pain and he was directed to the ED.  MRI showed a large sacral met.  He's been started on steroids and radiation therapy.  Palliative care working on pain regimen.  Per Dr. Burr Hawkins, likely to transition to home hospice after completion of radiation therapy.  Discharge is pending improvement in pain control and completion of radiation treatment.   Assessment and Plan: Malignant neoplasm of esophagus (HCC) Followed by Dr. Burr Hawkins Likely to transition to [place] after completion of Radiation Therapy per Dr. Burr Hawkins note from 6/31  Acute metabolic encephalopathy Suspect related to pain meds Ammonia wnl, b12 elevated, folate wnl, VBG without hypercarbia EEG with moderate diffuse encephalopathy, nonspecific etiology - non epileptic jerking episodes - no seizures or epileptiform discharges. Received High dose  thiamine given hx etoh abuse Resolved.   Bone metastases ([place])- (present on admission) -CT with large sacral metastatic lesion 8.6x6.1 with extraosseous extension -PET 1/17 with increase in size of metastatic lesion in L hemisacrum -MRI sacrum with large met with central necrosis, extensive involvement of S1, S2, and S3 segments with lesser involvement of upper S4.  Lesion is more extensive to L of midline, but involves all sacral foramina.  Tumor fills sacral spinal canal and extends up to L5-S1 disc level.  Extends into scaral hiatus on the L.  Pathologic fx at S2.  Grossing of L SI joint with involvement of iliac. Started on Radiation Therapy t 02/26/2021, last dose of Radiation to be complete 2/17. Per Dr Jimmy Hawkins plan for home with hospice after completion  of radiatio.  Hypokalemia Replaced.   Steroid-induced hyperglycemia a1c 4.8 CBG stable 120 range  Myoclonic jerking Related to pain meds Improved with transition to oxycodone  Difficulty urinating Likely related to metastatic disease Continue with  Flomax.  He has been able to urinate.   Lower extremity weakness Likely related to mets.  Started  dexamethasone Continue with radiation Tx.  Notice some improvement.    Anemia Chronic, Related to malignancy.  Normal B12, folate AOCD with iron def Hb stable.   Cancer related pain Transitioned to oxycontin 20 mg TID per palliative care Continue IV dilaudid prn Oral Dilaudid prn Steroids Palliative care for assistance with pain - appreciate assistance Pain better controlled.   Alcohol abuse- (present on admission) Completed CIWA Received high dose thiamine.  No evidence of withdrawal.         Subjective: he is alert, report pain is controlled.  He will discussed with sister to see if he will be able to complete treatment out patient.   Physical Exam: Vitals:   03/06/21 0810 03/06/21 1504 03/06/21 2155 03/07/21 0432  BP: 104/70 99/69 110/66 97/69  Pulse: 80 74 85 72  Resp: '18 18 18 20  ' Temp: 98.6 F (37 C) 98 F (36.7 C) (!) 97.5 F (36.4 C) 98 F (36.7 C)  TempSrc: Oral Oral Oral Oral  SpO2: 96% 100% 99% 97%  Weight:      Height:       General; alert CVS; S 1, S 2 RRR Lungs; CTA  Data Reviewed:  There are no new results to review at this time.  Family  Communication: care discussed with patient, nieces at bedside.   Disposition: Status is: Inpatient Remains inpatient appropriate because: radiation treatment.           Planned Discharge Destination: Home     Time spent: 45 minutes  Author: Elmarie Shiley, MD 03/07/2021 2:10 PM  For on call review [email or web address].

## 2021-03-07 NOTE — Progress Notes (Signed)
Jimmy Hawkins   DOB:1975-03-30   ZY#:[id number]   CWU#:[id number]  Oncology follow up   Subjective: Patient is doing better, he is able to get out of bed by himself, and walks in the hallways with a walker.  He still spends most time in bed.  Pain is controlled most time, but he is concerned that he still has intermittent severe pain which requires iv pain meds. He received iv Dilaudid 1 mg 3 times today.    Objective:  Vitals:   03/07/21 0432 03/07/21 1533  BP: 97/69 97/69  Pulse: 72 93  Resp: 20 18  Temp: 98 F (36.7 C) 97.7 F (36.5 C)  SpO2: 97% 98%    Body mass index is 20.67 kg/m.  Intake/Output Summary (Last 24 hours) at 03/07/2021 1656 Last data filed at 03/07/2021 1406 Gross per 24 hour  Intake 120 ml  Output 1000 ml  Net -880 ml     Sclerae unicteric  No leg edema, he is able to sit up and get out of bed independently    CBG (last 3)  No results for input(s): GLUCAP in the last 72 hours.    Labs:  Urine Studies No results for input(s): UHGB, CRYS in the last 72 hours.  Invalid input(s): UACOL, UAPR, USPG, UPH, UTP, UGL, UKET, UBIL, UNIT, UROB, ULEU, UEPI, UWBC, URBC, UBAC, CAST, UCOM, BILUA  Basic Metabolic Panel: Recent Labs  Lab 03/05/21 0500 03/06/21 0455  NA 131* 130*  K 3.3* 3.7  CL 98 97*  CO2 26 26  GLUCOSE 138* 125*  BUN 15 13  CREATININE 0.44* 0.43*  CALCIUM 8.1* 8.1*   GFR Estimated Creatinine Clearance: 116.4 mL/min (A) (by C-G formula based on SCr of 0.43 mg/dL (L)). Liver Function Tests: No results for input(s): AST, ALT, ALKPHOS, BILITOT, PROT, ALBUMIN in the last 168 hours.  No results for input(s): LIPASE, AMYLASE in the last 168 hours. No results for input(s): AMMONIA in the last 168 hours.  Coagulation profile No results for input(s): INR, PROTIME in the last 168 hours.  CBC: Recent Labs  Lab 03/05/21 0500  WBC 2.8*  HGB 10.9*  HCT 32.7*  MCV 97.6  PLT 136*   Cardiac Enzymes: No results for input(s):  CKTOTAL, CKMB, CKMBINDEX, TROPONINI in the last 168 hours. BNP: Invalid input(s): POCBNP CBG: No results for input(s): GLUCAP in the last 168 hours.  D-Dimer No results for input(s): DDIMER in the last 72 hours. Hgb A1c No results for input(s): HGBA1C in the last 72 hours.  Lipid Profile No results for input(s): CHOL, HDL, LDLCALC, TRIG, CHOLHDL, LDLDIRECT in the last 72 hours. Thyroid function studies No results for input(s): TSH, T4TOTAL, T3FREE, THYROIDAB in the last 72 hours.  Invalid input(s): FREET3  Anemia work up No results for input(s): VITAMINB12, FOLATE, FERRITIN, TIBC, IRON, RETICCTPCT in the last 72 hours.  Microbiology No results found for this or any previous visit (from the past 240 hour(s)).     Studies:  No results found.  Assessment: 46 y.o. 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  History of heavy alcohol user 5.  Altered mental status and jerking, possibly related to narcotics, improved  6. DNR    Plan:  -Overall doing better.  Radiation is scheduled to complete next  Friday.  He is tolerating well. -He has started ambulating, he should be able to go home this weekend, and finished radiation as outpatient next week. -Plan is home hospice after he completes RT  -Patient and his sister have 2 concerns, what is urinary incontinence, which he would like to have diaper, more than foley. The main concerns is intermittent severe pain, he is afraid oral pain medication may not be sufficient for pain control.  He may need some rescue pain meds, such as sublingual morphine for quicker relief. I spoke with palliative NP Lexine Baton today, and she will stop by to see him tomorrow and adjust his pain meds. Hope he  can be discharged home on Saturday.  [name]  03/07/2021    [name], MD 03/07/2021  4:56 PM

## 2021-03-07 NOTE — Progress Notes (Signed)
Physical Therapy Treatment Patient Details Name: Jimmy Hawkins MRN: [id number] DOB: Jun 16, 1975 Today's Date: 03/07/2021   History of Present Illness 46 yo with hx etoh abuse and metastatic esophageal cancer currently on palliative chemotherapy    PT Comments    Patient remains limited by pain in Rt hip but remains agreeable to mobilizing and completing exercises. Reviewed supine exercise and adjusted position of Rt LE to reduce hip pain. Encouraged pt to continue completing daily to maintain strength, ROM, and facilitate circulation. Will continue to progress as able.    Recommendations for follow up therapy are one component of a multi-disciplinary discharge planning process, led by the attending physician.  Recommendations may be updated based on patient status, additional functional criteria and insurance authorization.  Follow Up Recommendations  Home health PT     Assistance Recommended at Discharge Frequent or constant Supervision/Assistance  Patient can return home with the following A little help with walking and/or transfers;A little help with bathing/dressing/bathroom;Assistance with cooking/housework;Assist for transportation;Help with stairs or ramp for entrance   Equipment Recommendations  None recommended by PT    Recommendations for Other Services       Precautions / Restrictions Precautions Precautions: Fall Precaution Comments: high pain Restrictions Weight Bearing Restrictions: No     Mobility  Bed Mobility Overal bed mobility: Needs Assistance Bed Mobility: Sit to Supine, Supine to Sit     Supine to sit: Supervision Sit to supine: Supervision   General bed mobility comments: supervision for safety, pt moves quickly.    Transfers Overall transfer level: Needs assistance Equipment used: Rolling walker (2 wheels) Transfers: Sit to/from Stand Sit to Stand: Min guard           General transfer comment: guarding for safety, pt quick to  move, slight posterior lean. pt sits quickly at EOS.    Ambulation/Gait Ambulation/Gait assistance: Min guard, Min assist Gait Distance (Feet): 60 Feet Assistive device: Rolling walker (2 wheels) Gait Pattern/deviations: Step-through pattern, Decreased step length - right, Decreased step length - left, Decreased stride length, Drifts right/left, Staggering right Gait velocity: fair     General Gait Details: min assist to steady due to NBOS, Rt/posterior lean and pt is quick to pivot walker. pt's feet come close to scissoring occasionally. family assisted to translate cues for step pattern.   Stairs             Wheelchair Mobility    Modified Rankin (Stroke Patients Only)       Balance Overall balance assessment: Needs assistance Sitting-balance support: No upper extremity supported, Feet supported Sitting balance-Leahy Scale: Good   Postural control: Posterior lean, Right lateral lean Standing balance support: Bilateral upper extremity supported, During functional activity Standing balance-Leahy Scale: Fair Standing balance comment: slight posterior and Rt lean in standing and with gait                            Cognition Arousal/Alertness: Awake/alert Behavior During Therapy: WFL for tasks assessed/performed Overall Cognitive Status: Within Functional Limits for tasks assessed                                 General Comments: pleasant and agreeable to therapy. pt's family present and translating for him.        Exercises General Exercises - Lower Extremity Ankle Circles/Pumps: AROM, Both, 10 reps, Supine (ankle pumps limited with dorsiflexion,) Hip ABduction/ADduction:  AROM, Left, 10 reps, Supine Straight Leg Raises: AROM, Both, 10 reps, Supine, AAROM    General Comments        Pertinent Vitals/Pain Pain Assessment Pain Assessment: 0-10 Pain Score: 7  Pain Location: R hip Pain Descriptors / Indicators: Discomfort, Grimacing,  Aching Pain Intervention(s): Monitored during session, Limited activity within patient's tolerance, Repositioned, Patient requesting pain meds-RN notified    Home Living                          Prior Function            PT Goals (current goals can now be found in the care plan section) Acute Rehab PT Goals Patient Stated Goal: Regain strength and IND PT Goal Formulation: With patient Time For Goal Achievement: 04/05/21 Potential to Achieve Goals: Fair Progress towards PT goals: Progressing toward goals    Frequency    Min 3X/week      PT Plan Current plan remains appropriate    Co-evaluation              AM-PAC PT "6 Clicks" Mobility   Outcome Measure  Help needed turning from your back to your side while in a flat bed without using bedrails?: A Little Help needed moving from lying on your back to sitting on the side of a flat bed without using bedrails?: A Little Help needed moving to and from a bed to a chair (including a wheelchair)?: A Little Help needed standing up from a chair using your arms (e.g., wheelchair or bedside chair)?: A Little Help needed to walk in hospital room?: A Little Help needed climbing 3-5 steps with a railing? : A Lot 6 Click Score: 17    End of Session Equipment Utilized During Treatment: Gait belt Activity Tolerance: Patient tolerated treatment well;Patient limited by pain Patient left: in bed;with call bell/phone within reach;with bed alarm set;with family/visitor present Nurse Communication: Mobility status PT Visit Diagnosis: Muscle weakness (generalized) (M62.81);Unsteadiness on feet (R26.81);Difficulty in walking, not elsewhere classified (R26.2);Pain Pain - Right/Left: Right Pain - part of body: Hip     Time: 1829-9371 PT Time Calculation (min) (ACUTE ONLY): 15 min  Charges:  $Therapeutic Exercise: 8-22 mins                     Verner Mould, DPT Acute Rehabilitation Services Office [phone] Pager  [phone]    Jacques Navy 03/07/2021, 4:31 PM

## 2021-03-08 ENCOUNTER — Ambulatory Visit
Admit: 2021-03-08 | Discharge: 2021-03-08 | Disposition: A | Discharge status: Home / Self Care | Payer: Self-pay | Attending: Radiation Oncology | Admitting: Radiation Oncology

## 2021-03-08 MED ORDER — TAMSULOSIN HCL 0.4 MG PO CAPS: 0.4000 mg | ORAL_CAPSULE | Freq: Every day | ORAL | 2 refills | Status: AC

## 2021-03-08 MED ORDER — MEGESTROL ACETATE 400 MG/10ML PO SUSP: 400.0000 mg | Freq: Every day | ORAL | 0 refills | Status: AC

## 2021-03-08 MED ORDER — GABAPENTIN 300 MG PO CAPS: 300.0000 mg | ORAL_CAPSULE | Freq: Three times a day (TID) | ORAL | 0 refills | Status: AC

## 2021-03-08 MED ORDER — HYDROMORPHONE HCL 4 MG PO TABS
4.0000 mg | ORAL_TABLET | ORAL | 0 refills | Status: AC | PRN
Start: 2021-03-08 — End: ?

## 2021-03-08 MED ORDER — SUCRALFATE 1 G PO TABS: 1.0000 g | ORAL_TABLET | Freq: Four times a day (QID) | ORAL | 2 refills | Status: AC

## 2021-03-08 MED ORDER — HYDROMORPHONE HCL 1 MG/ML IJ SOLN: 1.0000 mg | Freq: Once | INTRAMUSCULAR | Status: AC

## 2021-03-08 MED ORDER — PANTOPRAZOLE SODIUM 40 MG PO TBEC: 40.0000 mg | DELAYED_RELEASE_TABLET | Freq: Two times a day (BID) | ORAL | 3 refills | Status: AC

## 2021-03-08 MED ORDER — TRAZODONE HCL 50 MG PO TABS: 50.0000 mg | ORAL_TABLET | Freq: Every day | ORAL | 0 refills | Status: AC

## 2021-03-08 MED ORDER — DEXAMETHASONE 4 MG PO TABS: 4.0000 mg | ORAL_TABLET | Freq: Three times a day (TID) | ORAL | 1 refills | Status: DC

## 2021-03-08 MED ORDER — OXYCODONE HCL ER 30 MG PO T12A
30.0000 mg | EXTENDED_RELEASE_TABLET | Freq: Three times a day (TID) | ORAL | 0 refills | Status: DC
Start: 2021-03-08 — End: 2021-03-10

## 2021-03-08 MED ORDER — MORPHINE SULFATE (CONCENTRATE) 10 MG/0.5ML PO SOLN: 10.0000 mg | ORAL | 0 refills | Status: DC | PRN

## 2021-03-08 MED ORDER — POLYETHYLENE GLYCOL 3350 17 G PO PACK: 17.0000 g | PACK | Freq: Two times a day (BID) | ORAL | 0 refills | Status: AC

## 2021-03-08 NOTE — Progress Notes (Addendum)
Manufacturing engineer [facility]) Hospital Liaison: RN note    Notified by Jimmy Footman, NP of patient/family request for [facility] services at home after discharge. Chart and patient information under reviewed by [facility] physician. Hospice eligibility confirmed.   [facility] representative spoke with patient and family to initiate education related to hospice philosophy, services and team approach to care.  Patient and family verbalized understanding of information given.  Please send signed and completed DNR form home with patient/family. Patient will need prescriptions for discharge comfort medications.    Patient is planned to have 5 additional palliative radiation treatments that will be covered by current payer source and not covered under hospice.    DME needs have been discussed, patient currently has the following equipment in the home: none.  Patient/family requests the following DME for delivery to the home: hospital bed and OBT. [facility] equipment manager has been notified and will contact DME provider to arrange delivery to the home. Home address has been verified and is correct in the chart.    Claudie Leach is the family member to contact to arrange time of delivery.     [facility] aware of the above. Please notify [facility] when patient is ready to leave the unit at discharge. (Call [phone] or [phone] after 5pm.) [facility] information and contact numbers given to family.      A Please do not hesitate to call with questions.   Thank you,   Farrel Gordon, RN, [place] Hospital Liaison   [phone]

## 2021-03-08 NOTE — Progress Notes (Signed)
Progress Note   Patient: Jimmy Hawkins MBT:[id number] DOB: 01-24-1976 DOA: 02/21/2021     14 DOS: the patient was seen and examined on 03/08/2021   Brief hospital course: 46 yo with hx etoh abuse and metastatic esophageal cancer was on palliative chemotherapy with Dr. Burr Medico.  He called Dr. Burr Medico on the day of admission with worsening hip pain and he was directed to the ED.  MRI showed a large sacral met.  He's been started on steroids and radiation therapy.  Palliative care working on pain regimen.  Per Dr. Burr Medico, likely to transition to home hospice after completion of radiation therapy.  Discharge is pending improvement in pain control and completion of radiation treatment.   Assessment and Plan: Malignant neoplasm of esophagus ([place]) Followed by Dr. Burr Medico Likely to transition to [place] after completion of Radiation  Last radiation Tx 5/36/   Acute metabolic encephalopathy Suspect related to pain meds, vs alcohol use.  Ammonia wnl, b12 elevated, folate wnl, VBG without hypercarbia EEG with moderate diffuse encephalopathy, nonspecific etiology - non epileptic jerking episodes - no seizures or epileptiform discharges. Received High dose  thiamine given hx etoh abuse Resolved.   Bone metastases ([place])- (present on admission) -CT with large sacral metastatic lesion 8.6x6.1 with extraosseous extension -PET 1/17 with increase in size of metastatic lesion in L hemisacrum -MRI sacrum with large met with central necrosis, extensive involvement of S1, S2, and S3 segments with lesser involvement of upper S4.  Lesion is more extensive to L of midline, but involves all sacral foramina.  Tumor fills sacral spinal canal and extends up to L5-S1 disc level.  Extends into scaral hiatus on the L.  Pathologic fx at S2.  Grossing of L SI joint with involvement of iliac. Started on Radiation Therapy t 02/26/2021, last dose of Radiation to be complete 2/17. Per Dr Burr Medico plan for home with hospice this  weekend,   Hypokalemia Replaced.   Steroid-induced hyperglycemia a1c 4.8 CBG stable 120 range  Myoclonic jerking Related to pain meds Improved with transition to oxycodone  Difficulty urinating Likely related to metastatic disease Continue with  Flomax.  He has been able to urinate.   Lower extremity weakness Likely related to mets.  Started  dexamethasone Continue with radiation Tx.  Notice some improvement.    Anemia Chronic, Related to malignancy.  Normal B12, folate. AOCD with iron deficiency.  Hb stable.   Cancer related pain Transitioned to oxycontin 20 mg TID per palliative care Continue IV dilaudid prn Oral Dilaudid prn Steroids Palliative care for assistance with pain - appreciate assistance Pain better controlled.  Palliative added sublingual morphine for rescue pain.   Alcohol abuse- (present on admission) Completed CIWA. Received high dose thiamine.  No evidence of withdrawal.         Subjective: he report pain is controlled. He was able to ambulate   Physical Exam: Vitals:   03/07/21 0432 03/07/21 1533 03/07/21 2048 03/08/21 0604  BP: 97/69 97/69 100/70 99/74  Pulse: 72 93 93 77  Resp: '20 18 17 17  ' Temp: 98 F (36.7 C) 97.7 F (36.5 C) 98.5 F (36.9 C) 98.5 F (36.9 C)  TempSrc: Oral Oral Oral Oral  SpO2: 97% 98% 97% 99%  Weight:      Height:       General; NAD. Lung; CTA Abdomen;; soft, NT  Data Reviewed:  No data   Family Communication: sister and wife at bedside.   Disposition: Status is: Inpatient Remains inpatient appropriate  because: requiring rx          Planned Discharge Destination: Home     Time spent: 45 minutes  Author: Elmarie Shiley, MD 03/08/2021 2:50 PM  For on call review [email or web address].

## 2021-03-08 NOTE — Progress Notes (Signed)
° ° °  Chart Reviewed. Patient assessed at bedside.   [name] is awake and alert. Appears comfortable. States he has some pain intermittently but overall is well controlled. Appetite remains good.   His niece and nephew are at the bedside. We discussed goals of discharging home over the weekend with hospice support. Family verbalized understanding. Patient expressed readiness to return to his home but with some anxiety regarding not having pain controlled. Education provided on regimen and he understands he will discharge wit hall needed medications.   All questions answered and support provided.   Assessment NAD, AAO x4 RRR Diminished bilaterally  Plan -D/C home over the weekend with hospice support. Family plans to brink him for his last week of radiation and once completed focus on his comfort.  -TOC referral for outpatient hospice  -I will plan to follow up with his wife and sister tomorrow for further discussions.  -Continue with current plan of care  -PMT will continue to support and follow as needed.   Symptom Management Recommendations/Plan: Pain, cancer related:  OxyContin 30 mg 3 times daily (increased from 20 mg)  Overall, this is difficult to treat pain secondary to bony metastasis and would recommend continuation of steroids as well as plan for radiation therapy. Gabapentin 300 mg 3 times daily  Dilaudid 4-6 mg every 3 hours as needed for breakthrough pain (increased with a range of 4-6 mg) Roxanol 10-20 mg every 2 hours as needed for breakthrough pain in preparation of discharging home     Constipation Miralax 2 times daily   Time Total: 25 min.   Visit consisted of counseling and education dealing with the complex and emotionally intense issues of symptom management and palliative care in the setting of serious and potentially life-threatening illness.Greater than 50%  of this time was spent counseling and coordinating care related to the above assessment and  plan.  Alda Lea, AGPCNP-BC  Palliative Medicine Team [phone]

## 2021-03-08 NOTE — Progress Notes (Signed)
° ° °  Chart Reviewed. Patient assessed at bedside.   Jimmy Hawkins is resting in bed. Easily awakened. Wife and sister present. States he did not sleep well last night and is sleepy this morning. Ambulatory with walker.   Lengthy discussion with family regarding goals of care. Shanon Brow, Interpretor) assisted in conversation. Goal is for patient to discharge home with family over the weekend hopefully. They expressed need for hospital bed and assistance with transporting patient home.   We discussed at length hospice support outpatient. Education provided on their goals and philosophy of care. Family verbalized understanding. They reside in [place] and wishes to have referral sent to [facility] in case patient would need to transfer to residential hospice facility.   Family is anxious about patient not having pain medication in the home or how to administer. We discussed his current regimen and frequency. Also advised the hospice team will come out to the home and provide detailed education to family and will also be available for support. They verbalized understanding and appreciation.   Sister shares they will begin preparing the home for patient to return and making arrangements with family in preparation for hospital bed delivery. They will attempt to bring him in for his remaining radiation treatments on next week however with realistic expectations if this is difficult they will forego remaining treatments and focus on his comfort in the home.   All questions answered and support provided.   Assessment NAD, AAO x4 RRR Diminished bilaterally  Plan -D/C home over the weekend with hospice support. Family plans to bring him for his last week of radiation and once completed focus on his comfort. If patient unable to tolerate daily transport family is realistic in their understanding with intent to forego treatment at that time.  -TOC referral for outpatient hospice. Patient will need hospital bed and  PTAR transport home.  -Continue with current plan of care  -PMT will continue to support and follow as needed.   Symptom Management Recommendations/Plan: Pain, cancer related:  OxyContin 30 mg 3 times daily (increased from 20 mg)  Overall, this is difficult to treat pain secondary to bony metastasis and would recommend continuation of steroids as well as plan for radiation therapy. Gabapentin 300 mg 3 times daily  Dilaudid 4-6 mg every 3 hours as needed for breakthrough pain (increased with a range of 4-6 mg) Roxanol 10-20 mg every 2 hours as needed for breakthrough pain in preparation of discharging home     Constipation Miralax 2 times daily   Time Total: 40 min.   Visit consisted of counseling and education dealing with the complex and emotionally intense issues of symptom management and palliative care in the setting of serious and potentially life-threatening illness.Greater than 50%  of this time was spent counseling and coordinating care related to the above assessment and plan.  Alda Lea, AGPCNP-BC  Palliative Medicine Team [phone]

## 2021-03-08 NOTE — TOC Progression Note (Signed)
Transition of Care [facility]) - Progression Note   Patient Details  Name: Jimmy Hawkins MRN: [id number] Date of Birth: Oct 26, 1975  Transition of Care [facility] CM/SW [place], LCSW Phone Number: 03/08/2021, 1:10 PM  Clinical Narrative: Referral made to [facility] for home hospice. [facility] reviewing referral as patient has 5 more radiation treatments. TOC awaiting update from [place].  Expected Discharge Plan: Home w Hospice Care Barriers to Discharge: Continued Medical Work up  Expected Discharge Plan and Services Expected Discharge Plan: [place] Acute Care Choice: Hospice             DME Arranged: N/A DME Agency: NA  Readmission Risk Interventions No flowsheet data found.

## 2021-03-09 DIAGNOSIS — E871 Hypo-osmolality and hyponatremia: Secondary | ICD-10-CM

## 2021-03-09 MED ORDER — HEPARIN SOD (PORK) LOCK FLUSH 100 UNIT/ML IV SOLN
500.0000 [IU] | Freq: Once | INTRAVENOUS | Status: AC
  Administered 2021-03-10: 500 [IU] via INTRAVENOUS
  Filled 2021-03-09: qty 5

## 2021-03-09 NOTE — Progress Notes (Signed)
Pt's d/c being held until AM due to inability to obtain necessary comfort meds at this late of a discharge.  Family, Provider, hospice liaison, and charge RN made aware of situation.  Transport cancelled and will be set up for transport home in the morning.

## 2021-03-09 NOTE — Discharge Summary (Addendum)
Physician Discharge Summary   Patient: Jimmy Hawkins MRN: [id number] DOB: [birth year]  Admit date:     02/21/2021  Discharge date: 03/09/21  Discharge Physician: Elmarie Shiley   PCP: [name], MD   Recommendations at discharge:    He will need to complete 5 more days of radiation treatment until 2/17.  Discharge Diagnoses: Principal Problem:   Metastatic cancer ([place]) Active Problems:   Malignant neoplasm of esophagus (HCC)   Bone metastases (HCC)   Acute metabolic encephalopathy   Alcohol abuse   Cancer related pain   Anemia   Lower extremity weakness   Difficulty urinating   Myoclonic jerking   Steroid-induced hyperglycemia   Hypokalemia   Hyponatremia  Resolved Problems:   * No resolved hospital problems. *   Hospital Course: 46 yo with hx etoh abuse and metastatic esophageal cancer was on palliative chemotherapy with Dr. Burr Medico.  He called Dr. Burr Medico on the day of admission with worsening hip pain and he was directed to the ED.  MRI showed a large sacral met.  He's been started on steroids and radiation therapy.  Palliative care working on pain regimen.  Per Dr. Burr Medico, plan  to transition to home hospice. He will complete Radiation treatment out patient. He has 5 more treatment until 2/17.     Assessment and Plan: Malignant neoplasm of esophagus ([place]) Followed by Dr. Burr Medico Plan to transition to [place]. Last radiation Tx 2/17/ he will complete Tx outpatient.   Acute metabolic encephalopathy Suspect related to pain meds, vs alcohol use.  Ammonia wnl, b12 elevated, folate wnl, VBG without hypercarbia EEG with moderate diffuse encephalopathy, nonspecific etiology - non epileptic jerking episodes - no seizures or epileptiform discharges. Received High dose  thiamine given hx etoh abuse Resolved.   Bone metastases ([place])- (present on admission) -CT with large sacral metastatic lesion 8.6x6.1 with extraosseous extension -PET 1/17 with increase in size of  metastatic lesion in L hemisacrum -MRI sacrum with large met with central necrosis, extensive involvement of S1, S2, and S3 segments with lesser involvement of upper S4.  Lesion is more extensive to L of midline, but involves all sacral foramina.  Tumor fills sacral spinal canal and extends up to L5-S1 disc level.  Extends into scaral hiatus on the L.  Pathologic fx at S2.  Grossing of L SI joint with involvement of iliac. Started on Radiation Therapy t 02/26/2021, last dose of Radiation to be complete 2/17. Plan to discharge today home with hospice. He will complete Radiation tx out patient.   Hyponatremia Stable. Monitor.   Hypokalemia Replaced.   Steroid-induced hyperglycemia a1c 4.8 CBG stable 120 range  Myoclonic jerking Related to pain meds Improved with transition to oxycodone  Difficulty urinating Likely related to metastatic disease. Continue with  Flomax.  He has been able to urinate.   Lower extremity weakness Likely related to mets.  Started  dexamethasone Continue with radiation Tx.  Notice some improvement.    Anemia Chronic, Related to malignancy.  Normal B12, folate. AOCD with iron deficiency.  Hb stable.   Cancer related pain Transitioned to oxycontin 20 mg TID per palliative care Continue IV dilaudid prn Oral Dilaudid prn Steroids Palliative care for assistance with pain - appreciate assistance Pain better controlled.  Palliative added sublingual morphine for rescue pain.   Alcohol abuse- (present on admission) Completed CIWA. Received high dose thiamine.  No evidence of withdrawal.          Pain control - [facility]  Controlled Substance Reporting System database was reviewed. and patient was instructed, not to drive, operate heavy machinery, perform activities at heights, swimming or participation in water activities or provide baby-sitting services while on Pain, Sleep and Anxiety Medications; until their outpatient Physician has advised  to do so again. Also recommended to not to take more than prescribed Pain, Sleep and Anxiety Medications.   Consultants: Palliative, radiation oncology, Oncology  Procedures performed: radiation Tx Disposition: Home Diet recommendation:  Discharge Diet Orders (From admission, onward)     Start     Ordered   03/09/21 0000  Diet - low sodium heart healthy        03/09/21 0823           Regular diet  DISCHARGE MEDICATION: Allergies as of 03/09/2021   No Known Allergies      Medication List     STOP taking these medications    famotidine 20 MG tablet Commonly known as: PEPCID   HYDROcodone-acetaminophen 7.5-325 mg/15 ml solution Commonly known as: HYCET   megestrol 20 MG tablet Commonly known as: MEGACE Replaced by: megestrol 400 MG/10ML suspension   morphine 60 MG 12 hr tablet Commonly known as: MS CONTIN Replaced by: morphine CONCENTRATE 10 MG/0.5ML Soln concentrated solution       TAKE these medications    dexamethasone 4 MG tablet Commonly known as: DECADRON Take 1 tablet (4 mg total) by mouth 3 (three) times daily. What changed:  how much to take how to take this when to take this   ferrous sulfate 325 (65 FE) MG tablet TAKE 1 TABLET BY MOUTH 2 TIMES DAILY. What changed:  how much to take Another medication with the same name was removed. Continue taking this medication, and follow the directions you see here.   gabapentin 300 MG capsule Commonly known as: NEURONTIN Take 1 capsule (300 mg total) by mouth 3 (three) times daily.   HYDROmorphone 4 MG tablet Commonly known as: DILAUDID Take 1-1.5 tablets (4-6 mg total) by mouth every 3 (three) hours as needed for severe pain or moderate pain. What changed:  medication strength how much to take when to take this reasons to take this   megestrol 400 MG/10ML suspension Commonly known as: MEGACE Take 10 mLs (400 mg total) by mouth daily. Replaces: megestrol 20 MG tablet   morphine CONCENTRATE  10 MG/0.5ML Soln concentrated solution Take 0.5-1 mLs (10-20 mg total) by mouth every 2 (two) hours as needed for severe pain, moderate pain or anxiety. Replaces: morphine 60 MG 12 hr tablet   oxyCODONE 30 MG 12 hr tablet Take 1 tablet (30 mg total) by mouth every 8 (eight) hours.   pantoprazole 40 MG tablet Commonly known as: PROTONIX Take 1 tablet (40 mg total) by mouth 2 (two) times daily before a meal.   polyethylene glycol 17 g packet Commonly known as: MIRALAX / GLYCOLAX Take 17 g by mouth 2 (two) times daily.   prochlorperazine 10 MG tablet Commonly known as: COMPAZINE Take 1 tablet by mouth every 6 hours as needed for nausea or vomiting.   senna-docusate 8.6-50 MG tablet Commonly known as: Senokot S Take 2 tablet by mouth once a day What changed:  how much to take how to take this when to take this   sucralfate 1 g tablet Commonly known as: Carafate Take 1 tablet by mouth 4 times daily. (Dissolve in 15 mls of water before taking)   tamsulosin 0.4 MG Caps capsule Commonly known as: FLOMAX Take 1  capsule (0.4 mg total) by mouth daily after breakfast.   traZODone 50 MG tablet Commonly known as: DESYREL Take 1 tablet (50 mg total) by mouth at bedtime.   Tylenol PM Extra Strength 50-1000 MG/30ML Liqd Generic drug: diphenhydrAMINE-APAP (sleep) Take 15 mLs by mouth at bedtime as needed (sleep).         Discharge Exam: Filed Weights   02/21/21 1301  Weight: 69.1 kg   General; no acute distress.  Lung; CTA Abdomen; soft, nt  Condition at discharge: stable  The results of significant diagnostics from this hospitalization (including imaging, microbiology, ancillary and laboratory) are listed below for reference.   Imaging Studies: CT ABDOMEN PELVIS W CONTRAST  Result Date: 02/21/2021 CLINICAL DATA:  Acute abdominal pain. Patient reports right left hip pain. History of esophageal cancer with bone metastasis. EXAM: CT ABDOMEN AND PELVIS WITH CONTRAST  TECHNIQUE: Multidetector CT imaging of the abdomen and pelvis was performed using the standard protocol following bolus administration of intravenous contrast. RADIATION DOSE REDUCTION: This exam was performed according to the departmental dose-optimization program which includes automated exposure control, adjustment of the mA and/or kV according to patient size and/or use of iterative reconstruction technique. CONTRAST:  142m OMNIPAQUE IOHEXOL 300 MG/ML  SOLN COMPARISON:  PET CT 9 days ago FINDINGS: Lower chest: Similar distal esophageal wall thickening to prior. No basilar airspace disease, pleural effusion, or nodule. The heart is normal in size. Hepatobiliary: 13 mm low-density lesion in the inferior right lobe of the liver, series 2, image 39. No other liver lesions. Small gallstones without cholecystitis. No biliary dilatation. Pancreas: No ductal dilatation or inflammation. Spleen: Normal in size without focal abnormality. Splenules anterior and medially. Adrenals/Urinary Tract: Normal adrenal glands. No hydronephrosis or perinephric edema. Homogeneous renal enhancement with symmetric excretion on delayed phase imaging. Tiny hypodensity in the right kidney is too small to characterize but likely small cyst. No renal calculi. Urinary bladder is physiologically distended without wall thickening. Stomach/Bowel: Stomach physiologically distended. There is no small bowel obstruction or inflammation. Normal appendix. Moderate volume of colonic stool without colonic wall thickening. Sigmoid colon is redundant. Left sacral mass approaches the left aspect of the sigmoid colon, however no direct continuity, fat plane is demonstrated. Vascular/Lymphatic: Normal caliber abdominal aorta. Patent portal and splenic veins. Scattered small retroperitoneal nodes are not enlarged by size criteria. There is no pelvic adenopathy. Reproductive: Prostate is unremarkable. Other: Presacral soft tissue edema, large sacral lesion is  described below. Minimal fat in the inguinal canals. Small fat containing umbilical hernia. No ascites or free air. Musculoskeletal: Large sacral metastatic lesion is again seen. This measures at least 8.6 x 6.1 cm and is centered in the left sacrum, crosses the midline to involve the right aspect of the sacrum. There is extraosseous extension extending in the anterior presacral space. This lesion crosses the left sacroiliac joint and involves the iliac bone. No evidence of new or additional osseous lesions. IMPRESSION: 1. Large sacral metastatic lesion measuring at least 8.6 x 6.1 cm with extraosseous extension. This lesion approaches the left aspect of the sigmoid colon, however no direct continuity with the sigmoid colon is demonstrated. No significant change in recent PET CT. 2. Unchanged 13 mm low-density lesion in the inferior right lobe of the liver, suspicious for metastatic disease. 3. Cholelithiasis without cholecystitis. 4. Similar distal esophageal wall thickening, known esophageal malignancy. Electronically Signed   By: MKeith RakeM.D.   On: 02/21/2021 16:43   MR SACRUM SI JOINTS W WO CONTRAST  Result Date: 02/21/2021 CLINICAL DATA:  Esophageal cancer with metastatic disease. EXAM: MRI SACRUM WITH CONTRAST TECHNIQUE: Multiplanar multi-sequence MR imaging of the sacrum was performed. CONTRAST:  7 cc Gadavist COMPARISON:  CT earlier same day. FINDINGS: 8 x 10 mm centrally necrotic metastasis affecting the sacrum and surrounding soft tissues. This affects the S1, S2, S3 and upper S4 segments and is more extensive to the left of midline than the right. Tumor fills the sacral spinal canal and extends at least as far as the L5-S1 disc space. All sacral neural foramina are involved. Tumor extends into the presacral space and also extends dorsal to the sacrum to the left of midline. Pathologic fracture at the S2 segment with about 4 mm of offset. Tumor crosses the left sacroiliac joint and involves  the left iliac bone. Tumor extends into the sacral hiatus on the left. IMPRESSION: Large metastasis affecting the sacrum with central necrosis. Extensive involvement of the S1, S2 and S3 segments with lesser involvement of upper S4. Lesion is more extensive to the left of midline but does involve all sacral foramina. Tumor fills the sacral spinal canal and extends up to the L5-S1 disc level. Tumor extends into the sacral hiatus on the left. Pathologic fracture at S2 with about 4 mm of offset. Crossing of the left SI joint with involvement of the iliac. Electronically Signed   By: Nelson Chimes M.D.   On: 02/21/2021 20:18   NM PET Image Restag (PS) Skull Base To Thigh  Addendum Date: 02/21/2021   ADDENDUM REPORT: 02/21/2021 17:11 ADDENDUM: Upon re-review of PET-CT performed February 12, 2021, the following findings are noted: Within the inferior right lobe of the liver there is a subtle hypodense 9 mm lesion on image 126/4 which demonstrates hypermetabolic activity with a max SUV of 3.93, suspicious for metastatic disease. These results will be called to the ordering clinician or representative by the Radiologist Assistant, and communication documented in the PACS or [employer]. Electronically Signed   By: Dahlia Bailiff M.D.   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 EXAM: NUCLEAR MEDICINE PET SKULL BASE TO THIGH TECHNIQUE: 8.15 mCi F-18 FDG was injected intravenously. Full-ring PET imaging was performed from the skull base to thigh after the radiotracer. CT data was obtained and used for attenuation correction and anatomic localization. Fasting blood glucose: 103 mg/dl COMPARISON:  Multiple priors including most recent PET-CT November 08, 2020 FINDINGS: Mediastinal blood pool activity: SUV max 1.42 Liver activity: SUV max NA NECK: No hypermetabolic lymph nodes in the neck.  Incidental CT findings: none CHEST: Similar chronic wall thickening involving the mid to distal esophagus with max SUV of 3.85 previously 6.2. Also similar prior is indistinctness of the tissue planes adjacent to the affected segment of the esophagus. No hypermetabolic thoracic adenopathy identified. Incidental CT findings: Stable small pericardial effusion. Right chest Port-A-Cath with tip in the right atrium. ABDOMEN/PELVIS: No abnormal hypermetabolic activity within the liver, pancreas, adrenal glands, or spleen. No hypermetabolic lymph nodes in the abdomen or pelvis. Incidental CT findings: Cholelithiasis without evidence of acute cholecystitis. Unremarkable noncontrast appearance of the hepatic, pancreatic and splenic parenchyma. No hydronephrosis. No evidence of bowel obstruction. Normal appendix. Mild circumferential wall thickening of an incompletely distended urinary bladder appears similar prior. SKELETON: Increase in size of the large hypermetabolic centrally necrotic osseous metastatic lesion centered in the left hemi sacrum with erosion through  the left SI joint and increased erosion into the sacrum and sacral spinal canal measuring 9.7 x 7.8 cm with a max SUV of 9.63 previously 7.7 x 6.4 cm with a max SUV of 9.8. Post radiation change in the mid/lower thoracic spine. Extensive hypermetabolic marrow activity involving the axial and proximal appendicular skeleton without new/additional suspicious osseous lesion, favored to reflect marrow stimulation. Incidental CT findings: none IMPRESSION: 1. Increase in size of the peripherally hypermetabolic centrally necrotic metastatic lesion centered in the left hemisacrum. 2. Similar mild metabolic activity associated with the thickened segment of mid to distal esophagus which corresponds with the original mass and is favored to reflect posttreatment change. 3. Extensive hypermetabolic marrow activity involving the axial and proximal appendicular skeleton without  new/additional suspicious osseous lesion, favored to reflect marrow stimulation. Electronically Signed: By: Dahlia Bailiff M.D. On: 02/13/2021 09:16   EEG adult  Result Date: 02/22/2021 Lora Havens, MD     02/22/2021 12:14 PM Patient Name: Artemis Loyal MRN: [id number] Epilepsy Attending: Lora Havens Referring Physician/Provider: Elodia Florence., MD Date: 02/22/2021 Duration: 22.11 mins Patient history: 46 year old male with malignant neoplasm of esophagus, metastatic disease to bone who presented with altered mental status and jerking.  EEG to evaluate for seizure. Level of alertness: Awake AEDs during EEG study: Ativan Technical aspects: This EEG study was done with scalp electrodes positioned according to the 10-20 International system of electrode placement. Electrical activity was acquired at a sampling rate of 500Hz and reviewed with a high frequency filter of 70Hz and a low frequency filter of 1Hz. EEG data were recorded continuously and digitally stored. Description: The posterior dominant rhythm consists of 9 Hz activity of moderate voltage (25-35 uV) seen predominantly in posterior head regions, symmetric and reactive to eye opening and eye closing. EEG also showed continuous generalized 3 to 6 Hz theta-delta slowing as well as 15 to 18 Hz beta activity in frontocentral region. Patient was noted to have multiple episodes of brief whole-body spontaneous, nonrhythmic jerking.  Concomitant EEG before, during and after the event did not show any EEG to suggest seizure. Hyperventilation and photic stimulation were not performed.   ABNORMALITY - Continuous slow, generalized IMPRESSION: This study is suggestive of moderate diffuse encephalopathy, nonspecific etiology. Patient was noted to have multiple episodes of brief whole body spontaneous, nonrhythmic jerking without concomitant EEG change.  These episodes were Non-epileptic. No seizures or epileptiform discharges were seen  throughout the recording. Lora Havens    Microbiology: Results for orders placed or performed during the hospital encounter of 02/21/21  Resp Panel by RT-PCR (Flu A&B, Covid) Nasopharyngeal Swab     Status: None   Collection Time: 02/21/21  6:31 PM   Specimen: Nasopharyngeal Swab; Nasopharyngeal(NP) swabs in vial transport medium  Result Value Ref Range Status   SARS Coronavirus 2 by RT PCR NEGATIVE NEGATIVE Final    Comment: (NOTE) SARS-CoV-2 target nucleic acids are NOT DETECTED.  The SARS-CoV-2 RNA is generally detectable in upper respiratory specimens during the acute phase of infection. The lowest concentration of SARS-CoV-2 viral copies this assay can detect is 138 copies/mL. A negative result does not preclude SARS-Cov-2 infection and should not be used as the sole basis for treatment or other patient management decisions. A negative result may occur with  improper specimen collection/handling, submission of specimen other than nasopharyngeal swab, presence of viral mutation(s) within the areas targeted by this assay, and inadequate number of viral copies(<138 copies/mL). A negative result must  be combined with clinical observations, patient history, and epidemiological information. The expected result is Negative.  Fact Sheet for Patients:  EntrepreneurPulse.com.au  Fact Sheet for Healthcare Providers:  IncredibleEmployment.be  This test is no t yet approved or cleared by the [place] FDA and  has been authorized for detection and/or diagnosis of SARS-CoV-2 by FDA under an Emergency Use Authorization (EUA). This EUA will remain  in effect (meaning this test can be used) for the duration of the COVID-19 declaration under Section 564(b)(1) of the Act, 21 U.S.C.section 360bbb-3(b)(1), unless the authorization is terminated  or revoked sooner.       Influenza A by PCR NEGATIVE NEGATIVE Final   Influenza B by PCR NEGATIVE  NEGATIVE Final    Comment: (NOTE) The Xpert Xpress SARS-CoV-2/FLU/RSV plus assay is intended as an aid in the diagnosis of influenza from Nasopharyngeal swab specimens and should not be used as a sole basis for treatment. Nasal washings and aspirates are unacceptable for Xpert Xpress SARS-CoV-2/FLU/RSV testing.  Fact Sheet for Patients: EntrepreneurPulse.com.au  Fact Sheet for Healthcare Providers: IncredibleEmployment.be  This test is not yet approved or cleared by the [place] FDA and has been authorized for detection and/or diagnosis of SARS-CoV-2 by FDA under an Emergency Use Authorization (EUA). This EUA will remain in effect (meaning this test can be used) for the duration of the COVID-19 declaration under Section 564(b)(1) of the Act, 21 U.S.C. section 360bbb-3(b)(1), unless the authorization is terminated or revoked.  Performed at [facility], [place] [address] [place], [place] [address]     Labs: CBC: Recent Labs  Lab 03/05/21 0500  WBC 2.8*  HGB 10.9*  HCT 32.7*  MCV 97.6  PLT 855*   Basic Metabolic Panel: Recent Labs  Lab 03/05/21 0500 03/06/21 0455  NA 131* 130*  K 3.3* 3.7  CL 98 97*  CO2 26 26  GLUCOSE 138* 125*  BUN 15 13  CREATININE 0.44* 0.43*  CALCIUM 8.1* 8.1*   Liver Function Tests: No results for input(s): AST, ALT, ALKPHOS, BILITOT, PROT, ALBUMIN in the last 168 hours. CBG: No results for input(s): GLUCAP in the last 168 hours.  Discharge time spent: greater than 30 minutes.  Signed: Elmarie Shiley, MD [facility] 03/09/2021

## 2021-03-09 NOTE — TOC Transition Note (Signed)
Transition of Care [facility]) - CM/SW Discharge Note   Patient Details  Name: Jimmy Hawkins MRN: [id number] Date of Birth: 08-24-1975  Transition of Care [facility]) CM/SW Contact:  Trish Mage, LCSW Phone Number: 03/09/2021, 10:49 AM   Clinical Narrative:   Confirmed with sister that they are needing PTAR transport for patient at [place] dispatch and requested same. TOC sign off.    Final next level of care: Home w Hospice Care Barriers to Discharge: Barriers Resolved   Patient Goals and CMS Choice        Discharge Placement                       Discharge Plan and Services     Post Acute Care Choice: Hospice          DME Arranged: N/A DME Agency: NA                  Social Determinants of Health (SDOH) Interventions     Readmission Risk Interventions No flowsheet data found.

## 2021-03-09 NOTE — Plan of Care (Signed)
Patient to be Dc'd home with family this AM.     Problem: Education: Goal: Knowledge of General Education information will improve Description: Including pain rating scale, medication(s)/side effects and non-pharmacologic comfort measures Outcome: Adequate for Discharge   Problem: Health Behavior/Discharge Planning: Goal: Ability to manage health-related needs will improve Outcome: Adequate for Discharge   Problem: Clinical Measurements: Goal: Ability to maintain clinical measurements within normal limits will improve Outcome: Adequate for Discharge Goal: Will remain free from infection Outcome: Adequate for Discharge Goal: Diagnostic test results will improve Outcome: Adequate for Discharge Goal: Respiratory complications will improve Outcome: Adequate for Discharge Goal: Cardiovascular complication will be avoided Outcome: Adequate for Discharge   Problem: Activity: Goal: Risk for activity intolerance will decrease Outcome: Adequate for Discharge   Problem: Nutrition: Goal: Adequate nutrition will be maintained Outcome: Adequate for Discharge   Problem: Coping: Goal: Level of anxiety will decrease Outcome: Adequate for Discharge   Problem: Elimination: Goal: Will not experience complications related to bowel motility Outcome: Adequate for Discharge Goal: Will not experience complications related to urinary retention Outcome: Adequate for Discharge   Problem: Elimination: Goal: Will not experience complications related to urinary retention Outcome: Adequate for Discharge   Problem: Pain Managment: Goal: General experience of comfort will improve Outcome: Adequate for Discharge   Problem: Safety: Goal: Ability to remain free from injury will improve Outcome: Adequate for Discharge   Problem: Skin Integrity: Goal: Risk for impaired skin integrity will decrease Outcome: Adequate for Discharge

## 2021-03-09 NOTE — Progress Notes (Signed)
Manufacturing engineer [facility]) Hospital Liaison: RN note     Notified by Jimmy Footman, NP of patient/family request for [facility] services at home after discharge. Chart and patient information under reviewed by [facility] physician. Hospice eligibility confirmed.    [facility] representative spoke with patient and family to initiate education related to hospice philosophy, services and team approach to care.  Patient and family verbalized understanding of information given.  Please send signed and completed DNR form home with patient/family. Patient will need prescriptions for discharge comfort medications.     Patient is planned to have 5 additional palliative radiation treatments that will be covered by current payer source and not covered under hospice.    DME needs have been discussed, patient currently has the following equipment in the home: none.  Patient/family requests the following DME for delivery to the home: hospital bed and OBT. [employer] equipment manager has been notified and will contact DME provider to arrange delivery to the home. Home address has been verified and is correct in the chart. Claudie Leach is the family member to contact to arrange time of delivery. DME has been delivered and confirmed by [facility] staff.     [facility] aware of the above. Please notify [facility] when patient is ready to leave the unit at discharge. (Call [phone] or [phone] after 5pm.) [facility] information and contact numbers given to family.       Please do not hesitate to call with questions.    Thank you,   Clementeen Hoof, BSN, RN [facility] Liaison   [phone]

## 2021-03-09 NOTE — Assessment & Plan Note (Signed)
Stable.  Monitor.  

## 2021-03-10 MED ORDER — OXYCODONE HCL ER 30 MG PO T12A: 30.0000 mg | EXTENDED_RELEASE_TABLET | Freq: Three times a day (TID) | ORAL | 0 refills | Status: DC

## 2021-03-10 MED ORDER — DEXAMETHASONE 4 MG PO TABS: 4.0000 mg | ORAL_TABLET | Freq: Three times a day (TID) | ORAL | 1 refills | Status: AC

## 2021-03-10 MED ORDER — MORPHINE SULFATE (CONCENTRATE) 10 MG/0.5ML PO SOLN: 10.0000 mg | ORAL | 0 refills | Status: AC | PRN

## 2021-03-10 NOTE — Progress Notes (Signed)
Discharge instructions were explained to the Patient's sister yesterday. An updated Medication sheet was given to sister with date and time next medications were due. [facility] was suppose to be here at 09:00 AM, they came to get the patient at 13:05. Family will pick up medications after patient is signed up for [employer]. Patient is going home with Hospice.

## 2021-03-10 NOTE — Progress Notes (Signed)
Manufacturing engineer [facility])  Transport still delayed.  Spoke with EMS, explained circumstances, dispatcher said he was "at the top of the list".   Thank you, Venia Carbon BSN, RN [facility] Liaison

## 2021-03-10 NOTE — Discharge Summary (Signed)
Physician Discharge Summary   Patient: Jimmy Hawkins MRN: [id number] DOB: 02/05/1975  Admit date:     02/21/2021  Discharge date: 03/10/21  Discharge Physician: Elmarie Shiley   PCP: [name], MD   Recommendations at discharge:    He will need to complete 5 more days of radiation treatment until 2/17.  Discharge Diagnoses: Principal Problem:   Metastatic cancer ([place]) Active Problems:   Malignant neoplasm of esophagus (HCC)   Bone metastases (HCC)   Acute metabolic encephalopathy   Alcohol abuse   Cancer related pain   Anemia   Lower extremity weakness   Difficulty urinating   Myoclonic jerking   Steroid-induced hyperglycemia   Hypokalemia   Hyponatremia  Resolved Problems:   * No resolved hospital problems. *   Hospital Course: 46 yo with hx etoh abuse and metastatic esophageal cancer was on palliative chemotherapy with Dr. Burr Medico.  He called Dr. Burr Medico on the day of admission with worsening hip pain and he was directed to the ED.  MRI showed a large sacral met.  He's been started on steroids and radiation therapy.  Palliative care working on pain regimen.  Per Dr. Burr Medico, plan  to transition to home hospice. He will complete Radiation treatment out patient. He has 5 more treatment until 2/17.   Stable for discharge. Discharge delay due to lack of medication available, Pharmacy was closed. Family to get medications today.    Assessment and Plan: Malignant neoplasm of esophagus ([place]) Followed by Dr. Burr Medico Plan to transition to [place]. Last radiation Tx 2/17/ he will complete Tx outpatient.   Acute metabolic encephalopathy Suspect related to pain meds, vs alcohol use.  Ammonia wnl, b12 elevated, folate wnl, VBG without hypercarbia EEG with moderate diffuse encephalopathy, nonspecific etiology - non epileptic jerking episodes - no seizures or epileptiform discharges. Received High dose  thiamine given hx etoh abuse Resolved.   Bone metastases ([place])-  (present on admission) -CT with large sacral metastatic lesion 8.6x6.1 with extraosseous extension -PET 1/17 with increase in size of metastatic lesion in L hemisacrum -MRI sacrum with large met with central necrosis, extensive involvement of S1, S2, and S3 segments with lesser involvement of upper S4.  Lesion is more extensive to L of midline, but involves all sacral foramina.  Tumor fills sacral spinal canal and extends up to L5-S1 disc level.  Extends into scaral hiatus on the L.  Pathologic fx at S2.  Grossing of L SI joint with involvement of iliac. Started on Radiation Therapy t 02/26/2021, last dose of Radiation to be complete 2/17. Plan to discharge today home with hospice. He will complete Radiation tx out patient.   Hyponatremia Stable. Monitor.   Hypokalemia Replaced.   Steroid-induced hyperglycemia a1c 4.8 CBG stable 120 range  Myoclonic jerking Related to pain meds Improved with transition to oxycodone  Difficulty urinating Likely related to metastatic disease. Continue with  Flomax.  He has been able to urinate.   Lower extremity weakness Likely related to mets.  Started  dexamethasone Continue with radiation Tx.  Notice some improvement.    Anemia Chronic, Related to malignancy.  Normal B12, folate. AOCD with iron deficiency.  Hb stable.   Cancer related pain Transitioned to oxycontin 20 mg TID per palliative care Continue IV dilaudid prn Oral Dilaudid prn Steroids Palliative care for assistance with pain - appreciate assistance Pain better controlled.  Palliative added sublingual morphine for rescue pain.   Alcohol abuse- (present on admission) Completed CIWA. Received high dose  thiamine.  No evidence of withdrawal.          Pain control - [place] Controlled Substance Reporting System database was reviewed. and patient was instructed, not to drive, operate heavy machinery, perform activities at heights, swimming or participation in  water activities or provide baby-sitting services while on Pain, Sleep and Anxiety Medications; until their outpatient Physician has advised to do so again. Also recommended to not to take more than prescribed Pain, Sleep and Anxiety Medications.   Consultants: Palliative, radiation oncology, Oncology  Procedures performed: radiation Tx Disposition: Home Diet recommendation:  Discharge Diet Orders (From admission, onward)     Start     Ordered   03/09/21 0000  Diet - low sodium heart healthy        03/09/21 0823           Regular diet  DISCHARGE MEDICATION: Allergies as of 03/10/2021   No Known Allergies      Medication List     STOP taking these medications    famotidine 20 MG tablet Commonly known as: PEPCID   HYDROcodone-acetaminophen 7.5-325 mg/15 ml solution Commonly known as: HYCET   megestrol 20 MG tablet Commonly known as: MEGACE Replaced by: megestrol 400 MG/10ML suspension   morphine 60 MG 12 hr tablet Commonly known as: MS CONTIN Replaced by: morphine CONCENTRATE 10 MG/0.5ML Soln concentrated solution       TAKE these medications    dexamethasone 4 MG tablet Commonly known as: DECADRON Take 1 tablet (4 mg total) by mouth 3 (three) times daily. What changed:  how much to take how to take this when to take this   ferrous sulfate 325 (65 FE) MG tablet TAKE 1 TABLET BY MOUTH 2 TIMES DAILY. What changed:  how much to take Another medication with the same name was removed. Continue taking this medication, and follow the directions you see here.   gabapentin 300 MG capsule Commonly known as: NEURONTIN Take 1 capsule (300 mg total) by mouth 3 (three) times daily.   HYDROmorphone 4 MG tablet Commonly known as: DILAUDID Take 1-1.5 tablets (4-6 mg total) by mouth every 3 (three) hours as needed for severe pain or moderate pain. What changed:  medication strength how much to take when to take this reasons to take this   megestrol 400 MG/10ML  suspension Commonly known as: MEGACE Take 10 mLs (400 mg total) by mouth daily. Replaces: megestrol 20 MG tablet   morphine CONCENTRATE 10 MG/0.5ML Soln concentrated solution Take 0.5-1 mLs (10-20 mg total) by mouth every 2 (two) hours as needed for severe pain, moderate pain or anxiety. Replaces: morphine 60 MG 12 hr tablet   oxyCODONE 30 MG 12 hr tablet Take 1 tablet (30 mg total) by mouth every 8 (eight) hours.   pantoprazole 40 MG tablet Commonly known as: PROTONIX Take 1 tablet (40 mg total) by mouth 2 (two) times daily before a meal.   polyethylene glycol 17 g packet Commonly known as: MIRALAX / GLYCOLAX Take 17 g by mouth 2 (two) times daily.   prochlorperazine 10 MG tablet Commonly known as: COMPAZINE Take 1 tablet by mouth every 6 hours as needed for nausea or vomiting.   senna-docusate 8.6-50 MG tablet Commonly known as: Senokot S Take 2 tablet by mouth once a day What changed:  how much to take how to take this when to take this   sucralfate 1 g tablet Commonly known as: Carafate Take 1 tablet by mouth 4 times daily. (Dissolve  in 15 mls of water before taking)   tamsulosin 0.4 MG Caps capsule Commonly known as: FLOMAX Take 1 capsule (0.4 mg total) by mouth daily after breakfast.   traZODone 50 MG tablet Commonly known as: DESYREL Take 1 tablet (50 mg total) by mouth at bedtime.   Tylenol PM Extra Strength 50-1000 MG/30ML Liqd Generic drug: diphenhydrAMINE-APAP (sleep) Take 15 mLs by mouth at bedtime as needed (sleep).         Discharge Exam: Filed Weights   02/21/21 1301  Weight: 69.1 kg   General; no acute distress.  Lung; CTA Abdomen; soft, nt  Condition at discharge: stable  The results of significant diagnostics from this hospitalization (including imaging, microbiology, ancillary and laboratory) are listed below for reference.   Imaging Studies: CT ABDOMEN PELVIS W CONTRAST  Result Date: 02/21/2021 CLINICAL DATA:  Acute abdominal  pain. Patient reports right left hip pain. History of esophageal cancer with bone metastasis. EXAM: CT ABDOMEN AND PELVIS WITH CONTRAST TECHNIQUE: Multidetector CT imaging of the abdomen and pelvis was performed using the standard protocol following bolus administration of intravenous contrast. RADIATION DOSE REDUCTION: This exam was performed according to the departmental dose-optimization program which includes automated exposure control, adjustment of the mA and/or kV according to patient size and/or use of iterative reconstruction technique. CONTRAST:  113m OMNIPAQUE IOHEXOL 300 MG/ML  SOLN COMPARISON:  PET CT 9 days ago FINDINGS: Lower chest: Similar distal esophageal wall thickening to prior. No basilar airspace disease, pleural effusion, or nodule. The heart is normal in size. Hepatobiliary: 13 mm low-density lesion in the inferior right lobe of the liver, series 2, image 39. No other liver lesions. Small gallstones without cholecystitis. No biliary dilatation. Pancreas: No ductal dilatation or inflammation. Spleen: Normal in size without focal abnormality. Splenules anterior and medially. Adrenals/Urinary Tract: Normal adrenal glands. No hydronephrosis or perinephric edema. Homogeneous renal enhancement with symmetric excretion on delayed phase imaging. Tiny hypodensity in the right kidney is too small to characterize but likely small cyst. No renal calculi. Urinary bladder is physiologically distended without wall thickening. Stomach/Bowel: Stomach physiologically distended. There is no small bowel obstruction or inflammation. Normal appendix. Moderate volume of colonic stool without colonic wall thickening. Sigmoid colon is redundant. Left sacral mass approaches the left aspect of the sigmoid colon, however no direct continuity, fat plane is demonstrated. Vascular/Lymphatic: Normal caliber abdominal aorta. Patent portal and splenic veins. Scattered small retroperitoneal nodes are not enlarged by size  criteria. There is no pelvic adenopathy. Reproductive: Prostate is unremarkable. Other: Presacral soft tissue edema, large sacral lesion is described below. Minimal fat in the inguinal canals. Small fat containing umbilical hernia. No ascites or free air. Musculoskeletal: Large sacral metastatic lesion is again seen. This measures at least 8.6 x 6.1 cm and is centered in the left sacrum, crosses the midline to involve the right aspect of the sacrum. There is extraosseous extension extending in the anterior presacral space. This lesion crosses the left sacroiliac joint and involves the iliac bone. No evidence of new or additional osseous lesions. IMPRESSION: 1. Large sacral metastatic lesion measuring at least 8.6 x 6.1 cm with extraosseous extension. This lesion approaches the left aspect of the sigmoid colon, however no direct continuity with the sigmoid colon is demonstrated. No significant change in recent PET CT. 2. Unchanged 13 mm low-density lesion in the inferior right lobe of the liver, suspicious for metastatic disease. 3. Cholelithiasis without cholecystitis. 4. Similar distal esophageal wall thickening, known esophageal malignancy. Electronically Signed  By: Keith Rake M.D.   On: 02/21/2021 16:43   MR SACRUM SI JOINTS W WO CONTRAST  Result Date: 02/21/2021 CLINICAL DATA:  Esophageal cancer with metastatic disease. EXAM: MRI SACRUM WITH CONTRAST TECHNIQUE: Multiplanar multi-sequence MR imaging of the sacrum was performed. CONTRAST:  7 cc Gadavist COMPARISON:  CT earlier same day. FINDINGS: 8 x 10 mm centrally necrotic metastasis affecting the sacrum and surrounding soft tissues. This affects the S1, S2, S3 and upper S4 segments and is more extensive to the left of midline than the right. Tumor fills the sacral spinal canal and extends at least as far as the L5-S1 disc space. All sacral neural foramina are involved. Tumor extends into the presacral space and also extends dorsal to the sacrum to  the left of midline. Pathologic fracture at the S2 segment with about 4 mm of offset. Tumor crosses the left sacroiliac joint and involves the left iliac bone. Tumor extends into the sacral hiatus on the left. IMPRESSION: Large metastasis affecting the sacrum with central necrosis. Extensive involvement of the S1, S2 and S3 segments with lesser involvement of upper S4. Lesion is more extensive to the left of midline but does involve all sacral foramina. Tumor fills the sacral spinal canal and extends up to the L5-S1 disc level. Tumor extends into the sacral hiatus on the left. Pathologic fracture at S2 with about 4 mm of offset. Crossing of the left SI joint with involvement of the iliac. Electronically Signed   By: Nelson Chimes M.D.   On: 02/21/2021 20:18   NM PET Image Restag (PS) Skull Base To Thigh  Addendum Date: 02/21/2021   ADDENDUM REPORT: 02/21/2021 17:11 ADDENDUM: Upon re-review of PET-CT performed February 12, 2021, the following findings are noted: Within the inferior right lobe of the liver there is a subtle hypodense 9 mm lesion on image 126/4 which demonstrates hypermetabolic activity with a max SUV of 3.93, suspicious for metastatic disease. These results will be called to the ordering clinician or representative by the Radiologist Assistant, and communication documented in the PACS or [employer]. Electronically Signed   By: Dahlia Bailiff M.D.   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 EXAM: NUCLEAR MEDICINE PET SKULL BASE TO THIGH TECHNIQUE: 8.15 mCi F-18 FDG was injected intravenously. Full-ring PET imaging was performed from the skull base to thigh after the radiotracer. CT data was obtained and used for attenuation correction and anatomic localization. Fasting blood glucose: 103 mg/dl COMPARISON:  Multiple priors including most recent PET-CT November 08, 2020  FINDINGS: Mediastinal blood pool activity: SUV max 1.42 Liver activity: SUV max NA NECK: No hypermetabolic lymph nodes in the neck. Incidental CT findings: none CHEST: Similar chronic wall thickening involving the mid to distal esophagus with max SUV of 3.85 previously 6.2. Also similar prior is indistinctness of the tissue planes adjacent to the affected segment of the esophagus. No hypermetabolic thoracic adenopathy identified. Incidental CT findings: Stable small pericardial effusion. Right chest Port-A-Cath with tip in the right atrium. ABDOMEN/PELVIS: No abnormal hypermetabolic activity within the liver, pancreas, adrenal glands, or spleen. No hypermetabolic lymph nodes in the abdomen or pelvis. Incidental CT findings: Cholelithiasis without evidence of acute cholecystitis. Unremarkable noncontrast appearance of the hepatic, pancreatic and splenic parenchyma. No hydronephrosis. No evidence of bowel obstruction. Normal appendix. Mild circumferential wall thickening of an incompletely distended urinary bladder appears similar prior. SKELETON: Increase  in size of the large hypermetabolic centrally necrotic osseous metastatic lesion centered in the left hemi sacrum with erosion through the left SI joint and increased erosion into the sacrum and sacral spinal canal measuring 9.7 x 7.8 cm with a max SUV of 9.63 previously 7.7 x 6.4 cm with a max SUV of 9.8. Post radiation change in the mid/lower thoracic spine. Extensive hypermetabolic marrow activity involving the axial and proximal appendicular skeleton without new/additional suspicious osseous lesion, favored to reflect marrow stimulation. Incidental CT findings: none IMPRESSION: 1. Increase in size of the peripherally hypermetabolic centrally necrotic metastatic lesion centered in the left hemisacrum. 2. Similar mild metabolic activity associated with the thickened segment of mid to distal esophagus which corresponds with the original mass and is favored to  reflect posttreatment change. 3. Extensive hypermetabolic marrow activity involving the axial and proximal appendicular skeleton without new/additional suspicious osseous lesion, favored to reflect marrow stimulation. Electronically Signed: By: Dahlia Bailiff M.D. On: 02/13/2021 09:16   EEG adult  Result Date: 02/22/2021 Lora Havens, MD     02/22/2021 12:14 PM Patient Name: Shaydon Lease MRN: [id number] Epilepsy Attending: Lora Havens Referring Physician/Provider: Elodia Florence., MD Date: 02/22/2021 Duration: 22.11 mins Patient history: 46 year old male with malignant neoplasm of esophagus, metastatic disease to bone who presented with altered mental status and jerking.  EEG to evaluate for seizure. Level of alertness: Awake AEDs during EEG study: Ativan Technical aspects: This EEG study was done with scalp electrodes positioned according to the 10-20 International system of electrode placement. Electrical activity was acquired at a sampling rate of '500Hz'  and reviewed with a high frequency filter of '70Hz'  and a low frequency filter of '1Hz' . EEG data were recorded continuously and digitally stored. Description: The posterior dominant rhythm consists of 9 Hz activity of moderate voltage (25-35 uV) seen predominantly in posterior head regions, symmetric and reactive to eye opening and eye closing. EEG also showed continuous generalized 3 to 6 Hz theta-delta slowing as well as 15 to 18 Hz beta activity in frontocentral region. Patient was noted to have multiple episodes of brief whole-body spontaneous, nonrhythmic jerking.  Concomitant EEG before, during and after the event did not show any EEG to suggest seizure. Hyperventilation and photic stimulation were not performed.   ABNORMALITY - Continuous slow, generalized IMPRESSION: This study is suggestive of moderate diffuse encephalopathy, nonspecific etiology. Patient was noted to have multiple episodes of brief whole body spontaneous,  nonrhythmic jerking without concomitant EEG change.  These episodes were Non-epileptic. No seizures or epileptiform discharges were seen throughout the recording. Lora Havens    Microbiology: Results for orders placed or performed during the hospital encounter of 02/21/21  Resp Panel by RT-PCR (Flu A&B, Covid) Nasopharyngeal Swab     Status: None   Collection Time: 02/21/21  6:31 PM   Specimen: Nasopharyngeal Swab; Nasopharyngeal(NP) swabs in vial transport medium  Result Value Ref Range Status   SARS Coronavirus 2 by RT PCR NEGATIVE NEGATIVE Final    Comment: (NOTE) SARS-CoV-2 target nucleic acids are NOT DETECTED.  The SARS-CoV-2 RNA is generally detectable in upper respiratory specimens during the acute phase of infection. The lowest concentration of SARS-CoV-2 viral copies this assay can detect is 138 copies/mL. A negative result does not preclude SARS-Cov-2 infection and should not be used as the sole basis for treatment or other patient management decisions. A negative result may occur with  improper specimen collection/handling, submission of specimen other than nasopharyngeal swab, presence of  viral mutation(s) within the areas targeted by this assay, and inadequate number of viral copies(<138 copies/mL). A negative result must be combined with clinical observations, patient history, and epidemiological information. The expected result is Negative.  Fact Sheet for Patients:  EntrepreneurPulse.com.au  Fact Sheet for Healthcare Providers:  IncredibleEmployment.be  This test is no t yet approved or cleared by the [place] FDA and  has been authorized for detection and/or diagnosis of SARS-CoV-2 by FDA under an Emergency Use Authorization (EUA). This EUA will remain  in effect (meaning this test can be used) for the duration of the COVID-19 declaration under Section 564(b)(1) of the Act, 21 U.S.C.section 360bbb-3(b)(1), unless the  authorization is terminated  or revoked sooner.       Influenza A by PCR NEGATIVE NEGATIVE Final   Influenza B by PCR NEGATIVE NEGATIVE Final    Comment: (NOTE) The Xpert Xpress SARS-CoV-2/FLU/RSV plus assay is intended as an aid in the diagnosis of influenza from Nasopharyngeal swab specimens and should not be used as a sole basis for treatment. Nasal washings and aspirates are unacceptable for Xpert Xpress SARS-CoV-2/FLU/RSV testing.  Fact Sheet for Patients: EntrepreneurPulse.com.au  Fact Sheet for Healthcare Providers: IncredibleEmployment.be  This test is not yet approved or cleared by the [place] FDA and has been authorized for detection and/or diagnosis of SARS-CoV-2 by FDA under an Emergency Use Authorization (EUA). This EUA will remain in effect (meaning this test can be used) for the duration of the COVID-19 declaration under Section 564(b)(1) of the Act, 21 U.S.C. section 360bbb-3(b)(1), unless the authorization is terminated or revoked.  Performed at [facility], [place] [address], [place],  [address]     Labs: CBC: Recent Labs  Lab 03/05/21 0500  WBC 2.8*  HGB 10.9*  HCT 32.7*  MCV 97.6  PLT 622*   Basic Metabolic Panel: Recent Labs  Lab 03/05/21 0500 03/06/21 0455  NA 131* 130*  K 3.3* 3.7  CL 98 97*  CO2 26 26  GLUCOSE 138* 125*  BUN 15 13  CREATININE 0.44* 0.43*  CALCIUM 8.1* 8.1*   Liver Function Tests: No results for input(s): AST, ALT, ALKPHOS, BILITOT, PROT, ALBUMIN in the last 168 hours. CBG: No results for input(s): GLUCAP in the last 168 hours.  Discharge time spent: greater than 30 minutes.  Signed: Elmarie Shiley, MD [facility] 03/10/2021

## 2021-03-11 ENCOUNTER — Ambulatory Visit
Admission: RE | Admit: 2021-03-11 | Discharge: 2021-03-11 | Disposition: A | Discharge status: Home / Self Care | Payer: Self-pay | Source: Ambulatory Visit | Attending: Radiation Oncology | Admitting: Radiation Oncology

## 2021-03-11 ENCOUNTER — Other Ambulatory Visit: Payer: Self-pay

## 2021-03-11 ENCOUNTER — Inpatient Hospital Stay (HOSPITAL_COMMUNITY)
Admission: EM | Admit: 2021-03-11 | Discharge: 2021-03-13 | DRG: 948 | Disposition: A | Discharge status: Hospice | Payer: Self-pay | Attending: Internal Medicine | Admitting: Internal Medicine

## 2021-03-11 ENCOUNTER — Encounter (HOSPITAL_COMMUNITY): Payer: Self-pay | Admitting: Emergency Medicine

## 2021-03-11 DIAGNOSIS — G893 Neoplasm related pain (acute) (chronic): Principal | ICD-10-CM | POA: Diagnosis present

## 2021-03-11 DIAGNOSIS — T451X5A Adverse effect of antineoplastic and immunosuppressive drugs, initial encounter: Secondary | ICD-10-CM | POA: Diagnosis present

## 2021-03-11 DIAGNOSIS — Z66 Do not resuscitate: Secondary | ICD-10-CM | POA: Diagnosis present

## 2021-03-11 DIAGNOSIS — IMO0001 Reserved for inherently not codable concepts without codable children: Secondary | ICD-10-CM

## 2021-03-11 DIAGNOSIS — D630 Anemia in neoplastic disease: Secondary | ICD-10-CM | POA: Diagnosis present

## 2021-03-11 DIAGNOSIS — Z79899 Other long term (current) drug therapy: Secondary | ICD-10-CM

## 2021-03-11 DIAGNOSIS — S00522A Blister (nonthermal) of oral cavity, initial encounter: Secondary | ICD-10-CM | POA: Diagnosis present

## 2021-03-11 DIAGNOSIS — E871 Hypo-osmolality and hyponatremia: Secondary | ICD-10-CM

## 2021-03-11 DIAGNOSIS — F419 Anxiety disorder, unspecified: Secondary | ICD-10-CM | POA: Diagnosis not present

## 2021-03-11 DIAGNOSIS — C419 Malignant neoplasm of bone and articular cartilage, unspecified: Secondary | ICD-10-CM

## 2021-03-11 DIAGNOSIS — Z20822 Contact with and (suspected) exposure to covid-19: Secondary | ICD-10-CM | POA: Diagnosis present

## 2021-03-11 DIAGNOSIS — C154 Malignant neoplasm of middle third of esophagus: Secondary | ICD-10-CM | POA: Diagnosis present

## 2021-03-11 DIAGNOSIS — D6481 Anemia due to antineoplastic chemotherapy: Secondary | ICD-10-CM

## 2021-03-11 DIAGNOSIS — K140 Glossitis: Secondary | ICD-10-CM | POA: Diagnosis present

## 2021-03-11 DIAGNOSIS — C7951 Secondary malignant neoplasm of bone: Secondary | ICD-10-CM | POA: Diagnosis present

## 2021-03-11 DIAGNOSIS — C414 Malignant neoplasm of pelvic bones, sacrum and coccyx: Secondary | ICD-10-CM

## 2021-03-11 DIAGNOSIS — C159 Malignant neoplasm of esophagus, unspecified: Secondary | ICD-10-CM

## 2021-03-11 DIAGNOSIS — Z800 Family history of malignant neoplasm of digestive organs: Secondary | ICD-10-CM

## 2021-03-11 DIAGNOSIS — G9529 Other cord compression: Secondary | ICD-10-CM | POA: Diagnosis present

## 2021-03-11 DIAGNOSIS — M8458XA Pathological fracture in neoplastic disease, other specified site, initial encounter for fracture: Secondary | ICD-10-CM | POA: Diagnosis present

## 2021-03-11 LAB — RESP PANEL BY RT-PCR (FLU A&B, COVID) ARPGX2
Influenza A by PCR: NEGATIVE
Influenza B by PCR: NEGATIVE
SARS Coronavirus 2 by RT PCR: NEGATIVE

## 2021-03-11 MED ORDER — TAMSULOSIN HCL 0.4 MG PO CAPS
0.4000 mg | ORAL_CAPSULE | Freq: Every day | ORAL | Status: DC
  Administered 2021-03-12 – 2021-03-13 (×2): 0.4 mg via ORAL
  Filled 2021-03-11 (×2): qty 1

## 2021-03-11 MED ORDER — MEGESTROL ACETATE 400 MG/10ML PO SUSP
400.0000 mg | Freq: Every day | ORAL | Status: DC
  Administered 2021-03-11 – 2021-03-12 (×2): 400 mg via ORAL
  Filled 2021-03-11 (×2): qty 10

## 2021-03-11 MED ORDER — POLYETHYLENE GLYCOL 3350 17 G PO PACK
17.0000 g | PACK | Freq: Two times a day (BID) | ORAL | Status: DC
  Administered 2021-03-11 – 2021-03-13 (×4): 17 g via ORAL
  Filled 2021-03-11 (×4): qty 1

## 2021-03-11 MED ORDER — TRAZODONE HCL 50 MG PO TABS
50.0000 mg | ORAL_TABLET | Freq: Every day | ORAL | Status: DC
  Administered 2021-03-11 – 2021-03-12 (×2): 50 mg via ORAL
  Filled 2021-03-11 (×2): qty 1

## 2021-03-11 MED ORDER — MORPHINE SULFATE (CONCENTRATE) 10 MG/0.5ML PO SOLN: 10.0000 mg | ORAL | Status: DC | PRN

## 2021-03-11 MED ORDER — SUCRALFATE 1 G PO TABS
1.0000 g | ORAL_TABLET | Freq: Four times a day (QID) | ORAL | Status: DC
  Administered 2021-03-11 – 2021-03-13 (×8): 1 g via ORAL
  Filled 2021-03-11 (×8): qty 1

## 2021-03-11 MED ORDER — HYDROMORPHONE HCL 2 MG PO TABS
4.0000 mg | ORAL_TABLET | ORAL | Status: DC | PRN
  Administered 2021-03-12 – 2021-03-13 (×7): 4 mg via ORAL
  Filled 2021-03-11 (×7): qty 2

## 2021-03-11 MED ORDER — MAGIC MOUTHWASH W/LIDOCAINE
5.0000 mL | Freq: Three times a day (TID) | ORAL | Status: DC
  Administered 2021-03-11 – 2021-03-13 (×7): 5 mL via ORAL
  Filled 2021-03-11 (×8): qty 5

## 2021-03-11 MED ORDER — DEXAMETHASONE 4 MG PO TABS
4.0000 mg | ORAL_TABLET | Freq: Three times a day (TID) | ORAL | Status: DC
  Administered 2021-03-11 – 2021-03-13 (×7): 4 mg via ORAL
  Filled 2021-03-11 (×7): qty 1

## 2021-03-11 MED ORDER — ACETAMINOPHEN 650 MG RE SUPP: 650.0000 mg | Freq: Four times a day (QID) | RECTAL | Status: DC | PRN

## 2021-03-11 MED ORDER — PROCHLORPERAZINE MALEATE 10 MG PO TABS: 10.0000 mg | ORAL_TABLET | Freq: Four times a day (QID) | ORAL | Status: DC | PRN

## 2021-03-11 MED ORDER — ACETAMINOPHEN 325 MG PO TABS: 650.0000 mg | ORAL_TABLET | Freq: Four times a day (QID) | ORAL | Status: DC | PRN

## 2021-03-11 MED ORDER — GABAPENTIN 300 MG PO CAPS
300.0000 mg | ORAL_CAPSULE | Freq: Three times a day (TID) | ORAL | Status: DC
  Administered 2021-03-11 – 2021-03-13 (×7): 300 mg via ORAL
  Filled 2021-03-11 (×7): qty 1

## 2021-03-11 MED ORDER — ENOXAPARIN SODIUM 40 MG/0.4ML IJ SOSY
40.0000 mg | PREFILLED_SYRINGE | INTRAMUSCULAR | Status: DC
  Administered 2021-03-11 – 2021-03-13 (×3): 40 mg via SUBCUTANEOUS
  Filled 2021-03-11 (×3): qty 0.4

## 2021-03-11 MED ORDER — OXYCODONE HCL ER 10 MG PO T12A
30.0000 mg | EXTENDED_RELEASE_TABLET | Freq: Three times a day (TID) | ORAL | Status: DC
  Administered 2021-03-11 – 2021-03-12 (×4): 30 mg via ORAL
  Filled 2021-03-11 (×4): qty 3

## 2021-03-11 MED ORDER — METHOCARBAMOL 1000 MG/10ML IJ SOLN
500.0000 mg | Freq: Four times a day (QID) | INTRAVENOUS | Status: DC | PRN
  Filled 2021-03-11: qty 5

## 2021-03-11 MED ORDER — PANTOPRAZOLE SODIUM 40 MG PO TBEC
40.0000 mg | DELAYED_RELEASE_TABLET | Freq: Two times a day (BID) | ORAL | Status: DC
  Administered 2021-03-11 – 2021-03-12 (×2): 40 mg via ORAL
  Filled 2021-03-11 (×2): qty 1

## 2021-03-11 MED ORDER — HYDROMORPHONE HCL 2 MG PO TABS
4.0000 mg | ORAL_TABLET | ORAL | Status: DC | PRN
  Administered 2021-03-11: 4 mg via ORAL
  Filled 2021-03-11: qty 2

## 2021-03-11 MED ORDER — HYDROMORPHONE HCL 1 MG/ML IJ SOLN
1.0000 mg | INTRAMUSCULAR | Status: DC | PRN
  Administered 2021-03-11 – 2021-03-12 (×4): 1 mg via INTRAVENOUS
  Filled 2021-03-11 (×4): qty 1

## 2021-03-11 MED ORDER — ONDANSETRON HCL 4 MG PO TABS: 4.0000 mg | ORAL_TABLET | Freq: Four times a day (QID) | ORAL | Status: DC | PRN

## 2021-03-11 MED ORDER — SENNOSIDES-DOCUSATE SODIUM 8.6-50 MG PO TABS
1.0000 | ORAL_TABLET | Freq: Every day | ORAL | Status: DC
  Administered 2021-03-11 – 2021-03-13 (×3): 1 via ORAL
  Filled 2021-03-11 (×3): qty 1

## 2021-03-11 MED ORDER — ONDANSETRON HCL 4 MG/2ML IJ SOLN: 4.0000 mg | Freq: Four times a day (QID) | INTRAMUSCULAR | Status: DC | PRN

## 2021-03-11 NOTE — H&P (Signed)
History and Physical  Jimmy Hawkins CHY:[id number] DOB: [birth year] DOA: 03/11/2021  PCP: [name], MD Patient coming from: Home   I have personally briefly reviewed patient's old medical records in [place]   Chief Complaint:  worsening back pain.   HPI: Jimmy Hawkins is a 46 y.o. male with past medical history significant for alcohol use, metastatic esophageal cancer was on palliative chemotherapy treatment followed by Dr. Burr Medico.  Recent admission on  02/21/2021 for worsening hip pain and lower back pain, lower extremities weakness.  MRI showed a large sacral mets.  He was a started on steroids and radiation treatment.  Palliative care assisted with pain management.  He was discharged home 2/12 with hospice care, and he was going to complete 5 more treatment with radiation as an outpatient.  His last radiation treatment is a scheduled for 2/17.  He went home yesterday and he could not get pain medication until after 6 PM, when he was able to get the pain medication from the pharmacy.  His last dose of pain medication was when he was discharged from hospital at 1 PM.  He had a difficult night with uncontrolled pain.  He went to radiation treatments today, but it was very difficult for him to stand up and and he had severe pain during transportation. He wont be able to tolerate out patient radiation.      Review of Systems: All systems reviewed and apart from history of presenting illness, are negative.  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   Past Surgical History:  Procedure Laterality Date   BIOPSY  08/11/2019   Procedure: BIOPSY;  Surgeon: [name], MD;  Location: [facility] ENDOSCOPY;  Service: Endoscopy;;   BIOPSY  11/28/2019   Procedure: BIOPSY;  Surgeon:  [name], MD;  Location: [facility] ENDOSCOPY;  Service: Gastroenterology;;   ESOPHAGOGASTRODUODENOSCOPY  08/11/2019   ESOPHAGOGASTRODUODENOSCOPY (EGD) WITH PROPOFOL N/A 08/11/2019   Procedure: ESOPHAGOGASTRODUODENOSCOPY (EGD) WITH PROPOFOL;  Surgeon: [name], MD;  Location: [place];  Service: Endoscopy;  Laterality: N/A;   ESOPHAGOGASTRODUODENOSCOPY (EGD) WITH PROPOFOL N/A 11/28/2019   Procedure: ESOPHAGOGASTRODUODENOSCOPY (EGD) WITH PROPOFOL;  Surgeon: Rush Landmark Telford Nab., MD;  Location: [place];  Service: Gastroenterology;  Laterality: N/A;   EUS N/A 11/28/2019   Procedure: UPPER ENDOSCOPIC ULTRASOUND (EUS) RADIAL;  Surgeon: [name], MD;  Location: [place];  Service: Gastroenterology;  Laterality: N/A;   IR IMAGING GUIDED PORT INSERTION  12/07/2020   Social History:  reports that he has never smoked. He has never used smokeless tobacco. He reports current alcohol use of about 30.0 standard drinks per week. He reports that he does not use drugs.   No Known Allergies  Family History  Problem Relation Age of Onset   Esophageal cancer Mother    Stomach cancer Mother    Healthy Father    Colon cancer Neg Hx    Rectal cancer Neg Hx     Prior to Admission medications   Medication Sig Start Date End Date Taking? Authorizing Provider  dexamethasone (DECADRON) 4 MG tablet Take 1 tablet (4 mg total) by mouth 3 (three) times daily. 03/10/21   Montrel Donahoe A, MD  diphenhydrAMINE-APAP, sleep, (TYLENOL PM EXTRA STRENGTH) 50-1000 MG/30ML LIQD Take 15 mLs by mouth at bedtime as needed (  sleep).    [provider]  ferrous sulfate 325 (65 FE) MG tablet TAKE 1 TABLET BY MOUTH 2 TIMES DAILY. Patient taking differently: Take 325 mg by mouth 2 (two) times daily. 05/18/20 05/18/21  [name], MD  gabapentin (NEURONTIN) 300 MG capsule Take 1 capsule (300 mg total) by mouth 3 (three) times daily. 03/08/21   Adel Neyer A, MD  HYDROmorphone  (DILAUDID) 4 MG tablet Take 1-1.5 tablets (4-6 mg total) by mouth every 3 (three) hours as needed for severe pain or moderate pain. 03/08/21   Nykerria Macconnell A, MD  megestrol (MEGACE) 400 MG/10ML suspension Take 10 mLs (400 mg total) by mouth daily. 03/09/21   Reuel Lamadrid A, MD  Morphine Sulfate (MORPHINE CONCENTRATE) 10 MG/0.5ML SOLN concentrated solution Take 0.5-1 mLs (10-20 mg total) by mouth every 2 (two) hours as needed for severe pain, moderate pain or anxiety. 03/10/21   Merci Walthers A, MD  oxyCODONE 30 MG 12 hr tablet Take 1 tablet (30 mg total) by mouth every 8 (eight) hours for 7 days. 03/10/21 03/17/21  Maxyne Derocher A, MD  pantoprazole (PROTONIX) 40 MG tablet Take 1 tablet (40 mg total) by mouth 2 (two) times daily before a meal. 03/08/21   Aayla Marrocco A, MD  polyethylene glycol (MIRALAX / GLYCOLAX) 17 g packet Take 17 g by mouth 2 (two) times daily. 03/08/21   Lawrence Mitch A, MD  prochlorperazine (COMPAZINE) 10 MG tablet Take 1 tablet by mouth every 6 hours as needed for nausea or vomiting. 01/25/21 01/25/22  [name], MD  senna-docusate (SENOKOT S) 8.6-50 MG tablet Take 2 tablet by mouth once a day Patient taking differently: Take 1 tablet by mouth daily. 02/10/21     sucralfate (CARAFATE) 1 g tablet Take 1 tablet by mouth 4 times daily. (Dissolve in 15 mls of water before taking) 03/08/21   Craig Wisnewski, Jerald Kief A, MD  tamsulosin (FLOMAX) 0.4 MG CAPS capsule Take 1 capsule (0.4 mg total) by mouth daily after breakfast. 03/09/21   Clemencia Helzer A, MD  traZODone (DESYREL) 50 MG tablet Take 1 tablet (50 mg total) by mouth at bedtime. 03/08/21   Raimi Guillermo A, MD  omeprazole (PRILOSEC) 40 MG capsule Take 1 capsule (40 mg total) by mouth daily. 09/12/19 11/28/19  [name], MD   Physical Exam: Vitals:   03/11/21 1454 03/11/21 1455 03/11/21 1456 03/11/21 1457  BP:   116/89   Pulse: 83 76 85 81  Resp: _0 Temp:      TempSrc:      SpO2: 100% 98% 100% 100%     General exam: Moderately built and nourished patient, pale.  Head, eyes and ENT: Nontraumatic and normocephalic. Pupils equally reacting to light and accommodation. Oral mucosa moist, two ulcer tongue.  Neck: Supple. No JVD, carotid bruit or thyromegaly. Lymphatics: No lymphadenopathy. Respiratory system: Clear to auscultation. No increased work of breathing. Cardiovascular system: S1 and S2 heard, RRR. No JVD, murmurs, gallops, clicks or pedal edema. Gastrointestinal system: Abdomen is nondistended, soft and nontender. Normal bowel sounds heard. No organomegaly or masses appreciated. Central nervous system: Alert and oriented.      Labs on Admission:  Basic Metabolic Panel: Recent Labs  Lab 03/05/21 0500 03/06/21 0455  NA 131* 130*  K 3.3* 3.7  CL 98 97*  CO2 26 26  GLUCOSE 138* 125*  BUN 15 13  CREATININE 0.44* 0.43*  CALCIUM 8.1* 8.1*   Liver Function Tests: No results for input(s): AST,  ALT, ALKPHOS, BILITOT, PROT, ALBUMIN in the last 168 hours. No results for input(s): LIPASE, AMYLASE in the last 168 hours. No results for input(s): AMMONIA in the last 168 hours. CBC: Recent Labs  Lab 03/05/21 0500  WBC 2.8*  HGB 10.9*  HCT 32.7*  MCV 97.6  PLT 136*   Cardiac Enzymes: No results for input(s): CKTOTAL, CKMB, CKMBINDEX, TROPONINI in the last 168 hours.  BNP (last 3 results) No results for input(s): PROBNP in the last 8760 hours. CBG: No results for input(s): GLUCAP in the last 168 hours.  Radiological Exams on Admission: No results found.    Assessment/Plan Principal Problem:   Cancer associated pain Active Problems:   Esophageal cancer, stage IV (HCC)   Metastatic bone cancer (HCC)   Hyponatremia   Anemia associated with chemotherapy   1-Malignant neoplasm of esophagus, stage IV esophageal cancer metastasis to sacrum:  -CT with large sacral metastatic lesion 8.6x6.1 with extraosseous extension -PET 1/17 with increase in size of metastatic  lesion in L hemisacrum -MRI sacrum with large met with central necrosis, extensive involvement of S1, S2, and S3 segments with lesser involvement of upper S4.  Lesion is more extensive to L of midline, but involves all sacral foramina.  Tumor fills sacral spinal canal and extends up to L5-S1 disc level.  Extends into scaral hiatus on the L.  Pathologic fx at S2.  Grossing of L SI joint with involvement of iliac. -Started radiation Tx 02/26/2021 -Patient presented with uncontrolled pain, unable to tolerate outpatient radiation treatments. -Patient will be admitted to complete radiation treatment until 2/17. -Plan to discharge home with hospice on 2/17 -Resume OxyContin 3 times daily, as needed oral dilaudid, rescue sublingual morphine. -Continue with dexamethasone  2-Hyponatremia: Check labs in the morning. 3-Anemia, related to chemotherapy and malignancy Monitor hemoglobin 4-Tongue Ulcerations. Start Magic Mouth wash.     DVT Prophylaxis: Lovenox Code Status: DNR Family Communication: care discussed with sister who was at  bedside.  Disposition Plan: admit to hospital to complete radiation treatment 2/17.   Time spent: 75 minutes  Elmarie Shiley MD [facility]   03/11/2021, 3:50 PM

## 2021-03-11 NOTE — ED Triage Notes (Signed)
Patient present post discharge yesterday because family could not care for him at home. He has to have radiation treatments for bone mets to the pelvis, but his family is unable to get him to his appointments.

## 2021-03-11 NOTE — Progress Notes (Signed)
Jimmy Hawkins ED - [facility]) hospitalized hospice patient visit.  This is a newly enrolled current [facility] hospice patient with a terminal diagnosis of carcinoma of the esophagus. Patient was transferred to ED following his outpatient radiation treatment at the [facility] for uncontrolled pain. Per Dr. Orpah Melter this is a related hospital admission.   Visited at bedside, patient is awake and alert. Complains of pain, looks uncomfortable. Spoke with wife at bedside. She was able to get all prescription medications last night but states they were unable to get the patient's pain under control and the travel to the [facility] for treatments make pain worse.   VS: 104/81, 76, 82, 13, 99% RA I/O: Not recorded.  Abnormal Labs: None since last admission. No new diagnostics.  IV/PRN medications: Robaxin 55m in D5 IVPB Q6 for muscle spasms, Dilaudid 434mPO @ 1451  Problem list: 1-Malignant neoplasm of esophagus, stage IV esophageal cancer metastasis to sacrum:  -CT with large sacral metastatic lesion 8.6x6.1 with extraosseous extension -PET 1/17 with increase in size of metastatic lesion in L hemisacrum -MRI sacrum with large met with central necrosis, extensive involvement of S1, S2, and S3 segments with lesser involvement of upper S4.  Lesion is more extensive to L of midline, but involves all sacral foramina.  Tumor fills sacral spinal canal and extends up to L5-S1 disc level.  Extends into scaral hiatus on the L.  Pathologic fx at S2.  Grossing of L SI joint with involvement of iliac. -Started radiation Tx 02/26/2021 -Patient presented with uncontrolled pain, unable to tolerate outpatient radiation treatments. -Patient will be admitted to complete radiation treatment until 2/17. -Plan to discharge home with hospice on 2/17 -Resume OxyContin 3 times daily, as needed oral dilaudid, rescue sublingual morphine. -Continue with dexamethasone   2-Hyponatremia: Check labs in  the morning. 3-Anemia, related to chemotherapy and malignancy Monitor hemoglobin 4-Tongue Ulcerations. Start Magic Mouth wash.   Discharge Planning: Return home with hospice when appropriate for discharge.  Family Contact: Spoke with wife at bedside.  IDG: Team updated.  Goals of Care:  DNR. Complete palliative radiation treatments.   A Please do not hesitate to call with questions.   Thank you,   MaFarrel GordonRN, [facility] Hospitaliaison   [phone]

## 2021-03-11 NOTE — TOC Initial Note (Addendum)
Transition of Care [facility]) - Initial/Assessment Note    Patient Details  Name: Jimmy Hawkins MRN: [id number] Date of Birth: 11-28-1975  Transition of [facility]) CM/SW Contact:    Roseanne Kaufman, RN Phone Number: 03/11/2021, 1:54 PM  Clinical Narrative:                 Patient previously discharged on 03/10/21 and presented back to [facility] ED on 03/11/21, family reports they are unable to take care of him at home. Patient needs 5 more days of radiation for mets esophageal cancer (to end 03/15/21).  PCP: Dr. Burr Medico is oncologist (palliative chemo) RNCM received [facility] consult for HHC/DME after further review of chart it seems patient may need assistance with transportation to radiation treatment.   RNCM spoke with patient's family( sister: Verdis Frederickson and daughter) and Sharyn Lull PA at bedside. Per family they were advised on last hospital discharge, if they could not care for patient at home to bring patient back to hospital for admission. EDP will review, for possible admission for pain control and radiation treatment.  Patient has been up with home hospice with [facility] after radiation treatment completed. Per chart review DME: hospital bed & OBT will be ordered via hospice.    No additional TOC needs at this time. TOC will sign off. If additional TOC need arise, please order new TOC consult.         Patient Goals and CMS Choice pain control and radiation treatment      Expected Discharge Plan and Services  Continue medical work up.    Prior Living Arrangements/Services  Home with home hospice   Activities of Daily Living  unknown    Permission Sought/Granted    Emotional Assessment     Admission diagnosis:  pelvic pain Patient Active Problem List   Diagnosis Date Noted   Hyponatremia 03/09/2021   Hypokalemia 42/59/5638   Acute metabolic encephalopathy 75/64/3329   Myoclonic jerking 02/22/2021   Steroid-induced hyperglycemia 02/22/2021   Metastatic cancer  ([place]) 02/21/2021   Anemia 02/21/2021   Lower extremity weakness 02/21/2021   Difficulty urinating 02/21/2021   DNR (do not resuscitate) 11/10/2020   Esophageal stricture 09/19/2020   Drug-induced neutropenia ([place]) 08/10/2020   Cancer related pain 03/22/2020   Bone metastases ([place]) 03/22/2020   Iron deficiency anemia 08/12/2019   Upper GI bleed 08/11/2019   Malignant neoplasm of esophagus ([place])    Hematemesis with nausea 08/10/2019   Syncope, vasovagal 08/10/2019   Acute blood loss anemia 08/10/2019   Alcohol abuse    PCP:  [name], MD Pharmacy:   [place] [address] Phone: [phone] Fax: [place], [place] [address] Phone: [phone] Fax: [place], [place] [facility] [place] [address] [place] [address] Phone: [phone] Fax: [phone]  [id number] - [place], [place] [address] [place] [address] Phone: [phone] Fax: [phone]     Social Determinants of Health (SDOH) Interventions    Readmission Risk Interventions No flowsheet data found.

## 2021-03-11 NOTE — ED Notes (Signed)
Placed pt on a condom cath

## 2021-03-11 NOTE — ED Provider Notes (Signed)
Jimmy Hawkins Provider Note   CSN: [id number] Arrival date & time: 03/11/21  1115     History  Chief Complaint  Patient presents with   Pelvic Pain    Jimmy Hawkins is a 46 y.o. male.  HPI 46 year old male with a history of metastatic esophageal cancer with mets to the sacrum, discharged on 2/11 with plans for completion of radiation to be finished on 2/17 with subsequent discharge to hospice presents to the ER with family at bedside.  History provided by daughter at bedside.  Offered translation services but daughter felt comfortable translating. Patient is followed by Dr. Burr Medico and Dr. Tyrell Antonio w/ oncology. Daughter states that the patient has been in severe pain and was only given half of his pain medications at discharge. She states the pain is so severe he virtually cannot move. Because of this, the family is having difficulty transporting him to his radiation appointments.  Daughter states that they were told by Dr. Tyrell Antonio that if this became a problem, that he could be admitted to the hospital to complete radiation and be discharged to hospice afterwards. They present hoping this is the case since the patient is in such severe pain     Home Medications Prior to Admission medications   Medication Sig Start Date End Date Taking? Authorizing Provider  dexamethasone (DECADRON) 4 MG tablet Take 1 tablet (4 mg total) by mouth 3 (three) times daily. 03/10/21   Regalado, Belkys A, MD  diphenhydrAMINE-APAP, sleep, (TYLENOL PM EXTRA STRENGTH) 50-1000 MG/30ML LIQD Take 15 mLs by mouth at bedtime as needed (sleep).    [provider]  ferrous sulfate 325 (65 FE) MG tablet TAKE 1 TABLET BY MOUTH 2 TIMES DAILY. Patient taking differently: Take 325 mg by mouth 2 (two) times daily. 05/18/20 05/18/21  [name], MD  gabapentin (NEURONTIN) 300 MG capsule Take 1 capsule (300 mg total) by mouth 3 (three) times daily. 03/08/21   Regalado, Belkys A, MD   HYDROmorphone (DILAUDID) 4 MG tablet Take 1-1.5 tablets (4-6 mg total) by mouth every 3 (three) hours as needed for severe pain or moderate pain. 03/08/21   Regalado, Belkys A, MD  megestrol (MEGACE) 400 MG/10ML suspension Take 10 mLs (400 mg total) by mouth daily. 03/09/21   Regalado, Belkys A, MD  Morphine Sulfate (MORPHINE CONCENTRATE) 10 MG/0.5ML SOLN concentrated solution Take 0.5-1 mLs (10-20 mg total) by mouth every 2 (two) hours as needed for severe pain, moderate pain or anxiety. 03/10/21   Regalado, Belkys A, MD  oxyCODONE 30 MG 12 hr tablet Take 1 tablet (30 mg total) by mouth every 8 (eight) hours for 7 days. 03/10/21 03/17/21  Regalado, Belkys A, MD  pantoprazole (PROTONIX) 40 MG tablet Take 1 tablet (40 mg total) by mouth 2 (two) times daily before a meal. 03/08/21   Regalado, Belkys A, MD  polyethylene glycol (MIRALAX / GLYCOLAX) 17 g packet Take 17 g by mouth 2 (two) times daily. 03/08/21   Regalado, Belkys A, MD  prochlorperazine (COMPAZINE) 10 MG tablet Take 1 tablet by mouth every 6 hours as needed for nausea or vomiting. 01/25/21 01/25/22  [name], MD  senna-docusate (SENOKOT S) 8.6-50 MG tablet Take 2 tablet by mouth once a day Patient taking differently: Take 1 tablet by mouth daily. 02/10/21     sucralfate (CARAFATE) 1 g tablet Take 1 tablet by mouth 4 times daily. (Dissolve in 15 mls of water before taking) 03/08/21   Regalado, Cassie Freer, MD  tamsulosin (  FLOMAX) 0.4 MG CAPS capsule Take 1 capsule (0.4 mg total) by mouth daily after breakfast. 03/09/21   Regalado, Belkys A, MD  traZODone (DESYREL) 50 MG tablet Take 1 tablet (50 mg total) by mouth at bedtime. 03/08/21   Regalado, Belkys A, MD  omeprazole (PRILOSEC) 40 MG capsule Take 1 capsule (40 mg total) by mouth daily. 09/12/19 11/28/19  [name], MD      Allergies    Patient has no known allergies.    Review of Systems   Review of Systems Ten systems reviewed and are negative for acute change, except as noted in the HPI.    Physical Exam Updated Vital Signs BP 116/89    Pulse 81    Temp 98.7 F (37.1 C) (Oral)    Resp 13    SpO2 100%  Physical Exam Vitals and nursing note reviewed.  Constitutional:      General: He is not in acute distress.    Appearance: He is well-developed. He is ill-appearing.  HENT:     Head: Normocephalic and atraumatic.  Eyes:     Conjunctiva/sclera: Conjunctivae normal.  Cardiovascular:     Rate and Rhythm: Normal rate and regular rhythm.     Heart sounds: No murmur heard. Pulmonary:     Effort: Pulmonary effort is normal. No respiratory distress.     Breath sounds: Normal breath sounds.  Abdominal:     Palpations: Abdomen is soft.     Tenderness: There is no abdominal tenderness.  Musculoskeletal:        General: No swelling.     Cervical back: Neck supple.  Skin:    General: Skin is warm and dry.     Capillary Refill: Capillary refill takes less than 2 seconds.  Neurological:     General: No focal deficit present.     Mental Status: He is alert and oriented to person, place, and time.  Psychiatric:        Mood and Affect: Mood normal.    ED Results / Procedures / Treatments   Labs (all labs ordered are listed, but only abnormal results are displayed) Labs Reviewed - No data to display  EKG None  Radiology No results found.  Procedures Procedures    Medications Ordered in ED Medications  HYDROmorphone (DILAUDID) tablet 4 mg (4 mg Oral Given 03/11/21 1451)    ED Course/ Medical Decision Making/ A&P                           Medical Decision Making Risk Prescription drug management.  46 year old male presenting to the ER with family, requesting admission for completion of radiation treatment given severe pain and inability to transport to radiation appointments.  On arrival, vitals overall reassuring, afebrile, not tachycardic, tachypneic or hypoxic.  He does not appear to be in acute pain and was sleeping on my entrance to the room.  Physical exam  largely unremarkable.  I consulted Dr. Tammi Klippel with radiation oncology who stated that he would let the team know that the patient will be admitted for further radiation treatment.  Consulted hospitalist for admission. Final Clinical Impression(s) / ED Diagnoses Final diagnoses:  Malignant neoplasm of esophagus, unspecified location [facility])    Rx / DC Orders ED Discharge Orders     None         Lyndel Safe 03/11/21 1502    Valarie Merino, MD 03/12/21 [phone]

## 2021-03-11 NOTE — Progress Notes (Signed)
Patient brought to nursing clinic following his radiation treatment.  He has received 10/14 fractions to his pelvis.  Family reports he was discharged from the hospital on yesterday and was seen by home health nurse.  They report they are unable to care for him at home and was told on discharge that if they have a hard time they should bring the patient to the ER to be admitted.  They state he is in a lot of pain and they cannot get it under control.  Patient was transported to [facility] ER for evaluation.  Nurse Davy Pique received patient.  Gloriajean Dell. Leonie Green, BSN

## 2021-03-12 ENCOUNTER — Ambulatory Visit
Admit: 2021-03-12 | Discharge: 2021-03-12 | Disposition: A | Discharge status: Home / Self Care | Payer: Self-pay | Attending: Radiation Oncology | Admitting: Radiation Oncology

## 2021-03-12 DIAGNOSIS — Z66 Do not resuscitate: Secondary | ICD-10-CM

## 2021-03-12 DIAGNOSIS — G893 Neoplasm related pain (acute) (chronic): Principal | ICD-10-CM

## 2021-03-12 DIAGNOSIS — Z7189 Other specified counseling: Secondary | ICD-10-CM

## 2021-03-12 DIAGNOSIS — C159 Malignant neoplasm of esophagus, unspecified: Secondary | ICD-10-CM

## 2021-03-12 DIAGNOSIS — C419 Malignant neoplasm of bone and articular cartilage, unspecified: Secondary | ICD-10-CM

## 2021-03-12 DIAGNOSIS — C414 Malignant neoplasm of pelvic bones, sacrum and coccyx: Secondary | ICD-10-CM

## 2021-03-12 DIAGNOSIS — Z515 Encounter for palliative care: Secondary | ICD-10-CM

## 2021-03-12 LAB — BASIC METABOLIC PANEL
Anion gap: 6 (ref 5–15)
BUN: 18 mg/dL (ref 6–20)
CO2: 26 mmol/L (ref 22–32)
Calcium: 7.8 mg/dL — ABNORMAL LOW (ref 8.9–10.3)
Chloride: 101 mmol/L (ref 98–111)
Creatinine, Ser: 0.65 mg/dL (ref 0.61–1.24)
GFR, Estimated: >60 mL/min (ref >60)
Glucose, Bld: 146 mg/dL — ABNORMAL HIGH (ref 70–99)
Potassium: 3.7 mmol/L (ref 3.5–5.1)
Sodium: 133 mmol/L — ABNORMAL LOW (ref 135–145)

## 2021-03-12 LAB — CBC
HCT: 34.1 % — ABNORMAL LOW (ref 39.0–52.0)
Hemoglobin: 11 g/dL — ABNORMAL LOW (ref 13.0–17.0)
MCH: 31.5 pg (ref 26.0–34.0)
MCHC: 32.3 g/dL (ref 30.0–36.0)
MCV: 97.7 fL (ref 80.0–100.0)
Platelets: 116 10*3/uL — ABNORMAL LOW (ref 150–400)
RBC: 3.49 MIL/uL — ABNORMAL LOW (ref 4.22–5.81)
RDW: 15 % (ref 11.5–15.5)
WBC: 5.7 10*3/uL (ref 4.0–10.5)
nRBC: 0 % (ref 0.0–0.2)

## 2021-03-12 MED ORDER — PANTOPRAZOLE SODIUM 40 MG PO TBEC
40.0000 mg | DELAYED_RELEASE_TABLET | Freq: Two times a day (BID) | ORAL | Status: DC
  Administered 2021-03-12 – 2021-03-13 (×2): 40 mg via ORAL
  Filled 2021-03-12 (×2): qty 1

## 2021-03-12 MED ORDER — MORPHINE SULFATE (CONCENTRATE) 10 MG/0.5ML PO SOLN: 10.0000 mg | ORAL | Status: DC | PRN

## 2021-03-12 MED ORDER — OXYCODONE HCL ER 40 MG PO T12A
40.0000 mg | EXTENDED_RELEASE_TABLET | Freq: Three times a day (TID) | ORAL | Status: DC
  Administered 2021-03-12 – 2021-03-13 (×3): 40 mg via ORAL
  Filled 2021-03-12 (×3): qty 1

## 2021-03-12 MED ORDER — LORAZEPAM 0.5 MG PO TABS: 0.5000 mg | ORAL_TABLET | ORAL | Status: DC | PRN

## 2021-03-12 NOTE — Progress Notes (Signed)
Progress Note   Patient: Jimmy Hawkins ZOX:[id number] DOB: [birth year] DOA: 03/11/2021     1 DOS: the patient was seen and examined on 03/12/2021   Brief hospital course: Wasim Hurlbut is a 46 y.o. male with past medical history significant for alcohol use, metastatic esophageal cancer was on palliative chemotherapy treatment followed by Dr. Burr Medico.  Recent admission on  02/21/2021 for worsening hip pain and lower back pain, lower extremities weakness.  MRI showed a large sacral mets.  He was a started on steroids and radiation treatment.  Palliative care assisted with pain management.  He was discharged home 2/12 with hospice care, and he was going to complete 5 more treatment with radiation as an outpatient.  His last radiation treatment was  scheduled for 2/17.  He presents from radiation suit for admission for uncontrolled pain and unable to complete out patient radiation Treatments.   Case discussed with Dr Burr Medico 2/14 plan to monitor 1-2 days on oral pain medications and rescue oral morphine to make sure pain is controlled on oral regimen. If he is discharge tomorrow /03/13/2021), plan os not to complete rest of radiation treatment.   Assessment and Plan: * Cancer associated pain- (present on admission) Plan to continue with oxycontin 30 mg TID, oral dilaudid 4-6 mg every 3 hours as needed. Sublingual morphine q 2 hours as needed for pain.   Esophageal cancer, stage IV ([place]) -CT 02/21/2021 with large sacral metastatic lesion 8.6x6.1 with extraosseous extension -PET 1/17 with increase in size of metastatic lesion in L hemisacrum. -MRI sacrum with large met with central necrosis, extensive involvement of S1, S2, and S3 segments with lesser involvement of upper S4.  Lesion is more extensive to L of midline, but involves all sacral foramina.  Tumor fills sacral spinal canal and extends up to L5-S1 disc level.  Extends into scaral hiatus on the L.  Pathologic fx at S2.  Grossing of  L SI joint with involvement of iliac. -Started radiation Tx 02/26/2021 -Patient presented with uncontrolled pain, unable to tolerate outpatient radiation treatments. -Resume OxyContin 3 times daily, as needed oral dilaudid, rescue sublingual morphine. -Continue with dexamethasone. -Discussed with patient and family and Dr Burr Medico. Plan to monitor on oral  pain medication today, if he does ok with oral regime could go home, no need to complete rest of radiation Tx. Completion of Rx treatment wont make significant  Difference.     Anemia associated with chemotherapy Hb stable.   Hyponatremia Sodium level stable.    Tongue blister. Continue with magic mouth wash.      Subjective:  He just came from radiation and is having severe pain currently, he is afraid of going home and not have good pain controlled.   Still having pain in his tongue, blister.   Physical Exam: Vitals:   03/12/21 0150 03/12/21 0547 03/12/21 1053 03/12/21 1334  BP: _0 100/63  Pulse: 81 92 92 98  Resp: _1 Temp: 98.1 F (36.7 C) 98.1 F (36.7 C) 98.1 F (36.7 C) 98.6 F (37 C)  TempSrc: Oral Oral Oral Oral  SpO2: 97% 99%  100%  Weight:   69.1 kg   Height:   6' (1.829 m)    General; NAD Lungs; CTA Abdomen; soft nt  Data Reviewed:  CBC and Bmet reviewed.   Family Communication: care discussed with sister who was at bedside.   Disposition: Status is: Inpatient Remains inpatient appropriate because: for pain management, home in 1 or  2 days if pain is controlled. He will be discharge home with hospice.           Planned Discharge Destination: Home     Time spent: 45 minutes  Author: Elmarie Shiley, MD 03/12/2021 1:52 PM  For on call review [email or web address].

## 2021-03-12 NOTE — Consult Note (Addendum)
Palliative Care Consult Note                                  Date: 03/12/2021   Patient Name: Jimmy Hawkins  DOB: 08/10/1975  MRN: [id number]  Age / Sex: 45 y.o., male  PCP: [name], MD Referring Physician: Elmarie Shiley, MD  Reason for Consultation: Establishing goals of care, Non pain symptom management, Pain control, and Withdrawal of life-sustaining treatment  HPI/Patient Profile: Palliative Care consult requested for goals of care discussion and symptom management in this 46 y.o. male  with past medical history of metastatic esophageal cancer with disease progression s/p chemoradiation. He was recently discharged on 03/10/2021 after being hospitalized for pain control and radiation treatments. He was admitted on 03/11/2021 after receiving his outpatient radiation treatment and expressing uncontrolled pain.    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 Jarrett Soho, RN, Bevely Palmer, RN [facility] Liaison), and Cristela Blue with [facility] also present during visit.    I created space and opportunity for patient and family to discuss how patient tolerated being home for the first 24 hours acknowledging any barriers.  Patient expressed he gains better relief with IV pain medication and has some anxiety about being at home and not having his pain controlled.  Family also endorses anxiousness and fear of inability to keep patient comfortable and administer medications.  We reviewed  at length patient's current pain regimen.  Family had medication sheet readily available to review during our discussions in regards to frequency and dosing of each medication.  Of note patient did go a length of time without any medication which could have possibly escalated his pain.  Education provided on pain regimen and medication administration around-the-clock to aid in patient's comfort in the home.  We discussed patient's current radiation treatments.  He has received radiation on today with 3 scheduled treatments left.  Patient and family really wants to be at home for what time he has lived but also acknowledges difficulty in patient coming back and forth.  We discussed at length reasoning behind radiation and given patient has received 90% of his treatments foregoing the last 3 treatments would not have a significant impact on his overall prognosis.  Family verbalized understanding expressing agreement to forego last treatment in efforts to get him home.  We are hopeful by patient not having to come back and forth this will allow him ongoing comfort in his home with limited need for transport or movement outside of the home.  I created space for family to discuss their feelings of patient being in the home, any barriers, warranting what steps can be taken to ensure that her comfort while also supporting their wishes for him to be in the home and to be comfortable.  Wife and family expresses their are now aware of medication regimen and will adhere to this once he returns home.  They are in agreement with patient discharging if approved by attending.  [facility]'s  hospital Liaisons at the bedside offering support and education regarding their and home services.  Family is assured they are available 24/7 and will assist in medication adjustments if patient's pain is not controlled.  They will make all efforts in the home to keep him comfortable and if this is not able to be obtained would then  consider options of transferring to residential hospice facility for ongoing support.  Family verbalized understanding and appreciation.  Patient and family clear and expressed goals to discontinue final radiation treatments, focus on patient's comfort and pain management, with hopes of him returning home with ongoing hospice support.  They verbalized clear understanding of hospice's role and expectations including prognosis.  All questions answered and support provided.  The family was encouraged to call with questions or concerns.  PMT will continue to support holistically as needed.  Objective:   Primary Diagnoses: Present on Admission:  Cancer associated pain   Scheduled Meds:  dexamethasone  4 mg Oral TID   enoxaparin (LOVENOX) injection  40 mg Subcutaneous Q24H   gabapentin  300 mg Oral TID   magic mouthwash w/lidocaine  5 mL Oral TID   oxyCODONE  30 mg Oral Q8H   pantoprazole  40 mg Oral BID   polyethylene glycol  17 g Oral BID   senna-docusate  1 tablet Oral Daily   sucralfate  1 g Oral QID   tamsulosin  0.4 mg Oral QPC breakfast   traZODone  50 mg Oral QHS    Continuous Infusions:  methocarbamol (ROBAXIN) IV      PRN Meds: acetaminophen **OR** acetaminophen, HYDROmorphone (DILAUDID) injection, HYDROmorphone, LORazepam, methocarbamol (ROBAXIN) IV, morphine CONCENTRATE, ondansetron **OR** ondansetron (ZOFRAN) IV, prochlorperazine  No Known Allergies  Review of Systems  Musculoskeletal:  Positive for arthralgias.  Neurological:  Positive for weakness.  Psychiatric/Behavioral:  The patient is nervous/anxious.   Unless otherwise noted, a complete review of systems is negative.  Physical Exam General: NAD, appears comfortable, resting in bed on cell phone Cardiovascular: regular rate and rhythm Pulmonary: diminished bilaterally  Extremities: no edema, no joint deformities Skin: no rashes, warm and dry Neurological: AAOx4 with interpretation  Vital Signs:  BP  94/64    Pulse 92    Temp 98.1 F (36.7 C) (Oral)    Resp 16    Ht 6' (1.829 m)    Wt 69.1 kg    SpO2 99%    BMI 20.67 kg/m  Pain Scale: 0-10   Pain Score: 4   SpO2: SpO2: 99 % O2 Device:SpO2: 99 % O2 Flow Rate: .   IO: Intake/output summary:  Intake/Output Summary (Last 24 hours) at 03/12/2021 1134 Last data filed at 03/12/2021 0015 Gross per 24 hour  Intake 120 ml  Output --  Net 120 ml    LBM: Last BM Date :  (PTA) Baseline Weight: Weight: 69.1 kg Most recent weight: Weight: 69.1 kg      Palliative Assessment/Data: PPS 20-30%   Advanced Care Planning:   Primary Decision Maker: PATIENT  Code Status/Advance Care Planning: DNR   Assessment & Plan:   SUMMARY OF RECOMMENDATIONS   DNR/DNI  Continue with current plan of care with a goal of discharging patient back home with ongoing hospice support.  Patient and family clear and expressed goals to forego last 3 radiation treatments with the understanding that discontinuing will not hasten his death and continuing would not provide any additional meaningful improvement. Patient to discharge home via EMS and continue with hospice support via [facility].  Ongoing symptom management needs PMT will continue to support and follow as needed. Please call team line with urgent needs.  Symptom Management:  Pain, cancer related:  OxyContin 40 mg 3 times daily (increased from 30 mg).   Gabapentin 300 mg 3 times daily  IV dilaudid 31m every 3hours as needed for breakthrough pain (patient has received 4 mg over past 24 hours) Dilaudid 4-6 mg every 3 hours as needed for breakthrough pain (has received 3 doses of 436mover past 24 hours)  Roxanol 10-20 mg every hour as needed for breakthrough pain in addition to oral Dilaudid in the setting of hospice patient.  (Has not received any doses since admission)    Constipation MiraLAX 2 times daily Senna S daily Anxiety Ativan 0.5-1 mg every 4 hours as needed for anxiety  Palliative  Prophylaxis:  Bowel Regimen and Frequent Pain Assessment  Additional Recommendations (Limitations, Scope, Preferences): Full Comfort Care  Psycho-social/Spiritual:  Desire for further Chaplaincy support: no Additional Recommendations: Education on Hospice  Prognosis:  < 6 months  Discharge Planning:  Home with Hospice   Discussed with: Dr. ReTyrell Antoniond [facility]'s hospital team.  Patient and family expressed understanding and was in agreement with this plan.   Time Total: 55 min.   Visit consisted of counseling and education dealing with the complex and emotionally intense issues of symptom management and palliative care in the setting of serious and potentially life-threatening illness.Greater than 50%  of this time was spent counseling and coordinating care related to the above assessment and plan.  Signed by:  NiAlda LeaAGPCNP-BC Palliative Medicine Team  Phone: [phone]: [phone]: N.Bjorn Pippin Thank you for allowing the Palliative Medicine Team to assist in the care of this patient. Please utilize secure chat with additional questions, if there is no response within 30 minutes please call the above phone number. Palliative Medicine Team providers are available by phone from 7am to 5pm daily and can be reached through the team cell phone.  Should this patient require assistance outside of these hours, please call the patient's attending physician.

## 2021-03-12 NOTE — Assessment & Plan Note (Signed)
Plan to continue with oxycontin 30 mg TID, oral dilaudid 4-6 mg every 3 hours as needed. Sublingual morphine q 2 hours as needed for pain.

## 2021-03-12 NOTE — Progress Notes (Signed)
Jimmy Hawkins   DOB:April 22, 1975   TK#:[id number]   DJM#:[id number]  Oncology follow up   Subjective: Patient was discharged home 2 days ago.  He returned to our cancer center for outpatient radiation yesterday, and experienced a lot of pain, and was not able to walk.  He was sent to ED and was admitted for pain control again.  He appears to be comfortable when I saw him, his sister and niece were in the room with him.   Objective:  Vitals:   03/12/21 1053 03/12/21 1334  BP: 94/64 100/63  Pulse: 92 98  Resp: 16 14  Temp: 98.1 F (36.7 C) 98.6 F (37 C)  SpO2:  100%    Body mass index is 20.67 kg/m.  Intake/Output Summary (Last 24 hours) at 03/12/2021 1435 Last data filed at 03/12/2021 1348 Gross per 24 hour  Intake 300 ml  Output --  Net 300 ml     Sclerae unicteric  No leg edema, he is able to sit up and get out of bed independently    CBG (last 3)  No results for input(s): GLUCAP in the last 72 hours.    Labs:  Urine Studies No results for input(s): UHGB, CRYS in the last 72 hours.  Invalid input(s): UACOL, UAPR, USPG, UPH, UTP, UGL, UKET, UBIL, UNIT, UROB, ULEU, UEPI, UWBC, URBC, UBAC, CAST, UCOM, BILUA  Basic Metabolic Panel: Recent Labs  Lab 03/06/21 0455 03/12/21 0521  NA 130* 133*  K 3.7 3.7  CL 97* 101  CO2 26 26  GLUCOSE 125* 146*  BUN 13 18  CREATININE 0.43* 0.65  CALCIUM 8.1* 7.8*   GFR Estimated Creatinine Clearance: 116.4 mL/min (by C-G formula based on SCr of 0.65 mg/dL). Liver Function Tests: No results for input(s): AST, ALT, ALKPHOS, BILITOT, PROT, ALBUMIN in the last 168 hours.  No results for input(s): LIPASE, AMYLASE in the last 168 hours. No results for input(s): AMMONIA in the last 168 hours.  Coagulation profile No results for input(s): INR, PROTIME in the last 168 hours.  CBC: Recent Labs  Lab 03/12/21 0521  WBC 5.7  HGB 11.0*  HCT 34.1*  MCV 97.7  PLT 116*   Cardiac Enzymes: No results for input(s): CKTOTAL,  CKMB, CKMBINDEX, TROPONINI in the last 168 hours. BNP: Invalid input(s): POCBNP CBG: No results for input(s): GLUCAP in the last 168 hours.  D-Dimer No results for input(s): DDIMER in the last 72 hours. Hgb A1c No results for input(s): HGBA1C in the last 72 hours.  Lipid Profile No results for input(s): CHOL, HDL, LDLCALC, TRIG, CHOLHDL, LDLDIRECT in the last 72 hours. Thyroid function studies No results for input(s): TSH, T4TOTAL, T3FREE, THYROIDAB in the last 72 hours.  Invalid input(s): FREET3  Anemia work up No results for input(s): VITAMINB12, FOLATE, FERRITIN, TIBC, IRON, RETICCTPCT in the last 72 hours.  Microbiology Recent Results (from the past 240 hour(s))  Resp Panel by RT-PCR (Flu A&B, Covid) Nasopharyngeal Swab     Status: None   Collection Time: 03/11/21  4:30 PM   Specimen: Nasopharyngeal Swab; Nasopharyngeal(NP) swabs in vial transport medium  Result Value Ref Range Status   SARS Coronavirus 2 by RT PCR NEGATIVE NEGATIVE Final    Comment: (NOTE) SARS-CoV-2 target nucleic acids are NOT DETECTED.  The SARS-CoV-2 RNA is generally detectable in upper respiratory specimens during the acute phase of infection. The lowest concentration of SARS-CoV-2 viral copies this assay can detect is 138 copies/mL. A negative result does not preclude SARS-Cov-2 infection and  should not be used as the sole basis for treatment or other patient management decisions. A negative result may occur with  improper specimen collection/handling, submission of specimen other than nasopharyngeal swab, presence of viral mutation(s) within the areas targeted by this assay, and inadequate number of viral copies(<138 copies/mL). A negative result must be combined with clinical observations, patient history, and epidemiological information. The expected result is Negative.  Fact Sheet for Patients:  EntrepreneurPulse.com.au  Fact Sheet for Healthcare Providers:   IncredibleEmployment.be  This test is no t yet approved or cleared by the [place] FDA and  has been authorized for detection and/or diagnosis of SARS-CoV-2 by FDA under an Emergency Use Authorization (EUA). This EUA will remain  in effect (meaning this test can be used) for the duration of the COVID-19 declaration under Section 564(b)(1) of the Act, 21 U.S.C.section 360bbb-3(b)(1), unless the authorization is terminated  or revoked sooner.       Influenza A by PCR NEGATIVE NEGATIVE Final   Influenza B by PCR NEGATIVE NEGATIVE Final    Comment: (NOTE) The Xpert Xpress SARS-CoV-2/FLU/RSV plus assay is intended as an aid in the diagnosis of influenza from Nasopharyngeal swab specimens and should not be used as a sole basis for treatment. Nasal washings and aspirates are unacceptable for Xpert Xpress SARS-CoV-2/FLU/RSV testing.  Fact Sheet for Patients: EntrepreneurPulse.com.au  Fact Sheet for Healthcare Providers: IncredibleEmployment.be  This test is not yet approved or cleared by the [place] FDA and has been authorized for detection and/or diagnosis of SARS-CoV-2 by FDA under an Emergency Use Authorization (EUA). This EUA will remain in effect (meaning this test can be used) for the duration of the COVID-19 declaration under Section 564(b)(1) of the Act, 21 U.S.C. section 360bbb-3(b)(1), unless the authorization is terminated or revoked.  Performed at [facility], [place] [address] [place], [place] [address]        Studies:  No results found.  Assessment: 46 y.o. 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  History of  heavy alcohol user 5.  Altered mental status and jerking, possibly related to narcotics, improved  6. DNR    Plan:  -Pt and his family have agreed with hospice, their goal is still to go home with hospice, however patient is very anxious that he may experience intractable pain again at home.  Appreciate palliative care practitioner [phone] input, will let him try sublingual morphine, to see if that is adequate for his breakthrough pain.  I will discuss with Lexine Baton also. -I discussed the benefit of last 2 to 3 days radiation is very small, if he has pain is controlled in the next 1 to 2 days, he can go home with hospice the last few doses of radiation.  But it is reasonable to continue radiation while he is in the hospital. -We also discussed the option of residential hospice if his pain is not able to be controlled, he is open to that. -I will follow-up as needed.  I spoke with Dr. Tyrell Antonio this morning.   [name], MD 03/12/2021  2:35 PM

## 2021-03-12 NOTE — Assessment & Plan Note (Signed)
Hb stable.  ° ° °

## 2021-03-12 NOTE — Assessment & Plan Note (Signed)
Sodium level stable.

## 2021-03-12 NOTE — Hospital Course (Signed)
Jimmy Hawkins is a 46 y.o. male with past medical history significant for alcohol use, metastatic esophageal cancer was on palliative chemotherapy treatment followed by Dr. Burr Medico.  Recent admission on  02/21/2021 for worsening hip pain and lower back pain, lower extremities weakness.  MRI showed a large sacral mets.  He was a started on steroids and radiation treatment.  Palliative care assisted with pain management.  He was discharged home 2/12 with hospice care, and he was going to complete 5 more treatment with radiation as an outpatient.  His last radiation treatment was  scheduled for 2/17.  He presents from radiation suit for admission for uncontrolled pain and unable to complete out patient radiation Treatments.   Case discussed with Dr Burr Medico 2/14 plan to monitor 1-2 days on oral pain medications and rescue oral morphine to make sure pain is controlled on oral regimen. If he is discharge tomorrow /03/13/2021), plan os not to complete rest of radiation treatment.

## 2021-03-12 NOTE — Progress Notes (Signed)
[facility] - [facility]) hospitalized hospice patient visit.   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 Patient was transferred to ED following his outpatient radiation treatment at the [facility] for uncontrolled pain. Per Dr. Orpah Melter this is a related hospital admission.    Visited at bedside with family present and on phone. Pickenpack-Cousar, Carlena Sax, NP from the [facility] was also present along with Marla Minoso with [facility] to assist with translation.  Patient was alert and oriented, resting in bed and appeared to be in no distress.  We discussed plan to return home with hospice support, medications and symptom management. Patient and family agreed to discontinue any further radiation treatments.   Patient continues to be appropriate for inpatient care due to need for pain and symptom management.   VS: 98.1, 94/64, 92, 16, 100% RA I/O: 120/no recorded.  Abnormal labs: 03/12/21 05:21 Sodium: 133 (L) Glucose: 146 (H) Calcium: 7.8 (L) RBC: 3.49 (L) Hemoglobin: 11.0 (L) HCT: 34.1 (L) Platelets: 116 (L)  IV/PRN medications: Robaxin 549m in D5 IVPB Q6 for muscle spasms, Dilaudid 133mIV'@0520' , 0948. 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 Sublingual morphine q 2 hours as needed for pain.    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 -MRI sacrum with large met with central necrosis, extensive involvement of S1, S2, and S3 segments with lesser involvement of upper S4.  Lesion is more extensive to L of midline, but involves all sacral foramina.  Tumor fills sacral spinal canal and extends up  to L5-S1 disc level.  Extends into scaral hiatus on the L.  Pathologic fx at S2.  Grossing of L SI joint with involvement of iliac. -Started radiation Tx 02/26/2021 -Patient presented with uncontrolled pain, unable to tolerate outpatient radiation treatments. -Resume OxyContin 3 times daily, as needed oral dilaudid, rescue sublingual morphine. -Continue with dexamethasone. -Discussed with patient and family and Dr FenBurr Medicolan to monitor on oral  pain medication today, if he does ok with oral regime could go home, no need to complete rest of radiation Tx. Completion of Rx treatment wont make significant  Difference.    Anemia associated with chemotherapy Hb stable.    Hyponatremia Sodium level stable.    Tongue blister. Continue with magic mouth wash.   Oncology note:  Plan:  -Pt and his family have agreed with hospice, their goal is still to go home with hospice, however patient is very anxious that he may experience intractable pain again at home.  Appreciate palliative care practitioner [phone], will let him try sublingual morphine, to see if that is adequate for his breakthrough pain.  I will discuss with NikLexine Batonso. -I discussed the benefit of last 2 to 3 days radiation is very small, if he has pain is controlled in the next 1 to 2 days, he can go home with hospice the last few doses of radiation.  But it is reasonable to continue radiation while he is in the hospital. -We also discussed the option of residential hospice if his pain is not able to be controlled, he is open to that. -I will follow-up as needed.  I spoke with Dr. Tyrell Antonio this morning. [name], MD 03/12/2021  2:35 PM   Discharge Planning: Return home with hospice when appropriate for discharge.   Family Contact: Spoke with family at bedside.   IDG: Team updated.   Goals of Care:  DNR. Return home with hospice once pain is managed. Continue palliative radiation while in the hospital but discontinue once discharged.    Transfer Summary and Medication list placed on shadow chart.  Please use [facility] for ambulance transport at discharge as this service is contracted for [facility] active hospice patients.   A Please do not hesitate to call with questions.   Thank you,   Farrel Gordon, RN, [facility] Liaison   [phone]

## 2021-03-12 NOTE — Assessment & Plan Note (Signed)
-  CT 02/21/2021 with large sacralmetastatic lesion 8.6x6.1 with extraosseous extension -PET 1/17 with increase in size of metastatic lesion in L hemisacrum. -MRI sacrum with large met with central necrosis, extensive involvement of S1, S2, and S3 segments with lesser involvement of upper S4. Lesion is more extensive to L of midline, but involves all sacral foramina. Tumor fills sacral spinal canal and extends up to L5-S1 disc level. Extends into scaral hiatus on the L. Pathologic fx at S2. Grossing of L SI joint with involvement of iliac. -Started radiation Tx 02/26/2021 -Patient presented with uncontrolled pain, unable to tolerate outpatient radiation treatments. -Resume OxyContin 3 times daily, as needed oral dilaudid, rescue sublingual morphine. -Continue with dexamethasone. -Discussed with patient and family and Dr Burr Medico. Plan to monitor on oral  pain medication today, if he does ok with oral regime could go home, no need to complete rest of radiation Tx. Completion of Rx treatment wont make significant  Difference.

## 2021-03-13 ENCOUNTER — Ambulatory Visit
Admit: 2021-03-13 | Discharge: 2021-03-13 | Disposition: A | Discharge status: Home / Self Care | Payer: Self-pay | Attending: Radiation Oncology | Admitting: Radiation Oncology

## 2021-03-13 ENCOUNTER — Encounter: Payer: Self-pay | Admitting: Radiation Oncology

## 2021-03-13 MED ORDER — LIDOCAINE VISCOUS HCL 2 % MT SOLN: 15.0000 mL | Freq: Four times a day (QID) | OROMUCOSAL | 0 refills | Status: AC | PRN

## 2021-03-13 MED ORDER — LORAZEPAM 0.5 MG PO TABS
0.5000 mg | ORAL_TABLET | ORAL | 0 refills | Status: AC | PRN
Start: 2021-03-13 — End: ?

## 2021-03-13 MED ORDER — HEPARIN SOD (PORK) LOCK FLUSH 100 UNIT/ML IV SOLN
500.0000 [IU] | Freq: Once | INTRAVENOUS | Status: AC
  Administered 2021-03-13: 500 [IU] via INTRAVENOUS
  Filled 2021-03-13: qty 5

## 2021-03-13 MED ORDER — OXYCODONE HCL ER 40 MG PO T12A: 40.0000 mg | EXTENDED_RELEASE_TABLET | Freq: Three times a day (TID) | ORAL | 0 refills | Status: AC

## 2021-03-13 MED ORDER — NYSTATIN 100000 UNIT/ML MT SUSP: 5.0000 mL | Freq: Four times a day (QID) | OROMUCOSAL | 0 refills | Status: AC

## 2021-03-13 NOTE — Progress Notes (Signed)
[facility] - [facility]) hospitalized hospice patient visit.   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 Patient was transferred to ED following his outpatient radiation treatment at the [facility] for uncontrolled pain. Per Dr. Orpah Melter this is a related hospital admission.    Visited at bedside with family present .  Plan is for patient to discharge later today. Patient appears comfortable. Answered family questions regarding medications and care.  Patient continues to be appropriate for inpatient care due to need for pain and symptom management.   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 Sublingual morphine q 2 hours as needed for pain.    Anemia associated with chemotherapy Hb stable.    Hyponatremia Sodium level stable.    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 -MRI sacrum with large met with central necrosis, extensive involvement of S1, S2, and S3 segments with lesser involvement of upper S4.  Lesion is more extensive to L of midline, but involves all sacral foramina.  Tumor fills sacral spinal canal and extends up to L5-S1 disc level.  Extends into scaral hiatus on the L.  Pathologic fx at S2.  Grossing of L SI joint with involvement of iliac. -Started radiation Tx 02/26/2021 -Patient presented with uncontrolled pain, unable to tolerate outpatient radiation treatments. -Resume OxyContin 3 times daily, as needed oral dilaudid, rescue sublingual morphine. -Continue  with dexamethasone. -pain is better controlled, patient and family desired  to go home with home hospice today, seen by palliative care before discharge, resume home hospice  Discharge Planning: Return home with hospice later today.  Family Contact: Spoke with family at bedside.   IDG: Team updated.   Goals of Care:  DNR. Return home with hospice once pain is managed. Continue palliative radiation while in the hospital but discontinue once discharged.    [facility] for [facility] at [facility].   A Please do not hesitate to call with questions.   Thank you,   MFarrel Gordon RN, [facility] Hospital[facility]   [phone]

## 2021-03-13 NOTE — Discharge Summary (Signed)
Physician Discharge Summary   Patient: Jimmy Hawkins MRN: [id number] DOB: 01-25-1976  Admit date:     03/11/2021  Discharge date: 03/13/21  Discharge Physician: Florencia Reasons   PCP: [name], MD   Recommendations at discharge:    To home with home hospice   Discharge Diagnoses: Principal Problem:   Cancer associated pain Active Problems:   Esophageal cancer, stage IV (HCC)   Metastatic bone cancer (HCC)   Hyponatremia   Anemia associated with chemotherapy  Resolved Problems:   Cancer of pelvic bones, sacrum, and coccyx [facility])   Hospital Course: Jimmy Hawkins is a 46 y.o. male with past medical history significant for alcohol use, metastatic esophageal cancer was on palliative chemotherapy treatment followed by Dr. Burr Medico.  Recent admission on  02/21/2021 for worsening hip pain and lower back pain, lower extremities weakness.  MRI showed a large sacral mets.  He was a started on steroids and radiation treatment.  Palliative care assisted with pain management.  He was discharged home 2/12 with hospice care, and he was going to complete 5 more treatment with radiation as an outpatient.  His last radiation treatment was  scheduled for 2/17.  He presents from radiation suit for admission for uncontrolled pain and unable to complete out patient radiation Treatments.   Case discussed with Dr Burr Medico 2/14 plan to monitor 1-2 days on oral pain medications and rescue oral morphine to make sure pain is controlled on oral regimen. If he is discharge tomorrow /03/13/2021), plan os not to complete rest of radiation treatment.   Assessment and Plan: * Cancer associated pain- (present on admission) Plan to continue with oxycontin 30 mg TID, oral dilaudid 4-6 mg every 3 hours as needed. Sublingual morphine q 2 hours as needed for pain.   Anemia associated with chemotherapy Hb stable.   Hyponatremia Sodium level stable.   Esophageal cancer, stage IV ([place]) -CT 02/21/2021 with large  sacral metastatic lesion 8.6x6.1 with extraosseous extension -PET 1/17 with increase in size of metastatic lesion in L hemisacrum. -MRI sacrum with large met with central necrosis, extensive involvement of S1, S2, and S3 segments with lesser involvement of upper S4.  Lesion is more extensive to L of midline, but involves all sacral foramina.  Tumor fills sacral spinal canal and extends up to L5-S1 disc level.  Extends into scaral hiatus on the L.  Pathologic fx at S2.  Grossing of L SI joint with involvement of iliac. -Started radiation Tx 02/26/2021 -Patient presented with uncontrolled pain, unable to tolerate outpatient radiation treatments. -Resume OxyContin 3 times daily, as needed oral dilaudid, rescue sublingual morphine. -Continue with dexamethasone. -pain is better controlled, patient and family desired  to go home with home hospice today, seen by palliative care before discharge, resume home hospice .              Diet recommendation:  Discharge Diet Orders (From admission, onward)     Start     Ordered   03/13/21 0000  Diet general        03/13/21 1024             DISCHARGE MEDICATION: Allergies as of 03/13/2021   No Known Allergies      Medication List     TAKE these medications    dexamethasone 4 MG tablet Commonly known as: DECADRON Take 1 tablet (4 mg total) by mouth 3 (three) times daily.   ferrous sulfate 325 (65 FE) MG tablet TAKE 1 TABLET BY MOUTH 2 TIMES  DAILY. What changed: how much to take   gabapentin 300 MG capsule Commonly known as: NEURONTIN Take 1 capsule (300 mg total) by mouth 3 (three) times daily.   HYDROmorphone 4 MG tablet Commonly known as: DILAUDID Take 1-1.5 tablets (4-6 mg total) by mouth every 3 (three) hours as needed for severe pain or moderate pain.   LORazepam 0.5 MG tablet Commonly known as: ATIVAN Take 1-2 tablets (0.5-1 mg total) by mouth every 4 (four) hours as needed for anxiety.   megestrol 400 MG/10ML  suspension Commonly known as: MEGACE Take 10 mLs (400 mg total) by mouth daily.   morphine CONCENTRATE 10 MG/0.5ML Soln concentrated solution Take 0.5-1 mLs (10-20 mg total) by mouth every 2 (two) hours as needed for severe pain, moderate pain or anxiety.   oxyCODONE 30 MG 12 hr tablet Take 1 tablet (30 mg total) by mouth every 8 (eight) hours for 7 days.   pantoprazole 40 MG tablet Commonly known as: PROTONIX Take 1 tablet (40 mg total) by mouth 2 (two) times daily before a meal.   polyethylene glycol 17 g packet Commonly known as: MIRALAX / GLYCOLAX Take 17 g by mouth 2 (two) times daily.   prochlorperazine 10 MG tablet Commonly known as: COMPAZINE Take 1 tablet by mouth every 6 hours as needed for nausea or vomiting.   senna-docusate 8.6-50 MG tablet Commonly known as: Senokot S Take 2 tablet by mouth once a day What changed:  how much to take how to take this when to take this   sucralfate 1 g tablet Commonly known as: Carafate Take 1 tablet by mouth 4 times daily. (Dissolve in 15 mls of water before taking)   tamsulosin 0.4 MG Caps capsule Commonly known as: FLOMAX Take 1 capsule (0.4 mg total) by mouth daily after breakfast.   traZODone 50 MG tablet Commonly known as: DESYREL Take 1 tablet (50 mg total) by mouth at bedtime.   Tylenol PM Extra Strength 50-1000 MG/30ML Liqd Generic drug: diphenhydrAMINE-APAP (sleep) Take 15 mLs by mouth at bedtime as needed (sleep).         Discharge Exam: Filed Weights   03/12/21 1053  Weight: 69.1 kg   Not in acute distress On room air  Condition at discharge: stable  The results of significant diagnostics from this hospitalization (including imaging, microbiology, ancillary and laboratory) are listed below for reference.   Imaging Studies: CT ABDOMEN PELVIS W CONTRAST  Result Date: 02/21/2021 CLINICAL DATA:  Acute abdominal pain. Patient reports right left hip pain. History of esophageal cancer with bone  metastasis. EXAM: CT ABDOMEN AND PELVIS WITH CONTRAST TECHNIQUE: Multidetector CT imaging of the abdomen and pelvis was performed using the standard protocol following bolus administration of intravenous contrast. RADIATION DOSE REDUCTION: This exam was performed according to the departmental dose-optimization program which includes automated exposure control, adjustment of the mA and/or kV according to patient size and/or use of iterative reconstruction technique. CONTRAST:  139m OMNIPAQUE IOHEXOL 300 MG/ML  SOLN COMPARISON:  PET CT 9 days ago FINDINGS: Lower chest: Similar distal esophageal wall thickening to prior. No basilar airspace disease, pleural effusion, or nodule. The heart is normal in size. Hepatobiliary: 13 mm low-density lesion in the inferior right lobe of the liver, series 2, image 39. No other liver lesions. Small gallstones without cholecystitis. No biliary dilatation. Pancreas: No ductal dilatation or inflammation. Spleen: Normal in size without focal abnormality. Splenules anterior and medially. Adrenals/Urinary Tract: Normal adrenal glands. No hydronephrosis or perinephric edema. Homogeneous renal  enhancement with symmetric excretion on delayed phase imaging. Tiny hypodensity in the right kidney is too small to characterize but likely small cyst. No renal calculi. Urinary bladder is physiologically distended without wall thickening. Stomach/Bowel: Stomach physiologically distended. There is no small bowel obstruction or inflammation. Normal appendix. Moderate volume of colonic stool without colonic wall thickening. Sigmoid colon is redundant. Left sacral mass approaches the left aspect of the sigmoid colon, however no direct continuity, fat plane is demonstrated. Vascular/Lymphatic: Normal caliber abdominal aorta. Patent portal and splenic veins. Scattered small retroperitoneal nodes are not enlarged by size criteria. There is no pelvic adenopathy. Reproductive: Prostate is unremarkable.  Other: Presacral soft tissue edema, large sacral lesion is described below. Minimal fat in the inguinal canals. Small fat containing umbilical hernia. No ascites or free air. Musculoskeletal: Large sacral metastatic lesion is again seen. This measures at least 8.6 x 6.1 cm and is centered in the left sacrum, crosses the midline to involve the right aspect of the sacrum. There is extraosseous extension extending in the anterior presacral space. This lesion crosses the left sacroiliac joint and involves the iliac bone. No evidence of new or additional osseous lesions. IMPRESSION: 1. Large sacral metastatic lesion measuring at least 8.6 x 6.1 cm with extraosseous extension. This lesion approaches the left aspect of the sigmoid colon, however no direct continuity with the sigmoid colon is demonstrated. No significant change in recent PET CT. 2. Unchanged 13 mm low-density lesion in the inferior right lobe of the liver, suspicious for metastatic disease. 3. Cholelithiasis without cholecystitis. 4. Similar distal esophageal wall thickening, known esophageal malignancy. Electronically Signed   By: Keith Rake M.D.   On: 02/21/2021 16:43   MR SACRUM SI JOINTS W WO CONTRAST  Result Date: 02/21/2021 CLINICAL DATA:  Esophageal cancer with metastatic disease. EXAM: MRI SACRUM WITH CONTRAST TECHNIQUE: Multiplanar multi-sequence MR imaging of the sacrum was performed. CONTRAST:  7 cc Gadavist COMPARISON:  CT earlier same day. FINDINGS: 8 x 10 mm centrally necrotic metastasis affecting the sacrum and surrounding soft tissues. This affects the S1, S2, S3 and upper S4 segments and is more extensive to the left of midline than the right. Tumor fills the sacral spinal canal and extends at least as far as the L5-S1 disc space. All sacral neural foramina are involved. Tumor extends into the presacral space and also extends dorsal to the sacrum to the left of midline. Pathologic fracture at the S2 segment with about 4 mm of  offset. Tumor crosses the left sacroiliac joint and involves the left iliac bone. Tumor extends into the sacral hiatus on the left. IMPRESSION: Large metastasis affecting the sacrum with central necrosis. Extensive involvement of the S1, S2 and S3 segments with lesser involvement of upper S4. Lesion is more extensive to the left of midline but does involve all sacral foramina. Tumor fills the sacral spinal canal and extends up to the L5-S1 disc level. Tumor extends into the sacral hiatus on the left. Pathologic fracture at S2 with about 4 mm of offset. Crossing of the left SI joint with involvement of the iliac. Electronically Signed   By: Nelson Chimes M.D.   On: 02/21/2021 20:18   NM PET Image Restag (PS) Skull Base To Thigh  Addendum Date: 02/21/2021   ADDENDUM REPORT: 02/21/2021 17:11 ADDENDUM: Upon re-review of PET-CT performed February 12, 2021, the following findings are noted: Within the inferior right lobe of the liver there is a subtle hypodense 9 mm lesion on image 126/4 which  demonstrates hypermetabolic activity with a max SUV of 3.93, suspicious for metastatic disease. These results will be called to the ordering clinician or representative by the Radiologist Assistant, and communication documented in the PACS or [employer]. Electronically Signed   By: Dahlia Bailiff M.D.   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 EXAM: NUCLEAR MEDICINE PET SKULL BASE TO THIGH TECHNIQUE: 8.15 mCi F-18 FDG was injected intravenously. Full-ring PET imaging was performed from the skull base to thigh after the radiotracer. CT data was obtained and used for attenuation correction and anatomic localization. Fasting blood glucose: 103 mg/dl COMPARISON:  Multiple priors including most recent PET-CT November 08, 2020 FINDINGS: Mediastinal blood pool activity: SUV max 1.42 Liver activity: SUV  max NA NECK: No hypermetabolic lymph nodes in the neck. Incidental CT findings: none CHEST: Similar chronic wall thickening involving the mid to distal esophagus with max SUV of 3.85 previously 6.2. Also similar prior is indistinctness of the tissue planes adjacent to the affected segment of the esophagus. No hypermetabolic thoracic adenopathy identified. Incidental CT findings: Stable small pericardial effusion. Right chest Port-A-Cath with tip in the right atrium. ABDOMEN/PELVIS: No abnormal hypermetabolic activity within the liver, pancreas, adrenal glands, or spleen. No hypermetabolic lymph nodes in the abdomen or pelvis. Incidental CT findings: Cholelithiasis without evidence of acute cholecystitis. Unremarkable noncontrast appearance of the hepatic, pancreatic and splenic parenchyma. No hydronephrosis. No evidence of bowel obstruction. Normal appendix. Mild circumferential wall thickening of an incompletely distended urinary bladder appears similar prior. SKELETON: Increase in size of the large hypermetabolic centrally necrotic osseous metastatic lesion centered in the left hemi sacrum with erosion through the left SI joint and increased erosion into the sacrum and sacral spinal canal measuring 9.7 x 7.8 cm with a max SUV of 9.63 previously 7.7 x 6.4 cm with a max SUV of 9.8. Post radiation change in the mid/lower thoracic spine. Extensive hypermetabolic marrow activity involving the axial and proximal appendicular skeleton without new/additional suspicious osseous lesion, favored to reflect marrow stimulation. Incidental CT findings: none IMPRESSION: 1. Increase in size of the peripherally hypermetabolic centrally necrotic metastatic lesion centered in the left hemisacrum. 2. Similar mild metabolic activity associated with the thickened segment of mid to distal esophagus which corresponds with the original mass and is favored to reflect posttreatment change. 3. Extensive hypermetabolic marrow activity  involving the axial and proximal appendicular skeleton without new/additional suspicious osseous lesion, favored to reflect marrow stimulation. Electronically Signed: By: Dahlia Bailiff M.D. On: 02/13/2021 09:16   EEG adult  Result Date: 02/22/2021 Lora Havens, MD     02/22/2021 12:14 PM Patient Name: Eliab Closson MRN: [id number] Epilepsy Attending: Lora Havens Referring Physician/Provider: Elodia Florence., MD Date: 02/22/2021 Duration: 22.11 mins Patient history: 46 year old male with malignant neoplasm of esophagus, metastatic disease to bone who presented with altered mental status and jerking.  EEG to evaluate for seizure. Level of alertness: Awake AEDs during EEG study: Ativan Technical aspects: This EEG study was done with scalp electrodes positioned according to the 10-20 International system of electrode placement. Electrical activity was acquired at a sampling rate of _0  and reviewed with a high frequency filter of _1  and a low frequency filter of _2 . EEG data were recorded continuously and digitally stored. Description: The posterior dominant rhythm consists of 9 Hz activity of moderate voltage (25-35 uV) seen predominantly in posterior head  regions, symmetric and reactive to eye opening and eye closing. EEG also showed continuous generalized 3 to 6 Hz theta-delta slowing as well as 15 to 18 Hz beta activity in frontocentral region. Patient was noted to have multiple episodes of brief whole-body spontaneous, nonrhythmic jerking.  Concomitant EEG before, during and after the event did not show any EEG to suggest seizure. Hyperventilation and photic stimulation were not performed.   ABNORMALITY - Continuous slow, generalized IMPRESSION: This study is suggestive of moderate diffuse encephalopathy, nonspecific etiology. Patient was noted to have multiple episodes of brief whole body spontaneous, nonrhythmic jerking without concomitant EEG change.  These episodes were  Non-epileptic. No seizures or epileptiform discharges were seen throughout the recording. Lora Havens    Microbiology: Results for orders placed or performed during the hospital encounter of 03/11/21  Resp Panel by RT-PCR (Flu A&B, Covid) Nasopharyngeal Swab     Status: None   Collection Time: 03/11/21  4:30 PM   Specimen: Nasopharyngeal Swab; Nasopharyngeal(NP) swabs in vial transport medium  Result Value Ref Range Status   SARS Coronavirus 2 by RT PCR NEGATIVE NEGATIVE Final    Comment: (NOTE) SARS-CoV-2 target nucleic acids are NOT DETECTED.  The SARS-CoV-2 RNA is generally detectable in upper respiratory specimens during the acute phase of infection. The lowest concentration of SARS-CoV-2 viral copies this assay can detect is 138 copies/mL. A negative result does not preclude SARS-Cov-2 infection and should not be used as the sole basis for treatment or other patient management decisions. A negative result may occur with  improper specimen collection/handling, submission of specimen other than nasopharyngeal swab, presence of viral mutation(s) within the areas targeted by this assay, and inadequate number of viral copies(<138 copies/mL). A negative result must be combined with clinical observations, patient history, and epidemiological information. The expected result is Negative.  Fact Sheet for Patients:  EntrepreneurPulse.com.au  Fact Sheet for Healthcare Providers:  IncredibleEmployment.be  This test is no t yet approved or cleared by the [place] FDA and  has been authorized for detection and/or diagnosis of SARS-CoV-2 by FDA under an Emergency Use Authorization (EUA). This EUA will remain  in effect (meaning this test can be used) for the duration of the COVID-19 declaration under Section 564(b)(1) of the Act, 21 U.S.C.section 360bbb-3(b)(1), unless the authorization is terminated  or revoked sooner.       Influenza A by  PCR NEGATIVE NEGATIVE Final   Influenza B by PCR NEGATIVE NEGATIVE Final    Comment: (NOTE) The Xpert Xpress SARS-CoV-2/FLU/RSV plus assay is intended as an aid in the diagnosis of influenza from Nasopharyngeal swab specimens and should not be used as a sole basis for treatment. Nasal washings and aspirates are unacceptable for Xpert Xpress SARS-CoV-2/FLU/RSV testing.  Fact Sheet for Patients: EntrepreneurPulse.com.au  Fact Sheet for Healthcare Providers: IncredibleEmployment.be  This test is not yet approved or cleared by the [place] FDA and has been authorized for detection and/or diagnosis of SARS-CoV-2 by FDA under an Emergency Use Authorization (EUA). This EUA will remain in effect (meaning this test can be used) for the duration of the COVID-19 declaration under Section 564(b)(1) of the Act, 21 U.S.C. section 360bbb-3(b)(1), unless the authorization is terminated or revoked.  Performed at [facility], [place] [address], [place], [place] [address]     Labs: CBC: Recent Labs  Lab 03/12/21 0521  WBC 5.7  HGB 11.0*  HCT 34.1*  MCV 97.7  PLT 825*   Basic Metabolic Panel: Recent Labs  Lab 03/12/21 0521  NA 133*  K 3.7  CL 101  CO2 26  GLUCOSE 146*  BUN 18  CREATININE 0.65  CALCIUM 7.8*   Liver Function Tests: No results for input(s): AST, ALT, ALKPHOS, BILITOT, PROT, ALBUMIN in the last 168 hours. CBG: No results for input(s): GLUCAP in the last 168 hours.  Discharge time spent: greater than 30 minutes.  Signed: Florencia Reasons, MD PhD FACP [facility] 03/13/2021

## 2021-03-13 NOTE — Discharge Planning (Signed)
Patients port deaccessed and patient picked up via emr for home with hopsice care, all paperwork given to hospice and patient transported out

## 2021-03-13 NOTE — TOC Transition Note (Signed)
Transition of Care [facility]) - CM/SW Discharge Note   Patient Details  Name: Jimmy Hawkins MRN: [id number] Date of Birth: 11-13-1975  [facility] CM/SW Contact:  Lynnell Catalan, RN Phone Number: 03/13/2021, 1:01 PM   Clinical Narrative:     Pt to dc back home with [facility] home hospice. Non-emergent EMS contacted for transport home. Yellow DNR on the chart for transport. Pt to receive his scheduled radiation treatment at 2:15 today and ambulance transport scheduled for after 3:30pm. RN and family aware.

## 2021-03-13 NOTE — Progress Notes (Incomplete)
[place]  [phone]) [phone] [phone]) [phone]   Name: Buel Molder Date: 03/13/2021 MRN: [id number]  DOB: [birth year]  Patient Care Team: [name], MD as PCP - General (Hematology) [name], MD as Consulting Physician (Oncology) Pickenpack-Cousar, Carlena Sax, NP as Nurse Practitioner (Nurse Practitioner)    INTERVAL HISTORY: Nicholad Kautzman is a 46 y.o. male with   SOCIAL HISTORY:     reports that he has never smoked. He has never used smokeless tobacco. He reports current alcohol use of about 30.0 standard drinks per week. He reports that he does not use drugs.  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  MEDICATIONS:  Current Facility-Administered Medications  Medication Dose Route Frequency Provider Last Rate Last Admin   acetaminophen (TYLENOL) tablet 650 mg  650 mg Oral Q6H PRN Regalado, Belkys A, MD       Or   acetaminophen (TYLENOL) suppository 650 mg  650 mg Rectal Q6H PRN Regalado, Belkys A, MD       dexamethasone (DECADRON) tablet 4 mg  4 mg Oral TID Regalado, Belkys A, MD   4 mg at 03/13/21 1536   enoxaparin (LOVENOX) injection 40 mg  40 mg Subcutaneous Q24H Regalado, Belkys A, MD   40 mg at 03/13/21 1548   gabapentin (NEURONTIN) capsule 300 mg  300 mg Oral TID Regalado, Belkys A, MD   300 mg at 03/13/21 1537   HYDROmorphone (DILAUDID) injection 1 mg  1 mg Intravenous Q3H PRN Regalado, Belkys A, MD   1 mg at 03/12/21 0948   HYDROmorphone (DILAUDID) tablet 4-6 mg  4-6 mg Oral Q3H PRN Regalado, Belkys A, MD   4 mg at 03/13/21 1257   LORazepam (ATIVAN) tablet 0.5-1 mg  0.5-1 mg Oral  Q4H PRN Pickenpack-Cousar, Carlena Sax, NP       magic mouthwash w/lidocaine  5 mL Oral TID Regalado, Belkys A, MD   5 mL at 03/13/21 1257   methocarbamol (ROBAXIN) 500 mg in dextrose 5 % 50 mL IVPB  500 mg Intravenous Q6H PRN Regalado, Belkys A, MD       morphine CONCENTRATE 10 MG/0.5ML oral solution 10-20 mg  10-20 mg Oral Q1H PRN Pickenpack-Cousar, Orvis Stann N, NP       ondansetron (ZOFRAN) tablet 4 mg  4 mg Oral Q6H PRN Regalado, Belkys A, MD       Or   ondansetron (ZOFRAN) injection 4 mg  4 mg Intravenous Q6H PRN Regalado, Belkys A, MD       oxyCODONE (OXYCONTIN) 12 hr tablet 40 mg  40 mg Oral Q8H Pickenpack-Cousar, Morton Simson N, NP   40 mg at 03/13/21 1403   pantoprazole (PROTONIX) EC tablet 40 mg  40 mg Oral BID Regalado, Belkys A, MD   40 mg at 03/13/21 0849   polyethylene glycol (MIRALAX / GLYCOLAX) packet 17 g  17 g Oral BID Regalado, Belkys A, MD   17 g at 03/13/21 0851   prochlorperazine (COMPAZINE) tablet 10 mg  10 mg Oral Q6H PRN Regalado, Belkys A, MD       senna-docusate (Senokot-S) tablet 1 tablet  1 tablet Oral Daily Regalado, Belkys A, MD  1 tablet at 03/13/21 0849   sucralfate (CARAFATE) tablet 1 g  1 g Oral QID Regalado, Belkys A, MD   1 g at 03/13/21 1403   tamsulosin (FLOMAX) capsule 0.4 mg  0.4 mg Oral QPC breakfast Regalado, Belkys A, MD   0.4 mg at 03/13/21 0849   traZODone (DESYREL) tablet 50 mg  50 mg Oral QHS Regalado, Belkys A, MD   50 mg at 03/12/21 2105   Facility-Administered Medications Ordered in Other Encounters  Medication Dose Route Frequency Provider Last Rate Last Admin   diphenhydrAMINE (BENADRYL) 50 MG/ML injection            diphenhydrAMINE (BENADRYL) 50 MG/ML injection            famotidine (PEPCID) 20 mg IVPB            famotidine (PEPCID) 20 mg IVPB            palonosetron (ALOXI) 0.25 MG/5ML injection            palonosetron (ALOXI) 0.25 MG/5ML injection             VITAL SIGNS: BP 102/68 (BP Location: Right Arm)    Pulse 84    Temp 97.9 F (36.6 C)  (Oral)    Resp 16    Ht 6' (1.829 m)    Wt 69.1 kg    SpO2 99%    BMI 20.67 kg/m  Filed Weights   03/12/21 1053  Weight: 69.1 kg    Estimated body mass index is 20.67 kg/m as calculated from the following:   Height as of this encounter: 6' (1.829 m).   Weight as of this encounter: 69.1 kg.   PERFORMANCE STATUS (ECOG) : {CHL ONC ECOG Q3448304   Physical Exam General: NAD Cardiovascular: regular rate and rhythm Pulmonary: clear ant fields Abdomen: soft, nontender, + bowel sounds GU: no suprapubic tenderness Extremities: no edema, no joint deformities Skin: no rashes Neurological: Weakness but otherwise nonfocal  IMPRESSION: ***  We discussed Her current illness and what it means in the larger context of Her on-going co-morbidities. Natural disease trajectory and expectations were discussed.  I discussed the importance of continued conversation with family and their medical providers regarding overall plan of care and treatment options, ensuring decisions are within the context of the patients values and GOCs.  PLAN: ***   Patient expressed understanding and was in agreement with this plan. He also understands that He can call the clinic at any time with any questions, concerns, or complaints.     Time Total: ***  Visit consisted of counseling and education dealing with the complex and emotionally intense issues of symptom management and palliative care in the setting of serious and potentially life-threatening illness.Greater than 50%  of this time was spent counseling and coordinating care related to the above assessment and plan.  Signed by: Alda Lea, AGPCNP-BC [place]

## 2021-03-14 ENCOUNTER — Ambulatory Visit: Payer: Self-pay

## 2021-03-14 ENCOUNTER — Encounter: Payer: Self-pay | Admitting: Student

## 2021-03-14 NOTE — Progress Notes (Signed)
° °  Palliative Medicine Inpatient Follow Up Note     Chart Reviewed. Patient assessed at the bedside.   [name] is awake and alert in bed. His niece and sister are at the bedside. Pain well controlled. Reports he had a good night. Is tolerating oral regimen. He is asking about discharging home today.   Discussed at length with patient and family plans for ongoing pain management at home with hospice support. Family aware if pain becomes unmanaged to contact the hospice team for further support and evaluation.   They are aware we are available for support if needed.   Appreciative of support. All questions answered.    Objective Assessment: Vital Signs Vitals:   03/13/21 0530 03/13/21 1240  BP: 108/73 102/68  Pulse: 67 84  Resp: 20 16  Temp: 97.9 F (36.6 C) 97.9 F (36.6 C)  SpO2: 100% 99%    Intake/Output Summary (Last 24 hours) at 03/14/2021 1047 Last data filed at 03/13/2021 1633 Gross per 24 hour  Intake --  Output 350 ml  Net -350 ml   Last Weight  Most recent update: 03/12/2021 10:53 AM    Weight  69.1 kg (152 lb 6 oz)            Gen:  NAD HEENT: moist mucous membranes CV: Regular rate and rhythm, no murmurs rubs or gallops PULM: clear to auscultation bilaterally. No wheezes/rales/rhonchi ABD: soft/nontender/nondistended/normal bowel sounds EXT: No edema Neuro: Alert and oriented x3 with interpretation  SUMMARY OF RECOMMENDATIONS   Continue with current plan of care per medical team  Patient discharging home today with [facility] in-home hospice support. PMT will continue to support and follow on as needed basis. Please secure chat for urgent needs.   Symptom Management:  Pain, cancer related:  OxyContin 40 mg 3 times daily (increased from 30 mg).   Gabapentin 300 mg 3 times daily  IV dilaudid 1mg  every 3hours as needed for breakthrough pain (patient has received 2 mg over past 24 hours) Dilaudid 4-6 mg every 3 hours as needed for  breakthrough pain (has received 4 doses of 4mg  over past 24 hours)  Roxanol 10-20 mg every hour as needed for breakthrough pain in addition to oral Dilaudid in the setting of hospice patient.  (Has not received any doses since admission)    Constipation MiraLAX 2 times daily Senna S daily Anxiety Ativan 0.5-1 mg every 4 hours as needed for anxiety  Discussed with Dr. Erlinda Hong   Time Total: 20 min.   Visit consisted of counseling and education dealing with the complex and emotionally intense issues of symptom management and palliative care in the setting of serious and potentially life-threatening illness.Greater than 50%  of this time was spent counseling and coordinating care related to the above assessment and plan.  Alda Lea, AGPCNP-BC  Palliative Medicine Team [phone]  Palliative Medicine Team providers are available by phone from 7am to 7pm daily and can be reached through the team cell phone. Should this patient require assistance outside of these hours, please call the patient's attending physician.

## 2021-03-15 ENCOUNTER — Inpatient Hospital Stay: Payer: Self-pay | Admitting: Hematology

## 2021-03-15 ENCOUNTER — Inpatient Hospital Stay: Payer: Self-pay

## 2021-03-15 ENCOUNTER — Ambulatory Visit: Payer: Self-pay

## 2021-03-18 ENCOUNTER — Inpatient Hospital Stay: Payer: Self-pay

## 2021-03-19 ENCOUNTER — Encounter: Payer: Self-pay | Admitting: Hematology

## 2021-03-19 ENCOUNTER — Encounter: Payer: Self-pay | Admitting: Physician Assistant

## 2021-03-19 NOTE — Progress Notes (Signed)
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  Narrative: The patient's status declined during his course of radiation, and his radiotherapy was discontinued after 12 treatments and he was discharged from the hospital with hospice care.  Plan: We will follow up as needed as he is now enrolled in hospice care.     Carola Rhine, PAC

## 2021-04-30 ENCOUNTER — Other Ambulatory Visit (HOSPITAL_COMMUNITY): Payer: Self-pay

## 2021-04-30 ENCOUNTER — Encounter: Payer: Self-pay | Admitting: Hematology

## 2021-04-30 ENCOUNTER — Encounter: Payer: Self-pay | Admitting: Physician Assistant

## 2021-04-30 MED ORDER — HALOPERIDOL 5 MG PO TABS
ORAL_TABLET | ORAL | 0 refills | Status: AC
  Filled 2021-04-30: qty 60, 30d supply, fill #0

## 2021-05-10 ENCOUNTER — Other Ambulatory Visit (HOSPITAL_COMMUNITY): Payer: Self-pay

## 2021-05-20 ENCOUNTER — Other Ambulatory Visit: Payer: Self-pay

## 2021-05-27 DEATH — deceased

## 2022-06-06 IMAGING — XA IR IMAGING GUIDED PORT INSERTION
1 series · 2 of 2 positions shown · non-contrast
Comparison: none

INDICATION: Esophageal malignancy

[Series 1: ir imaging guided port insertion · 2 of 2 frames shown]
[frame 1/2]
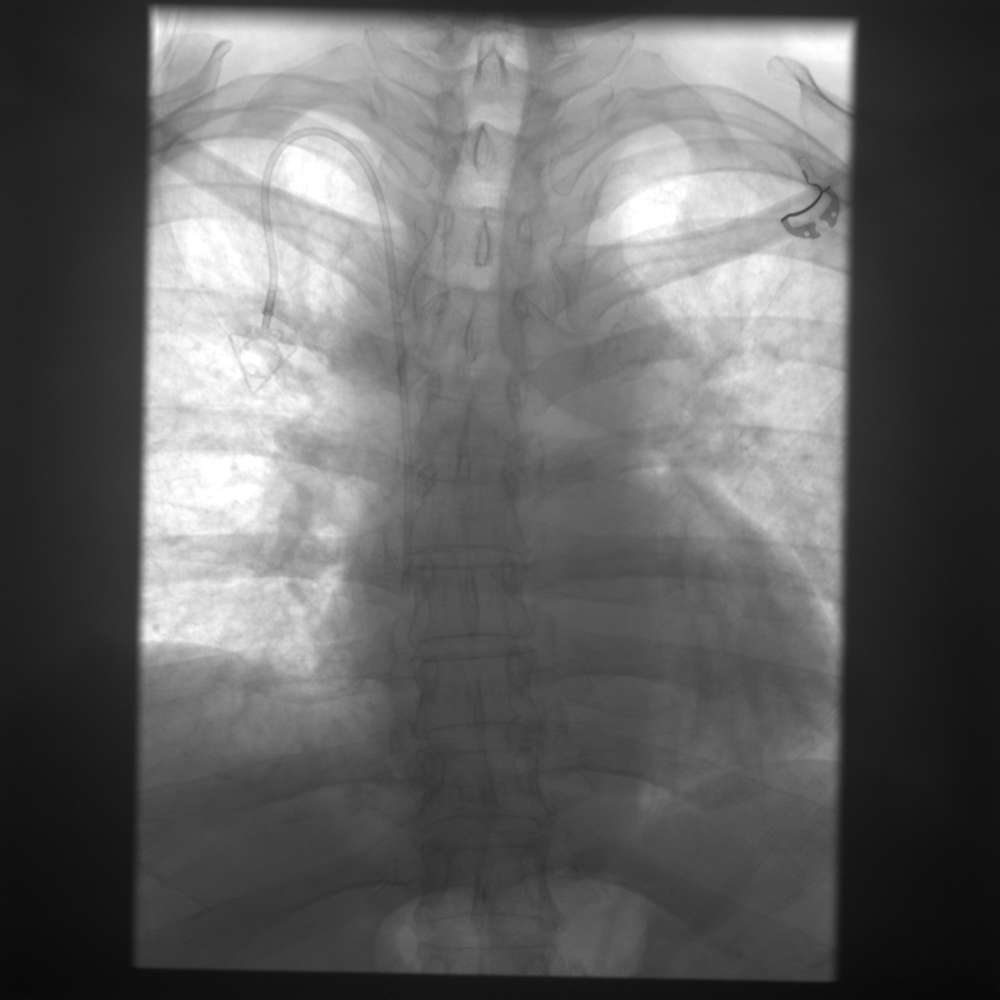
[frame 2/2]
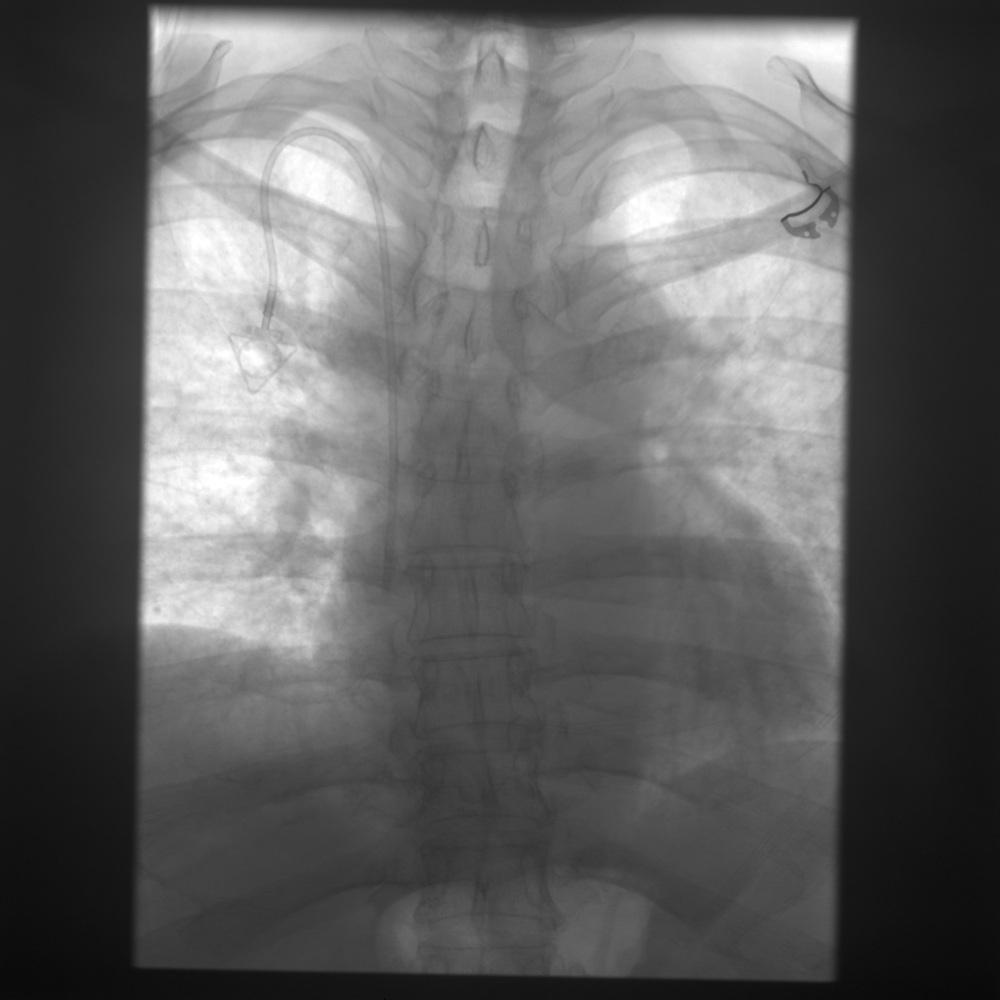

[2 of 2 positions shown; findings below may reference images not displayed]

EXAM:
IMPLANTED PORT A CATH PLACEMENT WITH ULTRASOUND AND FLUOROSCOPIC
GUIDANCE

MEDICATIONS:
None

ANESTHESIA/SEDATION:
Moderate (conscious) sedation was employed during this procedure. A
total of Versed 4 mg and Fentanyl 100 mcg was administered
intravenously.

Moderate Sedation Time: 20 minutes. The patient's level of
consciousness and vital signs were monitored continuously by
radiology nursing throughout the procedure under my direct
supervision.

FLUOROSCOPY TIME:  Fifty-four seconds (3 mGy)

COMPLICATIONS:
None immediate.

PROCEDURE:
The procedure, risks, benefits, and alternatives were explained to
the patient. Questions regarding the procedure were encouraged and
answered. The patient understands and consents to the procedure.

A timeout was performed prior to the initiation of the procedure.

Patient positioned supine on the angiography table.

Right neck and anterior upper chest prepped and draped in the usual
sterile fashion. All elements of maximal sterile barrier were
utilized including, cap, mask, sterile gown, sterile gloves, large
sterile drape, hand scrubbing and 2% Chlorhexidine for skin
cleaning.

The right internal jugular vein was evaluated with ultrasound and
shown to be patent. A permanent ultrasound image was obtained and
placed in the patient's medical record. Local anesthesia was
provided with 1% lidocaine with epinephrine.

Using sterile gel and a sterile probe cover, the right internal
jugular vein was entered with a 21 ga needle during real time
ultrasound guidance.

0.018 inch guidewire placed and 21 ga needle exchanged for
transitional dilator set. Utilizing fluoroscopy, 0.035 inch
guidewire advanced through the needle without difficulty.

Attention then turned to the right anterior upper chest. Following
local lidocaine administration, a port pocket was created. The
catheter was connected to the port and brought from the pocket to
the venotomy site through a subcutaneous tunnel.

The catheter was cut to size and inserted through the peel-away
sheath. The catheter tip was positioned at the cavoatrial junction
using fluoroscopic guidance.

The port aspirated and flushed well. The port pocket was closed with
deep and superficial absorbable suture. The port pocket incision and
venotomy sites were also sealed with Dermabond.
IMPRESSION: Successful placement of a right internal jugular approach power
injectable Port-A-Cath. The catheter is ready for immediate use.

## 2022-10-03 NOTE — Progress Notes (Signed)
This encounter was created in error - please disregard.

## 2022-12-17 NOTE — Telephone Encounter (Signed)
Telephone call
